# Patient Record
Sex: Female | Born: 1935 | Race: Black or African American | Hispanic: No | Smoking: Never smoker
Health system: Southern US, Community
[De-identification: ages and names within clinical notes are randomized; demographics above are authoritative.]

## PROBLEM LIST (undated history)

## (undated) DIAGNOSIS — I10 Essential (primary) hypertension: Secondary | ICD-10-CM

## (undated) DIAGNOSIS — E119 Type 2 diabetes mellitus without complications: Secondary | ICD-10-CM

## (undated) DIAGNOSIS — F0390 Unspecified dementia without behavioral disturbance: Secondary | ICD-10-CM

## (undated) HISTORY — PX: THYROIDECTOMY: SHX17

---

## 2005-06-16 ENCOUNTER — Ambulatory Visit: Payer: Self-pay | Admitting: Internal Medicine

## 2008-02-28 ENCOUNTER — Ambulatory Visit: Payer: Self-pay | Admitting: Internal Medicine

## 2008-11-13 ENCOUNTER — Ambulatory Visit: Payer: Self-pay | Admitting: Internal Medicine

## 2008-11-24 ENCOUNTER — Ambulatory Visit: Payer: Self-pay | Admitting: Unknown Physician Specialty

## 2009-03-03 ENCOUNTER — Ambulatory Visit: Payer: Self-pay | Admitting: Internal Medicine

## 2010-04-21 ENCOUNTER — Ambulatory Visit: Payer: Self-pay | Admitting: Internal Medicine

## 2011-05-26 ENCOUNTER — Ambulatory Visit: Payer: Self-pay | Admitting: Internal Medicine

## 2011-10-21 ENCOUNTER — Emergency Department: Payer: Self-pay | Admitting: Emergency Medicine

## 2012-02-27 ENCOUNTER — Emergency Department: Payer: Self-pay | Admitting: Emergency Medicine

## 2012-02-27 LAB — URINALYSIS, COMPLETE
Bilirubin,UR: NEGATIVE
Ketone: NEGATIVE
Nitrite: NEGATIVE
Protein: NEGATIVE
RBC,UR: 1 /HPF (ref 0–5)
Specific Gravity: 1.003 (ref 1.003–1.030)
Squamous Epithelial: <1
WBC UR: 5 /HPF (ref 0–5)

## 2012-06-20 ENCOUNTER — Ambulatory Visit: Payer: Self-pay

## 2014-06-30 ENCOUNTER — Emergency Department: Payer: Self-pay | Admitting: Emergency Medicine

## 2014-06-30 LAB — CBC WITH DIFFERENTIAL/PLATELET
Basophil #: 0 10*3/uL (ref 0.0–0.1)
Basophil %: 0.8 %
Eosinophil #: 0.1 10*3/uL (ref 0.0–0.7)
Eosinophil %: 2.5 %
HCT: 39 % (ref 35.0–47.0)
HGB: 12.7 g/dL (ref 12.0–16.0)
LYMPHS ABS: 2.9 10*3/uL (ref 1.0–3.6)
Lymphocyte %: 54.9 %
MCH: 28.8 pg (ref 26.0–34.0)
MCHC: 32.5 g/dL (ref 32.0–36.0)
MCV: 89 fL (ref 80–100)
MONOS PCT: 7.6 %
Monocyte #: 0.4 x10 3/mm (ref 0.2–0.9)
NEUTROS ABS: 1.8 10*3/uL (ref 1.4–6.5)
Neutrophil %: 34.2 %
Platelet: 252 10*3/uL (ref 150–440)
RBC: 4.41 10*6/uL (ref 3.80–5.20)
RDW: 13.9 % (ref 11.5–14.5)
WBC: 5.3 10*3/uL (ref 3.6–11.0)

## 2014-06-30 LAB — URINALYSIS, COMPLETE
BACTERIA: NONE SEEN
Bilirubin,UR: NEGATIVE
GLUCOSE, UR: NEGATIVE mg/dL (ref 0–75)
Ketone: NEGATIVE
Nitrite: NEGATIVE
Ph: 6 (ref 4.5–8.0)
Protein: NEGATIVE
RBC,UR: 1 /HPF (ref 0–5)
SPECIFIC GRAVITY: 1.011 (ref 1.003–1.030)

## 2014-06-30 LAB — BASIC METABOLIC PANEL
Anion Gap: 5 — ABNORMAL LOW (ref 7–16)
BUN: 13 mg/dL (ref 7–18)
CHLORIDE: 106 mmol/L (ref 98–107)
CO2: 28 mmol/L (ref 21–32)
CREATININE: 0.64 mg/dL (ref 0.60–1.30)
Calcium, Total: 9.1 mg/dL (ref 8.5–10.1)
Glucose: 153 mg/dL — ABNORMAL HIGH (ref 65–99)
Osmolality: 281 (ref 275–301)
Potassium: 4.1 mmol/L (ref 3.5–5.1)
Sodium: 139 mmol/L (ref 136–145)

## 2015-04-19 ENCOUNTER — Encounter: Payer: Self-pay | Admitting: Emergency Medicine

## 2015-04-19 ENCOUNTER — Emergency Department: Payer: Commercial Managed Care - HMO

## 2015-04-19 ENCOUNTER — Emergency Department
Admission: EM | Admit: 2015-04-19 | Discharge: 2015-04-19 | Disposition: A | Discharge status: Home / Self Care | Payer: Commercial Managed Care - HMO | Attending: Emergency Medicine | Admitting: Emergency Medicine

## 2015-04-19 DIAGNOSIS — E1165 Type 2 diabetes mellitus with hyperglycemia: Secondary | ICD-10-CM

## 2015-04-19 DIAGNOSIS — Y998 Other external cause status: Secondary | ICD-10-CM | POA: Diagnosis not present

## 2015-04-19 DIAGNOSIS — W57XXXA Bitten or stung by nonvenomous insect and other nonvenomous arthropods, initial encounter: Secondary | ICD-10-CM | POA: Diagnosis not present

## 2015-04-19 DIAGNOSIS — S70262A Insect bite (nonvenomous), left hip, initial encounter: Secondary | ICD-10-CM | POA: Insufficient documentation

## 2015-04-19 DIAGNOSIS — F0390 Unspecified dementia without behavioral disturbance: Secondary | ICD-10-CM | POA: Diagnosis not present

## 2015-04-19 DIAGNOSIS — Z7951 Long term (current) use of inhaled steroids: Secondary | ICD-10-CM | POA: Insufficient documentation

## 2015-04-19 DIAGNOSIS — Y9289 Other specified places as the place of occurrence of the external cause: Secondary | ICD-10-CM | POA: Diagnosis not present

## 2015-04-19 DIAGNOSIS — Y9389 Activity, other specified: Secondary | ICD-10-CM | POA: Insufficient documentation

## 2015-04-19 DIAGNOSIS — Z79899 Other long term (current) drug therapy: Secondary | ICD-10-CM | POA: Insufficient documentation

## 2015-04-19 DIAGNOSIS — I10 Essential (primary) hypertension: Secondary | ICD-10-CM | POA: Insufficient documentation

## 2015-04-19 DIAGNOSIS — E119 Type 2 diabetes mellitus without complications: Secondary | ICD-10-CM | POA: Insufficient documentation

## 2015-04-19 DIAGNOSIS — IMO0002 Reserved for concepts with insufficient information to code with codable children: Secondary | ICD-10-CM

## 2015-04-19 DIAGNOSIS — R410 Disorientation, unspecified: Secondary | ICD-10-CM | POA: Diagnosis not present

## 2015-04-19 DIAGNOSIS — Z791 Long term (current) use of non-steroidal anti-inflammatories (NSAID): Secondary | ICD-10-CM | POA: Insufficient documentation

## 2015-04-19 HISTORY — DX: Essential (primary) hypertension: I10

## 2015-04-19 HISTORY — DX: Type 2 diabetes mellitus without complications: E11.9

## 2015-04-19 LAB — COMPREHENSIVE METABOLIC PANEL
ALT: 19 U/L (ref 14–54)
AST: 23 U/L (ref 15–41)
Albumin: 3.9 g/dL (ref 3.5–5.0)
Alkaline Phosphatase: 54 U/L (ref 38–126)
Anion gap: 6 (ref 5–15)
BILIRUBIN TOTAL: 0.6 mg/dL (ref 0.3–1.2)
BUN: 16 mg/dL (ref 6–20)
CO2: 28 mmol/L (ref 22–32)
CREATININE: 0.54 mg/dL (ref 0.44–1.00)
Calcium: 9.3 mg/dL (ref 8.9–10.3)
Chloride: 105 mmol/L (ref 101–111)
GFR calc Af Amer: >60 mL/min (ref >60)
GFR calc non Af Amer: >60 mL/min (ref >60)
GLUCOSE: 219 mg/dL — AB (ref 65–99)
Potassium: 3.9 mmol/L (ref 3.5–5.1)
SODIUM: 139 mmol/L (ref 135–145)
Total Protein: 7.3 g/dL (ref 6.5–8.1)

## 2015-04-19 LAB — URINALYSIS COMPLETE WITH MICROSCOPIC (ARMC ONLY)
BILIRUBIN URINE: NEGATIVE
Bacteria, UA: NONE SEEN
Ketones, ur: NEGATIVE mg/dL
Nitrite: NEGATIVE
Protein, ur: NEGATIVE mg/dL
SPECIFIC GRAVITY, URINE: 1.021 (ref 1.005–1.030)
pH: 6 (ref 5.0–8.0)

## 2015-04-19 LAB — CBC WITH DIFFERENTIAL/PLATELET
BASOS PCT: 1 %
Basophils Absolute: 0.1 10*3/uL (ref 0–0.1)
Eosinophils Absolute: 0.2 10*3/uL (ref 0–0.7)
Eosinophils Relative: 3 %
HCT: 37.4 % (ref 35.0–47.0)
Hemoglobin: 12.2 g/dL (ref 12.0–16.0)
LYMPHS PCT: 42 %
Lymphs Abs: 2.4 10*3/uL (ref 1.0–3.6)
MCH: 28.5 pg (ref 26.0–34.0)
MCHC: 32.7 g/dL (ref 32.0–36.0)
MCV: 87.2 fL (ref 80.0–100.0)
Monocytes Absolute: 0.4 10*3/uL (ref 0.2–0.9)
Monocytes Relative: 7 %
Neutro Abs: 2.6 10*3/uL (ref 1.4–6.5)
Neutrophils Relative %: 47 %
Platelets: 273 10*3/uL (ref 150–440)
RBC: 4.29 MIL/uL (ref 3.80–5.20)
RDW: 14 % (ref 11.5–14.5)
WBC: 5.6 10*3/uL (ref 3.6–11.0)

## 2015-04-19 LAB — CK: Total CK: 124 U/L (ref 38–234)

## 2015-04-19 LAB — LIPASE, BLOOD: Lipase: 37 U/L (ref 22–51)

## 2015-04-19 MED ORDER — METFORMIN HCL 500 MG PO TABS: 500.0000 mg | ORAL_TABLET | Freq: Two times a day (BID) | ORAL | Status: DC

## 2015-04-19 MED ORDER — HYDROCORTISONE 1 % EX OINT: 1.0000 "application " | TOPICAL_OINTMENT | Freq: Two times a day (BID) | CUTANEOUS | Status: DC

## 2015-04-19 MED ORDER — MELOXICAM 7.5 MG PO TABS: 7.5000 mg | ORAL_TABLET | Freq: Every day | ORAL | Status: DC

## 2015-04-19 NOTE — ED Provider Notes (Signed)
[facility] Emergency Department Provider Note ____________________________________________  Time seen: Approximately 5:10 PM  I have reviewed the triage vital signs and the nursing notes.   HISTORY  Chief Complaint Insect Bite and Weakness   HPI Melanie Turner is a 81 y.o. female for evaluation of increase in forgetfulness, evaluation of blood sugar, and evaluation of a tick bite from 3-4 weeks ago. Her granddaughter is concerned that she is not taking any of her medication as prescribed and that her dementia is getting worse. Melanie Turner also complains of left hip pain that is stiff in the mornings and again at night.    Past Medical History  Diagnosis Date  . Diabetes mellitus without complication   . Hypertension     There are no active problems to display for this patient.   Past Surgical History  Procedure Laterality Date  . Thyroidectomy      Current Outpatient Rx  Name  Route  Sig  Dispense  Refill  . hydrocortisone 1 % ointment   Topical   Apply 1 application topically 2 (two) times daily.   30 g   0   . meloxicam (MOBIC) 7.5 MG tablet   Oral   Take 1 tablet (7.5 mg total) by mouth daily.   30 tablet   0   . metFORMIN (GLUCOPHAGE) 500 MG tablet   Oral   Take 1 tablet (500 mg total) by mouth 2 (two) times daily with a meal.   60 tablet   0     Allergies Review of patient's allergies indicates no known allergies.  History reviewed. No pertinent family history.  Social History History  Substance Use Topics  . Smoking status: Never Smoker   . Smokeless tobacco: Not on file  . Alcohol Use: Yes     Comment: occasional    Review of Systems Constitutional: No fever/chills Eyes: No visual changes. ENT: No sore throat. Cardiovascular: Denies chest pain. Respiratory: Denies shortness of breath. Gastrointestinal: No abdominal pain.  No nausea, no vomiting.  No diarrhea.  No constipation. Genitourinary: Negative for  dysuria. Musculoskeletal: Negative for back pain. Positive for left hip and leg pain. Skin: Negative for rash. Neurological: Negative for headaches, focal weakness or numbness.  10-point ROS otherwise negative.  ____________________________________________   PHYSICAL EXAM:  VITAL SIGNS: ED Triage Vitals  Enc Vitals Group     BP 04/19/15 1419 195/106 mmHg     Pulse Rate 04/19/15 1419 83     Resp 04/19/15 1419 20     Temp 04/19/15 1419 98.7 F (37.1 C)     Temp Source 04/19/15 1419 Oral     SpO2 04/19/15 1419 100 %     Weight --      Height 04/19/15 1419  (1.626 m)     Head Cir --      Peak Flow --      Pain Score 04/19/15 1649 0     Pain Loc --      Pain Edu? --      Excl. in GC? --     Constitutional: Alert and oriented, but unable to recall if she is currently taking any medications. She reports she is "forgetful."  Well appearing and in no acute distress. Eyes: Conjunctivae are normal. PERRL. EOMI. Head: Atraumatic. Nose: No congestion/rhinnorhea. Mouth/Throat: Mucous membranes are moist.  Oropharynx non-erythematous. Neck: No stridor.   Cardiovascular: Normal rate, regular rhythm. Grossly normal heart sounds.  Good peripheral circulation. Respiratory: Normal respiratory effort.  No retractions. Lungs CTAB. Gastrointestinal: Soft and nontender. No distention. No abdominal bruits. No CVA tenderness. Musculoskeletal: No lower extremity tenderness nor edema.  No joint effusions. Tender to palpation over left upper leg and hip without pinpoint bony tenderness. Atraumatic appearing. Neurologic:  Normal speech and language. No gross focal neurologic deficits are appreciated. Speech is normal. Ambulates with steady gait. Skin:  Skin is warm and dry. Small lesion with red center and mild induration present to left hip. No fluctuance or concern for cellulitis/infection. Psychiatric: Mood and affect are normal. Speech and behavior are normal. Flight of thoughts, but easily  redirected.  ____________________________________________   LABS (all labs ordered are listed, but only abnormal results are displayed)  Labs Reviewed  URINALYSIS COMPLETEWITH MICROSCOPIC ([facility] ONLY) - Abnormal; Notable for the following:    Color, Urine YELLOW (*)    APPearance CLEAR (*)    Glucose, UA >500 (*)    Hgb urine dipstick 1+ (*)    Leukocytes, UA TRACE (*)    Squamous Epithelial / LPF 0-5 (*)    All other components within normal limits  COMPREHENSIVE METABOLIC PANEL - Abnormal; Notable for the following:    Glucose, Bld 219 (*)    All other components within normal limits  CBC WITH DIFFERENTIAL/PLATELET  LIPASE, BLOOD  CK   ____________________________________________  EKG  ____________________________________________  RADIOLOGY  CT head negative for acute pathology. ____________________________________________   PROCEDURES  Procedure(s) performed: None  Critical Care performed: No  ____________________________________________   INITIAL IMPRESSION / ASSESSMENT AND PLAN / ED COURSE  Pertinent labs & imaging results that were available during my care of the patient were reviewed by me and considered in my medical decision making (see chart for details).  CT and labs discussed with patient and family. Patient was encouraged to take her medications as prescribed without skipping doses. Her family plan to help her re-establish a primary care provider on Monday and remind her to take her medications. They will also discuss dementia care/treatment. ____________________________________________   FINAL CLINICAL IMPRESSION(S) / ED DIAGNOSES  Final diagnoses:  Diabetes type 2, uncontrolled  Tick bite of hip, left, initial encounter  Dementia, without behavioral disturbance      Chinita Pester, FNP 04/19/15 1741  Governor Rooks, MD 04/20/15 1447

## 2015-04-19 NOTE — ED Notes (Signed)
Pt arrived via POV from home. Per grand-daughter patient removed tic 3-4 weeks ago and is unsure if all of it was out. Per family member, pt seems to "be in early dementia", and would like her diabetes to be addressed.

## 2015-05-06 DIAGNOSIS — E780 Pure hypercholesterolemia: Secondary | ICD-10-CM | POA: Diagnosis not present

## 2015-05-06 DIAGNOSIS — I10 Essential (primary) hypertension: Secondary | ICD-10-CM | POA: Diagnosis not present

## 2015-05-06 DIAGNOSIS — R413 Other amnesia: Secondary | ICD-10-CM | POA: Diagnosis not present

## 2015-05-06 DIAGNOSIS — E1122 Type 2 diabetes mellitus with diabetic chronic kidney disease: Secondary | ICD-10-CM | POA: Diagnosis not present

## 2015-05-08 DIAGNOSIS — E780 Pure hypercholesterolemia: Secondary | ICD-10-CM | POA: Diagnosis not present

## 2015-05-08 DIAGNOSIS — E1122 Type 2 diabetes mellitus with diabetic chronic kidney disease: Secondary | ICD-10-CM | POA: Diagnosis not present

## 2015-05-08 DIAGNOSIS — R413 Other amnesia: Secondary | ICD-10-CM | POA: Diagnosis not present

## 2015-05-08 DIAGNOSIS — N182 Chronic kidney disease, stage 2 (mild): Secondary | ICD-10-CM | POA: Diagnosis not present

## 2015-09-28 ENCOUNTER — Emergency Department: Payer: Commercial Managed Care - HMO

## 2015-09-28 ENCOUNTER — Encounter: Payer: Self-pay | Admitting: Medical Oncology

## 2015-09-28 ENCOUNTER — Emergency Department
Admission: EM | Admit: 2015-09-28 | Discharge: 2015-09-28 | Disposition: A | Discharge status: Home / Self Care | Payer: Commercial Managed Care - HMO | Attending: Emergency Medicine | Admitting: Emergency Medicine

## 2015-09-28 DIAGNOSIS — S30810A Abrasion of lower back and pelvis, initial encounter: Secondary | ICD-10-CM | POA: Diagnosis not present

## 2015-09-28 DIAGNOSIS — I10 Essential (primary) hypertension: Secondary | ICD-10-CM | POA: Diagnosis not present

## 2015-09-28 DIAGNOSIS — Y92828 Other wilderness area as the place of occurrence of the external cause: Secondary | ICD-10-CM | POA: Insufficient documentation

## 2015-09-28 DIAGNOSIS — S300XXA Contusion of lower back and pelvis, initial encounter: Secondary | ICD-10-CM | POA: Diagnosis not present

## 2015-09-28 DIAGNOSIS — Y998 Other external cause status: Secondary | ICD-10-CM | POA: Diagnosis not present

## 2015-09-28 DIAGNOSIS — S299XXA Unspecified injury of thorax, initial encounter: Secondary | ICD-10-CM | POA: Diagnosis not present

## 2015-09-28 DIAGNOSIS — S80812A Abrasion, left lower leg, initial encounter: Secondary | ICD-10-CM | POA: Insufficient documentation

## 2015-09-28 DIAGNOSIS — S199XXA Unspecified injury of neck, initial encounter: Secondary | ICD-10-CM | POA: Insufficient documentation

## 2015-09-28 DIAGNOSIS — T148XXA Other injury of unspecified body region, initial encounter: Secondary | ICD-10-CM

## 2015-09-28 DIAGNOSIS — S0990XA Unspecified injury of head, initial encounter: Secondary | ICD-10-CM | POA: Diagnosis not present

## 2015-09-28 DIAGNOSIS — M542 Cervicalgia: Secondary | ICD-10-CM | POA: Diagnosis not present

## 2015-09-28 DIAGNOSIS — E119 Type 2 diabetes mellitus without complications: Secondary | ICD-10-CM | POA: Insufficient documentation

## 2015-09-28 DIAGNOSIS — Y9389 Activity, other specified: Secondary | ICD-10-CM | POA: Diagnosis not present

## 2015-09-28 DIAGNOSIS — T148 Other injury of unspecified body region: Secondary | ICD-10-CM | POA: Insufficient documentation

## 2015-09-28 DIAGNOSIS — M25559 Pain in unspecified hip: Secondary | ICD-10-CM | POA: Diagnosis not present

## 2015-09-28 NOTE — Discharge Instructions (Signed)

## 2015-09-28 NOTE — ED Notes (Signed)
Patient is back from imaging. 
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
## 2016-03-23 DIAGNOSIS — R413 Other amnesia: Secondary | ICD-10-CM | POA: Diagnosis not present

## 2016-03-23 DIAGNOSIS — I10 Essential (primary) hypertension: Secondary | ICD-10-CM | POA: Diagnosis not present

## 2016-03-23 DIAGNOSIS — E7800 Pure hypercholesterolemia, unspecified: Secondary | ICD-10-CM | POA: Diagnosis not present

## 2016-03-23 DIAGNOSIS — N182 Chronic kidney disease, stage 2 (mild): Secondary | ICD-10-CM | POA: Diagnosis not present

## 2016-03-23 DIAGNOSIS — E1122 Type 2 diabetes mellitus with diabetic chronic kidney disease: Secondary | ICD-10-CM | POA: Diagnosis not present

## 2016-06-29 DIAGNOSIS — R413 Other amnesia: Secondary | ICD-10-CM | POA: Diagnosis not present

## 2016-06-29 DIAGNOSIS — E538 Deficiency of other specified B group vitamins: Secondary | ICD-10-CM | POA: Diagnosis not present

## 2016-06-29 DIAGNOSIS — I10 Essential (primary) hypertension: Secondary | ICD-10-CM | POA: Diagnosis not present

## 2016-06-29 DIAGNOSIS — Z23 Encounter for immunization: Secondary | ICD-10-CM | POA: Diagnosis not present

## 2016-06-29 DIAGNOSIS — E7800 Pure hypercholesterolemia, unspecified: Secondary | ICD-10-CM | POA: Diagnosis not present

## 2016-06-29 DIAGNOSIS — Z0000 Encounter for general adult medical examination without abnormal findings: Secondary | ICD-10-CM | POA: Diagnosis not present

## 2016-08-10 DIAGNOSIS — E538 Deficiency of other specified B group vitamins: Secondary | ICD-10-CM | POA: Diagnosis not present

## 2016-09-14 DIAGNOSIS — E538 Deficiency of other specified B group vitamins: Secondary | ICD-10-CM | POA: Diagnosis not present

## 2016-09-14 DIAGNOSIS — N182 Chronic kidney disease, stage 2 (mild): Secondary | ICD-10-CM | POA: Diagnosis not present

## 2016-09-14 DIAGNOSIS — R413 Other amnesia: Secondary | ICD-10-CM | POA: Diagnosis not present

## 2016-09-14 DIAGNOSIS — E1122 Type 2 diabetes mellitus with diabetic chronic kidney disease: Secondary | ICD-10-CM | POA: Diagnosis not present

## 2016-09-14 DIAGNOSIS — I10 Essential (primary) hypertension: Secondary | ICD-10-CM | POA: Diagnosis not present

## 2016-09-14 DIAGNOSIS — E7800 Pure hypercholesterolemia, unspecified: Secondary | ICD-10-CM | POA: Diagnosis not present

## 2016-11-24 DIAGNOSIS — M25561 Pain in right knee: Secondary | ICD-10-CM | POA: Diagnosis not present

## 2016-11-24 DIAGNOSIS — E7800 Pure hypercholesterolemia, unspecified: Secondary | ICD-10-CM | POA: Diagnosis not present

## 2016-11-24 DIAGNOSIS — N182 Chronic kidney disease, stage 2 (mild): Secondary | ICD-10-CM | POA: Diagnosis not present

## 2016-11-24 DIAGNOSIS — E1122 Type 2 diabetes mellitus with diabetic chronic kidney disease: Secondary | ICD-10-CM | POA: Diagnosis not present

## 2016-12-05 DIAGNOSIS — M25861 Other specified joint disorders, right knee: Secondary | ICD-10-CM | POA: Diagnosis not present

## 2016-12-05 DIAGNOSIS — M25561 Pain in right knee: Secondary | ICD-10-CM | POA: Diagnosis not present

## 2016-12-08 ENCOUNTER — Other Ambulatory Visit: Payer: Self-pay | Admitting: Orthopedic Surgery

## 2016-12-09 ENCOUNTER — Other Ambulatory Visit: Payer: Self-pay | Admitting: Orthopedic Surgery

## 2016-12-09 DIAGNOSIS — R2241 Localized swelling, mass and lump, right lower limb: Secondary | ICD-10-CM

## 2016-12-13 DIAGNOSIS — M25861 Other specified joint disorders, right knee: Secondary | ICD-10-CM | POA: Diagnosis not present

## 2016-12-20 ENCOUNTER — Ambulatory Visit
Admission: RE | Admit: 2016-12-20 | Discharge: 2016-12-20 | Disposition: A | Discharge status: Home / Self Care | Payer: Medicare HMO | Source: Ambulatory Visit | Attending: Orthopedic Surgery | Admitting: Orthopedic Surgery

## 2016-12-20 DIAGNOSIS — M25861 Other specified joint disorders, right knee: Secondary | ICD-10-CM | POA: Diagnosis not present

## 2016-12-20 DIAGNOSIS — R2241 Localized swelling, mass and lump, right lower limb: Secondary | ICD-10-CM

## 2016-12-20 DIAGNOSIS — M25461 Effusion, right knee: Secondary | ICD-10-CM | POA: Insufficient documentation

## 2016-12-20 MED ORDER — GADOBENATE DIMEGLUMINE 529 MG/ML IV SOLN
15.0000 mL | Freq: Once | INTRAVENOUS | Status: AC | PRN
  Administered 2016-12-20: 15 mL via INTRAVENOUS

## 2017-01-23 DIAGNOSIS — M1711 Unilateral primary osteoarthritis, right knee: Secondary | ICD-10-CM | POA: Diagnosis not present

## 2017-03-15 DIAGNOSIS — I10 Essential (primary) hypertension: Secondary | ICD-10-CM | POA: Diagnosis not present

## 2017-03-15 DIAGNOSIS — E1122 Type 2 diabetes mellitus with diabetic chronic kidney disease: Secondary | ICD-10-CM | POA: Diagnosis not present

## 2017-03-15 DIAGNOSIS — N182 Chronic kidney disease, stage 2 (mild): Secondary | ICD-10-CM | POA: Diagnosis not present

## 2017-03-15 DIAGNOSIS — E7800 Pure hypercholesterolemia, unspecified: Secondary | ICD-10-CM | POA: Diagnosis not present

## 2017-03-15 DIAGNOSIS — E538 Deficiency of other specified B group vitamins: Secondary | ICD-10-CM | POA: Diagnosis not present

## 2017-06-29 DIAGNOSIS — E538 Deficiency of other specified B group vitamins: Secondary | ICD-10-CM | POA: Diagnosis not present

## 2017-06-29 DIAGNOSIS — E1122 Type 2 diabetes mellitus with diabetic chronic kidney disease: Secondary | ICD-10-CM | POA: Diagnosis not present

## 2017-06-29 DIAGNOSIS — F0390 Unspecified dementia without behavioral disturbance: Secondary | ICD-10-CM | POA: Diagnosis not present

## 2017-06-29 DIAGNOSIS — I10 Essential (primary) hypertension: Secondary | ICD-10-CM | POA: Diagnosis not present

## 2017-06-29 DIAGNOSIS — E7800 Pure hypercholesterolemia, unspecified: Secondary | ICD-10-CM | POA: Diagnosis not present

## 2017-06-29 DIAGNOSIS — N182 Chronic kidney disease, stage 2 (mild): Secondary | ICD-10-CM | POA: Diagnosis not present

## 2017-06-29 DIAGNOSIS — Z0000 Encounter for general adult medical examination without abnormal findings: Secondary | ICD-10-CM | POA: Diagnosis not present

## 2017-10-26 DIAGNOSIS — N182 Chronic kidney disease, stage 2 (mild): Secondary | ICD-10-CM | POA: Diagnosis not present

## 2017-10-26 DIAGNOSIS — F0390 Unspecified dementia without behavioral disturbance: Secondary | ICD-10-CM | POA: Diagnosis not present

## 2017-10-26 DIAGNOSIS — E538 Deficiency of other specified B group vitamins: Secondary | ICD-10-CM | POA: Diagnosis not present

## 2017-10-26 DIAGNOSIS — E1122 Type 2 diabetes mellitus with diabetic chronic kidney disease: Secondary | ICD-10-CM | POA: Diagnosis not present

## 2017-10-26 DIAGNOSIS — E7800 Pure hypercholesterolemia, unspecified: Secondary | ICD-10-CM | POA: Diagnosis not present

## 2017-10-26 DIAGNOSIS — I10 Essential (primary) hypertension: Secondary | ICD-10-CM | POA: Diagnosis not present

## 2018-02-22 DIAGNOSIS — I10 Essential (primary) hypertension: Secondary | ICD-10-CM | POA: Diagnosis not present

## 2018-02-22 DIAGNOSIS — F0390 Unspecified dementia without behavioral disturbance: Secondary | ICD-10-CM | POA: Diagnosis not present

## 2018-02-22 DIAGNOSIS — E441 Mild protein-calorie malnutrition: Secondary | ICD-10-CM | POA: Diagnosis not present

## 2018-02-22 DIAGNOSIS — E7800 Pure hypercholesterolemia, unspecified: Secondary | ICD-10-CM | POA: Diagnosis not present

## 2018-02-22 DIAGNOSIS — N182 Chronic kidney disease, stage 2 (mild): Secondary | ICD-10-CM | POA: Diagnosis not present

## 2018-02-22 DIAGNOSIS — E1122 Type 2 diabetes mellitus with diabetic chronic kidney disease: Secondary | ICD-10-CM | POA: Diagnosis not present

## 2018-06-26 DIAGNOSIS — I10 Essential (primary) hypertension: Secondary | ICD-10-CM | POA: Diagnosis not present

## 2018-06-26 DIAGNOSIS — N182 Chronic kidney disease, stage 2 (mild): Secondary | ICD-10-CM | POA: Diagnosis not present

## 2018-06-26 DIAGNOSIS — E441 Mild protein-calorie malnutrition: Secondary | ICD-10-CM | POA: Diagnosis not present

## 2018-06-26 DIAGNOSIS — E7800 Pure hypercholesterolemia, unspecified: Secondary | ICD-10-CM | POA: Diagnosis not present

## 2018-06-26 DIAGNOSIS — F0390 Unspecified dementia without behavioral disturbance: Secondary | ICD-10-CM | POA: Diagnosis not present

## 2018-06-26 DIAGNOSIS — Z0000 Encounter for general adult medical examination without abnormal findings: Secondary | ICD-10-CM | POA: Diagnosis not present

## 2018-06-26 DIAGNOSIS — E1122 Type 2 diabetes mellitus with diabetic chronic kidney disease: Secondary | ICD-10-CM | POA: Diagnosis not present

## 2018-06-26 DIAGNOSIS — Z9181 History of falling: Secondary | ICD-10-CM | POA: Diagnosis not present

## 2018-09-05 ENCOUNTER — Emergency Department
Admission: EM | Admit: 2018-09-05 | Discharge: 2018-09-05 | Disposition: A | Discharge status: Home / Self Care | Payer: Medicare HMO | Attending: Emergency Medicine | Admitting: Emergency Medicine

## 2018-09-05 ENCOUNTER — Emergency Department: Payer: Medicare HMO

## 2018-09-05 ENCOUNTER — Other Ambulatory Visit: Payer: Self-pay

## 2018-09-05 ENCOUNTER — Encounter: Payer: Self-pay | Admitting: Emergency Medicine

## 2018-09-05 DIAGNOSIS — S01112A Laceration without foreign body of left eyelid and periocular area, initial encounter: Secondary | ICD-10-CM | POA: Diagnosis not present

## 2018-09-05 DIAGNOSIS — I10 Essential (primary) hypertension: Secondary | ICD-10-CM | POA: Insufficient documentation

## 2018-09-05 DIAGNOSIS — Y92009 Unspecified place in unspecified non-institutional (private) residence as the place of occurrence of the external cause: Secondary | ICD-10-CM | POA: Insufficient documentation

## 2018-09-05 DIAGNOSIS — S0003XA Contusion of scalp, initial encounter: Secondary | ICD-10-CM | POA: Diagnosis not present

## 2018-09-05 DIAGNOSIS — S51012A Laceration without foreign body of left elbow, initial encounter: Secondary | ICD-10-CM | POA: Diagnosis not present

## 2018-09-05 DIAGNOSIS — W1830XA Fall on same level, unspecified, initial encounter: Secondary | ICD-10-CM | POA: Insufficient documentation

## 2018-09-05 DIAGNOSIS — S299XXA Unspecified injury of thorax, initial encounter: Secondary | ICD-10-CM | POA: Diagnosis not present

## 2018-09-05 DIAGNOSIS — S0012XA Contusion of left eyelid and periocular area, initial encounter: Secondary | ICD-10-CM | POA: Diagnosis not present

## 2018-09-05 DIAGNOSIS — Y939 Activity, unspecified: Secondary | ICD-10-CM | POA: Insufficient documentation

## 2018-09-05 DIAGNOSIS — R000 Tachycardia, unspecified: Secondary | ICD-10-CM | POA: Diagnosis not present

## 2018-09-05 DIAGNOSIS — W19XXXA Unspecified fall, initial encounter: Secondary | ICD-10-CM

## 2018-09-05 DIAGNOSIS — Z7984 Long term (current) use of oral hypoglycemic drugs: Secondary | ICD-10-CM | POA: Diagnosis not present

## 2018-09-05 DIAGNOSIS — Z79899 Other long term (current) drug therapy: Secondary | ICD-10-CM | POA: Insufficient documentation

## 2018-09-05 DIAGNOSIS — R410 Disorientation, unspecified: Secondary | ICD-10-CM | POA: Diagnosis not present

## 2018-09-05 DIAGNOSIS — S0990XA Unspecified injury of head, initial encounter: Secondary | ICD-10-CM | POA: Diagnosis not present

## 2018-09-05 DIAGNOSIS — R296 Repeated falls: Secondary | ICD-10-CM | POA: Diagnosis not present

## 2018-09-05 DIAGNOSIS — E119 Type 2 diabetes mellitus without complications: Secondary | ICD-10-CM | POA: Insufficient documentation

## 2018-09-05 DIAGNOSIS — R531 Weakness: Secondary | ICD-10-CM | POA: Diagnosis not present

## 2018-09-05 DIAGNOSIS — Y999 Unspecified external cause status: Secondary | ICD-10-CM | POA: Diagnosis not present

## 2018-09-05 DIAGNOSIS — S199XXA Unspecified injury of neck, initial encounter: Secondary | ICD-10-CM | POA: Diagnosis not present

## 2018-09-05 LAB — CBC
HCT: 37.3 % (ref 36.0–46.0)
Hemoglobin: 12.2 g/dL (ref 12.0–15.0)
MCH: 29 pg (ref 26.0–34.0)
MCHC: 32.7 g/dL (ref 30.0–36.0)
MCV: 88.8 fL (ref 80.0–100.0)
PLATELETS: 304 10*3/uL (ref 150–400)
RBC: 4.2 MIL/uL (ref 3.87–5.11)
RDW: 13.3 % (ref 11.5–15.5)
WBC: 8.3 10*3/uL (ref 4.0–10.5)
nRBC: 0 % (ref 0.0–0.2)

## 2018-09-05 LAB — URINALYSIS, COMPLETE (UACMP) WITH MICROSCOPIC
Bacteria, UA: NONE SEEN
Bilirubin Urine: NEGATIVE
GLUCOSE, UA: 50 mg/dL — AB
Ketones, ur: 5 mg/dL — AB
Leukocytes, UA: NEGATIVE
NITRITE: NEGATIVE
Protein, ur: NEGATIVE mg/dL
SPECIFIC GRAVITY, URINE: 1.014 (ref 1.005–1.030)
Squamous Epithelial / LPF: NONE SEEN (ref 0–5)
pH: 5 (ref 5.0–8.0)

## 2018-09-05 LAB — TROPONIN I: Troponin I: <0.03 ng/mL (ref <0.03)

## 2018-09-05 LAB — BASIC METABOLIC PANEL
Anion gap: 12 (ref 5–15)
BUN: 22 mg/dL (ref 8–23)
CALCIUM: 10 mg/dL (ref 8.9–10.3)
CO2: 25 mmol/L (ref 22–32)
Chloride: 103 mmol/L (ref 98–111)
Creatinine, Ser: 0.65 mg/dL (ref 0.44–1.00)
GFR calc Af Amer: >60 mL/min (ref >60)
GFR calc non Af Amer: >60 mL/min (ref >60)
Glucose, Bld: 174 mg/dL — ABNORMAL HIGH (ref 70–99)
POTASSIUM: 3.5 mmol/L (ref 3.5–5.1)
Sodium: 140 mmol/L (ref 135–145)

## 2018-09-05 LAB — CK: Total CK: 204 U/L (ref 38–234)

## 2018-09-05 NOTE — ED Triage Notes (Signed)
Patient fell in yard this am.  She just says she falls sometimes.  She has a laceration on left forehead.  She was most likeley in yard all day until family came home.  She is normally able to walk without difficulty, but today cant stand up.  She also had several episodes of incontinent diarrhea last night and today.

## 2018-09-05 NOTE — ED Notes (Signed)
Reviewed discharge instructions, follow-up care with patient. Patient verbalized understanding of all information reviewed. Patient stable, with no distress noted at this time.    

## 2018-09-05 NOTE — ED Provider Notes (Signed)
[facility] Emergency Department Provider Note  Time seen: 7:29 PM  I have reviewed the triage vital signs and the nursing notes.   HISTORY  Chief Complaint Fall; Laceration; and Weakness    HPI Melanie Turner is a 84 y.o. female with a past medical history of diabetes, hypertension, presents to the emergency department after a fall, confusion.  According to family patient has mild dementia, lives with family member, states she normally is able to walk with the use of a walker, today has been very confused, more weak unable to walk unassisted.  Family found her to have fallen with a laceration to her left eyebrow, patient cannot recall if she fell or how she fell.  Family states the patient has mild dementia at baseline would not be oriented to place or time necessarily, but otherwise will converse normally able to walk, talk, with minimal assistance.  Family states today confusion is much worse, she is very weak had a fall involving her head.   Past Medical History:  Diagnosis Date  . Diabetes mellitus without complication (HCC)   . Hypertension     There are no active problems to display for this patient.   Past Surgical History:  Procedure Laterality Date  . THYROIDECTOMY      Prior to Admission medications   Medication Sig Start Date End Date Taking? Authorizing Provider  hydrocortisone 1 % ointment Apply 1 application topically 2 (two) times daily. 04/19/15   Triplett, Cari B, FNP  losartan-hydrochlorothiazide (HYZAAR) 100-12.5 MG tablet Take 1 tablet by mouth daily.    [provider]  meloxicam (MOBIC) 7.5 MG tablet Take 1 tablet (7.5 mg total) by mouth daily. 04/19/15   Triplett, Rulon Eisenmenger B, FNP  metFORMIN (GLUCOPHAGE) 500 MG tablet Take 1 tablet (500 mg total) by mouth 2 (two) times daily with a meal. Patient taking differently: Take 500 mg by mouth every evening.  04/19/15   Triplett, Rulon Eisenmenger B, FNP    No Known Allergies  No family history on  file.  Social History Social History   Tobacco Use  . Smoking status: Never Smoker  . Smokeless tobacco: Never Used  Substance Use Topics  . Alcohol use: Yes    Comment: occasional  . Drug use: No    Review of Systems Unable to obtain an adequate/accurate review of systems secondary to baseline dementia and worsening confusion. ____________________________________________   PHYSICAL EXAM:  VITAL SIGNS: ED Triage Vitals  Enc Vitals Group     BP 09/05/18 1824 (!) 196/88     Pulse Rate 09/05/18 1824 95     Resp 09/05/18 1824 16     Temp 09/05/18 1824 98.3 F (36.8 C)     Temp Source 09/05/18 1824 Oral     SpO2 09/05/18 1824 100 %     Weight 09/05/18 1826 120 lb (54.4 kg)     Height --      Head Circumference --      Peak Flow --      Pain Score 09/05/18 1824 0     Pain Loc --      Pain Edu? --      Excl. in GC? --    Constitutional: The patient is awake alert, pleasant, no distress. Eyes: Normal exam ENT   Head: Small hematoma to left eyebrow with approximately 2 semi-laceration vertically through left eyebrow.   Mouth/Throat: Mucous membranes are moist. Cardiovascular: Normal rate, regular rhythm.  Respiratory: Normal respiratory effort without tachypnea nor retractions.  Breath sounds are clear  Gastrointestinal: Soft and nontender. No distention.   Musculoskeletal: Nontender with normal range of motion in all extremities.  Neurologic:  Normal speech and language. No gross focal neurologic deficits Skin:  Skin is warm, dry.  2 cm laceration to left eyebrow/forehead as described above.  Hemostatic. Psychiatric: Mood and affect are normal.   ____________________________________________    EKG  EKG reviewed and interpreted by myself shows sinus tachycardia 104 bpm with a narrow QRS, normal axis, largely normal intervals nonspecific ST changes.  ____________________________________________    RADIOLOGY  CT head/C-spine negative. Chest x-ray  negative  ____________________________________________   INITIAL IMPRESSION / ASSESSMENT AND PLAN / ED COURSE  Pertinent labs & imaging results that were available during my care of the patient were reviewed by me and considered in my medical decision making (see chart for details).  Patient presents to the emergency department with increased confusion, weakness, fall with forehead injury and laceration.  Differential would include infectious etiologies such as urinary tract infection, metabolic or electrolyte abnormality, ICH.  We will check labs including cardiac enzymes, will obtain a urinalysis obtain CT imaging of the head and C-spine as a precaution.  Overall the patient appears extremely well, has no pain complaints whatsoever on exam great range of motion all extremities.  Patient's work-up is largely negative, labs reassuring, urinalysis is clean.  CT scan of the head and neck as well as chest x-ray are negative.  Overall patient appears well, is alert, talkative, no distress.  Family is here and will take her home.  After cleaning the laceration and all the dried blood laceration is very small proximal he 1 cm and is hemostatic.  We will repair with Dermabond it is a non-gaping wound.  Family agreeable to plan of care. ____________________________________________   FINAL CLINICAL IMPRESSION(S) / ED DIAGNOSES  Fall Head injury Laceration Confusion Weakness    Minna Antis, MD 09/05/18 2130

## 2018-09-05 NOTE — ED Notes (Signed)
Band aid applied to abrasion right wrist. Patient cleaned, and new incontinence brief placed on patient.

## 2018-09-05 NOTE — Discharge Instructions (Addendum)
Please follow-up with your primary care doctor in the next several days for recheck/reevaluation.  Your work-up today has shown largely normal results.  Please take Tylenol as needed for discomfort as written on the box.  Return to the emergency department for any worsening pain, worsening weakness, or any other symptom personally concerning to yourself.

## 2018-09-05 NOTE — ED Notes (Signed)
Mayra NT performed in and out cath with this RN as witness per MD order

## 2018-09-05 NOTE — ED Notes (Signed)
Patient's laceration cleaned with sterile saline per MD [facility] request/order

## 2018-10-30 DIAGNOSIS — E538 Deficiency of other specified B group vitamins: Secondary | ICD-10-CM | POA: Diagnosis not present

## 2018-10-30 DIAGNOSIS — F0390 Unspecified dementia without behavioral disturbance: Secondary | ICD-10-CM | POA: Diagnosis not present

## 2018-10-30 DIAGNOSIS — E7800 Pure hypercholesterolemia, unspecified: Secondary | ICD-10-CM | POA: Diagnosis not present

## 2018-10-30 DIAGNOSIS — Z9181 History of falling: Secondary | ICD-10-CM | POA: Diagnosis not present

## 2018-10-30 DIAGNOSIS — E1122 Type 2 diabetes mellitus with diabetic chronic kidney disease: Secondary | ICD-10-CM | POA: Diagnosis not present

## 2018-10-30 DIAGNOSIS — E441 Mild protein-calorie malnutrition: Secondary | ICD-10-CM | POA: Diagnosis not present

## 2018-10-30 DIAGNOSIS — N182 Chronic kidney disease, stage 2 (mild): Secondary | ICD-10-CM | POA: Diagnosis not present

## 2018-10-30 DIAGNOSIS — Z993 Dependence on wheelchair: Secondary | ICD-10-CM | POA: Diagnosis not present

## 2018-10-30 DIAGNOSIS — I10 Essential (primary) hypertension: Secondary | ICD-10-CM | POA: Diagnosis not present

## 2018-11-08 DIAGNOSIS — E1122 Type 2 diabetes mellitus with diabetic chronic kidney disease: Secondary | ICD-10-CM | POA: Diagnosis not present

## 2018-11-08 DIAGNOSIS — Z79899 Other long term (current) drug therapy: Secondary | ICD-10-CM | POA: Diagnosis not present

## 2018-11-08 DIAGNOSIS — N182 Chronic kidney disease, stage 2 (mild): Secondary | ICD-10-CM | POA: Diagnosis not present

## 2018-11-08 DIAGNOSIS — R482 Apraxia: Secondary | ICD-10-CM | POA: Diagnosis not present

## 2018-11-08 DIAGNOSIS — E43 Unspecified severe protein-calorie malnutrition: Secondary | ICD-10-CM | POA: Diagnosis not present

## 2018-11-08 DIAGNOSIS — I129 Hypertensive chronic kidney disease with stage 1 through stage 4 chronic kidney disease, or unspecified chronic kidney disease: Secondary | ICD-10-CM | POA: Diagnosis not present

## 2018-11-08 DIAGNOSIS — Z9181 History of falling: Secondary | ICD-10-CM | POA: Diagnosis not present

## 2018-11-08 DIAGNOSIS — F0391 Unspecified dementia with behavioral disturbance: Secondary | ICD-10-CM | POA: Diagnosis not present

## 2018-11-16 DIAGNOSIS — E1122 Type 2 diabetes mellitus with diabetic chronic kidney disease: Secondary | ICD-10-CM | POA: Diagnosis not present

## 2018-11-16 DIAGNOSIS — R482 Apraxia: Secondary | ICD-10-CM | POA: Diagnosis not present

## 2018-11-16 DIAGNOSIS — F0391 Unspecified dementia with behavioral disturbance: Secondary | ICD-10-CM | POA: Diagnosis not present

## 2018-11-16 DIAGNOSIS — I129 Hypertensive chronic kidney disease with stage 1 through stage 4 chronic kidney disease, or unspecified chronic kidney disease: Secondary | ICD-10-CM | POA: Diagnosis not present

## 2018-11-16 DIAGNOSIS — E43 Unspecified severe protein-calorie malnutrition: Secondary | ICD-10-CM | POA: Diagnosis not present

## 2018-11-16 DIAGNOSIS — N182 Chronic kidney disease, stage 2 (mild): Secondary | ICD-10-CM | POA: Diagnosis not present

## 2018-11-16 DIAGNOSIS — Z9181 History of falling: Secondary | ICD-10-CM | POA: Diagnosis not present

## 2018-11-16 DIAGNOSIS — Z79899 Other long term (current) drug therapy: Secondary | ICD-10-CM | POA: Diagnosis not present

## 2018-11-21 DIAGNOSIS — R482 Apraxia: Secondary | ICD-10-CM | POA: Diagnosis not present

## 2018-11-21 DIAGNOSIS — E43 Unspecified severe protein-calorie malnutrition: Secondary | ICD-10-CM | POA: Diagnosis not present

## 2018-11-21 DIAGNOSIS — Z79899 Other long term (current) drug therapy: Secondary | ICD-10-CM | POA: Diagnosis not present

## 2018-11-21 DIAGNOSIS — I129 Hypertensive chronic kidney disease with stage 1 through stage 4 chronic kidney disease, or unspecified chronic kidney disease: Secondary | ICD-10-CM | POA: Diagnosis not present

## 2018-11-21 DIAGNOSIS — Z9181 History of falling: Secondary | ICD-10-CM | POA: Diagnosis not present

## 2018-11-21 DIAGNOSIS — F0391 Unspecified dementia with behavioral disturbance: Secondary | ICD-10-CM | POA: Diagnosis not present

## 2018-11-21 DIAGNOSIS — N182 Chronic kidney disease, stage 2 (mild): Secondary | ICD-10-CM | POA: Diagnosis not present

## 2018-11-21 DIAGNOSIS — E1122 Type 2 diabetes mellitus with diabetic chronic kidney disease: Secondary | ICD-10-CM | POA: Diagnosis not present

## 2018-11-22 DIAGNOSIS — N182 Chronic kidney disease, stage 2 (mild): Secondary | ICD-10-CM | POA: Diagnosis not present

## 2018-11-22 DIAGNOSIS — R482 Apraxia: Secondary | ICD-10-CM | POA: Diagnosis not present

## 2018-11-22 DIAGNOSIS — E43 Unspecified severe protein-calorie malnutrition: Secondary | ICD-10-CM | POA: Diagnosis not present

## 2018-11-22 DIAGNOSIS — I129 Hypertensive chronic kidney disease with stage 1 through stage 4 chronic kidney disease, or unspecified chronic kidney disease: Secondary | ICD-10-CM | POA: Diagnosis not present

## 2018-11-22 DIAGNOSIS — E1122 Type 2 diabetes mellitus with diabetic chronic kidney disease: Secondary | ICD-10-CM | POA: Diagnosis not present

## 2018-11-22 DIAGNOSIS — F0391 Unspecified dementia with behavioral disturbance: Secondary | ICD-10-CM | POA: Diagnosis not present

## 2018-11-22 DIAGNOSIS — Z9181 History of falling: Secondary | ICD-10-CM | POA: Diagnosis not present

## 2018-11-22 DIAGNOSIS — Z79899 Other long term (current) drug therapy: Secondary | ICD-10-CM | POA: Diagnosis not present

## 2018-11-28 DIAGNOSIS — E43 Unspecified severe protein-calorie malnutrition: Secondary | ICD-10-CM | POA: Diagnosis not present

## 2018-11-28 DIAGNOSIS — Z79899 Other long term (current) drug therapy: Secondary | ICD-10-CM | POA: Diagnosis not present

## 2018-11-28 DIAGNOSIS — E1122 Type 2 diabetes mellitus with diabetic chronic kidney disease: Secondary | ICD-10-CM | POA: Diagnosis not present

## 2018-11-28 DIAGNOSIS — N182 Chronic kidney disease, stage 2 (mild): Secondary | ICD-10-CM | POA: Diagnosis not present

## 2018-11-28 DIAGNOSIS — R482 Apraxia: Secondary | ICD-10-CM | POA: Diagnosis not present

## 2018-11-28 DIAGNOSIS — Z9181 History of falling: Secondary | ICD-10-CM | POA: Diagnosis not present

## 2018-11-28 DIAGNOSIS — F0391 Unspecified dementia with behavioral disturbance: Secondary | ICD-10-CM | POA: Diagnosis not present

## 2018-11-28 DIAGNOSIS — I129 Hypertensive chronic kidney disease with stage 1 through stage 4 chronic kidney disease, or unspecified chronic kidney disease: Secondary | ICD-10-CM | POA: Diagnosis not present

## 2018-11-29 DIAGNOSIS — Z79899 Other long term (current) drug therapy: Secondary | ICD-10-CM | POA: Diagnosis not present

## 2018-11-29 DIAGNOSIS — E1122 Type 2 diabetes mellitus with diabetic chronic kidney disease: Secondary | ICD-10-CM | POA: Diagnosis not present

## 2018-11-29 DIAGNOSIS — N182 Chronic kidney disease, stage 2 (mild): Secondary | ICD-10-CM | POA: Diagnosis not present

## 2018-11-29 DIAGNOSIS — F0391 Unspecified dementia with behavioral disturbance: Secondary | ICD-10-CM | POA: Diagnosis not present

## 2018-11-29 DIAGNOSIS — R482 Apraxia: Secondary | ICD-10-CM | POA: Diagnosis not present

## 2018-11-29 DIAGNOSIS — E43 Unspecified severe protein-calorie malnutrition: Secondary | ICD-10-CM | POA: Diagnosis not present

## 2018-11-29 DIAGNOSIS — I129 Hypertensive chronic kidney disease with stage 1 through stage 4 chronic kidney disease, or unspecified chronic kidney disease: Secondary | ICD-10-CM | POA: Diagnosis not present

## 2018-11-29 DIAGNOSIS — Z9181 History of falling: Secondary | ICD-10-CM | POA: Diagnosis not present

## 2018-12-05 DIAGNOSIS — N182 Chronic kidney disease, stage 2 (mild): Secondary | ICD-10-CM | POA: Diagnosis not present

## 2018-12-05 DIAGNOSIS — Z79899 Other long term (current) drug therapy: Secondary | ICD-10-CM | POA: Diagnosis not present

## 2018-12-05 DIAGNOSIS — Z9181 History of falling: Secondary | ICD-10-CM | POA: Diagnosis not present

## 2018-12-05 DIAGNOSIS — F0391 Unspecified dementia with behavioral disturbance: Secondary | ICD-10-CM | POA: Diagnosis not present

## 2018-12-05 DIAGNOSIS — E1122 Type 2 diabetes mellitus with diabetic chronic kidney disease: Secondary | ICD-10-CM | POA: Diagnosis not present

## 2018-12-05 DIAGNOSIS — I129 Hypertensive chronic kidney disease with stage 1 through stage 4 chronic kidney disease, or unspecified chronic kidney disease: Secondary | ICD-10-CM | POA: Diagnosis not present

## 2018-12-05 DIAGNOSIS — E43 Unspecified severe protein-calorie malnutrition: Secondary | ICD-10-CM | POA: Diagnosis not present

## 2018-12-05 DIAGNOSIS — R482 Apraxia: Secondary | ICD-10-CM | POA: Diagnosis not present

## 2018-12-06 DIAGNOSIS — F0390 Unspecified dementia without behavioral disturbance: Secondary | ICD-10-CM | POA: Diagnosis not present

## 2018-12-06 DIAGNOSIS — Z79899 Other long term (current) drug therapy: Secondary | ICD-10-CM | POA: Diagnosis not present

## 2018-12-06 DIAGNOSIS — R482 Apraxia: Secondary | ICD-10-CM | POA: Diagnosis not present

## 2018-12-06 DIAGNOSIS — I10 Essential (primary) hypertension: Secondary | ICD-10-CM | POA: Diagnosis not present

## 2018-12-06 DIAGNOSIS — E43 Unspecified severe protein-calorie malnutrition: Secondary | ICD-10-CM | POA: Diagnosis not present

## 2018-12-06 DIAGNOSIS — Z9181 History of falling: Secondary | ICD-10-CM | POA: Diagnosis not present

## 2018-12-06 DIAGNOSIS — I129 Hypertensive chronic kidney disease with stage 1 through stage 4 chronic kidney disease, or unspecified chronic kidney disease: Secondary | ICD-10-CM | POA: Diagnosis not present

## 2018-12-06 DIAGNOSIS — N182 Chronic kidney disease, stage 2 (mild): Secondary | ICD-10-CM | POA: Diagnosis not present

## 2018-12-06 DIAGNOSIS — F0391 Unspecified dementia with behavioral disturbance: Secondary | ICD-10-CM | POA: Diagnosis not present

## 2018-12-06 DIAGNOSIS — E1122 Type 2 diabetes mellitus with diabetic chronic kidney disease: Secondary | ICD-10-CM | POA: Diagnosis not present

## 2019-03-08 ENCOUNTER — Inpatient Hospital Stay (HOSPITAL_COMMUNITY)
Admission: EM | Admit: 2019-03-08 | Discharge: 2019-03-14 | DRG: 100 | Disposition: A | Discharge status: Home Health | Payer: Medicare HMO | Attending: Internal Medicine | Admitting: Internal Medicine

## 2019-03-08 ENCOUNTER — Emergency Department (HOSPITAL_COMMUNITY): Payer: Medicare HMO

## 2019-03-08 ENCOUNTER — Other Ambulatory Visit: Payer: Self-pay

## 2019-03-08 ENCOUNTER — Encounter (HOSPITAL_COMMUNITY): Payer: Self-pay

## 2019-03-08 DIAGNOSIS — Z66 Do not resuscitate: Secondary | ICD-10-CM | POA: Diagnosis present

## 2019-03-08 DIAGNOSIS — Z7984 Long term (current) use of oral hypoglycemic drugs: Secondary | ICD-10-CM

## 2019-03-08 DIAGNOSIS — R569 Unspecified convulsions: Secondary | ICD-10-CM | POA: Diagnosis not present

## 2019-03-08 DIAGNOSIS — E876 Hypokalemia: Secondary | ICD-10-CM | POA: Diagnosis not present

## 2019-03-08 DIAGNOSIS — I161 Hypertensive emergency: Secondary | ICD-10-CM | POA: Diagnosis present

## 2019-03-08 DIAGNOSIS — Z03818 Encounter for observation for suspected exposure to other biological agents ruled out: Secondary | ICD-10-CM | POA: Diagnosis not present

## 2019-03-08 DIAGNOSIS — G92 Toxic encephalopathy: Secondary | ICD-10-CM

## 2019-03-08 DIAGNOSIS — F0390 Unspecified dementia without behavioral disturbance: Secondary | ICD-10-CM | POA: Diagnosis not present

## 2019-03-08 DIAGNOSIS — R4182 Altered mental status, unspecified: Secondary | ICD-10-CM | POA: Diagnosis not present

## 2019-03-08 DIAGNOSIS — R069 Unspecified abnormalities of breathing: Secondary | ICD-10-CM

## 2019-03-08 DIAGNOSIS — Z1159 Encounter for screening for other viral diseases: Secondary | ICD-10-CM

## 2019-03-08 DIAGNOSIS — E1165 Type 2 diabetes mellitus with hyperglycemia: Secondary | ICD-10-CM | POA: Diagnosis not present

## 2019-03-08 DIAGNOSIS — R29723 NIHSS score 23: Secondary | ICD-10-CM | POA: Diagnosis present

## 2019-03-08 DIAGNOSIS — R4020 Unspecified coma: Secondary | ICD-10-CM | POA: Diagnosis not present

## 2019-03-08 DIAGNOSIS — G9341 Metabolic encephalopathy: Secondary | ICD-10-CM | POA: Diagnosis present

## 2019-03-08 DIAGNOSIS — G40901 Epilepsy, unspecified, not intractable, with status epilepticus: Secondary | ICD-10-CM | POA: Diagnosis not present

## 2019-03-08 DIAGNOSIS — E1129 Type 2 diabetes mellitus with other diabetic kidney complication: Secondary | ICD-10-CM

## 2019-03-08 DIAGNOSIS — R404 Transient alteration of awareness: Secondary | ICD-10-CM | POA: Diagnosis not present

## 2019-03-08 DIAGNOSIS — Z781 Physical restraint status: Secondary | ICD-10-CM | POA: Diagnosis not present

## 2019-03-08 DIAGNOSIS — G40501 Epileptic seizures related to external causes, not intractable, with status epilepticus: Secondary | ICD-10-CM | POA: Diagnosis not present

## 2019-03-08 DIAGNOSIS — E119 Type 2 diabetes mellitus without complications: Secondary | ICD-10-CM

## 2019-03-08 DIAGNOSIS — R0689 Other abnormalities of breathing: Secondary | ICD-10-CM | POA: Diagnosis not present

## 2019-03-08 DIAGNOSIS — I6522 Occlusion and stenosis of left carotid artery: Secondary | ICD-10-CM | POA: Diagnosis not present

## 2019-03-08 DIAGNOSIS — I10 Essential (primary) hypertension: Secondary | ICD-10-CM | POA: Diagnosis present

## 2019-03-08 DIAGNOSIS — K5900 Constipation, unspecified: Secondary | ICD-10-CM | POA: Diagnosis not present

## 2019-03-08 DIAGNOSIS — I672 Cerebral atherosclerosis: Secondary | ICD-10-CM | POA: Diagnosis not present

## 2019-03-08 DIAGNOSIS — E872 Acidosis: Secondary | ICD-10-CM | POA: Diagnosis not present

## 2019-03-08 DIAGNOSIS — Z79899 Other long term (current) drug therapy: Secondary | ICD-10-CM

## 2019-03-08 DIAGNOSIS — G9340 Encephalopathy, unspecified: Secondary | ICD-10-CM | POA: Diagnosis not present

## 2019-03-08 DIAGNOSIS — G40401 Other generalized epilepsy and epileptic syndromes, not intractable, with status epilepticus: Principal | ICD-10-CM | POA: Diagnosis present

## 2019-03-08 LAB — CBG MONITORING, ED
Glucose-Capillary: 299 mg/dL — ABNORMAL HIGH (ref 70–99)
Glucose-Capillary: 373 mg/dL — ABNORMAL HIGH (ref 70–99)
Glucose-Capillary: 453 mg/dL — ABNORMAL HIGH (ref 70–99)
Glucose-Capillary: 475 mg/dL — ABNORMAL HIGH (ref 70–99)

## 2019-03-08 LAB — DIFFERENTIAL
Abs Immature Granulocytes: 0.01 10*3/uL (ref 0.00–0.07)
Basophils Absolute: 0.1 10*3/uL (ref 0.0–0.1)
Basophils Relative: 1 %
Eosinophils Absolute: 0 10*3/uL (ref 0.0–0.5)
Eosinophils Relative: 1 %
Immature Granulocytes: 0 %
Lymphocytes Relative: 37 %
Lymphs Abs: 3 10*3/uL (ref 0.7–4.0)
Monocytes Absolute: 0.5 10*3/uL (ref 0.1–1.0)
Monocytes Relative: 6 %
Neutro Abs: 4.6 10*3/uL (ref 1.7–7.7)
Neutrophils Relative %: 55 %

## 2019-03-08 LAB — CBC
HCT: 41.2 % (ref 36.0–46.0)
Hemoglobin: 13.2 g/dL (ref 12.0–15.0)
MCH: 28.4 pg (ref 26.0–34.0)
MCHC: 32 g/dL (ref 30.0–36.0)
MCV: 88.6 fL (ref 80.0–100.0)
Platelets: 267 10*3/uL (ref 150–400)
RBC: 4.65 MIL/uL (ref 3.87–5.11)
RDW: 12.8 % (ref 11.5–15.5)
WBC: 8.2 10*3/uL (ref 4.0–10.5)
nRBC: 0 % (ref 0.0–0.2)

## 2019-03-08 LAB — URINALYSIS, ROUTINE W REFLEX MICROSCOPIC
Bacteria, UA: NONE SEEN
Bilirubin Urine: NEGATIVE
Glucose, UA: >=500 mg/dL — AB
Hgb urine dipstick: NEGATIVE
Ketones, ur: NEGATIVE mg/dL
Leukocytes,Ua: NEGATIVE
Nitrite: NEGATIVE
Protein, ur: NEGATIVE mg/dL
Specific Gravity, Urine: 1.021 (ref 1.005–1.030)
pH: 7 (ref 5.0–8.0)

## 2019-03-08 LAB — I-STAT CREATININE, ED: Creatinine, Ser: 0.7 mg/dL (ref 0.44–1.00)

## 2019-03-08 LAB — COMPREHENSIVE METABOLIC PANEL
ALT: 22 U/L (ref 0–44)
AST: 31 U/L (ref 15–41)
Albumin: 4 g/dL (ref 3.5–5.0)
Alkaline Phosphatase: 60 U/L (ref 38–126)
Anion gap: 20 — ABNORMAL HIGH (ref 5–15)
BUN: 15 mg/dL (ref 8–23)
CO2: 20 mmol/L — ABNORMAL LOW (ref 22–32)
Calcium: 9.8 mg/dL (ref 8.9–10.3)
Chloride: 96 mmol/L — ABNORMAL LOW (ref 98–111)
Creatinine, Ser: 1.04 mg/dL — ABNORMAL HIGH (ref 0.44–1.00)
GFR calc Af Amer: 57 mL/min — ABNORMAL LOW (ref >60)
GFR calc non Af Amer: 49 mL/min — ABNORMAL LOW (ref >60)
Glucose, Bld: 521 mg/dL (ref 70–99)
Potassium: 3 mmol/L — ABNORMAL LOW (ref 3.5–5.1)
Sodium: 136 mmol/L (ref 135–145)
Total Bilirubin: 0.7 mg/dL (ref 0.3–1.2)
Total Protein: 7.7 g/dL (ref 6.5–8.1)

## 2019-03-08 LAB — POCT I-STAT 7, (LYTES, BLD GAS, ICA,H+H)
Acid-Base Excess: 5 mmol/L — ABNORMAL HIGH (ref 0.0–2.0)
Bicarbonate: 31.1 mmol/L — ABNORMAL HIGH (ref 20.0–28.0)
Calcium, Ion: 1.18 mmol/L (ref 1.15–1.40)
HCT: 38 % (ref 36.0–46.0)
Hemoglobin: 12.9 g/dL (ref 12.0–15.0)
O2 Saturation: 99 %
Patient temperature: 97.6
Potassium: 3.2 mmol/L — ABNORMAL LOW (ref 3.5–5.1)
Sodium: 137 mmol/L (ref 135–145)
TCO2: 33 mmol/L — ABNORMAL HIGH (ref 22–32)
pCO2 arterial: 48.6 mmHg — ABNORMAL HIGH (ref 32.0–48.0)
pH, Arterial: 7.411 (ref 7.350–7.450)
pO2, Arterial: 118 mmHg — ABNORMAL HIGH (ref 83.0–108.0)

## 2019-03-08 LAB — SARS CORONAVIRUS 2 BY RT PCR (HOSPITAL ORDER, PERFORMED IN ~~LOC~~ HOSPITAL LAB): SARS Coronavirus 2: NEGATIVE

## 2019-03-08 LAB — APTT: aPTT: 31 seconds (ref 24–36)

## 2019-03-08 LAB — LACTIC ACID, PLASMA: Lactic Acid, Venous: 7.3 mmol/L (ref 0.5–1.9)

## 2019-03-08 LAB — PROTIME-INR
INR: 1.1 (ref 0.8–1.2)
Prothrombin Time: 14.5 seconds (ref 11.4–15.2)

## 2019-03-08 MED ORDER — ONDANSETRON HCL 4 MG/2ML IJ SOLN
4.0000 mg | Freq: Once | INTRAMUSCULAR | Status: AC
  Administered 2019-03-08: 22:00:00 4 mg via INTRAVENOUS
  Filled 2019-03-08: qty 2

## 2019-03-08 MED ORDER — ENOXAPARIN SODIUM 40 MG/0.4ML ~~LOC~~ SOLN
40.0000 mg | Freq: Every day | SUBCUTANEOUS | Status: DC
  Administered 2019-03-09 – 2019-03-14 (×6): 40 mg via SUBCUTANEOUS
  Filled 2019-03-08 (×6): qty 0.4

## 2019-03-08 MED ORDER — LEVETIRACETAM IN NACL 500 MG/100ML IV SOLN
500.0000 mg | Freq: Two times a day (BID) | INTRAVENOUS | Status: DC
  Administered 2019-03-09 – 2019-03-11 (×5): 500 mg via INTRAVENOUS
  Filled 2019-03-08 (×6): qty 100

## 2019-03-08 MED ORDER — LORAZEPAM 2 MG/ML IJ SOLN
1.0000 mg | Freq: Once | INTRAMUSCULAR | Status: AC
  Administered 2019-03-08: 1 mg via INTRAVENOUS

## 2019-03-08 MED ORDER — SODIUM CHLORIDE 0.45 % IV SOLN
INTRAVENOUS | Status: DC
  Administered 2019-03-08: 22:00:00 via INTRAVENOUS

## 2019-03-08 MED ORDER — SODIUM CHLORIDE 0.9% FLUSH
3.0000 mL | Freq: Once | INTRAVENOUS | Status: AC
Start: 2019-03-08 — End: 2019-03-08
  Administered 2019-03-08: 3 mL via INTRAVENOUS

## 2019-03-08 MED ORDER — SODIUM CHLORIDE 0.9 % IV BOLUS
500.0000 mL | Freq: Once | INTRAVENOUS | Status: AC
  Administered 2019-03-08: 500 mL via INTRAVENOUS

## 2019-03-08 MED ORDER — LABETALOL HCL 5 MG/ML IV SOLN
5.0000 mg | INTRAVENOUS | Status: DC | PRN
  Administered 2019-03-09: 5 mg via INTRAVENOUS

## 2019-03-08 MED ORDER — LEVETIRACETAM IN NACL 1000 MG/100ML IV SOLN
1000.0000 mg | INTRAVENOUS | Status: AC
  Administered 2019-03-08: 1000 mg via INTRAVENOUS
  Filled 2019-03-08: qty 100

## 2019-03-08 MED ORDER — INSULIN REGULAR(HUMAN) IN NACL 100-0.9 UT/100ML-% IV SOLN
INTRAVENOUS | Status: DC
  Administered 2019-03-08: 22:00:00 3.9 [IU]/h via INTRAVENOUS
  Filled 2019-03-08: qty 100

## 2019-03-08 MED ORDER — POTASSIUM CHLORIDE 10 MEQ/100ML IV SOLN
10.0000 meq | INTRAVENOUS | Status: AC
  Administered 2019-03-08 – 2019-03-09 (×4): 10 meq via INTRAVENOUS
  Filled 2019-03-08 (×4): qty 100

## 2019-03-08 MED ORDER — DEXTROSE-NACL 5-0.45 % IV SOLN: INTRAVENOUS | Status: DC

## 2019-03-08 MED ORDER — ACETAMINOPHEN 325 MG PO TABS
650.0000 mg | ORAL_TABLET | ORAL | Status: DC | PRN
  Administered 2019-03-11 – 2019-03-12 (×3): 650 mg via ORAL
  Filled 2019-03-08 (×3): qty 2

## 2019-03-08 MED ORDER — ONDANSETRON HCL 4 MG/2ML IJ SOLN
4.0000 mg | Freq: Four times a day (QID) | INTRAMUSCULAR | Status: DC | PRN
  Administered 2019-03-11: 4 mg via INTRAVENOUS
  Filled 2019-03-08: qty 2

## 2019-03-08 MED ORDER — LABETALOL HCL 5 MG/ML IV SOLN
20.0000 mg | Freq: Once | INTRAVENOUS | Status: AC
  Administered 2019-03-08: 22:00:00 20 mg via INTRAVENOUS
  Filled 2019-03-08: qty 4

## 2019-03-08 MED ORDER — LORAZEPAM 2 MG/ML IJ SOLN
INTRAMUSCULAR | Status: AC
  Filled 2019-03-08: qty 1

## 2019-03-08 MED ORDER — LACTATED RINGERS IV BOLUS
1000.0000 mL | Freq: Once | INTRAVENOUS | Status: AC
  Administered 2019-03-08: 1000 mL via INTRAVENOUS

## 2019-03-08 MED ORDER — ALBUTEROL SULFATE (2.5 MG/3ML) 0.083% IN NEBU: 2.5000 mg | INHALATION_SOLUTION | RESPIRATORY_TRACT | Status: DC | PRN

## 2019-03-08 MED ORDER — IOHEXOL 350 MG/ML SOLN
100.0000 mL | Freq: Once | INTRAVENOUS | Status: AC | PRN
  Administered 2019-03-08: 100 mL via INTRAVENOUS

## 2019-03-08 MED ORDER — LORAZEPAM 2 MG/ML IJ SOLN
1.0000 mg | INTRAMUSCULAR | Status: DC | PRN
  Filled 2019-03-08: qty 1

## 2019-03-08 NOTE — ED Notes (Signed)
Pt placed back on 2L, vomited x 1, pt suctioned, not maintaining airway while vomiting

## 2019-03-08 NOTE — ED Notes (Signed)
Lactic Acid 7.3- Dr. Adela Lank notified.  Daughter at bedside.  Dr. Wilford Corner notified and coming to update her.

## 2019-03-08 NOTE — ED Notes (Signed)
Not a stroke per Neuro

## 2019-03-08 NOTE — ED Provider Notes (Signed)
[facility] EMERGENCY DEPARTMENT Provider Note   CSN: [id number] Arrival date & time: 03/08/19  1941    History   Chief Complaint Chief Complaint  Patient presents with   Code Stroke   Seizures    HPI Melanie Turner is a 85 y.o. female.     85 yo F with a chief complaint of unresponsiveness.  Patient was found down by the family.  States that she had 2 reported seizures in route.  Significantly hypertensive initially.  Was given 1.5 mg of Versed in route with cessation of seizures.  Arrived as a code stroke.  The history is provided by the EMS personnel.  Seizures  Seizure activity on arrival: no   Seizure type:  Grand mal Initial focality:  None Episode characteristics: abnormal movements and incontinence   Return to baseline: no   Severity:  Moderate Duration:  20 minutes Timing:  Clustered Number of seizures this episode:  2 History of seizures: no     Past Medical History:  Diagnosis Date   Diabetes mellitus without complication (HCC)    Hypertension     There are no active problems to display for this patient.   Past Surgical History:  Procedure Laterality Date   THYROIDECTOMY       OB History   No obstetric history on file.      Home Medications    Prior to Admission medications   Medication Sig Start Date End Date Taking? Authorizing Provider  donepezil (ARICEPT) 5 MG tablet Take 5 mg by mouth every evening. 06/01/18   [provider]  meloxicam (MOBIC) 7.5 MG tablet Take 1 tablet (7.5 mg total) by mouth daily. 04/19/15   Triplett, Rulon Eisenmenger B, FNP  metFORMIN (GLUCOPHAGE) 500 MG tablet Take 1 tablet (500 mg total) by mouth 2 (two) times daily with a meal. Patient not taking: Reported on 03/08/2019 04/19/15   Kem Boroughs B, FNP  metFORMIN (GLUCOPHAGE-XR) 500 MG 24 hr tablet Take 1,500 mg by mouth daily with supper.    [provider]  mirtazapine (REMERON) 7.5 MG tablet Take 7.5 mg by mouth every evening.  08/31/18    [provider]  triamcinolone ointment (KENALOG) 0.1 % Apply 1 application topically 2 (two) times daily as needed (skin irritation).    [provider]    Family History No family history on file.  Social History Social History   Tobacco Use   Smoking status: Never Smoker   Smokeless tobacco: Never Used  Substance Use Topics   Alcohol use: Yes    Comment: occasional   Drug use: No     Allergies   Patient has no known allergies.   Review of Systems Review of Systems  Unable to perform ROS: Patient nonverbal  Constitutional: Negative for chills and fever.  HENT: Negative for congestion and rhinorrhea.   Eyes: Negative for redness and visual disturbance.  Respiratory: Negative for shortness of breath and wheezing.   Cardiovascular: Negative for chest pain and palpitations.  Gastrointestinal: Negative for nausea and vomiting.  Genitourinary: Negative for dysuria and urgency.  Musculoskeletal: Negative for arthralgias and myalgias.  Skin: Negative for pallor and wound.  Neurological: Positive for seizures. Negative for dizziness and headaches.     Physical Exam Updated Vital Signs BP (!) 160/93    Pulse 78    Temp 97.6 F (36.4 C) (Temporal)    Resp 17    SpO2 100%   Physical Exam Vitals signs and nursing note reviewed.  Constitutional:      General: She is not in acute distress.    Appearance: She is well-developed. She is not diaphoretic.  HENT:     Head: Normocephalic and atraumatic.  Eyes:     Pupils: Pupils are equal, round, and reactive to light.  Neck:     Musculoskeletal: Normal range of motion and neck supple.  Cardiovascular:     Rate and Rhythm: Normal rate and regular rhythm.     Heart sounds: No murmur. No friction rub. No gallop.   Pulmonary:     Effort: Pulmonary effort is normal.     Breath sounds: No wheezing or rales.  Abdominal:     General: There is no distension.     Palpations: Abdomen is soft.     Tenderness:  There is no abdominal tenderness.  Musculoskeletal:        General: No tenderness.  Skin:    General: Skin is warm and dry.  Neurological:     Comments: Withdraws from pain in all 4 extremities      ED Treatments / Results  Labs (all labs ordered are listed, but only abnormal results are displayed) Labs Reviewed  COMPREHENSIVE METABOLIC PANEL - Abnormal; Notable for the following components:      Result Value   Potassium 3.0 (*)    Chloride 96 (*)    CO2 20 (*)    Glucose, Bld 521 (*)    Creatinine, Ser 1.04 (*)    GFR calc non Af Amer 49 (*)    GFR calc Af Amer 57 (*)    Anion gap 20 (*)    All other components within normal limits  LACTIC ACID, PLASMA - Abnormal; Notable for the following components:   Lactic Acid, Venous 7.3 (*)    All other components within normal limits  URINALYSIS, ROUTINE W REFLEX MICROSCOPIC - Abnormal; Notable for the following components:   Color, Urine COLORLESS (*)    Glucose, UA >=500 (*)    All other components within normal limits  CBG MONITORING, ED - Abnormal; Notable for the following components:   Glucose-Capillary 475 (*)    All other components within normal limits  CBG MONITORING, ED - Abnormal; Notable for the following components:   Glucose-Capillary 453 (*)    All other components within normal limits  POCT I-STAT 7, (LYTES, BLD GAS, ICA,H+H) - Abnormal; Notable for the following components:   pCO2 arterial 48.6 (*)    pO2, Arterial 118.0 (*)    Bicarbonate 31.1 (*)    TCO2 33 (*)    Acid-Base Excess 5.0 (*)    Potassium 3.2 (*)    All other components within normal limits  CBG MONITORING, ED - Abnormal; Notable for the following components:   Glucose-Capillary 373 (*)    All other components within normal limits  SARS CORONAVIRUS 2 (HOSPITAL ORDER, PERFORMED IN [facility])  PROTIME-INR  APTT  CBC  DIFFERENTIAL  LACTIC ACID, PLASMA  BLOOD GAS, ARTERIAL  I-STAT CREATININE, ED    EKG EKG  Interpretation  Date/Time:  Friday Mar 08 2019 20:07:56 EDT Ventricular Rate:  110 PR Interval:    QRS Duration: 89 QT Interval:  379 QTC Calculation: 513 R Axis:   -15 Text Interpretation:  Sinus tachycardia Consider right atrial enlargement Borderline left axis deviation Prolonged QT interval No significant change since last tracing Confirmed by Melene PlanFloyd, Alyana Kreiter [phone]) on 03/08/2019 8:23:05 PM   Radiology Ct Angio Head W Or Wo Contrast  Result Date: 03/08/2019 CLINICAL DATA:  Unresponsive.  Seizure. EXAM: CT ANGIOGRAPHY HEAD AND NECK CT PERFUSION BRAIN TECHNIQUE: Multidetector CT imaging of the head and neck was performed using the standard protocol during bolus administration of intravenous contrast. Multiplanar CT image reconstructions and MIPs were obtained to evaluate the vascular anatomy. Carotid stenosis measurements (when applicable) are obtained utilizing NASCET criteria, using the distal internal carotid diameter as the denominator. Multiphase CT imaging of the brain was performed following IV bolus contrast injection. Subsequent parametric perfusion maps were calculated using RAPID software. CONTRAST:  OMNIPAQUE IOHEXOL 350 MG/ML SOLN COMPARISON:  None. FINDINGS: CTA NECK FINDINGS Aortic arch: Incomplete imaging of the aortic arch. Mild arch atherosclerosis. Widely patent arch vessel origins with three-vessel arch configuration. Right carotid system: Patent with mild atherosclerotic plaque at the carotid bifurcation. No evidence of significant stenosis or dissection. Mild beading of the mid cervical ICA. Left carotid system: Patent with minimal plaque at the carotid bifurcation. No evidence significant stenosis or dissection. Minimal irregularity versus mild motion artifact in the mid cervical ICA. Vertebral arteries: Patent without evidence of stenosis or dissection. Mildly dominant right vertebral artery. Skeleton: Minimal cervical spondylosis. Other neck: No mass or enlarged lymph nodes  identified. Upper chest: Mild biapical pleuroparenchymal scarring. Review of the MIP images confirms the above findings CTA HEAD FINDINGS Anterior circulation: The internal carotid arteries are widely patent from skull base to carotid termini. ACAs and MCAs are patent without evidence of proximal branch occlusion or significant proximal stenosis. No aneurysm is identified. Posterior circulation: The intracranial vertebral arteries are widely patent to the basilar. Patent bilateral PICA, left AICA, and bilateral SCA origins are identified. Right AICA is diminutive and not well seen. The basilar artery is widely patent. There is a diminutive right posterior communicating artery. PCAs are patent with branch vessel irregularity but no significant proximal stenosis. No aneurysm is identified. Venous sinuses: Not well evaluated due to arterial phase contrast timing. Anatomic variants: None. Review of the MIP images confirms the above findings CT Brain Perfusion Findings: ASPECTS: 10 CBF (<30%) Volume: 0mL Perfusion (Tmax>6.0s) volume: 0mL Mismatch Volume: 0mL Infarction Location:None IMPRESSION: 1. Minimal atherosclerosis in the head and neck without large vessel occlusion or significant stenosis. 2. Negative CT perfusion imaging. 3.  Aortic Atherosclerosis (ICD10-I70.0). Preliminary finding of no large vessel occlusion was communicated to Dr. Wilford Corner at 8:04 pmon 5/8/2020by text page via the [facility] messaging system. Electronically Signed   By: Sebastian Ache M.D.   On: 03/08/2019 20:23   Ct Angio Neck W Or Wo Contrast  Result Date: 03/08/2019 CLINICAL DATA:  Unresponsive.  Seizure. EXAM: CT ANGIOGRAPHY HEAD AND NECK CT PERFUSION BRAIN TECHNIQUE: Multidetector CT imaging of the head and neck was performed using the standard protocol during bolus administration of intravenous contrast. Multiplanar CT image reconstructions and MIPs were obtained to evaluate the vascular anatomy. Carotid stenosis measurements (when applicable)  are obtained utilizing NASCET criteria, using the distal internal carotid diameter as the denominator. Multiphase CT imaging of the brain was performed following IV bolus contrast injection. Subsequent parametric perfusion maps were calculated using RAPID software. CONTRAST:  OMNIPAQUE IOHEXOL 350 MG/ML SOLN COMPARISON:  None. FINDINGS: CTA NECK FINDINGS Aortic arch: Incomplete imaging of the aortic arch. Mild arch atherosclerosis. Widely patent arch vessel origins with three-vessel arch configuration. Right carotid system: Patent with mild atherosclerotic plaque at the carotid bifurcation. No evidence of significant stenosis or dissection. Mild beading of the mid cervical ICA. Left carotid system: Patent with minimal plaque  at the carotid bifurcation. No evidence significant stenosis or dissection. Minimal irregularity versus mild motion artifact in the mid cervical ICA. Vertebral arteries: Patent without evidence of stenosis or dissection. Mildly dominant right vertebral artery. Skeleton: Minimal cervical spondylosis. Other neck: No mass or enlarged lymph nodes identified. Upper chest: Mild biapical pleuroparenchymal scarring. Review of the MIP images confirms the above findings CTA HEAD FINDINGS Anterior circulation: The internal carotid arteries are widely patent from skull base to carotid termini. ACAs and MCAs are patent without evidence of proximal branch occlusion or significant proximal stenosis. No aneurysm is identified. Posterior circulation: The intracranial vertebral arteries are widely patent to the basilar. Patent bilateral PICA, left AICA, and bilateral SCA origins are identified. Right AICA is diminutive and not well seen. The basilar artery is widely patent. There is a diminutive right posterior communicating artery. PCAs are patent with branch vessel irregularity but no significant proximal stenosis. No aneurysm is identified. Venous sinuses: Not well evaluated due to arterial phase  contrast timing. Anatomic variants: None. Review of the MIP images confirms the above findings CT Brain Perfusion Findings: ASPECTS: 10 CBF (<30%) Volume: 0mL Perfusion (Tmax>6.0s) volume: 0mL Mismatch Volume: 0mL Infarction Location:None IMPRESSION: 1. Minimal atherosclerosis in the head and neck without large vessel occlusion or significant stenosis. 2. Negative CT perfusion imaging. 3.  Aortic Atherosclerosis (ICD10-I70.0). Preliminary finding of no large vessel occlusion was communicated to Dr. Wilford Corner at 8:04 pmon 5/8/2020by text page via the [facility] messaging system. Electronically Signed   By: Sebastian Ache M.D.   On: 03/08/2019 20:23   Ct Cerebral Perfusion W Contrast  Result Date: 03/08/2019 CLINICAL DATA:  Unresponsive.  Seizure. EXAM: CT ANGIOGRAPHY HEAD AND NECK CT PERFUSION BRAIN TECHNIQUE: Multidetector CT imaging of the head and neck was performed using the standard protocol during bolus administration of intravenous contrast. Multiplanar CT image reconstructions and MIPs were obtained to evaluate the vascular anatomy. Carotid stenosis measurements (when applicable) are obtained utilizing NASCET criteria, using the distal internal carotid diameter as the denominator. Multiphase CT imaging of the brain was performed following IV bolus contrast injection. Subsequent parametric perfusion maps were calculated using RAPID software. CONTRAST:  OMNIPAQUE IOHEXOL 350 MG/ML SOLN COMPARISON:  None. FINDINGS: CTA NECK FINDINGS Aortic arch: Incomplete imaging of the aortic arch. Mild arch atherosclerosis. Widely patent arch vessel origins with three-vessel arch configuration. Right carotid system: Patent with mild atherosclerotic plaque at the carotid bifurcation. No evidence of significant stenosis or dissection. Mild beading of the mid cervical ICA. Left carotid system: Patent with minimal plaque at the carotid bifurcation. No evidence significant stenosis or dissection. Minimal irregularity versus mild  motion artifact in the mid cervical ICA. Vertebral arteries: Patent without evidence of stenosis or dissection. Mildly dominant right vertebral artery. Skeleton: Minimal cervical spondylosis. Other neck: No mass or enlarged lymph nodes identified. Upper chest: Mild biapical pleuroparenchymal scarring. Review of the MIP images confirms the above findings CTA HEAD FINDINGS Anterior circulation: The internal carotid arteries are widely patent from skull base to carotid termini. ACAs and MCAs are patent without evidence of proximal branch occlusion or significant proximal stenosis. No aneurysm is identified. Posterior circulation: The intracranial vertebral arteries are widely patent to the basilar. Patent bilateral PICA, left AICA, and bilateral SCA origins are identified. Right AICA is diminutive and not well seen. The basilar artery is widely patent. There is a diminutive right posterior communicating artery. PCAs are patent with branch vessel irregularity but no significant proximal stenosis. No aneurysm is identified. Venous sinuses:  Not well evaluated due to arterial phase contrast timing. Anatomic variants: None. Review of the MIP images confirms the above findings CT Brain Perfusion Findings: ASPECTS: 10 CBF (<30%) Volume: 0mL Perfusion (Tmax>6.0s) volume: 0mL Mismatch Volume: 0mL Infarction Location:None IMPRESSION: 1. Minimal atherosclerosis in the head and neck without large vessel occlusion or significant stenosis. 2. Negative CT perfusion imaging. 3.  Aortic Atherosclerosis (ICD10-I70.0). Preliminary finding of no large vessel occlusion was communicated to Dr. Wilford Corner at 8:04 pmon 5/8/2020by text page via the [facility] messaging system. Electronically Signed   By: Sebastian Ache M.D.   On: 03/08/2019 20:23   Dg Chest Port 1 View  Result Date: 03/08/2019 CLINICAL DATA:  Recent seizure activity and altered mental status EXAM: PORTABLE CHEST 1 VIEW COMPARISON:  09/05/2018 FINDINGS: Cardiac shadows within normal  limits. Aortic calcifications are again noted. The lungs are well aerated bilaterally without focal infiltrate. No acute bony abnormality is seen. IMPRESSION: No active disease. Electronically Signed   By: Alcide Clever M.D.   On: 03/08/2019 20:59   Ct Head Code Stroke Wo Contrast  Result Date: 03/08/2019 CLINICAL DATA:  Code stroke. Unresponsive. Seizures. Right-sided gaze. EXAM: CT HEAD WITHOUT CONTRAST TECHNIQUE: Contiguous axial images were obtained from the base of the skull through the vertex without intravenous contrast. COMPARISON:  09/05/2018 FINDINGS: Brain: There is no evidence of acute infarct, intracranial hemorrhage, mass, midline shift, or extra-axial fluid collection. Basal ganglia calcifications are unchanged. Cerebral atrophy is unchanged with severe mesial temporal lobe atrophy again noted bilaterally. Patchy cerebral white matter hypodensities are similar to the prior study and nonspecific but compatible with moderate chronic small vessel ischemic disease. Vascular: Calcified atherosclerosis at the skull base. No hyperdense vessel. Skull: No fracture or focal osseous lesion. Sinuses/Orbits: Paranasal sinuses and mastoid air cells are clear. Bilateral cataract extraction. Other: None. ASPECTS [facility] Stroke Program Early CT Score) - Ganglionic level infarction (caudate, lentiform nuclei, internal capsule, insula, M1-M3 cortex): 7 - Supraganglionic infarction (M4-M6 cortex): 3 Total score (0-10 with 10 being normal): 10 IMPRESSION: 1. No evidence of acute intracranial abnormality. 2. ASPECTS is 10. 3. Moderate chronic small vessel ischemic disease. Severe mesial temporal lobe atrophy. These results were communicated to Dr. Wilford Corner at 8:04 pm on 03/08/2019 by text page via the [facility] messaging system. Electronically Signed   By: Sebastian Ache M.D.   On: 03/08/2019 20:06    Procedures Procedures (including critical care time)  Medications Ordered in ED Medications  0.45 % sodium chloride  infusion ( Intravenous New Bag/Given 03/08/19 2141)  dextrose 5 %-0.45 % sodium chloride infusion ( Intravenous Hold 03/08/19 2128)  insulin regular, human (MYXREDLIN) 100 units/ 100 mL infusion (3.1 Units/hr Intravenous Rate/Dose Change 03/08/19 2245)  potassium chloride 10 mEq in 100 mL IVPB (10 mEq Intravenous New Bag/Given 03/08/19 2235)  sodium chloride flush (NS) 0.9 % injection 3 mL (3 mLs Intravenous Given 03/08/19 1959)  iohexol (OMNIPAQUE) 350 MG/ML injection 100 mL (100 mLs Intravenous Contrast Given 03/08/19 2002)  sodium chloride 0.9 % bolus 500 mL (0 mLs Intravenous Stopped 03/08/19 2125)  levETIRAcetam (KEPPRA) IVPB 1000 mg/100 mL premix (0 mg Intravenous Stopped 03/08/19 2110)  LORazepam (ATIVAN) injection 1 mg (1 mg Intravenous Given 03/08/19 2032)  ondansetron (ZOFRAN) injection 4 mg (4 mg Intravenous Given 03/08/19 2153)  labetalol (NORMODYNE) injection 20 mg (20 mg Intravenous Given 03/08/19 2153)     Initial Impression / Assessment and Plan / ED Course  I have reviewed the triage vital signs and the nursing notes.  Pertinent labs & imaging results that were available during my care of the patient were reviewed by me and considered in my medical decision making (see chart for details).        85 yo F with a chief complaint of unresponsiveness.  Arrived as a code stroke had a couple seizures in room.  Evaluated the bridge and then taken back to the scanner.  CT scan without obvious pathology.  Actually had some improvement of her mental status by time.  Come back to the room.  Thought to be seizure.  Seen by Dr. Jerrell Belfast recommended loading with a gram of Keppra now.  Felt that she likely needs admission for EEG and MRI.  Patient had another seizure while in the emergency department.  I witnessed part of it was tonic-clonic.  Nursing said she had some mild initial right-sided symptoms.  Resolved in less than 5 minutes.  Was given a 1 mg dose of Ativan.  Was loaded with Keppra without any  continued seizure-like activity.  Patient continues to have very minimal mental status.  Patient was reassessed by Dr. Jerrell Belfast with her low mental status he recommended I discussed the case with the ICU physicians.  Patient found to have hyperglycemia, metabolic acidosis with anion gap.   CRITICAL CARE Performed by: Rae Roam   Total critical care time: 35 minutes  Critical care time was exclusive of separately billable procedures and treating other patients.  Critical care was necessary to treat or prevent imminent or life-threatening deterioration.  Critical care was time spent personally by me on the following activities: development of treatment plan with patient and/or surrogate as well as nursing, discussions with consultants, evaluation of patient's response to treatment, examination of patient, obtaining history from patient or surrogate, ordering and performing treatments and interventions, ordering and review of laboratory studies, ordering and review of radiographic studies, pulse oximetry and re-evaluation of patient's condition.   The patients results and plan were reviewed and discussed.   Any x-rays performed were independently reviewed by myself.   Differential diagnosis were considered with the presenting HPI.  Medications  0.45 % sodium chloride infusion ( Intravenous New Bag/Given 03/08/19 2141)  dextrose 5 %-0.45 % sodium chloride infusion ( Intravenous Hold 03/08/19 2128)  insulin regular, human (MYXREDLIN) 100 units/ 100 mL infusion (3.1 Units/hr Intravenous Rate/Dose Change 03/08/19 2245)  potassium chloride 10 mEq in 100 mL IVPB (10 mEq Intravenous New Bag/Given 03/08/19 2235)  sodium chloride flush (NS) 0.9 % injection 3 mL (3 mLs Intravenous Given 03/08/19 1959)  iohexol (OMNIPAQUE) 350 MG/ML injection 100 mL (100 mLs Intravenous Contrast Given 03/08/19 2002)  sodium chloride 0.9 % bolus 500 mL (0 mLs Intravenous Stopped 03/08/19 2125)  levETIRAcetam (KEPPRA) IVPB  1000 mg/100 mL premix (0 mg Intravenous Stopped 03/08/19 2110)  LORazepam (ATIVAN) injection 1 mg (1 mg Intravenous Given 03/08/19 2032)  ondansetron (ZOFRAN) injection 4 mg (4 mg Intravenous Given 03/08/19 2153)  labetalol (NORMODYNE) injection 20 mg (20 mg Intravenous Given 03/08/19 2153)    Vitals:   03/08/19 2145 03/08/19 2200 03/08/19 2215 03/08/19 2230  BP: (!) 187/124 (!) 145/87 (!) 172/97 (!) 160/93  Pulse: (!) 104 78 78 78  Resp: (!) Temp:      TempSrc:      SpO2: 100% 100% 100% 100%    Final diagnoses:  Status epilepticus (HCC)    Admission/ observation were discussed with the admitting physician, patient and/or family and they are comfortable with  the plan.    Final Clinical Impressions(s) / ED Diagnoses   Final diagnoses:  Status epilepticus [facility])    ED Discharge Orders    None       Melene Plan, DO 03/08/19 2247

## 2019-03-08 NOTE — H&P (Addendum)
Melanie Turner:  Melanie Turner, MRN:  [id number], DOB:  [birth year], LOS: 0 ADMISSION DATE:  03/08/2019, CONSULTATION DATE:  03/08/2019 REFERRING MD:  Adela LankFloyd- EM, CHIEF COMPLAINT:  AMS, seizure  Brief History   [age] F yo found unresponsive this evening by family member, 2 witnessed seizures by EMS, 1 witnessed clonic tonic seizure in ED. Patient hyperglycemic and hypertensive on presentation.   History of present illness   85 yo F PMH Dementia, DM2, HTN who presents to ED 03/08/19 via EMS. Patient was found unresponsive by family member this evening, last seen normal approximately noon, and was transported to ED via EMS. En route, patient sustained 2x tonic clonic seizures receiving 1.5mg  Ativan by EMS with cessation of seizure. In ED patient sustained additional tonic clonic seizure, receiving 1mg  Ativan. Patient daughter at bedside in ED, history obtained from her. Denies recent illness, weakness, n/v/d, change or loss of appetite. No recent sick contacts. No HA, dizziness, confusion differing from baseline. No SOB, wheezing, URI sx. No chest pain, palpitations, swelling. Daughter endorses mother's medication compliance via assistance from daughter and son daily.   Patient Hypertensive on presentation SBP 190s, and hyperglycemic BG 521, Lactate 7.3. CT Head negative for acute abnormality. Patient started on insulin gtt in ED. Of note, no ketones in urine, ABG 7.41/48.6/118/33/5/31  Past Medical History  Dementia HTN DM2  Significant Hospital Events   03/08/19> 3x tonic clonic seizure. Started on insulin gtt. Admitting to ICU   Consults:  Neurology PCCM  Procedures:    Significant Diagnostic Tests:  CT Head 5/8> temporal lobe atrophy, no acute abnormality   Micro Data:  5/8> SARS CoV2- negative   Antimicrobials:    Interim history/subjective:  No further seizure activity Patient on insulin gtt  Objective   Blood pressure (!) 159/89, pulse 75, temperature 97.6 F (36.4 C), temperature source  Temporal, resp. rate 19, SpO2 100 %.        Intake/Output Summary (Last 24 hours) at 03/08/2019 2258 Last data filed at 03/08/2019 2159 Gross per 24 hour  Intake 1200 ml  Output 1100 ml  Net 100 ml   There were no vitals filed for this visit.  Examination: General: Frail chronically ill appearing elderly female, supine in bed  HENT: NCAT, pink mmm, anicteric sclera, trachea midline  Lungs: Bilateral rhonchi LUL RUL. Symmetrical chest expansion. No accessory muscle recruitment  Cardiovascular: RRR s1s2 no rgm, no JVD, no lower extremity edema  Abdomen: soft, round, ndnt, normoactive x4  Extremities: Symmetrical bulk and tone, no obvious joint or MSK deformity. No clubbing, no cyanosis  Neuro: Does not follow commands. Moves BUE BLE spontaneously. PERRL  Skin: Clean, dry, warm,  without rash   Resolved Hospital Problem list     Assessment & Plan:   Seizure -etiology unknown. Possibly hyperglycemia related however do not favor DKA given absence of common lab findings associated with DKA. Possibly related to hypertension given SBP >190 at time of presentation -CT Head negative for acute abnormality  -associated elevated lactic acid 7.3 P -Neurology following appreciate recs -Keppra 500 BID -PRN Ativan -NPO -Seizure precautions  -MRI, per neurology -EEG in morning, or sooner if suspicion for SE, per neurology -Frequent neuro check  -Patient is DNR/DNI   Hyperglycemia, DM2 -presenting glucose 521 -lab findings thusfar do not align with DKA.  -Consider HHS given absent urine ketones, pH >7.3, bicarb >15, however glucose <600  -Patient on home metformin, believed to be compliant with Rx with assistance of daughter, son. Does not regularly  check CBG at home  -Lactate 7.3 P - awaiting beta hydroxybutyric acid  - 1L IVF bolus now then mIVF - Transition insulin gtt to subcutaneous  - awaiting A1c - BMP now, BMP in AM   HTN, hypertensive urgency  Prolonged qtc on ECG -on home  lisinopril -received IV labetalol in ED  P -Continue tele monitoring  -SBP goal 160-170  -PRN labetalol  -qtc qShift  Electrolyte Abnormalities Hypokalemia P Replacing K  Monitor BMP, continue to replace K PRN Check mag, phos now and in AM. Replace PRN  Dementia -Delirium precautions   Elevated Lactic Acid -in setting of seizure, hyperglycemia, hypertensive urgency -WBC 8.2, low suspicion for infection  P -Trend lactic acid -Trend temperature, WBC. If new leukocytosis or fever, culture   Goals of Care: -Patient is DNR/DNI   Best practice:  Diet: NPO Pain/Anxiety/Delirium protocol (if indicated): n/a VAP protocol (if indicated): n/a DVT prophylaxis: lovenox GI prophylaxis: n/a Glucose control: insulin gtt to subcutaneous Mobility: bedrest Code Status: DNR/DNI Family Communication: Daughter at bedside in ED Disposition: Admit to ICU   Labs   CBC: Recent Labs  Lab 03/08/19 1945 03/08/19 2159  WBC 8.2  --   NEUTROABS 4.6  --   HGB 13.2 12.9  HCT 41.2 38.0  MCV 88.6  --   PLT 267  --     Basic Metabolic Panel: Recent Labs  Lab 03/08/19 1945 03/08/19 1950 03/08/19 2159  NA 136  --  137  K 3.0*  --  3.2*  CL 96*  --   --   CO2 20*  --   --   GLUCOSE 521*  --   --   BUN 15  --   --   CREATININE 1.04* 0.70  --   CALCIUM 9.8  --   --    GFR: CrCl cannot be calculated (Unknown ideal weight.). Recent Labs  Lab 03/08/19 1945 03/08/19 2019  WBC 8.2  --   LATICACIDVEN  --  7.3*    Liver Function Tests: Recent Labs  Lab 03/08/19 1945  AST 31  ALT 22  ALKPHOS 60  BILITOT 0.7  PROT 7.7  ALBUMIN 4.0   No results for input(s): LIPASE, AMYLASE in the last 168 hours. No results for input(s): AMMONIA in the last 168 hours.  ABG    Component Value Date/Time   PHART 7.411 03/08/2019 2159   PCO2ART 48.6 (H) 03/08/2019 2159   PO2ART 118.0 (H) 03/08/2019 2159   HCO3 31.1 (H) 03/08/2019 2159   TCO2 33 (H) 03/08/2019 2159   O2SAT 99.0 03/08/2019  2159     Coagulation Profile: Recent Labs  Lab 03/08/19 1945  INR 1.1    Cardiac Enzymes: No results for input(s): CKTOTAL, CKMB, CKMBINDEX, TROPONINI in the last 168 hours.  HbA1C: No results found for: HGBA1C  CBG: Recent Labs  Lab 03/08/19 2043 03/08/19 2133 03/08/19 2243  GLUCAP 475* 453* 373*    Review of Systems:   As per HPI  Past Medical History  She,  has a past medical history of Diabetes mellitus without complication (HCC) and Hypertension.   Surgical History    Past Surgical History:  Procedure Laterality Date   THYROIDECTOMY       Social History   reports that she has never smoked. She has never used smokeless tobacco. She reports current alcohol use. She reports that she does not use drugs.   Family History   Her family history is not on file.   Allergies  No Known Allergies   Home Medications  Prior to Admission medications   Medication Sig Start Date End Date Taking? Authorizing Provider  donepezil (ARICEPT) 5 MG tablet Take 5 mg by mouth every evening. 06/01/18   [provider]  meloxicam (MOBIC) 7.5 MG tablet Take 1 tablet (7.5 mg total) by mouth daily. 04/19/15   Triplett, Rulon Eisenmenger B, FNP  metFORMIN (GLUCOPHAGE) 500 MG tablet Take 1 tablet (500 mg total) by mouth 2 (two) times daily with a meal. Patient not taking: Reported on 03/08/2019 04/19/15   Kem Boroughs B, FNP  metFORMIN (GLUCOPHAGE-XR) 500 MG 24 hr tablet Take 1,500 mg by mouth daily with supper.    [provider]  mirtazapine (REMERON) 7.5 MG tablet Take 7.5 mg by mouth every evening.  08/31/18   [provider]  triamcinolone ointment (KENALOG) 0.1 % Apply 1 application topically 2 (two) times daily as needed (skin irritation).    [provider]     Critical care time: 38 minutes     Tessie Fass MSN, AGACNP-BC [facility] [phone] If no answer, [phone] 03/09/2019, 12:05 AM

## 2019-03-08 NOTE — ED Triage Notes (Signed)
Pt comes via [facility], LSN [address] found unresponsive by family at 75, witnessed 2 seizures by EMS, given 1.5 mg versed en route, on non-rebreather, no hx of seizure, R sided gaze.

## 2019-03-08 NOTE — ED Notes (Addendum)
Pt began to have grand mal seizure with right sided gaze, witnessed by this RN, MD notified, placed back on non-rebreather

## 2019-03-08 NOTE — ED Notes (Signed)
Placed on 2L ?

## 2019-03-08 NOTE — Consult Note (Addendum)
Neurology Consultation  Reason for Consult: Acute code stroke-brought in by [facility] for unresponsiveness Referring Physician: Dr. Melene Plan  CC: Acute code stroke-unresponsiveness  History is obtained from: EMS, later family  HPI: Melanie Turner is a 85 y.o. female past medical history of dementia, diabetes, hypertension, in her usual state of health probably around noon today, found on the ground by family member this evening when EMS was called. Initial communication by the paging system said the patient is being brought in as an acute code stroke for unresponsiveness but further details obtained by EMS and family informed us that the patient was found down on the ground and upon evaluation by EMS had a witnessed generalized tonic-clonic seizure.  She received 1 mg of Versed followed by 0.5 mg of Versed on scene and was transported to [facility] due to unresponsiveness as an acute code stroke. She has not had seizures in the past but has a family history of a brother who has had seizures. The family denies any preceding sicknesses or illnesses prior to this happened. The patient at baseline is living with family, able to walk some, needs help with ADLs. The daughter later arrived at bedside and confirmed the history above. The patient had one episode of generalized tonic-clonic seizure activity in the ER for which she received Ativan. Noncontrast head CT, CTA head and neck and CT perfusion studies were all unremarkable. Her labs revealed severe hyperglycemia and possible DKA as well as vitals showed systolic blood pressure in the 200s.  LKW: 12 PM today tpa given?: no, outside the window and not stroke, likely seizure from metabolic derangements Premorbid modified Rankin scale (mRS): 4  ROS: Unable to ascertain due to patient's altered mental status but obtained some from the family and documented above.  Past Medical History:  Diagnosis Date  . Diabetes mellitus without  complication (HCC)   . Hypertension     No family history on file. No family history of strokes  Social History:   reports that she has never smoked. She has never used smokeless tobacco. She reports current alcohol use. She reports that she does not use drugs. Denies tobacco, alcohol or illicit drug use  Medications No current facility-administered medications for this encounter.   Current Outpatient Medications:  .  donepezil (ARICEPT) 5 MG tablet, Take 5 mg by mouth every evening., Disp: , Rfl:  .  meloxicam (MOBIC) 7.5 MG tablet, Take 1 tablet (7.5 mg total) by mouth daily., Disp: 30 tablet, Rfl: 0 .  metFORMIN (GLUCOPHAGE) 500 MG tablet, Take 1 tablet (500 mg total) by mouth 2 (two) times daily with a meal., Disp: 60 tablet, Rfl: 0 .  metFORMIN (GLUCOPHAGE-XR) 500 MG 24 hr tablet, Take 1,500 mg by mouth daily with supper., Disp: , Rfl:  .  mirtazapine (REMERON) 7.5 MG tablet, Take 7.5 mg by mouth at bedtime., Disp: , Rfl:  .  triamcinolone ointment (KENALOG) 0.1 %, Apply 1 application topically 2 (two) times daily as needed (skin irritation)., Disp: , Rfl:   Exam: Current vital signs: BP (!) 132/100   Pulse 100   Temp 97.6 F (36.4 C) (Temporal)   Resp 20   SpO2 100%  Vital signs in last 24 hours: Temp:  [97.6 F (36.4 C)] 97.6 F (36.4 C) (05/08 2009) Pulse Rate:  [100] 100 (05/08 2009) Resp:  [20] 20 (05/08 2009) BP: (132)/(100) 132/100 (05/08 2009) SpO2:  [100 %] 100 % (05/08 2009) General: Patient is obtunded, does not appear  to be in any distress. HEENT: Normocephalic atraumatic CVS: S1-2 heard regular rate rhythm Lungs: Rales scattered all over Abdomen: Nondistended nontender Extremities: Warm well perfused Neurological exam Patient is obtunded, does not open eyes to voice. To noxious stimulation, she localizes briskly with the right extremity and slowly with the left upper extremity. She withdraws both lower extremities to noxious immolation Cranial nerves:  Pupils are equal round reactive light, no gaze preference or deviation, does not blink to threat but attempts to keep her eyes closed when the examiner attempts to examine her. Face appears symmetric with no obvious asymmetry. Withdrawal to noxious stimulation in all 4. Localization to noxious stimulation in the midline- much stronger on the right upper extremity versus left upper extremity. NIHSS 23  Labs I have reviewed labs in epic and the results pertinent to this consultation are:  CBC    Component Value Date/Time   WBC 8.2 03/08/2019 1945   RBC 4.65 03/08/2019 1945   HGB 12.9 03/08/2019 2159   HGB 12.7 06/30/2014 1501   HCT 38.0 03/08/2019 2159   HCT 39.0 06/30/2014 1501   PLT 267 03/08/2019 1945   PLT 252 06/30/2014 1501   MCV 88.6 03/08/2019 1945   MCV 89 06/30/2014 1501   MCH 28.4 03/08/2019 1945   MCHC 32.0 03/08/2019 1945   RDW 12.8 03/08/2019 1945   RDW 13.9 06/30/2014 1501   LYMPHSABS 3.0 03/08/2019 1945   LYMPHSABS 2.9 06/30/2014 1501   MONOABS 0.5 03/08/2019 1945   MONOABS 0.4 06/30/2014 1501   EOSABS 0.0 03/08/2019 1945   EOSABS 0.1 06/30/2014 1501   BASOSABS 0.1 03/08/2019 1945   BASOSABS 0.0 06/30/2014 1501   Blood glucose 521 CMP     Component Value Date/Time   NA 137 03/08/2019 2159   NA 139 06/30/2014 1501   K 3.2 (L) 03/08/2019 2159   K 4.1 06/30/2014 1501   CL 96 (L) 03/08/2019 1945   CL 106 06/30/2014 1501   CO2 20 (L) 03/08/2019 1945   CO2 28 06/30/2014 1501   GLUCOSE 521 (HH) 03/08/2019 1945   GLUCOSE 153 (H) 06/30/2014 1501   BUN 15 03/08/2019 1945   BUN 13 06/30/2014 1501   CREATININE 0.70 03/08/2019 1950   CREATININE 0.64 06/30/2014 1501   CALCIUM 9.8 03/08/2019 1945   CALCIUM 9.1 06/30/2014 1501   PROT 7.7 03/08/2019 1945   ALBUMIN 4.0 03/08/2019 1945   AST 31 03/08/2019 1945   ALT 22 03/08/2019 1945   ALKPHOS 60 03/08/2019 1945   BILITOT 0.7 03/08/2019 1945   GFRNONAA 49 (L) 03/08/2019 1945   GFRNONAA >60 06/30/2014 1501    GFRAA 57 (L) 03/08/2019 1945   GFRAA >60 06/30/2014 1501   Imaging I have reviewed the images obtained:  CT-scan of the brain-no acute changes.  Chronic atrophy and chronic white matter changes.  CTA head and neck with no LVO.  CT perfusion with no perfusion deficits.  Assessment: 85 year old with past medical history of diabetes, hypertension, dementia modified Rankin score of 4, presented for evaluation of unresponsiveness which was preceded by seizure activity noted by family. EMS also witnessed generalized tonic-clonic seizure activity following no return to baseline. The patient continued to be obtunded on examination in the emergency room.  Imaging was unrevealing for stroke. Labs were revealing for a blood sugar level of greater than 500 and systolic blood pressures were greater than 200s. At this time, I suspect that she has had provoked seizures in the setting of hyperglycemia/DKA as well as  hypertensive emergency.\ Her exam does not suggest ongoing seizures.  But if anything were to change on her exam, would consider doing an emergent EEG   Impression: Provoke seizures in the setting of hypertensive emergency and DKA  Recommendations: Loaded with Keppra 1 g IV Continue Keppra 500 twice daily Maintain seizure precautions Management of DKA per primary team As far as blood pressure parameters, for the hypertensive emergency I would recommend bringing the blood pressures down below 180 and gradually reducing them to normal over the next couple of days. Check for any other toxic metabolic derangements including any signs of infection status check chest x-ray and UA. EEG in the morning-or earlier if concern for status. MRI brain without contrast unable to Neurology will follow with you.  -- Milon DikesAshish Radonna Bracher, MD [facility] Pager: [phone] If 7pm to 7am, please call on call as listed on AMION.   CRITICAL CARE ATTESTATION Performed by: Milon DikesAshish Macaela Presas, MD Total  critical care time: 45 minutes Critical care time was exclusive of separately billable procedures and treating other patients and/or supervising APPs/Residents/Students Critical care was necessary to treat or prevent imminent or life-threatening deterioration due to seizures, hypertensive emergency, hyperglycemia This patient is critically ill and at significant risk for neurological worsening and/or death and care requires constant monitoring. Critical care was time spent personally by me on the following activities: development of treatment plan with patient and/or surrogate as well as nursing, discussions with consultants, evaluation of patient's response to treatment, examination of patient, obtaining history from patient or surrogate, ordering and performing treatments and interventions, ordering and review of laboratory studies, ordering and review of radiographic studies, pulse oximetry, re-evaluation of patient's condition, participation in multidisciplinary rounds and medical decision making of high complexity in the care of this patient.    Addendum Spoke with the ICU team.  Patient does not have DKA but only severe hyperglycemia. Recommendations remain as above    -- Milon DikesAshish Shevonne Wolf, MD [facility] Pager: [phone] If 7pm to 7am, please call on call as listed on AMION.

## 2019-03-09 ENCOUNTER — Inpatient Hospital Stay (HOSPITAL_COMMUNITY): Payer: Medicare HMO

## 2019-03-09 DIAGNOSIS — E876 Hypokalemia: Secondary | ICD-10-CM

## 2019-03-09 DIAGNOSIS — G40901 Epilepsy, unspecified, not intractable, with status epilepticus: Secondary | ICD-10-CM

## 2019-03-09 DIAGNOSIS — R569 Unspecified convulsions: Secondary | ICD-10-CM

## 2019-03-09 DIAGNOSIS — I161 Hypertensive emergency: Secondary | ICD-10-CM

## 2019-03-09 DIAGNOSIS — G9340 Encephalopathy, unspecified: Secondary | ICD-10-CM

## 2019-03-09 DIAGNOSIS — E872 Acidosis: Secondary | ICD-10-CM

## 2019-03-09 LAB — BASIC METABOLIC PANEL
Anion gap: 15 (ref 5–15)
BUN: 9 mg/dL (ref 8–23)
CO2: 27 mmol/L (ref 22–32)
Calcium: 9.4 mg/dL (ref 8.9–10.3)
Chloride: 95 mmol/L — ABNORMAL LOW (ref 98–111)
Creatinine, Ser: 0.7 mg/dL (ref 0.44–1.00)
GFR calc Af Amer: >60 mL/min (ref >60)
GFR calc non Af Amer: >60 mL/min (ref >60)
Glucose, Bld: 207 mg/dL — ABNORMAL HIGH (ref 70–99)
Potassium: 4 mmol/L (ref 3.5–5.1)
Sodium: 137 mmol/L (ref 135–145)

## 2019-03-09 LAB — CBC
HCT: 36.3 % (ref 36.0–46.0)
Hemoglobin: 12.3 g/dL (ref 12.0–15.0)
MCH: 28.8 pg (ref 26.0–34.0)
MCHC: 33.9 g/dL (ref 30.0–36.0)
MCV: 85 fL (ref 80.0–100.0)
Platelets: 245 10*3/uL (ref 150–400)
RBC: 4.27 MIL/uL (ref 3.87–5.11)
RDW: 13 % (ref 11.5–15.5)
WBC: 11.6 10*3/uL — ABNORMAL HIGH (ref 4.0–10.5)
nRBC: 0 % (ref 0.0–0.2)

## 2019-03-09 LAB — PHOSPHORUS
Phosphorus: 2.7 mg/dL (ref 2.5–4.6)
Phosphorus: 4 mg/dL (ref 2.5–4.6)

## 2019-03-09 LAB — LACTIC ACID, PLASMA
Lactic Acid, Venous: 5.4 mmol/L (ref 0.5–1.9)
Lactic Acid, Venous: 4.6 mmol/L (ref 0.5–1.9)

## 2019-03-09 LAB — MRSA PCR SCREENING: MRSA by PCR: NEGATIVE

## 2019-03-09 LAB — GLUCOSE, CAPILLARY
Glucose-Capillary: 137 mg/dL — ABNORMAL HIGH (ref 70–99)
Glucose-Capillary: 209 mg/dL — ABNORMAL HIGH (ref 70–99)
Glucose-Capillary: 211 mg/dL — ABNORMAL HIGH (ref 70–99)
Glucose-Capillary: 219 mg/dL — ABNORMAL HIGH (ref 70–99)
Glucose-Capillary: 234 mg/dL — ABNORMAL HIGH (ref 70–99)
Glucose-Capillary: 245 mg/dL — ABNORMAL HIGH (ref 70–99)
Glucose-Capillary: 398 mg/dL — ABNORMAL HIGH (ref 70–99)
Glucose-Capillary: 56 mg/dL — ABNORMAL LOW (ref 70–99)

## 2019-03-09 LAB — MAGNESIUM
Magnesium: 1.6 mg/dL — ABNORMAL LOW (ref 1.7–2.4)
Magnesium: 2.1 mg/dL (ref 1.7–2.4)

## 2019-03-09 LAB — BRAIN NATRIURETIC PEPTIDE: B Natriuretic Peptide: 97.9 pg/mL (ref 0.0–100.0)

## 2019-03-09 LAB — BETA-HYDROXYBUTYRIC ACID: Beta-Hydroxybutyric Acid: 0.16 mmol/L (ref 0.05–0.27)

## 2019-03-09 LAB — PROCALCITONIN: Procalcitonin: <0.1 ng/mL

## 2019-03-09 LAB — HEMOGLOBIN A1C
Hgb A1c MFr Bld: 11.4 % — ABNORMAL HIGH (ref 4.8–5.6)
Mean Plasma Glucose: 280.48 mg/dL

## 2019-03-09 MED ORDER — POTASSIUM CHLORIDE 10 MEQ/100ML IV SOLN
10.0000 meq | INTRAVENOUS | Status: AC
  Administered 2019-03-09 (×3): 10 meq via INTRAVENOUS
  Filled 2019-03-09: qty 100

## 2019-03-09 MED ORDER — LABETALOL HCL 5 MG/ML IV SOLN
10.0000 mg | INTRAVENOUS | Status: DC | PRN
  Administered 2019-03-09 (×4): 10 mg via INTRAVENOUS
  Filled 2019-03-09 (×4): qty 4

## 2019-03-09 MED ORDER — DEXTROSE 50 % IV SOLN
25.0000 mL | Freq: Once | INTRAVENOUS | Status: AC
  Administered 2019-03-09: 25 mL via INTRAVENOUS

## 2019-03-09 MED ORDER — MAGNESIUM SULFATE 4 GM/100ML IV SOLN
4.0000 g | Freq: Once | INTRAVENOUS | Status: AC
  Administered 2019-03-09: 4 g via INTRAVENOUS
  Filled 2019-03-09: qty 100

## 2019-03-09 MED ORDER — GADOBUTROL 1 MMOL/ML IV SOLN
5.0000 mL | Freq: Once | INTRAVENOUS | Status: AC | PRN
  Administered 2019-03-09: 5 mL via INTRAVENOUS

## 2019-03-09 MED ORDER — INSULIN ASPART 100 UNIT/ML ~~LOC~~ SOLN
0.0000 [IU] | SUBCUTANEOUS | Status: DC
  Administered 2019-03-09: 5 [IU] via SUBCUTANEOUS
  Administered 2019-03-09: 08:00:00 15 [IU] via SUBCUTANEOUS
  Administered 2019-03-09: 5 [IU] via SUBCUTANEOUS
  Administered 2019-03-09: 3 [IU] via SUBCUTANEOUS
  Administered 2019-03-09 (×2): 5 [IU] via SUBCUTANEOUS
  Administered 2019-03-10 (×2): 2 [IU] via SUBCUTANEOUS
  Administered 2019-03-10: 5 [IU] via SUBCUTANEOUS
  Administered 2019-03-10: 2 [IU] via SUBCUTANEOUS
  Administered 2019-03-10: 5 [IU] via SUBCUTANEOUS
  Administered 2019-03-10 – 2019-03-11 (×3): 3 [IU] via SUBCUTANEOUS
  Administered 2019-03-11: 5 [IU] via SUBCUTANEOUS
  Administered 2019-03-11: 3 [IU] via SUBCUTANEOUS
  Administered 2019-03-12: 8 [IU] via SUBCUTANEOUS
  Administered 2019-03-12: 3 [IU] via SUBCUTANEOUS
  Administered 2019-03-12: 12:00:00 5 [IU] via SUBCUTANEOUS
  Administered 2019-03-12 (×2): 2 [IU] via SUBCUTANEOUS
  Administered 2019-03-13: 8 [IU] via SUBCUTANEOUS
  Administered 2019-03-13: 11 [IU] via SUBCUTANEOUS
  Administered 2019-03-13: 8 [IU] via SUBCUTANEOUS
  Administered 2019-03-13: 09:00:00 2 [IU] via SUBCUTANEOUS
  Administered 2019-03-13 – 2019-03-14 (×2): 8 [IU] via SUBCUTANEOUS
  Administered 2019-03-14: 3 [IU] via SUBCUTANEOUS
  Administered 2019-03-14: 5 [IU] via SUBCUTANEOUS
  Administered 2019-03-14: 2 [IU] via SUBCUTANEOUS

## 2019-03-09 MED ORDER — DEXTROSE 50 % IV SOLN
INTRAVENOUS | Status: AC
  Filled 2019-03-09: qty 50

## 2019-03-09 MED ORDER — POTASSIUM CHLORIDE 10 MEQ/100ML IV SOLN
10.0000 meq | INTRAVENOUS | Status: DC
  Administered 2019-03-09: 10 meq via INTRAVENOUS
  Filled 2019-03-09 (×2): qty 100

## 2019-03-09 MED ORDER — CHLORHEXIDINE GLUCONATE CLOTH 2 % EX PADS
6.0000 | MEDICATED_PAD | Freq: Every day | CUTANEOUS | Status: DC
  Administered 2019-03-09 – 2019-03-10 (×2): 6 via TOPICAL

## 2019-03-09 MED ORDER — LABETALOL HCL 5 MG/ML IV SOLN
INTRAVENOUS | Status: AC
  Filled 2019-03-09: qty 4

## 2019-03-09 MED ORDER — ORAL CARE MOUTH RINSE
15.0000 mL | Freq: Two times a day (BID) | OROMUCOSAL | Status: DC
  Administered 2019-03-09 – 2019-03-14 (×12): 15 mL via OROMUCOSAL

## 2019-03-09 MED ORDER — SODIUM CHLORIDE 0.9 % IV SOLN
INTRAVENOUS | Status: DC | PRN
  Administered 2019-03-09 – 2019-03-10 (×2): 250 mL via INTRAVENOUS
  Administered 2019-03-11: 500 mL via INTRAVENOUS

## 2019-03-09 NOTE — Progress Notes (Signed)
Patient family asking to be updated by MD on diagnostic tests completed today. Neurology paged. Spoke with Dr. Otelia Limes about calling and updating family and he said he would do so.

## 2019-03-09 NOTE — Progress Notes (Addendum)
NEURO HOSPITALIST PROGRESS NOTE   Subjective: Patient in bed asleep, BP 105/59 no labetalol given this shift per RN. Patient not following commands, but able to move all 4 extremities with good strength. Bilateral mittens restraints in place. No seizure like activity noted and none reported by RN.  Exam: Vitals:   03/09/19 0600 03/09/19 0801  BP: 137/73   Pulse: 74   Resp: 18   Temp:  98.4 F (36.9 C)  SpO2: 100%     General: Patient is obtunded,NAD HEENT: Normocephalic atraumatic CVS: S1-2 heard regular rate rhythm Lungs: NAD, Sats 100% on RA Extremities: Warm well perfused  Neurological exam Patient is obtunded, does not open eyes to voice or painful stimuli. Grimaces and moves arms to deep sternal rub. To noxious stimulation, she localizes briskly with the right extremity and slowly with the left upper extremity. She withdraws both lower extremities to noxious stimulation Cranial nerves: PERRL, does not blink to threat but attempts to keep her eyes closed when the examiner attempts to examine her. Face appears asymmetric with a right facial droop.  Withdrawal to noxious stimulation in all 4. Moves all extremities spontaneously but not to command.   Medications:  Scheduled: . Chlorhexidine Gluconate Cloth  6 each Topical Daily  . enoxaparin (LOVENOX) injection  40 mg Subcutaneous Daily  . insulin aspart  0-15 Units Subcutaneous Q4H  . mouth rinse  15 mL Mouth Rinse BID   Continuous: . sodium chloride    . 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 Electronically Signed   By: Sebastian Ache M.D.   On: 03/09/2019 09:12   Result Date:  03/09/2019 CLINICAL DATA:  Unresponsive.  Seizure. EXAM: CT ANGIOGRAPHY HEAD AND NECK CT PERFUSION BRAIN TECHNIQUE: Multidetector CT imaging of the head and neck was performed using the standard protocol during bolus administration of intravenous contrast. Multiplanar CT image reconstructions and MIPs were obtained to evaluate the vascular anatomy. Carotid stenosis measurements (when applicable) are obtained utilizing NASCET criteria, using the distal internal carotid diameter as the denominator. Multiphase CT imaging of the brain was performed following IV bolus contrast injection. Subsequent parametric perfusion maps were calculated using RAPID software. CONTRAST:  OMNIPAQUE IOHEXOL 350 MG/ML SOLN COMPARISON:  None. FINDINGS: CTA NECK FINDINGS Aortic arch: Incomplete imaging of the aortic arch. Mild arch atherosclerosis. Widely patent arch vessel origins with three-vessel arch configuration. Right carotid system: Patent with mild atherosclerotic plaque at the carotid bifurcation. No evidence of significant stenosis or dissection. Mild beading of the mid cervical ICA. Left carotid system: Patent with minimal plaque at the carotid bifurcation. No evidence significant stenosis or dissection. Minimal irregularity versus mild motion artifact in the mid cervical ICA. Vertebral arteries: Patent without evidence of stenosis or dissection. Mildly dominant right vertebral artery. Skeleton: Minimal cervical spondylosis. Other neck: No mass or enlarged lymph nodes identified. Upper chest: Mild biapical pleuroparenchymal scarring. Review of the MIP images confirms the above findings CTA HEAD FINDINGS Anterior circulation: The internal carotid arteries are widely patent from skull base to carotid termini. ACAs and MCAs are patent without evidence of proximal branch occlusion or significant proximal stenosis. No  aneurysm is identified. Posterior circulation: The intracranial vertebral arteries are widely patent to the  basilar. Patent bilateral PICA, left AICA, and bilateral SCA origins are identified. Right AICA is diminutive and not well seen. The basilar artery is widely patent. There is a diminutive right posterior communicating artery. PCAs are patent with branch vessel irregularity but no significant proximal stenosis. No aneurysm is identified. Venous sinuses: Not well evaluated due to arterial phase contrast timing. Anatomic variants: None. Review of the MIP images confirms the above findings CT Brain Perfusion Findings: ASPECTS: 10 CBF (<30%) Volume: 0mL Perfusion (Tmax>6.0s) volume: 0mL Mismatch Volume: 0mL Infarction Location:None IMPRESSION: 1. Minimal atherosclerosis in the head and neck without large vessel occlusion or significant stenosis. 2. Negative CT perfusion imaging. 3.  Aortic Atherosclerosis (ICD10-I70.0). Preliminary finding of no large vessel occlusion was communicated to Dr. Wilford Corner at 8:04 pmon 5/8/2020by text page via the [facility] messaging system. Electronically Signed: By: Sebastian Ache M.D. 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 Electronically Signed   By: Sebastian Ache M.D.   On: 03/09/2019 09:12   Result Date: 03/09/2019 CLINICAL DATA:  Unresponsive.  Seizure. EXAM: CT ANGIOGRAPHY HEAD AND NECK CT PERFUSION BRAIN TECHNIQUE: Multidetector CT imaging of the head and neck was performed using the standard protocol during bolus administration of intravenous contrast. Multiplanar CT image reconstructions and MIPs were obtained to evaluate the vascular anatomy. Carotid stenosis measurements (when applicable) are obtained utilizing NASCET criteria, using the distal internal carotid diameter as the denominator. Multiphase CT imaging of the brain was performed following IV bolus contrast injection. Subsequent parametric perfusion  maps were calculated using RAPID software. CONTRAST:  OMNIPAQUE IOHEXOL 350 MG/ML SOLN COMPARISON:  None. FINDINGS: CTA NECK FINDINGS Aortic arch: Incomplete imaging of the aortic arch. Mild arch atherosclerosis. Widely patent arch vessel origins with three-vessel arch configuration. Right carotid system: Patent with mild atherosclerotic plaque at the carotid bifurcation. No evidence of significant stenosis or dissection. Mild beading of the mid cervical ICA. Left carotid system: Patent with minimal plaque at the carotid bifurcation. No evidence significant stenosis or dissection. Minimal irregularity versus mild motion artifact in the mid cervical ICA. Vertebral arteries: Patent without evidence of stenosis or dissection. Mildly dominant right vertebral artery. Skeleton: Minimal cervical spondylosis. Other neck: No mass or enlarged lymph nodes identified. Upper chest: Mild biapical pleuroparenchymal scarring. Review of the MIP images confirms the above findings CTA HEAD FINDINGS Anterior circulation: The internal carotid arteries are widely patent from skull base to carotid termini. ACAs and MCAs are patent without evidence of proximal branch occlusion or significant proximal stenosis. No aneurysm is identified. Posterior circulation: The intracranial vertebral arteries are widely patent to the basilar. Patent bilateral PICA, left AICA, and bilateral SCA origins are identified. Right AICA is diminutive and not well seen. The basilar artery is widely patent. There is a diminutive right posterior communicating artery. PCAs are patent with branch vessel irregularity but no significant proximal stenosis. No aneurysm is identified. Venous sinuses: Not well evaluated due to arterial phase contrast timing. Anatomic variants: None. Review of the MIP images confirms the above findings CT Brain Perfusion Findings: ASPECTS: 10 CBF (<30%) Volume: 0mL Perfusion (Tmax>6.0s) volume: 0mL Mismatch Volume: 0mL Infarction  Location:None IMPRESSION: 1. Minimal atherosclerosis in the head and neck without large vessel occlusion or significant stenosis. 2. Negative CT perfusion  imaging. 3.  Aortic Atherosclerosis (ICD10-I70.0). Preliminary finding of no large vessel occlusion was communicated to Dr. Wilford Corner at 8:04 pmon 5/8/2020by text page via the [facility] messaging system. Electronically Signed: By: Sebastian Ache M.D. On: 03/08/2019 20:23   Mr Laqueta Jean NS Contrast  Result Date: 03/09/2019 CLINICAL DATA:  New onset seizures, altered mental status, and hypertensive emergency. Concern for PRES. EXAM: MRI HEAD WITHOUT AND WITH CONTRAST TECHNIQUE: Multiplanar, multiecho pulse sequences of the brain and surrounding structures were obtained without and with intravenous contrast. CONTRAST:  5 mL Gadavist COMPARISON:  Head CT, CTA, and CTP 03/08/2019 FINDINGS: Brain: No acute infarct, mass, midline shift, or extra-axial fluid collection is identified. There are innumerable chronic microhemorrhages throughout the cerebral hemispheres with sparing of the deep gray nuclei. Numerous chronic microhemorrhages are also present in the cerebellum. There is moderate diffuse cerebral volume loss with severe bilateral mesial temporal lobe atrophy. Periventricular white matter T2 hyperintensities are nonspecific but compatible with mild-to-moderate chronic small vessel ischemic disease. No cortical/subcortical T2 signal changes suggestive of posterior reversible encephalopathy syndrome are evident. No abnormal enhancement is identified. Vascular: Major intracranial vascular flow voids are preserved. Skull and upper cervical spine: No suspicious marrow lesion. Sinuses/Orbits: Bilateral cataract extraction. Clear paranasal sinuses. Trace bilateral mastoid effusions. Other: None. IMPRESSION: 1. No acute intracranial abnormality. 2. Chronic small vessel ischemic disease and cerebral atrophy including severe mesial temporal lobe atrophy. 3. Innumerable chronic  cerebral and cerebellar microhemorrhages which may be secondary to chronic hypertension, vasculitis, or cerebral amyloid angiopathy. Electronically Signed   By: Sebastian Ache M.D.   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 Electronically Signed   By: Sebastian Ache M.D.   On: 03/09/2019 09:12   Result Date: 03/09/2019 CLINICAL DATA:  Unresponsive.  Seizure. EXAM: CT ANGIOGRAPHY HEAD AND NECK CT PERFUSION BRAIN TECHNIQUE: Multidetector CT imaging of the head and neck was performed using the standard protocol during bolus administration of intravenous contrast. Multiplanar CT image reconstructions and MIPs were obtained to evaluate the vascular anatomy. Carotid stenosis measurements (when applicable) are obtained utilizing NASCET criteria, using the distal internal carotid diameter as the denominator. Multiphase CT imaging of the brain was performed following IV bolus contrast injection. Subsequent parametric perfusion maps were calculated using RAPID software. CONTRAST:  OMNIPAQUE IOHEXOL 350 MG/ML SOLN COMPARISON:  None. FINDINGS: CTA NECK FINDINGS Aortic arch: Incomplete imaging of the aortic arch. Mild arch atherosclerosis. Widely patent arch vessel origins with three-vessel arch configuration. Right carotid system: Patent with mild atherosclerotic plaque at the carotid bifurcation. No evidence of significant stenosis or dissection. Mild beading of the mid cervical ICA. Left carotid system: Patent with minimal plaque at the carotid bifurcation. No evidence significant stenosis or dissection. Minimal irregularity versus mild motion artifact in the mid cervical ICA. Vertebral arteries: Patent without evidence of stenosis or dissection. Mildly dominant right vertebral artery. Skeleton: Minimal cervical spondylosis. Other neck: No  mass or enlarged lymph nodes identified. Upper chest: Mild biapical pleuroparenchymal scarring. Review of the MIP images confirms the above findings CTA HEAD FINDINGS Anterior circulation: The internal carotid arteries are widely patent from skull base to carotid termini. ACAs and MCAs are patent without evidence of proximal branch occlusion or significant proximal stenosis. No aneurysm is identified. Posterior circulation: The intracranial vertebral arteries are widely patent to the basilar. Patent bilateral PICA, left AICA,  and bilateral SCA origins are identified. Right AICA is diminutive and not well seen. The basilar artery is widely patent. There is a diminutive right posterior communicating artery. PCAs are patent with branch vessel irregularity but no significant proximal stenosis. No aneurysm is identified. Venous sinuses: Not well evaluated due to arterial phase contrast timing. Anatomic variants: None. Review of the MIP images confirms the above findings CT Brain Perfusion Findings: ASPECTS: 10 CBF (<30%) Volume: 0mL Perfusion (Tmax>6.0s) volume: 0mL Mismatch Volume: 0mL Infarction Location:None IMPRESSION: 1. Minimal atherosclerosis in the head and neck without large vessel occlusion or significant stenosis. 2. Negative CT perfusion imaging. 3.  Aortic Atherosclerosis (ICD10-I70.0). Preliminary finding of no large vessel occlusion was communicated to Dr. Wilford CornerArora at 8:04 pmon 5/8/2020by text page via the [facility] messaging system. Electronically Signed: By: Sebastian AcheAllen  Grady M.D. On: 03/08/2019 20:23   Dg Chest Port 1 View  Result Date: 03/08/2019 CLINICAL DATA:  Recent seizure activity and altered mental status EXAM: PORTABLE CHEST 1 VIEW COMPARISON:  09/05/2018 FINDINGS: Cardiac shadows within normal limits. Aortic calcifications are again noted. The lungs are well aerated bilaterally without focal infiltrate. No acute bony abnormality is seen. IMPRESSION: No active disease. Electronically Signed   By: Alcide CleverMark   Lukens M.D.   On: 03/08/2019 20:59   Ct Head Code Stroke Wo Contrast  Result Date: 03/08/2019 CLINICAL DATA:  Code stroke. Unresponsive. Seizures. Right-sided gaze. EXAM: CT HEAD WITHOUT CONTRAST TECHNIQUE: Contiguous axial images were obtained from the base of the skull through the vertex without intravenous contrast. COMPARISON:  09/05/2018 FINDINGS: Brain: There is no evidence of acute infarct, intracranial hemorrhage, mass, midline shift, or extra-axial fluid collection. Basal ganglia calcifications are unchanged. Cerebral atrophy is unchanged with severe mesial temporal lobe atrophy again noted bilaterally. Patchy cerebral white matter hypodensities are similar to the prior study and nonspecific but compatible with moderate chronic small vessel ischemic disease. Vascular: Calcified atherosclerosis at the skull base. No hyperdense vessel. Skull: No fracture or focal osseous lesion. Sinuses/Orbits: Paranasal sinuses and mastoid air cells are clear. Bilateral cataract extraction. Other: None. ASPECTS [facility]([place] Stroke Program Early CT Score) - Ganglionic level infarction (caudate, lentiform nuclei, internal capsule, insula, M1-M3 cortex): 7 - Supraganglionic infarction (M4-M6 cortex): 3 Total score (0-10 with 10 being normal): 10 IMPRESSION: 1. No evidence of acute intracranial abnormality. 2. ASPECTS is 10. 3. Moderate chronic small vessel ischemic disease. Severe mesial temporal lobe atrophy. These results were communicated to Dr. Wilford CornerArora at 8:04 pm on 03/08/2019 by text page via the [facility] messaging system. Electronically Signed   By: Sebastian AcheAllen  Grady M.D.   On: 03/08/2019 20:06    Assessment: 85 year old with past medical history of diabetes, hypertension, dementia modified Rankin score of 4, presented for evaluation of unresponsiveness which was preceded by seizure activity noted by family. EMS also witnessed generalized tonic-clonic seizure activity following no return to baseline. The patient continued to be  obtunded on examination in the emergency room.  Imaging was unrevealing for stroke. Labs were revealing for a blood sugar level of greater than 500 and systolic blood pressures were greater than 200s. Most likely etiology for her presentation is provoked seizures in the setting of hypertensive emergency and hyperglycemia. 1. At this time, we suspect that she has had provoked seizures in the setting of hyperglycemia as well as hypertensive emergency. 2. Her exam does not suggest ongoing seizures. She is, however, postictal.   3. Patient was severely hyperglycemic, but not in DKA. 4. EEG is normal. No electrographic seizures seen.  5. MRI:  No acute abnormality,  Chronic small vessel disease and cerebral atrophy. Chronic cerebral and cerebellar microhemorrhages.  6. Chest x-ray: cardiac shadows WNL, no infiltrates.  7. UA: negative for UTI.  8. Potassium is much improved from yesterday. BG is elevated, but lower at 207 today.   Recommendations: -- Continue Keppra 500 twice daily -- Maintain seizure precautions -- Management of metabolic derangements per primary team  -- it is  recommended to bring the blood pressures down below 180 and gradually reduce  to normal over the next couple of days. -- Neurology will sign off. Please call if there are additional questions.    Valentina Lucks, MSN, NP-C [facility] [phone]   Electronically signed: Dr. Caryl Pina 03/09/2019, 9:49 AM

## 2019-03-09 NOTE — Progress Notes (Signed)
EEG Complete  Results Pending 

## 2019-03-09 NOTE — Procedures (Signed)
ELECTROENCEPHALOGRAM REPORT   Patient: Melanie Turner       Room #: 0Y11Z EEG No. ID: [id number] Age: 85 y.o.        Sex: female Referring Physician: Sabino Niemann Report Date:  03/09/2019        Interpreting Physician: Thana Farr  History: Heavenli Kear is an 85 y.o. female with new onset seizures  Medications:  Insulin, Keppra, Magnesium  Conditions of Recording:  This is a 21 channel routine scalp EEG performed with bipolar and monopolar montages arranged in accordance to the international 10/20 system of electrode placement. One channel was dedicated to EKG recording.  The patient is in the drowsy and asleep states.  Description:  The patient does not achieve wakefulness during the recording for a waking background rhythm to be evaluated.  The patient appears drowsy and asleep for the recording.  During drowse the background is low voltage, slow and poorly organized consisting mostly of iregular theta and beta activity.  The patient goes in to a light sleep with symmetrical sleep spindles, vertex central sharp transients and irregular slow activity.  No epileptiform activity is noted.   Hyperventilation and intermittent photic stimulation were not performed  IMPRESSION: Normal electroencephalogram, drowsy and asleep. There are no focal lateralizing or epileptiform features.   Thana Farr, MD Neurology [phone] 03/09/2019, 1:39 PM

## 2019-03-09 NOTE — Progress Notes (Signed)
NAMSharlette Dense:  Genesee Harpham, MRN:  [id number], DOB:  1932/02/24, LOS: 1 ADMISSION DATE:  03/08/2019, CONSULTATION DATE:  03/08/2019 REFERRING MD:  Adela LankFloyd- EM, CHIEF COMPLAINT:  AMS, seizure  Brief History   [age] F yo found unresponsive this evening by family member, 2 witnessed seizures by EMS, 1 witnessed clonic tonic seizure in ED. Patient hyperglycemic and hypertensive on presentation.   History of present illness   85 yo F PMH Dementia, DM2, HTN who presents to ED 03/08/19 via EMS. Patient was found unresponsive by family member this evening, last seen normal approximately noon, and was transported to ED via EMS. En route, patient sustained 2x tonic clonic seizures receiving 1.5mg  Ativan by EMS with cessation of seizure. In ED patient sustained additional tonic clonic seizure, receiving 1mg  Ativan. Patient daughter at bedside in ED, history obtained from her. Denies recent illness, weakness, n/v/d, change or loss of appetite. No recent sick contacts. No HA, dizziness, confusion differing from baseline. No SOB, wheezing, URI sx. No chest pain, palpitations, swelling. Daughter endorses mother's medication compliance via assistance from daughter and son daily.   Patient Hypertensive on presentation SBP 190s, and hyperglycemic BG 521, Lactate 7.3. CT Head negative for acute abnormality. Patient started on insulin gtt in ED. Of note, no ketones in urine, ABG 7.41/48.6/118/33/5/31  Past Medical History  Dementia HTN DM2  Significant Hospital Events   03/08/19> 3x tonic clonic seizure. Started on insulin gtt. Admitting to ICU   Consults:  Neurology PCCM  Procedures:    Significant Diagnostic Tests:  CT Head 5/8> temporal lobe atrophy, no acute abnormality   Micro Data:  5/8> SARS CoV2- negative   Antimicrobials:    Interim history/subjective:  Off insulin drip.  Currently on sliding scale insulin. Objective   Blood pressure 137/73, pulse 74, temperature 98.4 F (36.9 C), temperature source  Axillary, resp. rate 18, height 5\' 5"  (1.651 m), weight 56.6 kg, SpO2 100 %.        Intake/Output Summary (Last 24 hours) at 03/09/2019 1129 Last data filed at 03/09/2019 0500 Gross per 24 hour  Intake 2874.13 ml  Output 1500 ml  Net 1374.13 ml   Filed Weights   03/09/19 0100  Weight: 56.6 kg    Examination: General: Elderly frail female in no acute distress HEENT: No JVD or lymphadenopathy is appreciated Neuro: Grimaces to noxious stimuli and withdraw CV: s1s2 rrr, no m/r/g PULM: even/non-labored, lungs bilaterally decreased breath sounds in the bases WG:NFAOGI:soft, non-tender, bsx4 active  Extremities: warm/dry, negative edema  Skin: no rashes or lesions   Resolved Hospital Problem list     Assessment & Plan:   Seizure -etiology unknown. Possibly hyperglycemia related however do not favor DKA given absence of common lab findings associated with DKA. Possibly related to hypertension given SBP >190 at time of presentation -CT Head negative for acute abnormality  -associated elevated lactic acid 7.3 P Neurology is following Keppra 500 twice daily PRN Ativan Remain n.p.o. MRI has been ordered EEG per neurology Patient is a DNR Consider transfer to the progressive unit as she is a DNR.  Hyperglycemia, DM2 -presenting glucose 521 -lab findings thusfar do not align with DKA.  -Consider HHS given absent urine ketones, pH >7.3, bicarb >15, however glucose <600  -Patient on home metformin, believed to be compliant with Rx with assistance of daughter, son. Does not regularly check CBG at home  -Lactate 7.3 P Insulin drip is been turned off Scale insulin Lactic acid is improving  HTN, hypertensive urgency  Prolonged qtc on ECG -on home lisinopril -received IV labetalol in ED  P ICU monitoring Systolic blood pressure goal is 1 60-1 70 PRN labetalol Monitor QTC  Electrolyte Abnormalities Hypokalemia P Is been repleted Monitor electrolytes replete magnesium 03/09/2019    Dementia Delirium precautions Neurology is following  Elevated Lactic Acid -in setting of seizure, hyperglycemia, hypertensive urgency -WBC 8.2, low suspicion for infection  P Lactic acid is trending downward 7.4 down to 4.6 (2000 Monitor WBC count if normal consider starting antimicrobial and culture  Goals of Care: She is a DNR  Best practice:  Diet: NPO Pain/Anxiety/Delirium protocol (if indicated): n/a VAP protocol (if indicated): n/a DVT prophylaxis: lovenox GI prophylaxis: n/a Glucose control: insulin gtt to subcutaneous Mobility: bedrest Code Status: DNR/DNI Family Communication: Daughter at bedside in ED Disposition: Currently intensive care unit.  Hemodynamically stable but poorly responsive  Labs   CBC: Recent Labs  Lab 03/08/19 1945 03/08/19 2159 03/09/19 0435  WBC 8.2  --  11.6*  NEUTROABS 4.6  --   --   HGB 13.2 12.9 12.3  HCT 41.2 38.0 36.3  MCV 88.6  --  85.0  PLT 267  --  245    Basic Metabolic Panel: Recent Labs  Lab 03/08/19 1945 03/08/19 1950 03/08/19 2159 03/08/19 2340 03/09/19 0435  NA 136  --  137 137 137  K 3.0*  --  3.2* 2.9* 4.0  CL 96*  --   --  93* 95*  CO2 20*  --   --  18* 27  GLUCOSE 521*  --   --  501* 207*  BUN 15  --   --  14 9  CREATININE 1.04* 0.70  --  0.96 0.70  CALCIUM 9.8  --   --  9.8 9.4  MG  --   --   --  2.1 1.6*  PHOS  --   --   --  4.0 2.7   GFR: Estimated Creatinine Clearance: 46.8 mL/min (by C-G formula based on SCr of 0.7 mg/dL). Recent Labs  Lab 03/08/19 1945 03/08/19 2019 03/08/19 2336 03/09/19 0123 03/09/19 0435  PROCALCITON  --   --  <0.10  --   --   WBC 8.2  --   --   --  11.6*  LATICACIDVEN  --  7.3*  --  5.4* 4.6*    Liver Function Tests: Recent Labs  Lab 03/08/19 1945  AST 31  ALT 22  ALKPHOS 60  BILITOT 0.7  PROT 7.7  ALBUMIN 4.0   No results for input(s): LIPASE, AMYLASE in the last 168 hours. No results for input(s): AMMONIA in the last 168 hours.  ABG    Component  Value Date/Time   PHART 7.411 03/08/2019 2159   PCO2ART 48.6 (H) 03/08/2019 2159   PO2ART 118.0 (H) 03/08/2019 2159   HCO3 31.1 (H) 03/08/2019 2159   TCO2 33 (H) 03/08/2019 2159   O2SAT 99.0 03/08/2019 2159     Coagulation Profile: Recent Labs  Lab 03/08/19 1945  INR 1.1    Cardiac Enzymes: No results for input(s): CKTOTAL, CKMB, CKMBINDEX, TROPONINI in the last 168 hours.  HbA1C: Hgb A1c MFr Bld  Date/Time Value Ref Range Status  03/08/2019 11:28 PM 11.4 (H) 4.8 - 5.6 % Final    Comment:    (NOTE) Pre diabetes:          5.7%-6.4% Diabetes:              >6.4% Glycemic control for   <7.0%  adults with diabetes     CBG: Recent Labs  Lab 03/09/19 0047 03/09/19 0200 03/09/19 0344 03/09/19 0749 03/09/19 1125  GLUCAP 211* 209* 219* 398* 245*     Critical care time: 30 minutes     Brett Canales Amias Hutchinson ACNP [facility] P[facility] Pager [phone] till 1 pm If no answer page [phone] 03/09/2019, 11:29 AM

## 2019-03-09 NOTE — Progress Notes (Signed)
Day shift RN tried to call report X1. Will try again after shift change.

## 2019-03-09 NOTE — Progress Notes (Signed)
Nutrition Brief Note  Patient identified on the Malnutrition Screening Tool (MST) Report. Received score of 2.0. Pt was not able to be presented with screening questions d/t her AMS.   Per chart, pt is current 124.8 lbs. The most recent wt documented in chart prior to this admission was from OP appt in December, at which time she was 117 lbs.   Noted that, on admission, family had denied any n/v or change or loss in appetite.   Pt has only been without nutrition for 24-36 hrs. No nutrition interventions warranted at this time.  If nutrition issues arise, please consult RD.   Christophe Louis RD, LDN, CNSC Clinical Nutrition Available Tues-Sat via Pager: [phone] 03/09/2019 11:23 AM

## 2019-03-09 NOTE — Progress Notes (Signed)
Video call completed with Granddaughter, Enrique Sack. Patient not responding.

## 2019-03-09 NOTE — Progress Notes (Signed)
Assisted tele visit to patient with daughter and granddaughter.  Semone Orlov, Loni Beckwith, RN

## 2019-03-09 NOTE — Progress Notes (Signed)
Report called to 3W and given to [place], Charity fundraiser. Patient transported to 3W21. Alert to pain. Two PIV's C/D/I. Flushes and saline locked. Purewick intact. Skin intact. No belongings. SCD's on BLE. Room air. Family aware of transfer. Patient in bed that is locked and low. Call bell within reach.

## 2019-03-09 NOTE — Progress Notes (Signed)
Called MRI after patient arrived to the floor and again at 4:15. They will let nurse know when they have a machine available.

## 2019-03-09 NOTE — Progress Notes (Signed)
[facility] called. Pt BP >160 even after IV labetalol given.

## 2019-03-09 NOTE — ED Notes (Signed)
ED TO INPATIENT HANDOFF REPORT  ED Nurse Name and Phone #:  Molly MaduroRobert RN [phone]  S Name/Age/Gender Melanie Turner 85 y.o. female Room/Bed: TRACC/TRACC  Code Status   Code Status: DNR  Home/SNF/Other Home {Patient oriented to: LETHARGIC/SOMNOLENT Is this baseline? NO  Triage Complete: Triage complete  Chief Complaint CODE STROKE    Triage Note [facility] [place] [id number] found unresponsive by family at 630, witnessed 2 seizures by EMS, given 1.5 mg versed en route, on non-rebreather, no hx of seizure, R sided gaze.    Allergies No Known Allergies  Level of Care/Admitting Diagnosis ED Disposition    ED Disposition Condition Comment   Admit  [facility] [[id number]]  Level of Care: ICU [6]  Covid Evaluation: N/A  Diagnosis: Seizure (HCC) [205090]  Admitting Physician: Campbell RichesGAKE, STELLA BIYAKI [phone][[id number]]  Attending Physician: Campbell RichesGAKE, STELLA BIYAKI [phone][[id number]]  Estimated length of stay: past midnight tomorrow  Certification:: I certify this patient will need inpatient services for at least 2 midnights  PT Class (Do Not Modify): Inpatient [101]  PT Acc Code (Do Not Modify): Private [1]       B Medical/Surgery History Past Medical History:  Diagnosis Date  . Diabetes mellitus without complication (HCC)   . Hypertension    Past Surgical History:  Procedure Laterality Date  . THYROIDECTOMY       A IV Location/Drains/Wounds Patient Lines/Drains/Airways Status   Active Line/Drains/Airways    Name:   Placement date:   Placement time:   Site:   Days:   Peripheral IV 03/08/19 Left Antecubital   03/08/19    1958    Antecubital   less than 1   Peripheral IV 03/08/19 Right Antecubital   03/08/19    1959    Antecubital   less than 1          Intake/Output Last 24 hours  Intake/Output Summary (Last 24 hours) at 03/08/2019 2358 Last data filed at 03/08/2019 2159 Gross per 24 hour  Intake 1200 ml  Output 1100 ml  Net 100 ml     Labs/Imaging Results for orders placed or performed during the hospital encounter of 03/08/19 (from the past 48 hour(s))  Protime-INR     Status: None   Collection Time: 03/08/19  7:45 PM  Result Value Ref Range   Prothrombin Time 14.5 11.4 - 15.2 seconds   INR 1.1 0.8 - 1.2    Comment: (NOTE) INR goal varies based on device and disease states. Performed at [facility], [address] [place], [place] [address]   APTT     Status: None   Collection Time: 03/08/19  7:45 PM  Result Value Ref Range   aPTT 31 24 - 36 seconds    Comment: Performed at [facility], [address] [place], [place] [address]  CBC     Status: None   Collection Time: 03/08/19  7:45 PM  Result Value Ref Range   WBC 8.2 4.0 - 10.5 K/uL   RBC 4.65 3.87 - 5.11 MIL/uL   Hemoglobin 13.2 12.0 - 15.0 g/dL   HCT 65.741.2 84.636.0 - 96.246.0 %   MCV 88.6 80.0 - 100.0 fL   MCH 28.4 26.0 - 34.0 pg   MCHC 32.0 30.0 - 36.0 g/dL   RDW 95.212.8 84.111.5 - 32.415.5 %   Platelets 267 150 - 400 K/uL   nRBC 0.0 0.0 - 0.2 %    Comment: Performed at [facility], [address] [place], [place] [address]  Differential     Status: None   Collection Time: 03/08/19  7:45 PM  Result Value Ref Range   Neutrophils Relative % 55 %   Neutro Abs 4.6 1.7 - 7.7 K/uL   Lymphocytes Relative 37 %   Lymphs Abs 3.0 0.7 - 4.0 K/uL   Monocytes Relative 6 %   Monocytes Absolute 0.5 0.1 - 1.0 K/uL   Eosinophils Relative 1 %   Eosinophils Absolute 0.0 0.0 - 0.5 K/uL   Basophils Relative 1 %   Basophils Absolute 0.1 0.0 - 0.1 K/uL   Immature Granulocytes 0 %   Abs Immature Granulocytes 0.01 0.00 - 0.07 K/uL    Comment: Performed at [facility], [address], [place], [place] [address]  Comprehensive metabolic panel     Status: Abnormal   Collection Time: 03/08/19  7:45 PM  Result Value Ref Range   Sodium 136 135 - 145 mmol/L   Potassium 3.0 (L) 3.5 - 5.1 mmol/L   Chloride 96 (L) 98 - 111 mmol/L   CO2 20 (L) 22 - 32 mmol/L    Glucose, Bld 521 (HH) 70 - 99 mg/dL    Comment: CRITICAL RESULT CALLED TO, READ BACK BY AND VERIFIED WITH: J.GLOSTER RN 2018 03/08/2019 MCCORMICK K    BUN 15 8 - 23 mg/dL   Creatinine, Ser 0.98 (H) 0.44 - 1.00 mg/dL   Calcium 9.8 8.9 - 11.9 mg/dL   Total Protein 7.7 6.5 - 8.1 g/dL   Albumin 4.0 3.5 - 5.0 g/dL   AST 31 15 - 41 U/L   ALT 22 0 - 44 U/L   Alkaline Phosphatase 60 38 - 126 U/L   Total Bilirubin 0.7 0.3 - 1.2 mg/dL   GFR calc non Af Amer 49 (L) >60 mL/min   GFR calc Af Amer 57 (L) >60 mL/min   Anion gap 20 (H) 5 - 15    Comment: Performed at [facility], [address] [place], [place] [address]  I-stat Creatinine, ED     Status: None   Collection Time: 03/08/19  7:50 PM  Result Value Ref Range   Creatinine, Ser 0.70 0.44 - 1.00 mg/dL  Lactic acid, plasma     Status: Abnormal   Collection Time: 03/08/19  8:19 PM  Result Value Ref Range   Lactic Acid, Venous 7.3 (HH) 0.5 - 1.9 mmol/L    Comment: CRITICAL RESULT CALLED TO, READ BACK BY AND VERIFIED WITH: K.NEWNAM RN 2053 03/08/2019 MCCORMICK K Performed at [facility], [address] [place], [place] [address]   SARS Coronavirus 2 (CEPHEID - Performed in [facility] hospital lab), Hosp Order     Status: None   Collection Time: 03/08/19  8:37 PM  Result Value Ref Range   SARS Coronavirus 2 NEGATIVE NEGATIVE    Comment: (NOTE) If result is NEGATIVE SARS-CoV-2 target nucleic acids are NOT DETECTED. The SARS-CoV-2 RNA is generally detectable in upper and lower  respiratory specimens during the acute phase of infection. The lowest  concentration of SARS-CoV-2 viral copies this assay can detect is 250  copies / mL. A negative result does not preclude SARS-CoV-2 infection  and should not be used as the sole basis for treatment or other  patient management decisions.  A negative result may occur with  improper specimen collection / handling, submission of specimen other  than nasopharyngeal swab, presence  of viral mutation(s) within the  areas targeted by this assay, and inadequate number of viral copies  (<250 copies / mL). A negative result must be combined with clinical  observations, patient history, and epidemiological information. If result is POSITIVE SARS-CoV-2 target nucleic acids are DETECTED. The SARS-CoV-2 RNA is generally detectable in upper and lower  respiratory specimens dur ing the acute phase of infection.  Positive  results are indicative of active infection with SARS-CoV-2.  Clinical  correlation with patient history and other diagnostic information is  necessary to determine patient infection status.  Positive results do  not rule out bacterial infection or co-infection with other viruses. If result is PRESUMPTIVE POSTIVE SARS-CoV-2 nucleic acids MAY BE PRESENT.   A presumptive positive result was obtained on the submitted specimen  and confirmed on repeat testing.  While 2019 novel coronavirus  (SARS-CoV-2) nucleic acids may be present in the submitted sample  additional confirmatory testing may be necessary for epidemiological  and / or clinical management purposes  to differentiate between  SARS-CoV-2 and other Sarbecovirus currently known to infect humans.  If clinically indicated additional testing with an alternate test  methodology [phone]) is advised. The SARS-CoV-2 RNA is generally  detectable in upper and lower respiratory sp ecimens during the acute  phase of infection. The expected result is Negative. Fact Sheet for Patients:  BoilerBrush.com.cy Fact Sheet for Healthcare Providers: https://pope.com/ This test is not yet approved or cleared by the [place] FDA and has been authorized for detection and/or diagnosis of SARS-CoV-2 by FDA under an Emergency Use Authorization (EUA).  This EUA will remain in effect (meaning this test can be used) for the duration of the COVID-19 declaration under Section  564(b)(1) of the Act, 21 U.S.C. section 360bbb-3(b)(1), unless the authorization is terminated or revoked sooner. Performed at [facility], [address] [place], [place] [address]   Urinalysis, Routine w reflex microscopic     Status: Abnormal   Collection Time: 03/08/19  8:39 PM  Result Value Ref Range   Color, Urine COLORLESS (A) YELLOW   APPearance CLEAR CLEAR   Specific Gravity, Urine 1.021 1.005 - 1.030   pH 7.0 5.0 - 8.0   Glucose, UA >=500 (A) NEGATIVE mg/dL   Hgb urine dipstick NEGATIVE NEGATIVE   Bilirubin Urine NEGATIVE NEGATIVE   Ketones, ur NEGATIVE NEGATIVE mg/dL   Protein, ur NEGATIVE NEGATIVE mg/dL   Nitrite NEGATIVE NEGATIVE   Leukocytes,Ua NEGATIVE NEGATIVE   WBC, UA 0-5 0 - 5 WBC/hpf   Bacteria, UA NONE SEEN NONE SEEN    Comment: Performed at [facility], [address], [place], [place] [address]  CBG monitoring, ED     Status: Abnormal   Collection Time: 03/08/19  8:43 PM  Result Value Ref Range   Glucose-Capillary 475 (H) 70 - 99 mg/dL  CBG monitoring, ED     Status: Abnormal   Collection Time: 03/08/19  9:33 PM  Result Value Ref Range   Glucose-Capillary 453 (H) 70 - 99 mg/dL  I-STAT 7, (LYTES, BLD GAS, ICA, H+H)     Status: Abnormal   Collection Time: 03/08/19  9:59 PM  Result Value Ref Range   pH, Arterial 7.411 7.350 - 7.450   pCO2 arterial 48.6 (H) 32.0 - 48.0 mmHg   pO2, Arterial 118.0 (H) 83.0 - 108.0 mmHg   Bicarbonate 31.1 (H) 20.0 - 28.0 mmol/L   TCO2 33 (H) 22 - 32 mmol/L   O2 Saturation 99.0 %   Acid-Base Excess 5.0 (H) 0.0 -  2.0 mmol/L   Sodium 137 135 - 145 mmol/L   Potassium 3.2 (L) 3.5 - 5.1 mmol/L   Calcium, Ion 1.18 1.15 - 1.40 mmol/L   HCT 38.0 36.0 - 46.0 %   Hemoglobin 12.9 12.0 - 15.0 g/dL   Patient temperature 14.7 F    Sample type ARTERIAL   CBG monitoring, ED     Status: Abnormal   Collection Time: 03/08/19 10:43 PM  Result Value Ref Range   Glucose-Capillary 373 (H) 70 - 99 mg/dL  CBG monitoring, ED      Status: Abnormal   Collection Time: 03/08/19 11:40 PM  Result Value Ref Range   Glucose-Capillary 299 (H) 70 - 99 mg/dL   Ct Angio Head W Or Wo Contrast  Result Date: 03/08/2019 CLINICAL DATA:  Unresponsive.  Seizure. EXAM: CT ANGIOGRAPHY HEAD AND NECK CT PERFUSION BRAIN TECHNIQUE: Multidetector CT imaging of the head and neck was performed using the standard protocol during bolus administration of intravenous contrast. Multiplanar CT image reconstructions and MIPs were obtained to evaluate the vascular anatomy. Carotid stenosis measurements (when applicable) are obtained utilizing NASCET criteria, using the distal internal carotid diameter as the denominator. Multiphase CT imaging of the brain was performed following IV bolus contrast injection. Subsequent parametric perfusion maps were calculated using RAPID software. CONTRAST:  OMNIPAQUE IOHEXOL 350 MG/ML SOLN COMPARISON:  None. FINDINGS: CTA NECK FINDINGS Aortic arch: Incomplete imaging of the aortic arch. Mild arch atherosclerosis. Widely patent arch vessel origins with three-vessel arch configuration. Right carotid system: Patent with mild atherosclerotic plaque at the carotid bifurcation. No evidence of significant stenosis or dissection. Mild beading of the mid cervical ICA. Left carotid system: Patent with minimal plaque at the carotid bifurcation. No evidence significant stenosis or dissection. Minimal irregularity versus mild motion artifact in the mid cervical ICA. Vertebral arteries: Patent without evidence of stenosis or dissection. Mildly dominant right vertebral artery. Skeleton: Minimal cervical spondylosis. Other neck: No mass or enlarged lymph nodes identified. Upper chest: Mild biapical pleuroparenchymal scarring. Review of the MIP images confirms the above findings CTA HEAD FINDINGS Anterior circulation: The internal carotid arteries are widely patent from skull base to carotid termini. ACAs and MCAs are patent without evidence of  proximal branch occlusion or significant proximal stenosis. No aneurysm is identified. Posterior circulation: The intracranial vertebral arteries are widely patent to the basilar. Patent bilateral PICA, left AICA, and bilateral SCA origins are identified. Right AICA is diminutive and not well seen. The basilar artery is widely patent. There is a diminutive right posterior communicating artery. PCAs are patent with branch vessel irregularity but no significant proximal stenosis. No aneurysm is identified. Venous sinuses: Not well evaluated due to arterial phase contrast timing. Anatomic variants: None. Review of the MIP images confirms the above findings CT Brain Perfusion Findings: ASPECTS: 10 CBF (<30%) Volume: 0mL Perfusion (Tmax>6.0s) volume: 0mL Mismatch Volume: 0mL Infarction Location:None IMPRESSION: 1. Minimal atherosclerosis in the head and neck without large vessel occlusion or significant stenosis. 2. Negative CT perfusion imaging. 3.  Aortic Atherosclerosis (ICD10-I70.0). Preliminary finding of no large vessel occlusion was communicated to Dr. Wilford Corner at 8:04 pmon 5/8/2020by text page via the [facility] messaging system. Electronically Signed   By: Sebastian Ache M.D.   On: 03/08/2019 20:23   Ct Angio Neck W Or Wo Contrast  Result Date: 03/08/2019 CLINICAL DATA:  Unresponsive.  Seizure. EXAM: CT ANGIOGRAPHY HEAD AND NECK CT PERFUSION BRAIN TECHNIQUE: Multidetector CT imaging of the head and neck was performed using the standard  protocol during bolus administration of intravenous contrast. Multiplanar CT image reconstructions and MIPs were obtained to evaluate the vascular anatomy. Carotid stenosis measurements (when applicable) are obtained utilizing NASCET criteria, using the distal internal carotid diameter as the denominator. Multiphase CT imaging of the brain was performed following IV bolus contrast injection. Subsequent parametric perfusion maps were calculated using RAPID software. CONTRAST:   OMNIPAQUE IOHEXOL 350 MG/ML SOLN COMPARISON:  None. FINDINGS: CTA NECK FINDINGS Aortic arch: Incomplete imaging of the aortic arch. Mild arch atherosclerosis. Widely patent arch vessel origins with three-vessel arch configuration. Right carotid system: Patent with mild atherosclerotic plaque at the carotid bifurcation. No evidence of significant stenosis or dissection. Mild beading of the mid cervical ICA. Left carotid system: Patent with minimal plaque at the carotid bifurcation. No evidence significant stenosis or dissection. Minimal irregularity versus mild motion artifact in the mid cervical ICA. Vertebral arteries: Patent without evidence of stenosis or dissection. Mildly dominant right vertebral artery. Skeleton: Minimal cervical spondylosis. Other neck: No mass or enlarged lymph nodes identified. Upper chest: Mild biapical pleuroparenchymal scarring. Review of the MIP images confirms the above findings CTA HEAD FINDINGS Anterior circulation: The internal carotid arteries are widely patent from skull base to carotid termini. ACAs and MCAs are patent without evidence of proximal branch occlusion or significant proximal stenosis. No aneurysm is identified. Posterior circulation: The intracranial vertebral arteries are widely patent to the basilar. Patent bilateral PICA, left AICA, and bilateral SCA origins are identified. Right AICA is diminutive and not well seen. The basilar artery is widely patent. There is a diminutive right posterior communicating artery. PCAs are patent with branch vessel irregularity but no significant proximal stenosis. No aneurysm is identified. Venous sinuses: Not well evaluated due to arterial phase contrast timing. Anatomic variants: None. Review of the MIP images confirms the above findings CT Brain Perfusion Findings: ASPECTS: 10 CBF (<30%) Volume: 0mL Perfusion (Tmax>6.0s) volume: 0mL Mismatch Volume: 0mL Infarction Location:None IMPRESSION: 1. Minimal atherosclerosis in the head  and neck without large vessel occlusion or significant stenosis. 2. Negative CT perfusion imaging. 3.  Aortic Atherosclerosis (ICD10-I70.0). Preliminary finding of no large vessel occlusion was communicated to Dr. Wilford Corner at 8:04 pmon 5/8/2020by text page via the [facility] messaging system. Electronically Signed   By: Sebastian Ache M.D.   On: 03/08/2019 20:23   Ct Cerebral Perfusion W Contrast  Result Date: 03/08/2019 CLINICAL DATA:  Unresponsive.  Seizure. EXAM: CT ANGIOGRAPHY HEAD AND NECK CT PERFUSION BRAIN TECHNIQUE: Multidetector CT imaging of the head and neck was performed using the standard protocol during bolus administration of intravenous contrast. Multiplanar CT image reconstructions and MIPs were obtained to evaluate the vascular anatomy. Carotid stenosis measurements (when applicable) are obtained utilizing NASCET criteria, using the distal internal carotid diameter as the denominator. Multiphase CT imaging of the brain was performed following IV bolus contrast injection. Subsequent parametric perfusion maps were calculated using RAPID software. CONTRAST:  OMNIPAQUE IOHEXOL 350 MG/ML SOLN COMPARISON:  None. FINDINGS: CTA NECK FINDINGS Aortic arch: Incomplete imaging of the aortic arch. Mild arch atherosclerosis. Widely patent arch vessel origins with three-vessel arch configuration. Right carotid system: Patent with mild atherosclerotic plaque at the carotid bifurcation. No evidence of significant stenosis or dissection. Mild beading of the mid cervical ICA. Left carotid system: Patent with minimal plaque at the carotid bifurcation. No evidence significant stenosis or dissection. Minimal irregularity versus mild motion artifact in the mid cervical ICA. Vertebral arteries: Patent without evidence of stenosis or dissection. Mildly dominant right vertebral  artery. Skeleton: Minimal cervical spondylosis. Other neck: No mass or enlarged lymph nodes identified. Upper chest: Mild biapical pleuroparenchymal  scarring. Review of the MIP images confirms the above findings CTA HEAD FINDINGS Anterior circulation: The internal carotid arteries are widely patent from skull base to carotid termini. ACAs and MCAs are patent without evidence of proximal branch occlusion or significant proximal stenosis. No aneurysm is identified. Posterior circulation: The intracranial vertebral arteries are widely patent to the basilar. Patent bilateral PICA, left AICA, and bilateral SCA origins are identified. Right AICA is diminutive and not well seen. The basilar artery is widely patent. There is a diminutive right posterior communicating artery. PCAs are patent with branch vessel irregularity but no significant proximal stenosis. No aneurysm is identified. Venous sinuses: Not well evaluated due to arterial phase contrast timing. Anatomic variants: None. Review of the MIP images confirms the above findings CT Brain Perfusion Findings: ASPECTS: 10 CBF (<30%) Volume: 0mL Perfusion (Tmax>6.0s) volume: 0mL Mismatch Volume: 0mL Infarction Location:None IMPRESSION: 1. Minimal atherosclerosis in the head and neck without large vessel occlusion or significant stenosis. 2. Negative CT perfusion imaging. 3.  Aortic Atherosclerosis (ICD10-I70.0). Preliminary finding of no large vessel occlusion was communicated to Dr. Wilford Corner at 8:04 pmon 5/8/2020by text page via the [facility] messaging system. Electronically Signed   By: Sebastian Ache M.D.   On: 03/08/2019 20:23   Dg Chest Port 1 View  Result Date: 03/08/2019 CLINICAL DATA:  Recent seizure activity and altered mental status EXAM: PORTABLE CHEST 1 VIEW COMPARISON:  09/05/2018 FINDINGS: Cardiac shadows within normal limits. Aortic calcifications are again noted. The lungs are well aerated bilaterally without focal infiltrate. No acute bony abnormality is seen. IMPRESSION: No active disease. Electronically Signed   By: Alcide Clever M.D.   On: 03/08/2019 20:59   Ct Head Code Stroke Wo Contrast  Result  Date: 03/08/2019 CLINICAL DATA:  Code stroke. Unresponsive. Seizures. Right-sided gaze. EXAM: CT HEAD WITHOUT CONTRAST TECHNIQUE: Contiguous axial images were obtained from the base of the skull through the vertex without intravenous contrast. COMPARISON:  09/05/2018 FINDINGS: Brain: There is no evidence of acute infarct, intracranial hemorrhage, mass, midline shift, or extra-axial fluid collection. Basal ganglia calcifications are unchanged. Cerebral atrophy is unchanged with severe mesial temporal lobe atrophy again noted bilaterally. Patchy cerebral white matter hypodensities are similar to the prior study and nonspecific but compatible with moderate chronic small vessel ischemic disease. Vascular: Calcified atherosclerosis at the skull base. No hyperdense vessel. Skull: No fracture or focal osseous lesion. Sinuses/Orbits: Paranasal sinuses and mastoid air cells are clear. Bilateral cataract extraction. Other: None. ASPECTS [facility] Stroke Program Early CT Score) - Ganglionic level infarction (caudate, lentiform nuclei, internal capsule, insula, M1-M3 cortex): 7 - Supraganglionic infarction (M4-M6 cortex): 3 Total score (0-10 with 10 being normal): 10 IMPRESSION: 1. No evidence of acute intracranial abnormality. 2. ASPECTS is 10. 3. Moderate chronic small vessel ischemic disease. Severe mesial temporal lobe atrophy. These results were communicated to Dr. Wilford Corner at 8:04 pm on 03/08/2019 by text page via the [facility] messaging system. Electronically Signed   By: Sebastian Ache M.D.   On: 03/08/2019 20:06    Pending Labs Unresulted Labs (From admission, onward)    Start     Ordered   03/15/19 0500  Creatinine, serum  (enoxaparin (LOVENOX)    CrCl >/= 30 ml/min)  Weekly,   R    Comments:  while on enoxaparin therapy    03/08/19 2329   03/09/19 0500  CBC  Tomorrow morning,  R     03/08/19 2329   03/09/19 0500  Basic metabolic panel  Tomorrow morning,   R     03/08/19 2329   03/09/19 0500  Magnesium  Tomorrow  morning,   R     03/08/19 2329   03/09/19 0500  Phosphorus  Tomorrow morning,   R     03/08/19 2329   03/09/19 0119  Lactic acid, plasma  STAT Now then every 3 hours,   STAT     03/08/19 2340   03/08/19 2341  Magnesium  ONCE - STAT,   R     03/08/19 2340   03/08/19 2341  Phosphorus  ONCE - STAT,   R     03/08/19 2340   03/08/19 2340  Basic metabolic panel  ONCE - STAT,   R     03/08/19 2340   03/08/19 2339  Beta-hydroxybutyric acid  Add-on,   R     03/08/19 2339   03/08/19 2327  Procalcitonin  Once,   R     03/08/19 2329   03/08/19 2327  Brain natriuretic peptide  Once,   R     03/08/19 2329   03/08/19 2257  Hemoglobin A1c  Add-on,   R     03/08/19 2256   03/08/19 2146  Blood gas, arterial  Once,   STAT     03/08/19 2145          Vitals/Pain Today's Vitals   03/08/19 2251 03/08/19 2300 03/08/19 2315 03/08/19 2330  BP:  (!) 170/91 (!) 162/94 (!) 164/98  Pulse:  77 76 82  Resp:  (!) 32 20 (!) 22  Temp:      TempSrc:      SpO2:  100% 100% 100%  PainSc: Asleep       Isolation Precautions No active isolations  Medications Medications  insulin regular, human (MYXREDLIN) 100 units/ 100 mL infusion (2.4 Units/hr Intravenous Rate/Dose Change 03/08/19 2343)  potassium chloride 10 mEq in 100 mL IVPB (10 mEq Intravenous New Bag/Given 03/08/19 2334)  enoxaparin (LOVENOX) injection 40 mg (has no administration in time range)  albuterol (PROVENTIL) (2.5 MG/3ML) 0.083% nebulizer solution 2.5 mg (has no administration in time range)  ondansetron (ZOFRAN) injection 4 mg (has no administration in time range)  acetaminophen (TYLENOL) tablet 650 mg (has no administration in time range)  levETIRAcetam (KEPPRA) IVPB 500 mg/100 mL premix (has no administration in time range)  LORazepam (ATIVAN) injection 1 mg (has no administration in time range)  sodium chloride flush (NS) 0.9 % injection 3 mL (3 mLs Intravenous Given 03/08/19 1959)  iohexol (OMNIPAQUE) 350 MG/ML injection 100 mL (100 mLs  Intravenous Contrast Given 03/08/19 2002)  sodium chloride 0.9 % bolus 500 mL (0 mLs Intravenous Stopped 03/08/19 2125)  levETIRAcetam (KEPPRA) IVPB 1000 mg/100 mL premix (0 mg Intravenous Stopped 03/08/19 2110)  LORazepam (ATIVAN) injection 1 mg (1 mg Intravenous Given 03/08/19 2032)  ondansetron (ZOFRAN) injection 4 mg (4 mg Intravenous Given 03/08/19 2153)  labetalol (NORMODYNE) injection 20 mg (20 mg Intravenous Given 03/08/19 2153)  lactated ringers bolus 1,000 mL (1,000 mLs Intravenous New Bag/Given 03/08/19 2348)    Mobility walks     Focused Assessments DAUGHTER LEFT .   R Recommendations: See Admitting Provider Note  Report given to:   Additional Notes:

## 2019-03-09 NOTE — Progress Notes (Signed)
Inpatient Diabetes Program Recommendations  AACE/ADA: New Consensus Statement on Inpatient Glycemic Control (2015)  Target Ranges:  Prepandial:   less than 140 mg/dL      Peak postprandial:   less than 180 mg/dL (1-2 hours)      Critically ill patients:  140 - 180 mg/dL   Lab Results  Component Value Date   GLUCAP 398 (H) 03/09/2019   HGBA1C 11.4 (H) 03/08/2019    Review of Glycemic Control  Results for OLAJUMOKE, LOBERG (MRN [id number]) as of 03/09/2019 09:46  Ref. Range 03/09/2019 02:00 03/09/2019 03:44 03/09/2019 07:49  Glucose-Capillary Latest Ref Range: 70 - 99 mg/dL 007 (H) 121 (H) 975 (H)   Diabetes history: DM 2 Outpatient Diabetes medications: Metformin XR 1500 mg daily with supper Current orders for Inpatient glycemic control:  Novolog moderate correction q 4 hours Inpatient Diabetes Program Recommendations:     A1C indicates average blood sugars of 280 mg/dL in the past 2-3 months.  Beta-hydroxybutyric was WNL however blood sugars were >500 mg/dL.  Insulin drip stopped overnight and transitioned to Novolog moderate q 4 hours.  Likely needs the addition of basal insulin.   -Consider adding Levemir 6 units bid. May need insulin at d/c as well to control blood sugars, however note that "tight" glycemic control is not indicated in this patient due to age/dementia.   Thanks,  Beryl Meager, RN, BC-ADM Inpatient Diabetes Coordinator Pager [phone] (8a-5p)

## 2019-03-10 ENCOUNTER — Inpatient Hospital Stay (HOSPITAL_COMMUNITY): Payer: Medicare HMO

## 2019-03-10 LAB — BASIC METABOLIC PANEL
Anion gap: 12 (ref 5–15)
Anion gap: 26 — ABNORMAL HIGH (ref 5–15)
BUN: 11 mg/dL (ref 8–23)
BUN: 14 mg/dL (ref 8–23)
CO2: 25 mmol/L (ref 22–32)
CO2: 18 mmol/L — ABNORMAL LOW (ref 22–32)
Calcium: 9.1 mg/dL (ref 8.9–10.3)
Calcium: 9.8 mg/dL (ref 8.9–10.3)
Chloride: 98 mmol/L (ref 98–111)
Chloride: 93 mmol/L — ABNORMAL LOW (ref 98–111)
Creatinine, Ser: 0.74 mg/dL (ref 0.44–1.00)
Creatinine, Ser: 0.96 mg/dL (ref 0.44–1.00)
GFR calc Af Amer: >60 mL/min (ref >60)
GFR calc Af Amer: >60 mL/min (ref >60)
GFR calc non Af Amer: >60 mL/min (ref >60)
GFR calc non Af Amer: 54 mL/min — ABNORMAL LOW (ref >60)
Glucose, Bld: 152 mg/dL — ABNORMAL HIGH (ref 70–99)
Glucose, Bld: 501 mg/dL (ref 70–99)
Potassium: 3.1 mmol/L — ABNORMAL LOW (ref 3.5–5.1)
Potassium: 2.9 mmol/L — ABNORMAL LOW (ref 3.5–5.1)
Sodium: 135 mmol/L (ref 135–145)
Sodium: 137 mmol/L (ref 135–145)

## 2019-03-10 LAB — CBC WITH DIFFERENTIAL/PLATELET
Abs Immature Granulocytes: 0.02 10*3/uL (ref 0.00–0.07)
Basophils Absolute: 0 10*3/uL (ref 0.0–0.1)
Basophils Relative: 0 %
Eosinophils Absolute: 0 10*3/uL (ref 0.0–0.5)
Eosinophils Relative: 0 %
HCT: 34.3 % — ABNORMAL LOW (ref 36.0–46.0)
Hemoglobin: 11.5 g/dL — ABNORMAL LOW (ref 12.0–15.0)
Immature Granulocytes: 0 %
Lymphocytes Relative: 24 %
Lymphs Abs: 2.4 10*3/uL (ref 0.7–4.0)
MCH: 28.3 pg (ref 26.0–34.0)
MCHC: 33.5 g/dL (ref 30.0–36.0)
MCV: 84.5 fL (ref 80.0–100.0)
Monocytes Absolute: 0.7 10*3/uL (ref 0.1–1.0)
Monocytes Relative: 7 %
Neutro Abs: 6.8 10*3/uL (ref 1.7–7.7)
Neutrophils Relative %: 69 %
Platelets: 242 10*3/uL (ref 150–400)
RBC: 4.06 MIL/uL (ref 3.87–5.11)
RDW: 13.2 % (ref 11.5–15.5)
WBC: 10 10*3/uL (ref 4.0–10.5)
nRBC: 0 % (ref 0.0–0.2)

## 2019-03-10 LAB — GLUCOSE, CAPILLARY
Glucose-Capillary: 133 mg/dL — ABNORMAL HIGH (ref 70–99)
Glucose-Capillary: 146 mg/dL — ABNORMAL HIGH (ref 70–99)
Glucose-Capillary: 149 mg/dL — ABNORMAL HIGH (ref 70–99)
Glucose-Capillary: 149 mg/dL — ABNORMAL HIGH (ref 70–99)
Glucose-Capillary: 170 mg/dL — ABNORMAL HIGH (ref 70–99)
Glucose-Capillary: 205 mg/dL — ABNORMAL HIGH (ref 70–99)
Glucose-Capillary: 206 mg/dL — ABNORMAL HIGH (ref 70–99)

## 2019-03-10 LAB — HEPATIC FUNCTION PANEL
ALT: 19 U/L (ref 0–44)
AST: 27 U/L (ref 15–41)
Albumin: 3.1 g/dL — ABNORMAL LOW (ref 3.5–5.0)
Alkaline Phosphatase: 50 U/L (ref 38–126)
Bilirubin, Direct: 0.2 mg/dL (ref 0.0–0.2)
Indirect Bilirubin: 0.5 mg/dL (ref 0.3–0.9)
Total Bilirubin: 0.7 mg/dL (ref 0.3–1.2)
Total Protein: 6.2 g/dL — ABNORMAL LOW (ref 6.5–8.1)

## 2019-03-10 LAB — LACTIC ACID, PLASMA: Lactic Acid, Venous: 1.3 mmol/L (ref 0.5–1.9)

## 2019-03-10 LAB — MAGNESIUM: Magnesium: 2.1 mg/dL (ref 1.7–2.4)

## 2019-03-10 LAB — TROPONIN I: Troponin I: <0.03 ng/mL (ref <0.03)

## 2019-03-10 LAB — PHOSPHORUS: Phosphorus: 3.1 mg/dL (ref 2.5–4.6)

## 2019-03-10 NOTE — Evaluation (Signed)
Clinical/Bedside Swallow Evaluation Patient Details  Name: Melanie Turner MRN: [id number] Date of Birth: October 06, 1932  Today's Date: 03/10/2019 Time: SLP Start Time (ACUTE ONLY): 1100 SLP Stop Time (ACUTE ONLY): 1122 SLP Time Calculation (min) (ACUTE ONLY): 22 min  Past Medical History:  Past Medical History:  Diagnosis Date  . Diabetes mellitus without complication (HCC)   . Hypertension    Past Surgical History:  Past Surgical History:  Procedure Laterality Date  . THYROIDECTOMY     HPI:  85 year old with past medical history of diabetes, hypertension, dementia modified Rankin score of 4, presented for evaluation of unresponsiveness which was preceded by seizure activity noted by family. EMS also witnessed generalized tonic-clonic seizure activity following no return to baseline. The patient continued to be obtunded on examination in the emergency room.  Imaging was unrevealing for stroke. Labs were revealing for a blood sugar level of greater than 500 and systolic blood pressures were greater than 200s. Most likely etiology for her presentation is provoked seizures in the setting of hypertensive emergency and hyperglycemia   Assessment / Plan / Recommendation Clinical Impression  Pt presents with a suspected oropharyngeal dysphagia that is felt to be exacerbated by decreased mentation and fluctuating mental status. Pt with baseline dementia. Pt required continuous cueing for maintaining alertness. Pt with reduced labial seal with anterior loss with thin liquids, total assistance for feeding, and suspected delay in swallow initiation. No overt s/sx of aspiration were exhibited with PO trials of ice chips, thin liquids, nectar thick, and puree POs. Vocal quality remained clear. Pt unable to cognitively sequence for use of straw this date despite cues. Recommend dysphagia 1 (puree) and thin liquids with medicines crushed in puree. Full supervision and feeding assistance. Withhold PO if pt is  not alert. SLP to follow up for diet tolerance.   SLP Visit Diagnosis: Dysphagia, unspecified (R13.10)    Aspiration Risk  Mild aspiration risk;Moderate aspiration risk    Diet Recommendation   Dysphagia 1 (puree), thin liquids  Medication Administration: Crushed with puree    Other  Recommendations Oral Care Recommendations: Oral care BID   Follow up Recommendations 24 hour supervision/assistance      Frequency and Duration min 2x/week  1 week       Prognosis Prognosis for Safe Diet Advancement: Fair Barriers to Reach Goals: Severity of deficits      Swallow Study   General Date of Onset: 03/09/19 HPI: 85 year old with past medical history of diabetes, hypertension, dementia modified Rankin score of 4, presented for evaluation of unresponsiveness which was preceded by seizure activity noted by family. EMS also witnessed generalized tonic-clonic seizure activity following no return to baseline. The patient continued to be obtunded on examination in the emergency room.  Imaging was unrevealing for stroke. Labs were revealing for a blood sugar level of greater than 500 and systolic blood pressures were greater than 200s. Most likely etiology for her presentation is provoked seizures in the setting of hypertensive emergency and hyperglycemia Type of Study: Bedside Swallow Evaluation Previous Swallow Assessment: none on file Diet Prior to this Study: NPO Temperature Spikes Noted: Yes Respiratory Status: Room air History of Recent Intubation: No Behavior/Cognition: Lethargic/Drowsy;Requires cueing;Doesn't follow directions Oral Cavity Assessment: Dry Oral Care Completed by SLP: Yes Oral Cavity - Dentition: Missing dentition Vision: Impaired for self-feeding Self-Feeding Abilities: Total assist Patient Positioning: Upright in bed Baseline Vocal Quality: Low vocal intensity Volitional Cough: Cognitively unable to elicit Volitional Swallow: Unable to elicit    Oral/Motor/Sensory  Function Overall  Oral Motor/Sensory Function: Generalized oral weakness   Ice Chips Ice chips: Impaired Presentation: Spoon Oral Phase Impairments: Reduced labial seal;Reduced lingual movement/coordination Oral Phase Functional Implications: Prolonged oral transit Pharyngeal Phase Impairments: Suspected delayed Swallow;Multiple swallows   Thin Liquid Thin Liquid: Impaired Presentation: Cup Oral Phase Impairments: Reduced lingual movement/coordination Oral Phase Functional Implications: Prolonged oral transit;Right anterior spillage;Left anterior spillage Pharyngeal  Phase Impairments: Suspected delayed Swallow;Multiple swallows    Nectar Thick Nectar Thick Liquid: Impaired Presentation: Cup Oral Phase Impairments: Reduced labial seal;Reduced lingual movement/coordination Oral phase functional implications: Prolonged oral transit Pharyngeal Phase Impairments: Suspected delayed Swallow;Multiple swallows   Honey Thick Honey Thick Liquid: Not tested   Puree Puree: Impaired Presentation: Spoon Oral Phase Impairments: Reduced labial seal;Impaired mastication;Reduced lingual movement/coordination Oral Phase Functional Implications: Prolonged oral transit;Oral residue Pharyngeal Phase Impairments: Suspected delayed Swallow;Multiple swallows   Solid     Solid: Not tested      Annaleigh Steinmeyer E Omunique Pederson MA, CCC-SLP Acute Rehabilitation Services 03/10/2019,11:33 AM

## 2019-03-10 NOTE — Progress Notes (Signed)
Patient appears more alert and awake at this time. Family called and updated. Face time conference set up with [employer]. SLP at bedside.    Sim Boast, RN

## 2019-03-10 NOTE — Progress Notes (Signed)
Patient able to open eyes in response to voice and pain at time. She is responding to questions. Facial and oral care provided. Will continue to monitor.   Sim Boast, RN

## 2019-03-10 NOTE — Evaluation (Addendum)
Physical Therapy Evaluation Patient Details Name: Melanie Turner MRN: [id number] DOB: [birth year] Today's Date: 03/10/2019   History of Present Illness  33 F yo found unresponsive this evening by family member, 2 witnessed seizures by EMS, 1 witnessed clonic tonic seizure in ED. Patient hyperglycemic and hypertensive on presentation.   Clinical Impression  Spoke with daughter, Mrs. Melanie Turner re: patients PLOF. Daughter stated pt was amb with cane and functioning with supervision however she was becoming resistant to doing the necessary ADLs like bathing and ADLs in which they would then have to help her to make sure it got done. Updated dtr on patients current level of function requiring maxA to EOB due to no command follow and she stated she would be fine to take her home with her again. PT to cont to follow and progress mobility as able and con't to evaluate d/c recommendations.    Follow Up Recommendations Home health PT, 24/7 assist    Equipment Recommendations  Rolling walker, tub bench   Recommendations for Other Services       Precautions / Restrictions Precautions Precautions: Fall Restrictions Weight Bearing Restrictions: No      Mobility  Bed Mobility Overal bed mobility: Needs Assistance Bed Mobility: Supine to Sit;Sit to Supine     Supine to sit: Total assist Sit to supine: Total assist   General bed mobility comments: pt with no command follow or initiation however pt didn't resist when PT assist her with the transfer,  Transfers                 General transfer comment: defferred today  Ambulation/Gait             General Gait Details: defferred today  Stairs            Wheelchair Mobility    Modified Rankin (Stroke Patients Only)       Balance Overall balance assessment: Needs assistance Sitting-balance support: Feet supported;No upper extremity supported Sitting balance-[name] Scale: Poor Sitting balance - Comments: posterior  lean                                     Pertinent Vitals/Pain Pain Assessment: No/denies pain    Home Living Family/patient expects to be discharged to:: Private residence Living Arrangements: Children(son, dtr, neice) Available Help at Discharge: Available PRN/intermittently;Family Type of Home: House Home Access: Stairs to enter Entrance Stairs-Rails: None Entrance Stairs-Number of Steps: 1 Home Layout: One level Home Equipment: Cane - single point Additional Comments: PLOF and home set up provided by daughter via the phone    Prior Function Level of Independence: Needs assistance   Gait / Transfers Assistance Needed: uses cane  ADL's / Homemaking Assistance Needed: pt was able to bathe and feed self however due to dementia she would sometimes refuse and required assist to complete task  Comments: pt becoming resistant to do things and focusing on her 'job" she has forgotten she retired     Higher education careers adviser Dominance   Dominant Hand: Right    Extremity/Trunk Assessment   Upper Extremity Assessment Upper Extremity Assessment: Generalized weakness;Difficult to assess due to impaired cognition    Lower Extremity Assessment Lower Extremity Assessment: Generalized weakness;Difficult to assess due to impaired cognition    Cervical / Trunk Assessment Cervical / Trunk Assessment: Kyphotic  Communication   Communication: No difficulties  Cognition Arousal/Alertness: Awake/alert Behavior During Therapy: Flat affect Overall Cognitive Status: History  of cognitive impairments - at baseline                                 General Comments: has dementia, can become agitated      General Comments General comments (skin integrity, edema, etc.): VSS    Exercises     Assessment/Plan    PT Assessment Patient needs continued PT services  PT Problem List Decreased strength;Decreased range of motion;Decreased activity tolerance;Decreased  coordination;Decreased balance;Decreased mobility;Decreased cognition;Decreased knowledge of use of DME;Decreased safety awareness       PT Treatment Interventions DME instruction;Gait training;Functional mobility training;Therapeutic activities;Balance training;Therapeutic exercise;Cognitive remediation    PT Goals (Current goals can be found in the Care Plan section)  Acute Rehab PT Goals Patient Stated Goal: didn't state PT Goal Formulation: With family Time For Goal Achievement: 03/24/19 Potential to Achieve Goals: Fair    Frequency Min 3X/week   Barriers to discharge        Co-evaluation               AM-PAC PT "6 Clicks" Mobility  Outcome Measure Help needed turning from your back to your side while in a flat bed without using bedrails?: Total Help needed moving from lying on your back to sitting on the side of a flat bed without using bedrails?: Total Help needed moving to and from a bed to a chair (including a wheelchair)?: Total Help needed standing up from a chair using your arms (e.g., wheelchair or bedside chair)?: Total Help needed to walk in hospital room?: Total Help needed climbing 3-5 steps with a railing? : Total 6 Click Score: 6    End of Session   Activity Tolerance: Patient tolerated treatment well Patient left: in bed;with call bell/phone within reach;with bed alarm set Nurse Communication: Mobility status PT Visit Diagnosis: Unsteadiness on feet (R26.81);Difficulty in walking, not elsewhere classified (R26.2);Muscle weakness (generalized) (M62.81)    Time: 1000-1018 PT Time Calculation (min) (ACUTE ONLY): 18 min   Charges:   PT Evaluation $PT Eval Moderate Complexity: 1 Mod          Lewis ShockAshly Bentlee Drier, PT, DPT Acute Rehabilitation Services Pager #: [phone] Office #: [phone]   Iona Hansenshly M Tamiya Colello 03/10/2019, 12:56 PM

## 2019-03-10 NOTE — Progress Notes (Signed)
[facility] Progress Note  Patient: Melanie Turner YNW:[id number]   PCP: Lauro RegulusAnderson, Marshall W, MD DOB: 1932/05/25   DOA: 03/08/2019   DOS: 03/10/2019   Date of Service: the patient was seen and examined on 03/10/2019  Brief hospital course: Pt. with PMH of Dementia, HTN, type II DM; admitted on 03/08/2019, presented with complaint of seizures and unresponsiveness, was found to have status epilepticus. Currently further plan is continue current management.  Subjective: Unable to wake up, unable to follow any commands.  Assessment and Plan: Seizure -etiology unknown. Possibly hyperglycemia related however do not favor DKA given absence of common lab findings associated with DKA. Possibly related to hypertension given SBP >190 at time of presentation -CT Head negative for acute abnormality  -associated elevated lactic acid 7.3  Neurology is following Keppra 500 twice daily PRN Ativan Remain n.p.o. MRI has been ordered EEG per neurology Patient is a DNR Consider transfer to the progressive unit as she is a DNR.  Hyperglycemia, DM2 -presenting glucose 521 -lab findings thusfar do not align with DKA.  -Consider HHS given absent urine ketones, pH >7.3, bicarb >15, however glucose <600  -Patient on home metformin, believed to be compliant with Rx with assistance of daughter, son. Does not regularly check CBG at home  -Lactate 7.3 P Insulin drip is been turned off Scale insulin Lactic acid is improving  HTN, hypertensive urgency  Prolonged qtc on ECG -on home lisinopril -received IV labetalol in ED  P Systolic blood pressure goal is 1 60-1 70 PRN labetalol Monitor QTC  Electrolyte Abnormalities Hypokalemia P Is been repleted Monitor electrolytes  Dementia Delirium precautions Neurology is following  Elevated Lactic Acid -in setting of seizure, hyperglycemia, hypertensive urgency -WBC 8.2, low suspicion for infection  P Lactic acid is trending downward  Monitor WBC count if normal consider starting antimicrobial and culture  Goals of Care: She is a DNR  Diet: Dysphagia 1 diet per speech therapy DVT Prophylaxis: subcutaneous Heparin  Advance goals of care discussion: DNR DNI  Family Communication: no family was present at bedside, at the time of interview.   Disposition:  Discharge to be determined.  Consultants: Neurology, signed off, CCM primary admission Procedures: EEG  Scheduled Meds: . enoxaparin (LOVENOX) injection  40 mg Subcutaneous Daily  . insulin aspart  0-15 Units Subcutaneous Q4H  . mouth rinse  15 mL Mouth Rinse BID   Continuous Infusions: . sodium chloride 250 mL (03/10/19 1659)  . levETIRAcetam Stopped (03/10/19 0839)   PRN Meds: sodium chloride, acetaminophen, albuterol, LORazepam, ondansetron (ZOFRAN) IV Antibiotics: Anti-infectives (From admission, onward)   None       Objective: Physical Exam: Vitals:   03/10/19 0358 03/10/19 0743 03/10/19 1152 03/10/19 1535  BP: (!) 150/77 128/65 104/60 133/63  Pulse: 71 61 (!) 58 70  Resp: 18  16 16   Temp: 98.7 F (37.1 C) 98.1 F (36.7 C) 98.3 F (36.8 C) 98.4 F (36.9 C)  TempSrc: Oral Oral Oral Oral  SpO2: 100% 100% 99% 100%  Weight:      Height:        Intake/Output Summary (Last 24 hours) at 03/10/2019 1917 Last data filed at 03/10/2019 1700 Gross per 24 hour  Intake 504.59 ml  Output 300 ml  Net 204.59 ml   Filed Weights   03/09/19 0100  Weight: 56.6 kg   General: Unable to follow any commands. Appear in mild distress, affect appropriate Eyes: PERRL, Conjunctiva normal ENT: Oral Mucosa clear moist. Neck: difficult to assess  JVD, no Abnormal Mass Or lumps Cardiovascular: S1 and S2 Present, no Murmur, Peripheral Pulses Present Respiratory:normal respiratory effort, Bilateral Air entry equal and Decreased, no use of accessory muscle, Clear to Auscultation, no Crackles, no wheezes Abdomen: Bowel Sound present, Soft and no tenderness, no  hernia Skin: no redness, no Rash, n induration Extremities: no Pedal edema, no calf tenderness Neurologic: Grossly no focal neuro deficit. Bilaterally Equal motor strength  Data Reviewed: CBC: Recent Labs  Lab 03/08/19 1945 03/08/19 2159 03/09/19 0435 03/10/19 0514  WBC 8.2  --  11.6* 10.0  NEUTROABS 4.6  --   --  6.8  HGB 13.2 12.9 12.3 11.5*  HCT 41.2 38.0 36.3 34.3*  MCV 88.6  --  85.0 84.5  PLT 267  --  245 242   Basic Metabolic Panel: Recent Labs  Lab 03/08/19 1945 03/08/19 1950 03/08/19 2159 03/08/19 2340 03/09/19 0435 03/10/19 0514  NA 136  --  137 137 137 135  K 3.0*  --  3.2* 2.9* 4.0 3.1*  CL 96*  --   --  93* 95* 98  CO2 20*  --   --  18* 27 25  GLUCOSE 521*  --   --  501* 207* 152*  BUN 15  --   --  14 9 11   CREATININE 1.04* 0.70  --  0.96 0.70 0.74  CALCIUM 9.8  --   --  9.8 9.4 9.1  MG  --   --   --  2.1 1.6* 2.1  PHOS  --   --   --  4.0 2.7 3.1    Liver Function Tests: Recent Labs  Lab 03/08/19 1945 03/10/19 0514  AST 31 27  ALT 22 19  ALKPHOS 60 50  BILITOT 0.7 0.7  PROT 7.7 6.2*  ALBUMIN 4.0 3.1*   No results for input(s): LIPASE, AMYLASE in the last 168 hours. No results for input(s): AMMONIA in the last 168 hours. Coagulation Profile: Recent Labs  Lab 03/08/19 1945  INR 1.1   Cardiac Enzymes: Recent Labs  Lab 03/10/19 0514  TROPONINI <0.03   BNP (last 3 results) No results for input(s): PROBNP in the last 8760 hours. CBG: Recent Labs  Lab 03/09/19 2334 03/10/19 0356 03/10/19 0740 03/10/19 1149 03/10/19 1624  GLUCAP 170* 149* 146* 149* 133*   Studies: Dg Chest Port 1 View  Result Date: 03/10/2019 CLINICAL DATA:  Abnormal respiration. EXAM: PORTABLE CHEST 1 VIEW COMPARISON:  03/08/2019 FINDINGS: The patient is mildly rotated to the right. The cardiomediastinal silhouette is unchanged with normal heart size and aortic atherosclerosis noted. Chronic interstitial changes are most notable in the apices and similar to  previous studies. No acute airspace consolidation, edema, pleural effusion, pneumothorax is identified. No acute osseous abnormality is seen. IMPRESSION: No evidence of acute cardiopulmonary process. Electronically Signed   By: Sebastian Ache M.D.   On: 03/10/2019 07:59     Time spent: 35 minutes  Author: Lynden Oxford, MD [facility] 03/10/2019 7:17 PM  To reach On-call, see care teams to locate the attending and reach out to them via [email or web address]. If 7PM-7AM, please contact night-coverage If you still have difficulty reaching the attending provider, please page the [facility] (Director on Call) for [facility] on amion for assistance.

## 2019-03-10 NOTE — Progress Notes (Signed)
Assisted tele visit to patient with son.  Burna Atlas Ann, RN  

## 2019-03-11 LAB — GLUCOSE, CAPILLARY
Glucose-Capillary: 165 mg/dL — ABNORMAL HIGH (ref 70–99)
Glucose-Capillary: 194 mg/dL — ABNORMAL HIGH (ref 70–99)
Glucose-Capillary: 201 mg/dL — ABNORMAL HIGH (ref 70–99)
Glucose-Capillary: 221 mg/dL — ABNORMAL HIGH (ref 70–99)
Glucose-Capillary: 77 mg/dL (ref 70–99)
Glucose-Capillary: 94 mg/dL (ref 70–99)

## 2019-03-11 LAB — BASIC METABOLIC PANEL
Anion gap: 14 (ref 5–15)
BUN: 10 mg/dL (ref 8–23)
CO2: 25 mmol/L (ref 22–32)
Calcium: 8.7 mg/dL — ABNORMAL LOW (ref 8.9–10.3)
Chloride: 96 mmol/L — ABNORMAL LOW (ref 98–111)
Creatinine, Ser: 0.62 mg/dL (ref 0.44–1.00)
GFR calc Af Amer: >60 mL/min (ref >60)
GFR calc non Af Amer: >60 mL/min (ref >60)
Glucose, Bld: 147 mg/dL — ABNORMAL HIGH (ref 70–99)
Potassium: 3.3 mmol/L — ABNORMAL LOW (ref 3.5–5.1)
Sodium: 135 mmol/L (ref 135–145)

## 2019-03-11 LAB — CBC
HCT: 35.3 % — ABNORMAL LOW (ref 36.0–46.0)
Hemoglobin: 11.8 g/dL — ABNORMAL LOW (ref 12.0–15.0)
MCH: 28.6 pg (ref 26.0–34.0)
MCHC: 33.4 g/dL (ref 30.0–36.0)
MCV: 85.7 fL (ref 80.0–100.0)
Platelets: 255 10*3/uL (ref 150–400)
RBC: 4.12 MIL/uL (ref 3.87–5.11)
RDW: 13.2 % (ref 11.5–15.5)
WBC: 8.3 10*3/uL (ref 4.0–10.5)
nRBC: 0 % (ref 0.0–0.2)

## 2019-03-11 LAB — MAGNESIUM: Magnesium: 1.8 mg/dL (ref 1.7–2.4)

## 2019-03-11 MED ORDER — LEVETIRACETAM 500 MG PO TABS: 500.0000 mg | ORAL_TABLET | Freq: Two times a day (BID) | ORAL | Status: DC

## 2019-03-11 MED ORDER — POTASSIUM CHLORIDE CRYS ER 20 MEQ PO TBCR
40.0000 meq | EXTENDED_RELEASE_TABLET | Freq: Once | ORAL | Status: AC
  Administered 2019-03-11: 40 meq via ORAL
  Filled 2019-03-11: qty 2

## 2019-03-11 MED ORDER — LEVETIRACETAM 500 MG PO TABS
500.0000 mg | ORAL_TABLET | Freq: Two times a day (BID) | ORAL | Status: DC
  Administered 2019-03-11 – 2019-03-14 (×6): 500 mg via ORAL
  Filled 2019-03-11 (×6): qty 1

## 2019-03-11 NOTE — Progress Notes (Signed)
Patient able to eat approximately 65 percent of lunch and drank 1 cup of apple juice today.  Sim Boast, RN

## 2019-03-11 NOTE — Progress Notes (Signed)
[facility] Progress Note  Patient: Melanie Turner ZOX:[id number]   PCP: Lauro RegulusAnderson, Marshall W, MD DOB: [birth year]   DOA: 03/08/2019   DOS: 03/11/2019   Date of Service: the patient was seen and examined on 03/11/2019  Brief hospital course: Pt. with PMH of Dementia, HTN, type II DM; admitted on 03/08/2019, presented with complaint of seizures and unresponsiveness, was found to have status epilepticus. Currently further plan is continue current management.  Subjective: Awake and alert.  Answering all the questions appropriately.  No nausea no vomiting.  No acute events overnight.  Had a BM last night.  Assessment and Plan: Seizure -etiology unknown. Possibly hyperglycemia related however do not favor DKA given absence of common lab findings associated with DKA. Possibly related to hypertension given SBP >190 at time of presentation -CT Head negative for acute abnormality  -associated elevated lactic acid 7.3  Neurology is signed off Keppra 500 twice daily PRN Ativan Patient is a DNR Significant improvement in mentation for now.  Continue current plan.  Diet advancement per speech therapy.  Hyperglycemia, DM2 -presenting glucose 521 -lab findings thusfar do not align with DKA.  -Consider HHS given absent urine ketones, pH >7.3, bicarb >15, however glucose <600  -Patient on home metformin, believed to be compliant with Rx with assistance of daughter, son. Does not regularly check CBG at home  -Lactate 7.3 P Insulin drip is been turned off Scale insulin Lactic acid is improving  HTN, hypertensive urgency  Prolonged qtc on ECG -on home lisinopril -received IV labetalol in ED  P Systolic blood pressure goal is 160-1 70 PRN labetalol Monitor QTC  Electrolyte Abnormalities Hypokalemia P Is been repleted Monitor electrolytes  Dementia Delirium precautions Neurology is following  Elevated Lactic Acid -in setting of seizure, hyperglycemia, hypertensive urgency -WBC  8.2, low suspicion for infection  P Resolved monitor.  Goals of Care: She is a DNR  Diet: Dysphagia 1 diet per speech therapy DVT Prophylaxis: subcutaneous Heparin  Advance goals of care discussion: DNR DNI  Family Communication: no family was present at bedside, at the time of interview.   Disposition:  Discharge to be determined.  Consultants: Neurology, signed off, CCM primary admission Procedures: EEG  Scheduled Meds: . enoxaparin (LOVENOX) injection  40 mg Subcutaneous Daily  . insulin aspart  0-15 Units Subcutaneous Q4H  . levETIRAcetam  500 mg Oral BID  . mouth rinse  15 mL Mouth Rinse BID   Continuous Infusions: . sodium chloride Stopped (03/11/19 1216)   PRN Meds: sodium chloride, acetaminophen, albuterol, LORazepam, ondansetron (ZOFRAN) IV Antibiotics: Anti-infectives (From admission, onward)   None       Objective: Physical Exam: Vitals:   03/11/19 0450 03/11/19 0725 03/11/19 1159 03/11/19 1610  BP: 130/70 125/65 114/62 (!) (P) 156/77  Pulse: 70 69 64 (P) 77  Resp: 17 18 18  (P) 18  Temp: (!) 100.9 F (38.3 C) 97.9 F (36.6 C) 98.4 F (36.9 C) (P) 98.2 F (36.8 C)  TempSrc: Oral Axillary Axillary (P) Axillary  SpO2: 99% 99% 100% (P) 100%  Weight:      Height:        Intake/Output Summary (Last 24 hours) at 03/11/2019 1854 Last data filed at 03/11/2019 1300 Gross per 24 hour  Intake 766.53 ml  Output 800 ml  Net -33.47 ml   Filed Weights   03/09/19 0100  Weight: 56.6 kg   General: Unable to follow any commands. Appear in mild distress, affect appropriate Eyes: PERRL, Conjunctiva normal ENT: Oral Mucosa clear  moist. Neck: difficult to assess  JVD, no Abnormal Mass Or lumps Cardiovascular: S1 and S2 Present, no Murmur, Peripheral Pulses Present Respiratory:normal respiratory effort, Bilateral Air entry equal and Decreased, no use of accessory muscle, Clear to Auscultation, no Crackles, no wheezes Abdomen: Bowel Sound present, Soft and no  tenderness, no hernia Skin: no redness, no Rash, n induration Extremities: no Pedal edema, no calf tenderness Neurologic: Grossly no focal neuro deficit. Bilaterally Equal motor strength  Data Reviewed: CBC: Recent Labs  Lab 03/08/19 1945 03/08/19 2159 03/09/19 0435 03/10/19 0514 03/11/19 0527  WBC 8.2  --  11.6* 10.0 8.3  NEUTROABS 4.6  --   --  6.8  --   HGB 13.2 12.9 12.3 11.5* 11.8*  HCT 41.2 38.0 36.3 34.3* 35.3*  MCV 88.6  --  85.0 84.5 85.7  PLT 267  --  245 242 255   Basic Metabolic Panel: Recent Labs  Lab 03/08/19 1945 03/08/19 1950 03/08/19 2159 03/08/19 2340 03/09/19 0435 03/10/19 0514 03/11/19 0527  NA 136  --  137 137 137 135 135  K 3.0*  --  3.2* 2.9* 4.0 3.1* 3.3*  CL 96*  --   --  93* 95* 98 96*  CO2 20*  --   --  18* 27 25 25   GLUCOSE 521*  --   --  501* 207* 152* 147*  BUN 15  --   --  14 9 11 10   CREATININE 1.04* 0.70  --  0.96 0.70 0.74 0.62  CALCIUM 9.8  --   --  9.8 9.4 9.1 8.7*  MG  --   --   --  2.1 1.6* 2.1 1.8  PHOS  --   --   --  4.0 2.7 3.1  --     Liver Function Tests: Recent Labs  Lab 03/08/19 1945 03/10/19 0514  AST 31 27  ALT 22 19  ALKPHOS 60 50  BILITOT 0.7 0.7  PROT 7.7 6.2*  ALBUMIN 4.0 3.1*   No results for input(s): LIPASE, AMYLASE in the last 168 hours. No results for input(s): AMMONIA in the last 168 hours. Coagulation Profile: Recent Labs  Lab 03/08/19 1945  INR 1.1   Cardiac Enzymes: Recent Labs  Lab 03/10/19 0514  TROPONINI <0.03   BNP (last 3 results) No results for input(s): PROBNP in the last 8760 hours. CBG: Recent Labs  Lab 03/10/19 2341 03/11/19 0338 03/11/19 0720 03/11/19 1157 03/11/19 1613  GLUCAP 205* 94 201* 165* 221*   Studies: No results found.   Time spent: 35 minutes  Author: Lynden Oxford, MD [facility] 03/11/2019 6:54 PM  To reach On-call, see care teams to locate the attending and reach out to them via [email or web address]. If 7PM-7AM, please contact night-coverage If  you still have difficulty reaching the attending provider, please page the [facility] (Director on Call) for [facility] on amion for assistance.

## 2019-03-11 NOTE — Progress Notes (Signed)
  Speech Language Pathology Treatment: Dysphagia  Patient Details Name: Mansirat Pugliese MRN: [id number] DOB: [birth year] Today's Date: 03/11/2019 Time: 1537-9432 SLP Time Calculation (min) (ACUTE ONLY): 12 min  Assessment / Plan / Recommendation Clinical Impression  Pt seen to assess po tolerance and readiness for dietary advancement.  Note her negative CXR from yesterday.  She has few lower front dentition and single upper tooth on left.  Pt willing to accept peaches, applesauce and juice via tsp, cup and straw.  Prolonged vertical mastication pattern noted and pt benefited from liquids to aid oral clearance.  NO indication of aspiration noted.  She will need someone to feed her due to her weakness - she attempted to help SLP hold her cup but only reached upper sternal region.  Ability to perform adequate suction on straw observed x3.  Will advance diet to dys2/thin and will follow up to help maximize intake with adequacy of airway protection.  Pt's cognitive deficits decrease her ability to comprehend current medical situation.    Note appearance of lesion/? Blistering on upper lip and on right side of tongue - ? If she bit her tongue during her seizure.     HPI HPI: 85 year old with past medical history of diabetes, hypertension, dementia modified Rankin score of 4, presented for evaluation of unresponsiveness which was preceded by seizure activity noted by family. EMS also witnessed generalized tonic-clonic seizure activity following no return to baseline. The patient continued to be obtunded on examination in the emergency room.  Imaging was unrevealing for stroke. Labs were revealing for a blood sugar level of greater than 500 and systolic blood pressures were greater than 200s. Most likely etiology for her presentation is provoked seizures in the setting of hypertensive emergency and hyperglycemia      SLP Plan  Continue with current plan of care       Recommendations  Diet  recommendations: Dysphagia 2 (fine chop);Thin liquid Liquids provided via: Cup;Straw Medication Administration: Crushed with puree Supervision: Patient able to self feed Compensations: Slow rate;Small sips/bites;Other (Comment);Lingual sweep for clearance of pocketing(using liquids or puree to help orally transit solids) Postural Changes and/or Swallow Maneuvers: Seated upright 90 degrees;Upright 30-60 min after meal                Oral Care Recommendations: Oral care BID Follow up Recommendations: 24 hour supervision/assistance SLP Visit Diagnosis: Dysphagia, unspecified (R13.10) Plan: Continue with current plan of care       GO                Chales Abrahams 03/11/2019, 1:13 PM

## 2019-03-11 NOTE — Progress Notes (Signed)
Assisted tele visit to patient with family member.  Urvi Imes William, RN   

## 2019-03-11 NOTE — Evaluation (Addendum)
Occupational Therapy Evaluation Patient Details Name: Melanie Turner MRN: [id number] DOB: 05/28/1932 Today's Date: 03/11/2019    History of Present Illness 1982 F yo found unresponsive this evening by family member, 2 witnessed seizures by EMS, 1 witnessed clonic tonic seizure in ED. Patient hyperglycemic and hypertensive on presentation.    Clinical Impression   This [age] y/o female presents with the above. PT spoke with daughter after eval and daughter reports pt typically completing functional mobility using SPC, requires cues and intermittent assist to perform ADL, reports has recently been requiring increased assist as pt often refusing to perform ADL tasks. Pt presents supine in bed awake and agreeable to therapy session. Pt following most simple commands during session given intermittent multimodal cues, increased time and repetition. Pt tolerated sitting EOB approx 7-10 min, requires intermittent minA to maintain sitting balance due to occasional posterior and R lateral lean. Pt completed simple grooming ADL while seated EOB, requiring assist for balance during dynamic task. She was able to perform sit<>stand from EOB with maxA. Pt is appropriate for SNF level care/therapy services at time of discharge, however noted per PT note pt's daughter okay with bringing pt back home at current level of assist (pt requiring increased assist in PT eval vs OT eval today). Pt will benefit from continued acute OT services and [facility] 24hr hands on supervision/assist if to return home. Will follow.     Follow Up Recommendations  Supervision/Assistance - 24 hour;Home health OT    Equipment Recommendations  3 in 1 bedside commode           Precautions / Restrictions Precautions Precautions: Fall Restrictions Weight Bearing Restrictions: No      Mobility Bed Mobility Overal bed mobility: Needs Assistance Bed Mobility: Supine to Sit;Sit to Supine     Supine to  sit: Max assist Sit to supine: Total assist   General bed mobility comments: pt following some commands today, requires assist to initiates LEs towards EOB but pt initiates bringing trunk upright, requires assist to fully achieve and to scoot hips towards EOB   Transfers Overall transfer level: Needs assistance Equipment used: 1 person hand held assist Transfers: Sit to/from Stand Sit to Stand: Max assist         General transfer comment: pt completing x2 sit<>stand from EOB, only achieving partial stand during first attempt but able to rise to standing with second attempt. pt requires boosting assist as well as faciliation to bring hips upright; use of face to face method     Balance Overall balance assessment: Needs assistance Sitting-balance support: Feet supported;No upper extremity supported Sitting balance-[name] Scale: Poor Sitting balance - Comments: posterior and R lateral lean requiring intermittent external assist to maintain balance  Postural control: Right lateral lean;Posterior lean                                 ADL either performed or assessed with clinical judgement   ADL Overall ADL's : Needs assistance/impaired Eating/Feeding: Minimal assistance;Sitting   Grooming: Wash/dry face;Min guard;Minimal assistance;Sitting Grooming Details (indicate cue type and reason): requires minA for sitting balance EOB during performance of functional task                             Functional mobility during ADLs: Maximal assistance(sit<>stand only) General ADL Comments: pt with weakness, decreased sitting and standing  balance, and baseline cognitive impairments all impacting her functional performance.      Vision         Perception     Praxis      Pertinent Vitals/Pain Pain Assessment: No/denies pain     Hand Dominance Right   Extremity/Trunk Assessment         Cervical / Trunk Assessment Cervical / Trunk Assessment: Kyphotic    Communication Communication Communication: No difficulties   Cognition Arousal/Alertness: Awake/alert Behavior During Therapy: Flat affect Overall Cognitive Status: History of cognitive impairments - at baseline                                 General Comments: has dementia, can become agitated per chart; following simple commands today but intermittently requires cues/repetition to do so    General Comments       Exercises     Shoulder Instructions      Home Living Family/patient expects to be discharged to:: Private residence Living Arrangements: Children(son, dtr, neice) Available Help at Discharge: Available PRN/intermittently;Family Type of Home: House Home Access: Stairs to enter Secretary/administrator of Steps: 1 Entrance Stairs-Rails: None Home Layout: One level     Bathroom Shower/Tub: Chief Strategy Officer: Handicapped height     Home Equipment: Cane - single point   Additional Comments: PLOF and home set up provided by daughter via the phone      Prior Functioning/Environment Level of Independence: Needs assistance  Gait / Transfers Assistance Needed: uses cane ADL's / Homemaking Assistance Needed: pt was able to bathe and feed self however due to dementia she would sometimes refuse and required assist to complete task   Comments: pt becoming resistant to do things and focusing on her 'job" she has forgotten she retired        Pharmacist, community Problem List: Decreased strength;Decreased range of motion;Decreased activity tolerance;Impaired balance (sitting and/or standing);Decreased cognition;Decreased safety awareness      OT Treatment/Interventions: Self-care/ADL training;Therapeutic exercise;Neuromuscular education;DME and/or AE instruction;Therapeutic activities;Patient/family education;Balance training;Cognitive remediation/compensation    OT Goals(Current goals can be found in the care plan section) Acute Rehab OT Goals Patient  Stated Goal: didn't state, agreeable to working with therapy OT Goal Formulation: With patient Time For Goal Achievement: 03/25/19 Potential to Achieve Goals: Good  OT Frequency: Min 2X/week   Barriers to D/C:            Co-evaluation              AM-PAC OT "6 Clicks" Daily Activity     Outcome Measure Help from another person eating meals?: A Little Help from another person taking care of personal grooming?: A Lot Help from another person toileting, which includes using toliet, bedpan, or urinal?: Total Help from another person bathing (including washing, rinsing, drying)?: A Lot Help from another person to put on and taking off regular upper body clothing?: A Lot Help from another person to put on and taking off regular lower body clothing?: Total 6 Click Score: 11   End of Session Nurse Communication: Mobility status  Activity Tolerance: Patient tolerated treatment well Patient left: in bed;with call bell/phone within reach;with bed alarm set  OT Visit Diagnosis: Unsteadiness on feet (R26.81);Muscle weakness (generalized) (M62.81)                Time: 1610-9604 OT Time Calculation (min): 24 min Charges:  OT General Charges $OT Visit: 1 Visit  OT Evaluation $OT Eval Moderate Complexity: 1 Mod OT Treatments $Self Care/Home Management : 8-22 mins  Marcy Siren, OT Supplemental Rehabilitation Services Pager [phone] Office [phone]   Orlando Penner 03/11/2019, 2:07 PM

## 2019-03-11 NOTE — Progress Notes (Signed)
Patient appears more alert and conversant this AM but declined oral intake at this time. RN with several attempts to feed patient breakfast but she declined.   Sim Boast, RN

## 2019-03-12 LAB — GLUCOSE, CAPILLARY
Glucose-Capillary: 146 mg/dL — ABNORMAL HIGH (ref 70–99)
Glucose-Capillary: 147 mg/dL — ABNORMAL HIGH (ref 70–99)
Glucose-Capillary: 183 mg/dL — ABNORMAL HIGH (ref 70–99)
Glucose-Capillary: 209 mg/dL — ABNORMAL HIGH (ref 70–99)
Glucose-Capillary: 254 mg/dL — ABNORMAL HIGH (ref 70–99)
Glucose-Capillary: 263 mg/dL — ABNORMAL HIGH (ref 70–99)
Glucose-Capillary: 87 mg/dL (ref 70–99)

## 2019-03-12 LAB — BASIC METABOLIC PANEL
Anion gap: 11 (ref 5–15)
BUN: 10 mg/dL (ref 8–23)
CO2: 24 mmol/L (ref 22–32)
Calcium: 8.8 mg/dL — ABNORMAL LOW (ref 8.9–10.3)
Chloride: 100 mmol/L (ref 98–111)
Creatinine, Ser: 0.66 mg/dL (ref 0.44–1.00)
GFR calc Af Amer: >60 mL/min (ref >60)
GFR calc non Af Amer: >60 mL/min (ref >60)
Glucose, Bld: 215 mg/dL — ABNORMAL HIGH (ref 70–99)
Potassium: 3.7 mmol/L (ref 3.5–5.1)
Sodium: 135 mmol/L (ref 135–145)

## 2019-03-12 LAB — CBC
HCT: 39.9 % (ref 36.0–46.0)
Hemoglobin: 13.4 g/dL (ref 12.0–15.0)
MCH: 28.9 pg (ref 26.0–34.0)
MCHC: 33.6 g/dL (ref 30.0–36.0)
MCV: 86 fL (ref 80.0–100.0)
Platelets: 237 10*3/uL (ref 150–400)
RBC: 4.64 MIL/uL (ref 3.87–5.11)
RDW: 13.2 % (ref 11.5–15.5)
WBC: 7.6 10*3/uL (ref 4.0–10.5)
nRBC: 0 % (ref 0.0–0.2)

## 2019-03-12 MED ORDER — AMLODIPINE BESYLATE 5 MG PO TABS
5.0000 mg | ORAL_TABLET | Freq: Every day | ORAL | Status: DC
  Administered 2019-03-12 – 2019-03-14 (×3): 5 mg via ORAL
  Filled 2019-03-12 (×3): qty 1

## 2019-03-12 MED ORDER — LACTULOSE 10 GM/15ML PO SOLN
20.0000 g | Freq: Once | ORAL | Status: AC
  Administered 2019-03-12: 20 g via ORAL
  Filled 2019-03-12: qty 30

## 2019-03-12 MED ORDER — BISACODYL 10 MG RE SUPP
10.0000 mg | Freq: Once | RECTAL | Status: AC
  Administered 2019-03-12: 10 mg via RECTAL
  Filled 2019-03-12: qty 1

## 2019-03-12 MED ORDER — HYDRALAZINE HCL 20 MG/ML IJ SOLN
10.0000 mg | Freq: Once | INTRAMUSCULAR | Status: AC
  Administered 2019-03-12: 10 mg via INTRAVENOUS
  Filled 2019-03-12: qty 1

## 2019-03-12 MED ORDER — SENNOSIDES-DOCUSATE SODIUM 8.6-50 MG PO TABS
1.0000 | ORAL_TABLET | Freq: Two times a day (BID) | ORAL | Status: DC
  Administered 2019-03-12 – 2019-03-14 (×5): 1 via ORAL
  Filled 2019-03-12 (×5): qty 1

## 2019-03-12 NOTE — Progress Notes (Signed)
  Speech Language Pathology Treatment: Dysphagia  Patient Details Name: Melanie Turner MRN: [id number] DOB: [birth year] Today's Date: 03/12/2019 Time: 3646-8032 SLP Time Calculation (min) (ACUTE ONLY): 20 min  Assessment / Plan / Recommendation Clinical Impression  Pt alert this am, noted green tinged sleep in her eyes that she allowed SLP to clean.  Pt did not bring her toothbrush to her mouth with SLP hand over hand assist - she pushed against SLP but willing to allow SLP to provide care.    Pt will need full assist for oral care and to eat due to her level of dementia/cognitive deficits.  She did however self feed a few bites of graham cracker.  Prolonged but effective mastication/oral clearance noted.  Intake listed as 65% and WBC 7.6 at this time.  Would recommend when pt discharges hospital for her to consume finger foods for improved neurological input.  However suspect at this time, consumption of Ensures or Glucerna may be most efficient way for her to receive nutrition.  No indication of aspiration with all po - given her reliance on others for feeding however - she will remain high risk.    Pt also continues with abrasion or ulceration on upper labia and on right side of tongue - therefore advise use caution with oral care to prevent her from becoming resistant in future.      HPI HPI: 85 year old with past medical history of diabetes, hypertension, dementia modified Rankin score of 4, presented for evaluation of unresponsiveness which was preceded by seizure activity noted by family. EMS also witnessed generalized tonic-clonic seizure activity following no return to baseline. The patient continued to be obtunded on examination in the emergency room.  Imaging was unrevealing for stroke. Labs were revealing for a blood sugar level of greater than 500 and systolic blood pressures were greater than 200s. Most likely etiology for her presentation is provoked seizures in the setting of  hypertensive emergency and hyperglycemia      SLP Plan  Continue with current plan of care       Recommendations  Diet recommendations: Dysphagia 2 (fine chop);Thin liquid Liquids provided via: Straw;Cup Medication Administration: Whole meds with puree Supervision: Patient able to self feed;Full supervision/cueing for compensatory strategies(assist pt to self feed as able) Compensations: Slow rate;Small sips/bites;Other (Comment);Lingual sweep for clearance of pocketing(using liquids or puree to help orally transit solids) Postural Changes and/or Swallow Maneuvers: Seated upright 90 degrees;Upright 30-60 min after meal                Oral Care Recommendations: Oral care BID Follow up Recommendations: 24 hour supervision/assistance SLP Visit Diagnosis: Dysphagia, unspecified (R13.10) Plan: Continue with current plan of care       GO                Chales Abrahams 03/12/2019, 9:39 AM  Donavan Burnet, MS [facility] SLP Acute Rehab Services Pager [phone] Office [phone]

## 2019-03-12 NOTE — Progress Notes (Signed)
[facility] Progress Note  Patient: Melanie Turner WJX:[id number]   PCP: Lauro RegulusAnderson, Marshall W, MD DOB: 11-28-1931   DOA: 03/08/2019   DOS: 03/12/2019   Date of Service: the patient was seen and examined on 03/12/2019  Brief hospital course: Pt. with PMH of Dementia, HTN, type II DM; admitted on 03/08/2019, presented with complaint of seizures and unresponsiveness, was found to have status epilepticus. Currently further plan is continue current management.  Subjective: Patient patient is more fatigued and tired.  Nurse reports minimal oral intake.  No nausea no vomiting no fever no chills.  No chest pain.  No abdominal pain.  No BM reported for last 2 days.  Assessment and Plan: Seizure -etiology unknown. Possibly hyperglycemia related however do not favor DKA given absence of common lab findings associated with DKA. Possibly related to hypertension given SBP >190 at time of presentation -CT Head negative for acute abnormality  -associated elevated lactic acid 7.3 Neurology signed off Keppra 500 twice daily PRN Ativan Patient is a DNR Significant improvement in mentation for now.  Continue current plan.  Diet advancement per speech therapy.  Hyperglycemia, DM2 -presenting glucose 521 -lab findings thusfar do not align with DKA.  -Consider HHS given absent urine ketones, pH >7.3, bicarb >15, however glucose <600  -Patient on home metformin, believed to be compliant with Rx with assistance of daughter, son. Does not regularly check CBG at home  Insulin drip is been turned off Sliding scale insulin  HTN, hypertensive urgency  Prolonged qtc on ECG -on home lisinopril -received IV labetalol in ED  PRN labetalol Monitor QTC Adding scheduled blood pressure medications  Electrolyte Abnormalities Hypokalemia Is been repleted Monitor electrolytes  Dementia Delirium precautions Neurology is following  Elevated Lactic Acid -in setting of seizure, hyperglycemia, hypertensive  urgency -WBC 8.2, low suspicion for infection  P Resolved monitor.  Goals of Care: She is a DNR  Constipation. Bowel regimen initiated.  Diet: Dysphagia 1 diet per speech therapy DVT Prophylaxis: subcutaneous Heparin  Advance goals of care discussion: DNR DNI  Family Communication: no family was present at bedside, at the time of interview.   Disposition:  Discharge to be determined.  Consultants: Neurology, signed off, CCM primary admission Procedures: EEG  Scheduled Meds: . amLODipine  5 mg Oral Daily  . enoxaparin (LOVENOX) injection  40 mg Subcutaneous Daily  . insulin aspart  0-15 Units Subcutaneous Q4H  . levETIRAcetam  500 mg Oral BID  . mouth rinse  15 mL Mouth Rinse BID  . senna-docusate  1 tablet Oral BID   Continuous Infusions: . sodium chloride Stopped (03/11/19 2310)   PRN Meds: sodium chloride, acetaminophen, albuterol, LORazepam, ondansetron (ZOFRAN) IV Antibiotics: Anti-infectives (From admission, onward)   None       Objective: Physical Exam: Vitals:   03/12/19 0500 03/12/19 0821 03/12/19 1150 03/12/19 1542  BP:  (!) 169/78 (!) 165/80 (!) 146/80  Pulse:  87 90 92  Resp:  16 16 16   Temp:  98.2 F (36.8 C) 98.2 F (36.8 C) 99.9 F (37.7 C)  TempSrc:  Axillary Axillary Axillary  SpO2:  100% 100% 99%  Weight: 59.1 kg     Height:        Intake/Output Summary (Last 24 hours) at 03/12/2019 1816 Last data filed at 03/12/2019 1801 Gross per 24 hour  Intake 125.77 ml  Output 1050 ml  Net -924.23 ml   Filed Weights   03/09/19 0100 03/12/19 0500  Weight: 56.6 kg 59.1 kg   General:  Unable to follow any commands. Appear in mild distress, affect appropriate Eyes: PERRL, Conjunctiva normal ENT: Oral Mucosa clear moist. Neck: difficult to assess  JVD, no Abnormal Mass Or lumps Cardiovascular: S1 and S2 Present, no Murmur, Peripheral Pulses Present Respiratory:normal respiratory effort, Bilateral Air entry equal and Decreased, no use of  accessory muscle, Clear to Auscultation, no Crackles, no wheezes Abdomen: Bowel Sound present, Soft and no tenderness, no hernia Skin: no redness, no Rash, n induration Extremities: no Pedal edema, no calf tenderness Neurologic: Grossly no focal neuro deficit. Bilaterally Equal motor strength  Data Reviewed: CBC: Recent Labs  Lab 03/08/19 1945 03/08/19 2159 03/09/19 0435 03/10/19 0514 03/11/19 0527 03/12/19 0501  WBC 8.2  --  11.6* 10.0 8.3 7.6  NEUTROABS 4.6  --   --  6.8  --   --   HGB 13.2 12.9 12.3 11.5* 11.8* 13.4  HCT 41.2 38.0 36.3 34.3* 35.3* 39.9  MCV 88.6  --  85.0 84.5 85.7 86.0  PLT 267  --  245 242 255 237   Basic Metabolic Panel: Recent Labs  Lab 03/08/19 2340 03/09/19 0435 03/10/19 0514 03/11/19 0527 03/12/19 0501  NA 137 137 135 135 135  K 2.9* 4.0 3.1* 3.3* 3.7  CL 93* 95* 98 96* 100  CO2 18* 27 25 25 24   GLUCOSE 501* 207* 152* 147* 215*  BUN 14 9 11 10 10   CREATININE 0.96 0.70 0.74 0.62 0.66  CALCIUM 9.8 9.4 9.1 8.7* 8.8*  MG 2.1 1.6* 2.1 1.8  --   PHOS 4.0 2.7 3.1  --   --     Liver Function Tests: Recent Labs  Lab 03/08/19 1945 03/10/19 0514  AST 31 27  ALT 22 19  ALKPHOS 60 50  BILITOT 0.7 0.7  PROT 7.7 6.2*  ALBUMIN 4.0 3.1*   No results for input(s): LIPASE, AMYLASE in the last 168 hours. No results for input(s): AMMONIA in the last 168 hours. Coagulation Profile: Recent Labs  Lab 03/08/19 1945  INR 1.1   Cardiac Enzymes: Recent Labs  Lab 03/10/19 0514  TROPONINI <0.03   BNP (last 3 results) No results for input(s): PROBNP in the last 8760 hours. CBG: Recent Labs  Lab 03/12/19 0024 03/12/19 0430 03/12/19 0824 03/12/19 1157 03/12/19 1623  GLUCAP 87 183* 147* 209* 254*   Studies: No results found.   Time spent: 35 minutes  Author: Lynden Oxford, MD [facility] 03/12/2019 6:16 PM  To reach On-call, see care teams to locate the attending and reach out to them via [email or web address]. If 7PM-7AM, please  contact night-coverage If you still have difficulty reaching the attending provider, please page the [facility] (Director on Call) for [facility] on amion for assistance.

## 2019-03-12 NOTE — Progress Notes (Addendum)
Inpatient Diabetes Program Recommendations  AACE/ADA: New Consensus Statement on Inpatient Glycemic Control (2015)  Target Ranges:  Prepandial:   less than 140 mg/dL      Peak postprandial:   less than 180 mg/dL (1-2 hours)      Critically ill patients:  140 - 180 mg/dL   Lab Results  Component Value Date   GLUCAP 147 (H) 03/12/2019   HGBA1C 11.4 (H) 03/08/2019    Results for JANECIA, PURA (MRN [id number]) as of 03/12/2019 11:09  Ref. Range 03/11/2019 07:20 03/11/2019 11:57 03/11/2019 16:13 03/11/2019 19:28 03/11/2019 23:42 03/12/2019 00:24 03/12/2019 04:30 03/12/2019 08:24  Glucose-Capillary Latest Ref Range: 70 - 99 mg/dL 814 (H)  Novolog 3 units 165 (H)  Novolog 3 units  221 (H)  Novolog 5 units  194 (H)  Novolog 3 units  77 87 183 (H)  Novolog 3 units 147 (H)  Novolog 2 units      Review of Glycemic Control  Diabetes history: DM2  Outpatient Diabetes medications: Glucophage 500mg  daily (per med rec)  Current orders for Inpatient glycemic control: Novolog moderate scale (0-15 units) q4 hours    Admit glucose 521mg /dl. Per MD note likely HHS. Takes metformin at home does not regularly check CBG.   In the past 24 hours patient has received 19 units of Novolog correction Q4. Patient has diet ordered. Would recommend adding small amount of basal insulin and decreasing Novolog correction.     MD please consider the following inpatient insulin adjustments:   1. Add Lantus 6 units QHS (based on 0.1 units/kg)  2. Decrease Novolog scale to SENSITIVE (0-9 units) tid ac and (0-5 units) hs   Awaiting to see where patient will go at discharge (home verses SNF) . If patient is to be d/c home on insulin patient/family will need instruction - diabetes coordinator will follow.   -- Will follow during hospitalization.--  Jamelle Rushing RN, MSN Diabetes Coordinator Inpatient Glycemic Control Team Team Pager: [phone] (8am-5pm)

## 2019-03-12 NOTE — Progress Notes (Signed)
Physical Therapy Treatment Patient Details Name: Melanie Turner MRN: [id number] DOB: [birth year] Today's Date: 03/12/2019    History of Present Illness [age] F yo found unresponsive this evening by family member, 2 witnessed seizures by EMS, 1 witnessed clonic tonic seizure in ED. Patient hyperglycemic and hypertensive on presentation.     PT Comments    Pt agreeable to attempt sitting EOB however pt attentive to bed pads and adjusting once sitting EOB and fatigued quickly.  Pt requires assist for bed mobility and repositioning.  Pt also unable to maintain midline posture in sitting without external support.  Pt would benefit from 24/7 assist upon d/c.  Uncertain if family is able to provide current level of care, so if family is not able to assist pt, pt may need SNF.   Follow Up Recommendations  Supervision/Assistance - 24 hour;SNF     Equipment Recommendations  None recommended by PT    Recommendations for Other Services       Precautions / Restrictions Precautions Precautions: Fall    Mobility  Bed Mobility Overal bed mobility: Needs Assistance Bed Mobility: Supine to Sit;Sit to Supine     Supine to sit: Mod assist Sit to supine: Total assist   General bed mobility comments: pt attempting to follow some commands today, pt initiated getting to EOB however requiring assist, utilized bed pad for positioning, pt perseverating on bed pad so multiple attempts and semi sit to stands to reposition, pt fatigued and then requested return to supine, assist for repositioning upon return to supine  Transfers                    Ambulation/Gait                 Stairs             Wheelchair Mobility    Modified Rankin (Stroke Patients Only)       Balance Overall balance assessment: Needs assistance Sitting-balance support: Feet supported;No upper extremity supported Sitting balance-[name] Scale: Poor Sitting balance - Comments: requires trunk support most  of the time, able to sit upright briefly without external support however not consistent                                    Cognition Arousal/Alertness: Awake/alert Behavior During Therapy: WFL for tasks assessed/performed Overall Cognitive Status: History of cognitive impairments - at baseline                                 General Comments: has dementia, can become agitated per chart however no observation of this today; following simple commands today but intermittently requires cues/repetition to do so       Exercises      General Comments        Pertinent Vitals/Pain Pain Assessment: No/denies pain    Home Living                      Prior Function            PT Goals (current goals can now be found in the care plan section) Progress towards PT goals: Progressing toward goals    Frequency    Min 3X/week      PT Plan Discharge plan needs to be updated    Co-evaluation  AM-PAC PT "6 Clicks" Mobility   Outcome Measure  Help needed turning from your back to your side while in a flat bed without using bedrails?: Total Help needed moving from lying on your back to sitting on the side of a flat bed without using bedrails?: Total Help needed moving to and from a bed to a chair (including a wheelchair)?: Total Help needed standing up from a chair using your arms (e.g., wheelchair or bedside chair)?: Total Help needed to walk in hospital room?: Total Help needed climbing 3-5 steps with a railing? : Total 6 Click Score: 6    End of Session Equipment Utilized During Treatment: Gait belt Activity Tolerance: Patient tolerated treatment well Patient left: in bed;with call bell/phone within reach;with bed alarm set   PT Visit Diagnosis: Muscle weakness (generalized) (M62.81);Difficulty in walking, not elsewhere classified (R26.2)     Time: 1610-9604 PT Time Calculation (min) (ACUTE ONLY): 13 min  Charges:   $Therapeutic Activity: 8-22 mins                     Zenovia Jarred, PT, DPT Acute Rehabilitation Services Office: [phone] Pager: [phone]  Sarajane Jews 03/12/2019, 1:31 PM

## 2019-03-13 DIAGNOSIS — E1165 Type 2 diabetes mellitus with hyperglycemia: Secondary | ICD-10-CM

## 2019-03-13 DIAGNOSIS — I10 Essential (primary) hypertension: Secondary | ICD-10-CM

## 2019-03-13 LAB — CBC
HCT: 35.6 % — ABNORMAL LOW (ref 36.0–46.0)
Hemoglobin: 11.7 g/dL — ABNORMAL LOW (ref 12.0–15.0)
MCH: 28.3 pg (ref 26.0–34.0)
MCHC: 32.9 g/dL (ref 30.0–36.0)
MCV: 86 fL (ref 80.0–100.0)
Platelets: 259 10*3/uL (ref 150–400)
RBC: 4.14 MIL/uL (ref 3.87–5.11)
RDW: 13 % (ref 11.5–15.5)
WBC: 6.5 10*3/uL (ref 4.0–10.5)
nRBC: 0 % (ref 0.0–0.2)

## 2019-03-13 LAB — GLUCOSE, CAPILLARY
Glucose-Capillary: 284 mg/dL — ABNORMAL HIGH (ref 70–99)
Glucose-Capillary: 135 mg/dL — ABNORMAL HIGH (ref 70–99)
Glucose-Capillary: 270 mg/dL — ABNORMAL HIGH (ref 70–99)
Glucose-Capillary: 328 mg/dL — ABNORMAL HIGH (ref 70–99)
Glucose-Capillary: 48 mg/dL — ABNORMAL LOW (ref 70–99)
Glucose-Capillary: 67 mg/dL — ABNORMAL LOW (ref 70–99)
Glucose-Capillary: 148 mg/dL — ABNORMAL HIGH (ref 70–99)

## 2019-03-13 LAB — BASIC METABOLIC PANEL
Anion gap: 8 (ref 5–15)
BUN: 12 mg/dL (ref 8–23)
CO2: 26 mmol/L (ref 22–32)
Calcium: 8.9 mg/dL (ref 8.9–10.3)
Chloride: 100 mmol/L (ref 98–111)
Creatinine, Ser: 0.99 mg/dL (ref 0.44–1.00)
GFR calc Af Amer: >60 mL/min (ref >60)
GFR calc non Af Amer: 52 mL/min — ABNORMAL LOW (ref >60)
Glucose, Bld: 282 mg/dL — ABNORMAL HIGH (ref 70–99)
Potassium: 3.8 mmol/L (ref 3.5–5.1)
Sodium: 134 mmol/L — ABNORMAL LOW (ref 135–145)

## 2019-03-13 MED ORDER — INSULIN DETEMIR 100 UNIT/ML ~~LOC~~ SOLN
6.0000 [IU] | Freq: Every day | SUBCUTANEOUS | Status: DC
  Administered 2019-03-13 – 2019-03-14 (×2): 6 [IU] via SUBCUTANEOUS
  Filled 2019-03-13 (×2): qty 0.06

## 2019-03-13 MED ORDER — POLYETHYLENE GLYCOL 3350 17 G PO PACK
17.0000 g | PACK | Freq: Every day | ORAL | Status: DC
  Administered 2019-03-14: 17 g via ORAL
  Filled 2019-03-13: qty 1

## 2019-03-13 MED ORDER — SENNOSIDES-DOCUSATE SODIUM 8.6-50 MG PO TABS: 2.0000 | ORAL_TABLET | Freq: Every evening | ORAL | Status: DC | PRN

## 2019-03-13 NOTE — Progress Notes (Signed)
PROGRESS NOTE    Melanie Turner  OIB:[id number] DOB: 02-01-1932 DOA: 03/08/2019 PCP: Lauro Regulus, MD   Brief Narrative:  85 year old with history of essential hypertension, diabetes mellitus type 2 was brought to the hospital for evaluation of unresponsiveness concerning for seizure.  She was found to be status epilepticus.  She was also hyperglycemic without anion gap acidosis.  CT of the head was negative, elevated lactate level.  Neurology recommended starting the patient on 500 mg of Keppra twice daily.  Initially she was on insulin drip and transition to subcutaneous insulin for hyperglycemia.  Diabetic coordinator was consulted.  Her electrolyte abnormalities were fixed.   Assessment & Plan:   Active Problems:   Seizure (HCC)   Hyperglycemia with uncontrolled diabetes mellitus type 2 -Hemoglobin A1c is 11, she will require insulin at the time of discharge.  Given her mentation and likely inability to always concisely administer this, she will require home health RN and family's assistance.  I spoke with the patient daughter regarding this and she is in agreement. -She is still hyperglycemic therefore her insulin is being adjusted.  Add Levemir 6 units daily, sliding scale.  Metabolic encephalopathy likely status epilepticus with history of seizure -CT of the head is negative.  Neurology recommended Keppra 5 mg twice daily.  Ativan as needed.   Essential hypertension -Resume home meds  Advanced dementia -Basic precautions.   DVT prophylaxis: Lovenox Code Status: DNR Family Communication: Spoke with the patient daughter over the phone she is agreeable to having the patient come home with home health Disposition Plan: Once we have better control over her blood glucose, will discharge her.  Today she is not ready as she still remains hyperglycemic with inconsistent oral intake  Consultants:   Neurology  Procedures:   None  Antimicrobials:   None    Subjective: Poor oral intake over last 24 hours.  Off-and-on she still has moments of confusion and thinks that she still works. I had an extensive discussion with the patient's daughter over the phone and recently there has been concerns about worsening of her dementia and forgetfulness.  Review of Systems Otherwise negative except as per HPI, including: General: Denies fever, chills, night sweats or unintended weight loss. Resp: Denies cough, wheezing, shortness of breath. Cardiac: Denies chest pain, palpitations, orthopnea, paroxysmal nocturnal dyspnea. GI: Denies abdominal pain, nausea, vomiting, diarrhea or constipation GU: Denies dysuria, frequency, hesitancy or incontinence MS: Denies muscle aches, joint pain or swelling Neuro: Denies headache, neurologic deficits (focal weakness, numbness, tingling), abnormal gait Psych: Denies anxiety, depression, SI/HI/AVH Skin: Denies new rashes or lesions ID: Denies sick contacts, exotic exposures, travel  Objective: Vitals:   03/12/19 2331 03/13/19 0353 03/13/19 0732 03/13/19 1201  BP: (!) 119/57 (!) 106/57 106/60 109/64  Pulse: 80 70 63 79  Resp: 16 16 16 18   Temp: 98.1 F (36.7 C) 97.6 F (36.4 C) 97.9 F (36.6 C) 98 F (36.7 C)  TempSrc: Axillary Axillary Axillary Oral  SpO2: 100% 100% 96% 100%  Weight:      Height:        Intake/Output Summary (Last 24 hours) at 03/13/2019 1221 Last data filed at 03/13/2019 0900 Gross per 24 hour  Intake 360 ml  Output 600 ml  Net -240 ml   Filed Weights   03/09/19 0100 03/12/19 0500  Weight: 56.6 kg 59.1 kg    Examination:  General exam: Appears calm and comfortable  Respiratory system: Clear to auscultation. Respiratory effort normal. Cardiovascular system: S1 &  S2 heard, RRR. No JVD, murmurs, rubs, gallops or clicks. No pedal edema. Gastrointestinal system: Abdomen is nondistended, soft and nontender. No organomegaly or masses felt. Normal bowel sounds heard. Central nervous  system: Alert and oriented to her name only. No focal neurological deficits. Extremities: Symmetric 4+/5 in all extremities Skin: No rashes, lesions or ulcers Psychiatry: Poor judgment and insight overall    Data Reviewed:   CBC: Recent Labs  Lab 03/08/19 1945  03/09/19 0435 03/10/19 0514 03/11/19 0527 03/12/19 0501 03/13/19 0342  WBC 8.2  --  11.6* 10.0 8.3 7.6 6.5  NEUTROABS 4.6  --   --  6.8  --   --   --   HGB 13.2   < > 12.3 11.5* 11.8* 13.4 11.7*  HCT 41.2   < > 36.3 34.3* 35.3* 39.9 35.6*  MCV 88.6  --  85.0 84.5 85.7 86.0 86.0  PLT 267  --  245 242 255 237 259   < > = values in this interval not displayed.   Basic Metabolic Panel: Recent Labs  Lab 03/08/19 2340 03/09/19 0435 03/10/19 0514 03/11/19 0527 03/12/19 0501 03/13/19 0342  NA 137 137 135 135 135 134*  K 2.9* 4.0 3.1* 3.3* 3.7 3.8  CL 93* 95* 98 96* 100 100  CO2 18* 27 25 25 24 26   GLUCOSE 501* 207* 152* 147* 215* 282*  BUN 14 9 11 10 10 12   CREATININE 0.96 0.70 0.74 0.62 0.66 0.99  CALCIUM 9.8 9.4 9.1 8.7* 8.8* 8.9  MG 2.1 1.6* 2.1 1.8  --   --   PHOS 4.0 2.7 3.1  --   --   --    GFR: Estimated Creatinine Clearance: 38.1 mL/min (by C-G formula based on SCr of 0.99 mg/dL). Liver Function Tests: Recent Labs  Lab 03/08/19 1945 03/10/19 0514  AST 31 27  ALT 22 19  ALKPHOS 60 50  BILITOT 0.7 0.7  PROT 7.7 6.2*  ALBUMIN 4.0 3.1*   No results for input(s): LIPASE, AMYLASE in the last 168 hours. No results for input(s): AMMONIA in the last 168 hours. Coagulation Profile: Recent Labs  Lab 03/08/19 1945  INR 1.1   Cardiac Enzymes: Recent Labs  Lab 03/10/19 0514  TROPONINI <0.03   BNP (last 3 results) No results for input(s): PROBNP in the last 8760 hours. HbA1C: No results for input(s): HGBA1C in the last 72 hours. CBG: Recent Labs  Lab 03/12/19 1955 03/12/19 2342 03/13/19 0357 03/13/19 0729 03/13/19 1158  GLUCAP 146* 263* 284* 135* 270*   Lipid Profile: No results for  input(s): CHOL, HDL, LDLCALC, TRIG, CHOLHDL, LDLDIRECT in the last 72 hours. Thyroid Function Tests: No results for input(s): TSH, T4TOTAL, FREET4, T3FREE, THYROIDAB in the last 72 hours. Anemia Panel: No results for input(s): VITAMINB12, FOLATE, FERRITIN, TIBC, IRON, RETICCTPCT in the last 72 hours. Sepsis Labs: Recent Labs  Lab 03/08/19 2019 03/08/19 2336 03/09/19 0123 03/09/19 0435 03/10/19 0514  PROCALCITON  --  <0.10  --   --   --   LATICACIDVEN 7.3*  --  5.4* 4.6* 1.3    Recent Results (from the past 240 hour(s))  SARS Coronavirus 2 (CEPHEID - Performed in [facility] hospital lab), Hosp Order     Status: None   Collection Time: 03/08/19  8:37 PM  Result Value Ref Range Status   SARS Coronavirus 2 NEGATIVE NEGATIVE Final    Comment: (NOTE) If result is NEGATIVE SARS-CoV-2 target nucleic acids are NOT DETECTED. The SARS-CoV-2 RNA is  generally detectable in upper and lower  respiratory specimens during the acute phase of infection. The lowest  concentration of SARS-CoV-2 viral copies this assay can detect is 250  copies / mL. A negative result does not preclude SARS-CoV-2 infection  and should not be used as the sole basis for treatment or other  patient management decisions.  A negative result may occur with  improper specimen collection / handling, submission of specimen other  than nasopharyngeal swab, presence of viral mutation(s) within the  areas targeted by this assay, and inadequate number of viral copies  (<250 copies / mL). A negative result must be combined with clinical  observations, patient history, and epidemiological information. If result is POSITIVE SARS-CoV-2 target nucleic acids are DETECTED. The SARS-CoV-2 RNA is generally detectable in upper and lower  respiratory specimens dur ing the acute phase of infection.  Positive  results are indicative of active infection with SARS-CoV-2.  Clinical  correlation with patient history and other diagnostic  information is  necessary to determine patient infection status.  Positive results do  not rule out bacterial infection or co-infection with other viruses. If result is PRESUMPTIVE POSTIVE SARS-CoV-2 nucleic acids MAY BE PRESENT.   A presumptive positive result was obtained on the submitted specimen  and confirmed on repeat testing.  While 2019 novel coronavirus  (SARS-CoV-2) nucleic acids may be present in the submitted sample  additional confirmatory testing may be necessary for epidemiological  and / or clinical management purposes  to differentiate between  SARS-CoV-2 and other Sarbecovirus currently known to infect humans.  If clinically indicated additional testing with an alternate test  methodology [phone]) is advised. The SARS-CoV-2 RNA is generally  detectable in upper and lower respiratory sp ecimens during the acute  phase of infection. The expected result is Negative. Fact Sheet for Patients:  BoilerBrush.com.cy Fact Sheet for Healthcare Providers: https://pope.com/ This test is not yet approved or cleared by the [place] FDA and has been authorized for detection and/or diagnosis of SARS-CoV-2 by FDA under an Emergency Use Authorization (EUA).  This EUA will remain in effect (meaning this test can be used) for the duration of the COVID-19 declaration under Section 564(b)(1) of the Act, 21 U.S.C. section 360bbb-3(b)(1), unless the authorization is terminated or revoked sooner. Performed at [facility], [address], [place], [place] [address]   MRSA PCR Screening     Status: None   Collection Time: 03/09/19  1:03 AM  Result Value Ref Range Status   MRSA by PCR NEGATIVE NEGATIVE Final    Comment:        The GeneXpert MRSA Assay (FDA approved for NASAL specimens only), is one component of a comprehensive MRSA colonization surveillance program. It is not intended to diagnose MRSA infection nor to guide or  monitor treatment for MRSA infections. Performed at [facility], [address], [place], [place] [address]          Radiology Studies: No results found.      Scheduled Meds: . amLODipine  5 mg Oral Daily  . enoxaparin (LOVENOX) injection  40 mg Subcutaneous Daily  . insulin aspart  0-15 Units Subcutaneous Q4H  . levETIRAcetam  500 mg Oral BID  . mouth rinse  15 mL Mouth Rinse BID  . polyethylene glycol  17 g Oral Daily  . senna-docusate  1 tablet Oral BID   Continuous Infusions: . sodium chloride Stopped (03/11/19 2310)     LOS: 5 days  Time spent= 25 mins    Chief Walkup Joline Maxcy, MD [facility]  If 7PM-7AM, please contact night-coverage [email or web address] 03/13/2019, 12:21 PM

## 2019-03-13 NOTE — TOC Initial Note (Signed)
Transition of Care [facility]) - Initial/Assessment Note    Patient Details  Name: Melanie Turner MRN: [id number] Date of Birth: 19-Feb-1932  [facility] CM/SW Contact:    Kermit Balo, RN Phone Number: 03/13/2019, 4:32 PM  Clinical Narrative:                 CM spoke to pts daughter over the phone. They are planning to provide needed supervision.   Expected Discharge Plan: Home w Home Health Services Barriers to Discharge: Continued Medical Work up   Patient Goals and CMS Choice   CMS Medicare.gov Compare Post Acute Care list provided to:: Patient Represenative (must comment)(daughter) Choice offered to / list presented to : Adult Children  Expected Discharge Plan and Services Expected Discharge Plan: Home w Home Health Services   Discharge Planning Services: CM Consult Post Acute Care Choice: Home Health                             HH Arranged: RN, PT, OT, Social Work, Facilities manager Therapy HH Agency: [facility] Date HH Agency Contacted: 03/13/19 Time HH Agency Contacted: 1630 Representative spoke with at [facility] Agency: Lupita Leash  Prior Living Arrangements/Services   Lives with:: Adult Children(son and granddaughter)          Need for Family Participation in Patient Care: Yes (Comment) Care giver support system in place?: Yes (comment)(family able to provide supervision)   Criminal Activity/Legal Involvement Pertinent to Current Situation/Hospitalization: No - Comment as needed  Activities of Daily Living Home Assistive Devices/Equipment: None ADL Screening (condition at time of admission) Patient's cognitive ability adequate to safely complete daily activities?: No Is the patient deaf or have difficulty hearing?: No Does the patient have difficulty seeing, even when wearing glasses/contacts?: No Does the patient have difficulty concentrating, remembering, or making decisions?: Yes Patient able to express need for assistance with ADLs?:  No Does the patient have difficulty dressing or bathing?: Yes Independently performs ADLs?: No Communication: Dependent Is this a change from baseline?: Change from baseline, expected to last <3 days Dressing (OT): Dependent Is this a change from baseline?: Change from baseline, expected to last <3days Grooming: Dependent Is this a change from baseline?: Change from baseline, expected to last <3 days Feeding: Dependent Is this a change from baseline?: Change from baseline, expected to last <3 days Bathing: Dependent Is this a change from baseline?: Change from baseline, expected to last <3 days Toileting: Dependent Is this a change from baseline?: Change from baseline, expected to last <3 days In/Out Bed: Dependent Is this a change from baseline?: Change from baseline, expected to last <3 days Walks in Home: Dependent Is this a change from baseline?: Change from baseline, expected to last <3 days Does the patient have difficulty walking or climbing stairs?: Yes Weakness of Legs: Both Weakness of Arms/Hands: Both  Permission Sought/Granted                  Emotional Assessment       Orientation: : Oriented to Self   Psych Involvement: No (comment)  Admission diagnosis:  Status epilepticus (HCC) [G40.901] Patient Active Problem List   Diagnosis Date Noted  . Seizure (HCC) 03/08/2019   PCP:  Lauro Regulus, MD Pharmacy:   [facility] [phone] [address], [place] - [address] [place] [address] Phone: [phone] Fax: [phone]     Social Determinants of Health (SDOH) Interventions  Readmission Risk Interventions No flowsheet data found.

## 2019-03-13 NOTE — Progress Notes (Signed)
Inpatient Diabetes Program Recommendations  AACE/ADA: New Consensus Statement on Inpatient Glycemic Control   Target Ranges:  Prepandial:   less than 140 mg/dL      Peak postprandial:   less than 180 mg/dL (1-2 hours)      Critically ill patients:  140 - 180 mg/dL   Results for CLEVA, PARKISON (MRN [id number]) as of 03/13/2019 12:06  Ref. Range 03/12/2019 08:24 03/12/2019 11:57 03/12/2019 16:23 03/12/2019 19:55 03/12/2019 23:42 03/13/2019 03:57 03/13/2019 07:29  Glucose-Capillary Latest Ref Range: 70 - 99 mg/dL 014 (H)  Novolog 2 units 209 (H)  Novolog 5 units 254 (H)  Novolog 8 units 146 (H)  Novolog 2 units 263 (H)  Novolog 8 units 284 (H)  Novolog 8 units 135 (H)  Novolog 2 units   Review of Glycemic Control  Diabetes history: DM2 Outpatient Diabetes medications: Metformin XR 500 mg QAM Current orders for Inpatient glycemic control: Novolog 0-15 units Q4H  Inpatient Diabetes Program Recommendations:   Insulin - Basal: Over the past 24 hours, patient has received a total of Novolog 25 units for correction and glucose has ranged from 135-284 mg/dl. Please consider ordering Levemir 6 units Q24H (based on 59 kg x 0.1 units).  Thanks, Orlando Penner, RN, MSN, CDE Diabetes Coordinator Inpatient Diabetes Program [phone] (Team Pager from 8am to 5pm)

## 2019-03-14 DIAGNOSIS — I10 Essential (primary) hypertension: Secondary | ICD-10-CM

## 2019-03-14 DIAGNOSIS — E1129 Type 2 diabetes mellitus with other diabetic kidney complication: Secondary | ICD-10-CM

## 2019-03-14 DIAGNOSIS — E119 Type 2 diabetes mellitus without complications: Secondary | ICD-10-CM

## 2019-03-14 LAB — BASIC METABOLIC PANEL
Anion gap: 9 (ref 5–15)
BUN: 17 mg/dL (ref 8–23)
CO2: 27 mmol/L (ref 22–32)
Calcium: 9 mg/dL (ref 8.9–10.3)
Chloride: 99 mmol/L (ref 98–111)
Creatinine, Ser: 0.72 mg/dL (ref 0.44–1.00)
GFR calc Af Amer: >60 mL/min (ref >60)
GFR calc non Af Amer: >60 mL/min (ref >60)
Glucose, Bld: 164 mg/dL — ABNORMAL HIGH (ref 70–99)
Potassium: 3.7 mmol/L (ref 3.5–5.1)
Sodium: 135 mmol/L (ref 135–145)

## 2019-03-14 LAB — GLUCOSE, CAPILLARY
Glucose-Capillary: 140 mg/dL — ABNORMAL HIGH (ref 70–99)
Glucose-Capillary: 148 mg/dL — ABNORMAL HIGH (ref 70–99)
Glucose-Capillary: 154 mg/dL — ABNORMAL HIGH (ref 70–99)
Glucose-Capillary: 226 mg/dL — ABNORMAL HIGH (ref 70–99)
Glucose-Capillary: 272 mg/dL — ABNORMAL HIGH (ref 70–99)
Glucose-Capillary: 53 mg/dL — ABNORMAL LOW (ref 70–99)

## 2019-03-14 LAB — CBC
HCT: 36.2 % (ref 36.0–46.0)
Hemoglobin: 11.9 g/dL — ABNORMAL LOW (ref 12.0–15.0)
MCH: 28.3 pg (ref 26.0–34.0)
MCHC: 32.9 g/dL (ref 30.0–36.0)
MCV: 86 fL (ref 80.0–100.0)
Platelets: 301 10*3/uL (ref 150–400)
RBC: 4.21 MIL/uL (ref 3.87–5.11)
RDW: 13.1 % (ref 11.5–15.5)
WBC: 5.7 10*3/uL (ref 4.0–10.5)
nRBC: 0 % (ref 0.0–0.2)

## 2019-03-14 MED ORDER — BLOOD GLUCOSE MONITOR KIT: PACK | 0 refills | Status: DC

## 2019-03-14 MED ORDER — LEVETIRACETAM 500 MG PO TABS: 500.0000 mg | ORAL_TABLET | Freq: Two times a day (BID) | ORAL | 0 refills | Status: DC

## 2019-03-14 MED ORDER — LINAGLIPTIN 5 MG PO TABS: 5.0000 mg | ORAL_TABLET | Freq: Every day | ORAL | 0 refills | Status: DC

## 2019-03-14 MED ORDER — METFORMIN HCL 500 MG PO TABS: 500.0000 mg | ORAL_TABLET | Freq: Two times a day (BID) | ORAL | 0 refills | Status: DC

## 2019-03-14 MED ORDER — AMLODIPINE BESYLATE 5 MG PO TABS: 5.0000 mg | ORAL_TABLET | Freq: Every day | ORAL | 0 refills | Status: DC

## 2019-03-14 MED ORDER — INSULIN DETEMIR 100 UNIT/ML ~~LOC~~ SOLN: 6.0000 [IU] | Freq: Every day | SUBCUTANEOUS | 11 refills | Status: DC

## 2019-03-14 MED FILL — metFORMIN HCL 500 MG TABS: 500 | 30 days supply | Qty: 60 | Fill #0

## 2019-03-14 MED FILL — TRADJENTA 5 MG TABLET: 5 | 30 days supply | Qty: 30 | Fill #0

## 2019-03-14 MED FILL — AMLODIPINE BESYLATE 5 MG TA: 5 | 30 days supply | Qty: 30 | Fill #0

## 2019-03-14 MED FILL — levETIRAcetam 500 MG TABS: 500 | 30 days supply | Qty: 60 | Fill #0

## 2019-03-14 NOTE — Progress Notes (Signed)
Physical Therapy Treatment Patient Details Name: Melanie Turner MRN: [id number] DOB: Jul 08, 1932 Today's Date: 03/14/2019    History of Present Illness [age] F yo found unresponsive this evening by family member, 2 witnessed seizures by EMS, 1 witnessed clonic tonic seizure in ED. Patient hyperglycemic and hypertensive on presentation.     PT Comments    Pt was agreeable to stand and ambulate over to chair today however she is very unsteady with posterior lean and fearful of falling. She would stop at time and reach for bed rail and she required constant cuing and encouragement to continue ambulating around bed to chair. She will likely need SNF unless she progresses her mobility and balance   Follow Up Recommendations  Supervision/Assistance - 24 hour;SNF           Precautions / Restrictions Precautions Precautions: Fall Restrictions Weight Bearing Restrictions: No    Mobility  Bed Mobility Overal bed mobility: Needs Assistance Bed Mobility: Supine to Sit     Supine to sit: Mod assist     General bed mobility comments: received pt from OT already sitting up in bed   Transfers Overall transfer level: Needs assistance Equipment used: Rolling walker (2 wheeled) Transfers: Sit to/from UGI CorporationStand;Stand Pivot Transfers Sit to Stand: Mod assist Stand pivot transfers: Mod assist       General transfer comment: needs mod A for balance and managing RW as well as encouragement to continue as she wants to stop and reach for bed   Ambulation/Gait Ambulation/Gait assistance: Mod assist Gait Distance (Feet): 10 Feet(ambulated around bed to chair) Assistive device: Rolling walker (2 wheeled) Gait Pattern/deviations: Step-to pattern;Decreased step length - right;Decreased step length - left;Shuffle;Leaning posteriorly   Gait velocity interpretation: <1.31 ft/sec, indicative of household ambulator General Gait Details: needs mod A for balance and managing RW as well as encouragement to  continue as she wants to stop and reach for bed        Balance Overall balance assessment: Needs assistance Sitting-balance support: Feet supported;Single extremity supported;Bilateral upper extremity supported Sitting balance-[name] Scale: Fair Sitting balance - Comments: able to maintain static sitting with minguard assist     Standing balance-[name] Scale: Poor              Cognition Arousal/Alertness: Awake/alert Behavior During Therapy: WFL for tasks assessed/performed Overall Cognitive Status: History of cognitive impairments - at baseline                                 General Comments: has dementia, can become agitated per chart however no observation of this today; following simple commands today but intermittently requires cues/repetition to do so              Pertinent Vitals/Pain Pain Assessment: No/denies pain           PT Goals (current goals can now be found in the care plan section) Acute Rehab PT Goals Patient Stated Goal: didn't state, agreeable to working with therapy PT Goal Formulation: With family Time For Goal Achievement: 03/24/19 Potential to Achieve Goals: Fair Progress towards PT goals: Progressing toward goals    Frequency    Min 3X/week      PT Plan Other (comment)(TBD but likely SNF)       AM-PAC PT "6 Clicks" Mobility   Outcome Measure  Help needed turning from your back to your side while in a flat bed without using bedrails?: A Lot Help needed  moving from lying on your back to sitting on the side of a flat bed without using bedrails?: A Lot Help needed moving to and from a bed to a chair (including a wheelchair)?: A Lot Help needed standing up from a chair using your arms (e.g., wheelchair or bedside chair)?: A Lot Help needed to walk in hospital room?: A Lot Help needed climbing 3-5 steps with a railing? : Total 6 Click Score: 11    End of Session Equipment Utilized During Treatment: Gait  belt Activity Tolerance: Patient tolerated treatment well Patient left: in chair;with call bell/phone within reach;with chair alarm set Nurse Communication: Mobility status PT Visit Diagnosis: Muscle weakness (generalized) (M62.81);Difficulty in walking, not elsewhere classified (R26.2)     Time: 1145-1200 PT Time Calculation (min) (ACUTE ONLY): 15 min  Charges:  $Therapeutic Activity: 8-22 mins                     Melanie Turner, PT,DPT Acute Rehabilitation Services Office [phone]    Melanie Turner 03/14/2019, 1:32 PM

## 2019-03-14 NOTE — Progress Notes (Signed)
Occupational Therapy Treatment Patient Details Name: Melanie Turner Portal MRN: [id number] DOB: 03/03/1932 Today's Date: 03/14/2019    History of present illness [age] F yo found unresponsive this evening by family member, 2 witnessed seizures by EMS, 1 witnessed clonic tonic seizure in ED. Patient hyperglycemic and hypertensive on presentation.    OT comments  Pt presents supine in bed agreeable to therapy session. Pt requiring maxA for bed mobility, once seated EOB pt able to maintain static balance >5 min with overall close minguard assist. Pt performing grooming ADL with minguard assist, UB dressing with minA during session. Pt overall following simple commands given intermittent cues to do so. Feel pt is appropriate for SNF level services, however if pt's family opting to take pt home recommend 24hr supervision/assist and HHOT services to maximize her strength, safety and independence with ADL and mobility. Will continue to follow.   Follow Up Recommendations  Supervision/Assistance - 24 hour;Home health OT(vs SNF pending available family assist)    Equipment Recommendations  3 in 1 bedside commode          Precautions / Restrictions Precautions Precautions: Fall Restrictions Weight Bearing Restrictions: No       Mobility Bed Mobility Overal bed mobility: Needs Assistance Bed Mobility: Supine to Sit     Supine to sit: Mod assist     General bed mobility comments: pt able to follow commands to initiate bringing LEs towards EOB, requires assist for LEs fully over EOB and to elevate trunk  Transfers                      Balance Overall balance assessment: Needs assistance Sitting-balance support: Feet supported;Single extremity supported;Bilateral upper extremity supported Sitting balance-[name] Scale: Fair Sitting balance - Comments: able to maintain static sitting with minguard assist                                   ADL either performed or assessed  with clinical judgement   ADL Overall ADL's : Needs assistance/impaired     Grooming: Wash/dry face;Set up;Min guard;Sitting Grooming Details (indicate cue type and reason): minguard assist for sitting balance EOB         Upper Body Dressing : Minimal assistance;Sitting Upper Body Dressing Details (indicate cue type and reason): doffing/donning new gown                         Vision       Perception     Praxis      Cognition Arousal/Alertness: Awake/alert Behavior During Therapy: WFL for tasks assessed/performed Overall Cognitive Status: History of cognitive impairments - at baseline                                 General Comments: has dementia, can become agitated per chart however no observation of this today; following simple commands today but intermittently requires cues/repetition to do so         Exercises     Shoulder Instructions       General Comments      Pertinent Vitals/ Pain          Home Living  Prior Functioning/Environment              Frequency  Min 2X/week        Progress Toward Goals  OT Goals(current goals can now be found in the care plan section)  Progress towards OT goals: Progressing toward goals  Acute Rehab OT Goals Patient Stated Goal: didn't state, agreeable to working with therapy OT Goal Formulation: With patient Time For Goal Achievement: 03/25/19 Potential to Achieve Goals: Good  Plan Discharge plan remains appropriate    Co-evaluation                 AM-PAC OT "6 Clicks" Daily Activity     Outcome Measure   Help from another person eating meals?: A Little Help from another person taking care of personal grooming?: A Lot Help from another person toileting, which includes using toliet, bedpan, or urinal?: Total Help from another person bathing (including washing, rinsing, drying)?: A Lot Help from another person to  put on and taking off regular upper body clothing?: A Lot Help from another person to put on and taking off regular lower body clothing?: Total 6 Click Score: 11    End of Session    OT Visit Diagnosis: Unsteadiness on feet (R26.81);Muscle weakness (generalized) (M62.81)   Activity Tolerance Patient tolerated treatment well   Patient Left Other (comment)(hand off to PT to begin session, pt seated EOB)   Nurse Communication Mobility status        Time: 4076-8088 OT Time Calculation (min): 17 min  Charges: OT General Charges $OT Visit: 1 Visit OT Treatments $Self Care/Home Management : 8-22 mins  Marcy Siren, OT Supplemental Rehabilitation Services Pager [phone] Office [phone]   Orlando Penner 03/14/2019, 12:59 PM

## 2019-03-14 NOTE — Discharge Summary (Signed)
Physician Discharge Summary  Melanie Turner NOB:[id number] DOB: [birth year] DOA: 03/08/2019  PCP: Kirk Ruths, MD  Admit date: 03/08/2019 Discharge date: 03/14/2019  Admitted From: Home Disposition: Home  Recommendations for Outpatient Follow-up:  1. Follow up with PCP in 1-2 weeks 2. Please obtain BMP/CBC in one week your next doctors visit.  3. , With arrangements made.  Patient will be going home on metformin 500 mg twice a day and Tradjenta daily.  Supplies have been given.  Extensive diabetic education has been provided. 4. Keppra 5 mg twice daily.  Per neurology recommendations   Discharge Condition: Stable CODE STATUS: DNR Diet recommendation: Diabetic  Brief/Interim Summary:  85 year old with history of essential hypertension, diabetes mellitus type 2 was brought to the hospital for evaluation of unresponsiveness concerning for seizure.  She was found to be status epilepticus.  She was also hyperglycemic without anion gap acidosis.  CT of the head was negative, elevated lactate level.  Neurology recommended starting the patient on 500 mg of Keppra twice daily.  Initially she was on insulin drip and transition to subcutaneous insulin for hyperglycemia.  Diabetic coordinator was consulted.  Her electrolyte abnormalities were fixed. Eventually was decided to send the patient home on metformin 500 mg twice a day and Tradjenta daily.  Case was discussed with diabetic coordinator and appreciate their input.  This was also communicated well with the family members.  Discharge Diagnoses:  Principal Problem:   Seizure ([place]) Active Problems:   DM2 (diabetes mellitus, type 2) (HCC)   Benign essential HTN  Hyperglycemia with uncontrolled diabetes mellitus type 2 -Hemoglobin A1c is 11, she will require insulin at the time of discharge. After extensive discussion with the diabetic coordinator and the family members it was decided to transition patient to metformin 5 mg twice daily and  Tradjenta daily.  Education to family and patient was provided.  Metabolic encephalopathy likely status epilepticus with history of seizure, improved -CT of the head is negative.  Neurology recommended Keppra 500 mg twice daily.  Ativan as needed.   Essential hypertension -Resume home meds  Advanced dementia -Basic precautions.  On Lovenox for DVT prophylaxis while patient was here She is DNR Discharge patient today in stable condition.  Consultations:  Neurology  Diabetic coordinator  Subjective: Patient resting well this morning, no new complaints.   Discharge Exam: Vitals:   03/14/19 0734 03/14/19 1136  BP: 125/64 125/66  Pulse: 67 75  Resp: 16 16  Temp: 98.4 F (36.9 C) 98.9 F (37.2 C)  SpO2: 100% 100%   Vitals:   03/14/19 0029 03/14/19 0351 03/14/19 0734 03/14/19 1136  BP:  112/65 125/64 125/66  Pulse:  64 67 75  Resp:  _0 Temp:  98 F (36.7 C) 98.4 F (36.9 C) 98.9 F (37.2 C)  TempSrc:  Oral Axillary Oral  SpO2:  100% 100% 100%  Weight: 59.6 kg     Height:        General: Pt is alert, awake, not in acute distress, elderly frail-appearing Cardiovascular: RRR, S1/S2 +, no rubs, no gallops Respiratory: CTA bilaterally, no wheezing, no rhonchi Abdominal: Soft, NT, ND, bowel sounds + Extremities: no edema, no cyanosis Alert to her name and place  Discharge Instructions  Discharge Instructions    Diet - low sodium heart healthy   Complete by:  As directed    Increase activity slowly   Complete by:  As directed      Allergies as of 03/14/2019   No  Known Allergies     Medication List    TAKE these medications   amLODipine 5 MG tablet Commonly known as:  NORVASC Take 1 tablet (5 mg total) by mouth daily for 30 days.   blood glucose meter kit and supplies Kit Dispense based on patient and insurance preference. Use up to four times daily as directed. (FOR ICD-9 250.00, 250.01).   donepezil 5 MG tablet Commonly known as:   ARICEPT Take 5 mg by mouth daily.   levETIRAcetam 500 MG tablet Commonly known as:  KEPPRA Take 1 tablet (500 mg total) by mouth 2 (two) times daily for 30 days.   linagliptin 5 MG Tabs tablet Commonly known as:  TRADJENTA Take 1 tablet (5 mg total) by mouth daily for 30 days.   metFORMIN 500 MG tablet Commonly known as:  Glucophage Take 1 tablet (500 mg total) by mouth 2 (two) times daily with a meal for 30 days. What changed:  Another medication with the same name was removed. Continue taking this medication, and follow the directions you see here.   mirtazapine 7.5 MG tablet Commonly known as:  REMERON Take 7.5 mg by mouth daily.   Vitamin C 500 MG Chew Chew 1,000 mg by mouth daily.            Durable Medical Equipment  (From admission, onward)         Start     Ordered   03/14/19 1217  For home use only DME Walker rolling  Once    Question:  Patient needs a walker to treat with the following condition  Answer:  Seizures ([place])   03/14/19 1216   03/14/19 1216  For home use only DME 3 n 1  Once     03/14/19 1216          No Known Allergies  You were cared for by a hospitalist during your hospital stay. If you have any questions about your discharge medications or the care you received while you were in the hospital after you are discharged, you can call the unit and asked to speak with the hospitalist on call if the hospitalist that took care of you is not available. Once you are discharged, your primary care physician will handle any further medical issues. Please note that no refills for any discharge medications will be authorized once you are discharged, as it is imperative that you return to your primary care physician (or establish a relationship with a primary care physician if you do not have one) for your aftercare needs so that they can reassess your need for medications and monitor your lab values.   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 Electronically Signed   By: Logan Bores M.D.   On: 03/09/2019 09:12   Result Date: 03/09/2019 CLINICAL DATA:  Unresponsive.  Seizure. EXAM: CT ANGIOGRAPHY HEAD AND NECK CT PERFUSION BRAIN TECHNIQUE: Multidetector CT imaging of the head and neck was performed using the standard protocol during bolus administration of intravenous contrast. Multiplanar CT image reconstructions and MIPs were obtained to evaluate the vascular anatomy. Carotid stenosis measurements (when applicable) are obtained utilizing NASCET criteria, using the distal internal carotid diameter as the denominator. Multiphase CT imaging of the brain was performed following IV bolus contrast injection. Subsequent parametric perfusion maps were calculated using RAPID software. CONTRAST:  132m OMNIPAQUE  IOHEXOL 350 MG/ML SOLN COMPARISON:  None. FINDINGS: CTA NECK FINDINGS Aortic arch: Incomplete imaging of the aortic arch. Mild arch atherosclerosis. Widely patent arch vessel origins with three-vessel arch configuration. Right carotid system: Patent with mild atherosclerotic plaque at the carotid bifurcation. No evidence of significant stenosis or dissection. Mild beading of the mid cervical ICA. Left carotid system: Patent with minimal plaque at the carotid bifurcation. No evidence significant stenosis or dissection. Minimal irregularity versus mild motion artifact in the mid cervical ICA. Vertebral arteries: Patent without evidence of stenosis or dissection. Mildly dominant right vertebral artery. Skeleton: Minimal cervical spondylosis. Other neck: No mass or enlarged lymph nodes identified. Upper chest: Mild biapical pleuroparenchymal scarring. Review of the MIP images confirms the above findings CTA HEAD FINDINGS Anterior circulation: The internal carotid arteries are widely  patent from skull base to carotid termini. ACAs and MCAs are patent without evidence of proximal branch occlusion or significant proximal stenosis. No aneurysm is identified. Posterior circulation: The intracranial vertebral arteries are widely patent to the basilar. Patent bilateral PICA, left AICA, and bilateral SCA origins are identified. Right AICA is diminutive and not well seen. The basilar artery is widely patent. There is a diminutive right posterior communicating artery. PCAs are patent with branch vessel irregularity but no significant proximal stenosis. No aneurysm is identified. Venous sinuses: Not well evaluated due to arterial phase contrast timing. Anatomic variants: None. Review of the MIP images confirms the above findings CT Brain Perfusion Findings: ASPECTS: 10 CBF (<30%) Volume: 72m Perfusion (Tmax>6.0s) volume: 037mMismatch Volume: 71m12mnfarction Location:None IMPRESSION: 1. Minimal atherosclerosis in the head and neck without large vessel occlusion or significant stenosis. 2. Negative CT perfusion imaging. 3.  Aortic Atherosclerosis (ICD10-I70.0). Preliminary finding of no large vessel occlusion was communicated to Dr. AroRory Percy 8:04 pmon 5/8/2020by text page via the [facility] system. Electronically Signed: By: AllLogan BoresD. 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 Electronically Signed   By: AllLogan BoresD.   On: 03/09/2019 09:12   Result Date: 03/09/2019 CLINICAL DATA:  Unresponsive.  Seizure. EXAM: CT ANGIOGRAPHY HEAD AND NECK CT PERFUSION BRAIN TECHNIQUE: Multidetector CT imaging of the head and neck was performed using the standard protocol during bolus administration of intravenous contrast. Multiplanar CT image reconstructions and MIPs were obtained to evaluate the vascular anatomy. Carotid  stenosis measurements (when applicable) are obtained utilizing NASCET criteria, using the distal internal carotid diameter as the denominator. Multiphase CT imaging of the brain was performed following IV bolus contrast injection. Subsequent parametric perfusion maps were calculated using RAPID software. CONTRAST:  1071m2mNIPAQUE IOHEXOL 350 MG/ML SOLN COMPARISON:  None. FINDINGS: CTA NECK FINDINGS Aortic arch: Incomplete imaging of the aortic arch. Mild arch atherosclerosis. Widely patent arch vessel origins with three-vessel arch configuration. Right carotid system: Patent with mild atherosclerotic plaque at the carotid bifurcation. No evidence of significant stenosis or dissection. Mild beading of the mid cervical ICA. Left carotid system: Patent with minimal plaque at the carotid bifurcation. No evidence significant stenosis or dissection. Minimal irregularity versus mild motion artifact in the mid cervical ICA. Vertebral arteries: Patent without evidence of stenosis or dissection. Mildly dominant right vertebral artery. Skeleton: Minimal cervical spondylosis. Other neck: No mass or enlarged lymph nodes identified. Upper chest: Mild biapical pleuroparenchymal scarring. Review of the MIP images confirms the above findings  CTA HEAD FINDINGS Anterior circulation: The internal carotid arteries are widely patent from skull base to carotid termini. ACAs and MCAs are patent without evidence of proximal branch occlusion or significant proximal stenosis. No aneurysm is identified. Posterior circulation: The intracranial vertebral arteries are widely patent to the basilar. Patent bilateral PICA, left AICA, and bilateral SCA origins are identified. Right AICA is diminutive and not well seen. The basilar artery is widely patent. There is a diminutive right posterior communicating artery. PCAs are patent with branch vessel irregularity but no significant proximal stenosis. No aneurysm is identified. Venous sinuses: Not well  evaluated due to arterial phase contrast timing. Anatomic variants: None. Review of the MIP images confirms the above findings CT Brain Perfusion Findings: ASPECTS: 10 CBF (<30%) Volume: 55m Perfusion (Tmax>6.0s) volume: 074mMismatch Volume: 36m34mnfarction Location:None IMPRESSION: 1. Minimal atherosclerosis in the head and neck without large vessel occlusion or significant stenosis. 2. Negative CT perfusion imaging. 3.  Aortic Atherosclerosis (ICD10-I70.0). Preliminary finding of no large vessel occlusion was communicated to Dr. AroRory Percy 8:04 pmon 5/8/2020by text page via the [facility]aging system. Electronically Signed: By: AllLogan BoresD. On: 03/08/2019 20:23   Mr BraJeri Cos GGntrast  Result Date: 03/09/2019 CLINICAL DATA:  New onset seizures, altered mental status, and hypertensive emergency. Concern for PRES. EXAM: MRI HEAD WITHOUT AND WITH CONTRAST TECHNIQUE: Multiplanar, multiecho pulse sequences of the brain and surrounding structures were obtained without and with intravenous contrast. CONTRAST:  5 mL Gadavist COMPARISON:  Head CT, CTA, and CTP 03/08/2019 FINDINGS: Brain: No acute infarct, mass, midline shift, or extra-axial fluid collection is identified. There are innumerable chronic microhemorrhages throughout the cerebral hemispheres with sparing of the deep gray nuclei. Numerous chronic microhemorrhages are also present in the cerebellum. There is moderate diffuse cerebral volume loss with severe bilateral mesial temporal lobe atrophy. Periventricular white matter T2 hyperintensities are nonspecific but compatible with mild-to-moderate chronic small vessel ischemic disease. No cortical/subcortical T2 signal changes suggestive of posterior reversible encephalopathy syndrome are evident. No abnormal enhancement is identified. Vascular: Major intracranial vascular flow voids are preserved. Skull and upper cervical spine: No suspicious marrow lesion. Sinuses/Orbits: Bilateral cataract extraction.  Clear paranasal sinuses. Trace bilateral mastoid effusions. Other: None. IMPRESSION: 1. No acute intracranial abnormality. 2. Chronic small vessel ischemic disease and cerebral atrophy including severe mesial temporal lobe atrophy. 3. Innumerable chronic cerebral and cerebellar microhemorrhages which may be secondary to chronic hypertension, vasculitis, or cerebral amyloid angiopathy. Electronically Signed   By: AllLogan BoresD.   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 Electronically Signed   By: AllLogan BoresD.   On: 03/09/2019 09:12   Result Date: 03/09/2019 CLINICAL DATA:  Unresponsive.  Seizure. EXAM: CT ANGIOGRAPHY HEAD AND NECK CT PERFUSION BRAIN TECHNIQUE: Multidetector CT imaging of the head and neck was performed using the standard protocol during bolus administration of intravenous contrast. Multiplanar CT image reconstructions and MIPs were obtained to evaluate the vascular anatomy. Carotid stenosis measurements (when applicable) are obtained utilizing NASCET criteria, using the distal internal carotid diameter as the denominator. Multiphase CT imaging of the brain was performed following IV bolus contrast injection. Subsequent parametric perfusion maps were calculated using RAPID software. CONTRAST:  1036m77mNIPAQUE IOHEXOL 350 MG/ML SOLN COMPARISON:  None. FINDINGS: CTA NECK FINDINGS Aortic arch: Incomplete imaging of the aortic arch. Mild arch  atherosclerosis. Widely patent arch vessel origins with three-vessel arch configuration. Right carotid system: Patent with mild atherosclerotic plaque at the carotid bifurcation. No evidence of significant stenosis or dissection. Mild beading of the mid cervical ICA. Left carotid system: Patent with minimal plaque at the carotid bifurcation. No evidence significant stenosis or  dissection. Minimal irregularity versus mild motion artifact in the mid cervical ICA. Vertebral arteries: Patent without evidence of stenosis or dissection. Mildly dominant right vertebral artery. Skeleton: Minimal cervical spondylosis. Other neck: No mass or enlarged lymph nodes identified. Upper chest: Mild biapical pleuroparenchymal scarring. Review of the MIP images confirms the above findings CTA HEAD FINDINGS Anterior circulation: The internal carotid arteries are widely patent from skull base to carotid termini. ACAs and MCAs are patent without evidence of proximal branch occlusion or significant proximal stenosis. No aneurysm is identified. Posterior circulation: The intracranial vertebral arteries are widely patent to the basilar. Patent bilateral PICA, left AICA, and bilateral SCA origins are identified. Right AICA is diminutive and not well seen. The basilar artery is widely patent. There is a diminutive right posterior communicating artery. PCAs are patent with branch vessel irregularity but no significant proximal stenosis. No aneurysm is identified. Venous sinuses: Not well evaluated due to arterial phase contrast timing. Anatomic variants: None. Review of the MIP images confirms the above findings CT Brain Perfusion Findings: ASPECTS: 10 CBF (<30%) Volume: 2m Perfusion (Tmax>6.0s) volume: 071mMismatch Volume: 62m54mnfarction Location:None IMPRESSION: 1. Minimal atherosclerosis in the head and neck without large vessel occlusion or significant stenosis. 2. Negative CT perfusion imaging. 3.  Aortic Atherosclerosis (ICD10-I70.0). Preliminary finding of no large vessel occlusion was communicated to Dr. AroRory Percy 8:04 pmon 5/8/2020by text page via the [facility]aging system. Electronically Signed: By: AllLogan BoresD. On: 03/08/2019 20:23   Dg Chest Port 1 View  Result Date: 03/10/2019 CLINICAL DATA:  Abnormal respiration. EXAM: PORTABLE CHEST 1 VIEW COMPARISON:  03/08/2019 FINDINGS: The patient is  mildly rotated to the right. The cardiomediastinal silhouette is unchanged with normal heart size and aortic atherosclerosis noted. Chronic interstitial changes are most notable in the apices and similar to previous studies. No acute airspace consolidation, edema, pleural effusion, pneumothorax is identified. No acute osseous abnormality is seen. IMPRESSION: No evidence of acute cardiopulmonary process. Electronically Signed   By: AllLogan BoresD.   On: 03/10/2019 07:59   Dg Chest Port 1 View  Result Date: 03/08/2019 CLINICAL DATA:  Recent seizure activity and altered mental status EXAM: PORTABLE CHEST 1 VIEW COMPARISON:  09/05/2018 FINDINGS: Cardiac shadows within normal limits. Aortic calcifications are again noted. The lungs are well aerated bilaterally without focal infiltrate. No acute bony abnormality is seen. IMPRESSION: No active disease. Electronically Signed   By: MarInez CatalinaD.   On: 03/08/2019 20:59   Ct Head Code Stroke Wo Contrast  Result Date: 03/08/2019 CLINICAL DATA:  Code stroke. Unresponsive. Seizures. Right-sided gaze. EXAM: CT HEAD WITHOUT CONTRAST TECHNIQUE: Contiguous axial images were obtained from the base of the skull through the vertex without intravenous contrast. COMPARISON:  09/05/2018 FINDINGS: Brain: There is no evidence of acute infarct, intracranial hemorrhage, mass, midline shift, or extra-axial fluid collection. Basal ganglia calcifications are unchanged. Cerebral atrophy is unchanged with severe mesial temporal lobe atrophy again noted bilaterally. Patchy cerebral white matter hypodensities are similar to the prior study and nonspecific but compatible with moderate chronic small vessel ischemic disease. Vascular: Calcified atherosclerosis at the skull base. No hyperdense vessel. Skull: No fracture or focal osseous lesion. Sinuses/Orbits:  Paranasal sinuses and mastoid air cells are clear. Bilateral cataract extraction. Other: None. ASPECTS [facility] Stroke Program Early  CT Score) - Ganglionic level infarction (caudate, lentiform nuclei, internal capsule, insula, M1-M3 cortex): 7 - Supraganglionic infarction (M4-M6 cortex): 3 Total score (0-10 with 10 being normal): 10 IMPRESSION: 1. No evidence of acute intracranial abnormality. 2. ASPECTS is 10. 3. Moderate chronic small vessel ischemic disease. Severe mesial temporal lobe atrophy. These results were communicated to Dr. Rory Percy at 8:04 pm on 03/08/2019 by text page via the [facility] messaging system. Electronically Signed   By: Logan Bores M.D.   On: 03/08/2019 20:06     The results of significant diagnostics from this hospitalization (including imaging, microbiology, ancillary and laboratory) are listed below for reference.     Microbiology: Recent Results (from the past 240 hour(s))  SARS Coronavirus 2 (CEPHEID - Performed in [place] hospital lab), Hosp Order     Status: None   Collection Time: 03/08/19  8:37 PM  Result Value Ref Range Status   SARS Coronavirus 2 NEGATIVE NEGATIVE Final    Comment: (NOTE) If result is NEGATIVE SARS-CoV-2 target nucleic acids are NOT DETECTED. The SARS-CoV-2 RNA is generally detectable in upper and lower  respiratory specimens during the acute phase of infection. The lowest  concentration of SARS-CoV-2 viral copies this assay can detect is 250  copies / mL. A negative result does not preclude SARS-CoV-2 infection  and should not be used as the sole basis for treatment or other  patient management decisions.  A negative result may occur with  improper specimen collection / handling, submission of specimen other  than nasopharyngeal swab, presence of viral mutation(s) within the  areas targeted by this assay, and inadequate number of viral copies  (<250 copies / mL). A negative result must be combined with clinical  observations, patient history, and epidemiological information. If result is POSITIVE SARS-CoV-2 target nucleic acids are DETECTED. The SARS-CoV-2 RNA is  generally detectable in upper and lower  respiratory specimens dur ing the acute phase of infection.  Positive  results are indicative of active infection with SARS-CoV-2.  Clinical  correlation with patient history and other diagnostic information is  necessary to determine patient infection status.  Positive results do  not rule out bacterial infection or co-infection with other viruses. If result is PRESUMPTIVE POSTIVE SARS-CoV-2 nucleic acids MAY BE PRESENT.   A presumptive positive result was obtained on the submitted specimen  and confirmed on repeat testing.  While 2019 novel coronavirus  (SARS-CoV-2) nucleic acids may be present in the submitted sample  additional confirmatory testing may be necessary for epidemiological  and / or clinical management purposes  to differentiate between  SARS-CoV-2 and other Sarbecovirus currently known to infect humans.  If clinically indicated additional testing with an alternate test  methodology [phone]) is advised. The SARS-CoV-2 RNA is generally  detectable in upper and lower respiratory sp ecimens during the acute  phase of infection. The expected result is Negative. Fact Sheet for Patients:  StrictlyIdeas.no Fact Sheet for Healthcare Providers: BankingDealers.co.za This test is not yet approved or cleared by the [place] FDA and has been authorized for detection and/or diagnosis of SARS-CoV-2 by FDA under an Emergency Use Authorization (EUA).  This EUA will remain in effect (meaning this test can be used) for the duration of the COVID-19 declaration under Section 564(b)(1) of the Act, 21 U.S.C. section 360bbb-3(b)(1), unless the authorization is terminated or revoked sooner. Performed at [facility],  [address], [place], [place] [address]   MRSA PCR Screening     Status: None   Collection Time: 03/09/19  1:03 AM  Result Value Ref Range Status   MRSA by PCR NEGATIVE  NEGATIVE Final    Comment:        The GeneXpert MRSA Assay (FDA approved for NASAL specimens only), is one component of a comprehensive MRSA colonization surveillance program. It is not intended to diagnose MRSA infection nor to guide or monitor treatment for MRSA infections. Performed at [facility], [place] [address] [place], [place] [address]      Labs: BNP (last 3 results) Recent Labs    03/09/19 0123  BNP 29.5   Basic Metabolic Panel: Recent Labs  Lab 03/08/19 2340 03/09/19 0435 03/10/19 0514 03/11/19 0527 03/12/19 0501 03/13/19 0342 03/14/19 0334  NA 137 137 135 135 135 134* 135  K 2.9* 4.0 3.1* 3.3* 3.7 3.8 3.7  CL 93* 95* 98 96* 100 100 99  CO2 18* _0 GLUCOSE 501* 207* 152* 147* 215* 282* 164*  BUN _1 CREATININE 0.96 0.70 0.74 0.62 0.66 0.99 0.72  CALCIUM 9.8 9.4 9.1 8.7* 8.8* 8.9 9.0  MG 2.1 1.6* 2.1 1.8  --   --   --   PHOS 4.0 2.7 3.1  --   --   --   --    Liver Function Tests: Recent Labs  Lab 03/08/19 1945 03/10/19 0514  AST 31 27  ALT 22 19  ALKPHOS 60 50  BILITOT 0.7 0.7  PROT 7.7 6.2*  ALBUMIN 4.0 3.1*   No results for input(s): LIPASE, AMYLASE in the last 168 hours. No results for input(s): AMMONIA in the last 168 hours. CBC: Recent Labs  Lab 03/08/19 1945  03/10/19 0514 03/11/19 0527 03/12/19 0501 03/13/19 0342 03/14/19 0334  WBC 8.2   < > 10.0 8.3 7.6 6.5 5.7  NEUTROABS 4.6  --  6.8  --   --   --   --   HGB 13.2   < > 11.5* 11.8* 13.4 11.7* 11.9*  HCT 41.2   < > 34.3* 35.3* 39.9 35.6* 36.2  MCV 88.6   < > 84.5 85.7 86.0 86.0 86.0  PLT 267   < > 242 255 237 259 301   < > = values in this interval not displayed.   Cardiac Enzymes: Recent Labs  Lab 03/10/19 0514  TROPONINI <0.03   BNP: Invalid input(s): POCBNP CBG: Recent Labs  Lab 03/14/19 0015 03/14/19 0402 03/14/19 0729 03/14/19 0831 03/14/19 1134  GLUCAP 154* 140* 53* 148* 226*   D-Dimer No results for input(s):  DDIMER in the last 72 hours. Hgb A1c No results for input(s): HGBA1C in the last 72 hours. Lipid Profile No results for input(s): CHOL, HDL, LDLCALC, TRIG, CHOLHDL, LDLDIRECT in the last 72 hours. Thyroid function studies No results for input(s): TSH, T4TOTAL, T3FREE, THYROIDAB in the last 72 hours.  Invalid input(s): FREET3 Anemia work up No results for input(s): VITAMINB12, FOLATE, FERRITIN, TIBC, IRON, RETICCTPCT in the last 72 hours. Urinalysis    Component Value Date/Time   COLORURINE COLORLESS (A) 03/08/2019 2039   APPEARANCEUR CLEAR 03/08/2019 2039   APPEARANCEUR Clear 06/30/2014 1501   LABSPEC 1.021 03/08/2019 2039   LABSPEC 1.011 06/30/2014 1501   PHURINE 7.0 03/08/2019 2039   GLUCOSEU >=500 (A) 03/08/2019 2039   GLUCOSEU Negative 06/30/2014 1501   HGBUR NEGATIVE  03/08/2019 2039   BILIRUBINUR NEGATIVE 03/08/2019 2039   BILIRUBINUR Negative 06/30/2014 1501   KETONESUR NEGATIVE 03/08/2019 2039   PROTEINUR NEGATIVE 03/08/2019 2039   NITRITE NEGATIVE 03/08/2019 2039   LEUKOCYTESUR NEGATIVE 03/08/2019 2039   LEUKOCYTESUR Trace 06/30/2014 1501   Sepsis Labs Invalid input(s): PROCALCITONIN,  WBC,  LACTICIDVEN Microbiology Recent Results (from the past 240 hour(s))  SARS Coronavirus 2 (CEPHEID - Performed in [place] hospital lab), Hosp Order     Status: None   Collection Time: 03/08/19  8:37 PM  Result Value Ref Range Status   SARS Coronavirus 2 NEGATIVE NEGATIVE Final    Comment: (NOTE) If result is NEGATIVE SARS-CoV-2 target nucleic acids are NOT DETECTED. The SARS-CoV-2 RNA is generally detectable in upper and lower  respiratory specimens during the acute phase of infection. The lowest  concentration of SARS-CoV-2 viral copies this assay can detect is 250  copies / mL. A negative result does not preclude SARS-CoV-2 infection  and should not be used as the sole basis for treatment or other  patient management decisions.  A negative result may occur with   improper specimen collection / handling, submission of specimen other  than nasopharyngeal swab, presence of viral mutation(s) within the  areas targeted by this assay, and inadequate number of viral copies  (<250 copies / mL). A negative result must be combined with clinical  observations, patient history, and epidemiological information. If result is POSITIVE SARS-CoV-2 target nucleic acids are DETECTED. The SARS-CoV-2 RNA is generally detectable in upper and lower  respiratory specimens dur ing the acute phase of infection.  Positive  results are indicative of active infection with SARS-CoV-2.  Clinical  correlation with patient history and other diagnostic information is  necessary to determine patient infection status.  Positive results do  not rule out bacterial infection or co-infection with other viruses. If result is PRESUMPTIVE POSTIVE SARS-CoV-2 nucleic acids MAY BE PRESENT.   A presumptive positive result was obtained on the submitted specimen  and confirmed on repeat testing.  While 2019 novel coronavirus  (SARS-CoV-2) nucleic acids may be present in the submitted sample  additional confirmatory testing may be necessary for epidemiological  and / or clinical management purposes  to differentiate between  SARS-CoV-2 and other Sarbecovirus currently known to infect humans.  If clinically indicated additional testing with an alternate test  methodology [phone]) is advised. The SARS-CoV-2 RNA is generally  detectable in upper and lower respiratory sp ecimens during the acute  phase of infection. The expected result is Negative. Fact Sheet for Patients:  StrictlyIdeas.no Fact Sheet for Healthcare Providers: BankingDealers.co.za This test is not yet approved or cleared by the [place] FDA and has been authorized for detection and/or diagnosis of SARS-CoV-2 by FDA under an Emergency Use Authorization (EUA).  This EUA will  remain in effect (meaning this test can be used) for the duration of the COVID-19 declaration under Section 564(b)(1) of the Act, 21 U.S.C. section 360bbb-3(b)(1), unless the authorization is terminated or revoked sooner. Performed at [facility], [place] [address] [place], [place] [address]   MRSA PCR Screening     Status: None   Collection Time: 03/09/19  1:03 AM  Result Value Ref Range Status   MRSA by PCR NEGATIVE NEGATIVE Final    Comment:        The GeneXpert MRSA Assay (FDA approved for NASAL specimens only), is one component of a comprehensive MRSA colonization surveillance program. It is not intended to diagnose  MRSA infection nor to guide or monitor treatment for MRSA infections. Performed at [facility], [place] [address], [place], [place] [address]      Time coordinating discharge:  I have spent 35 minutes face to face with the patient and on the ward discussing the patients care, assessment, plan and disposition with other care givers. >50% of the time was devoted counseling the patient about the risks and benefits of treatment/Discharge disposition and coordinating care.   SIGNED:   Damita Lack, MD  [facility] 03/14/2019, 1:40 PM   If 7PM-7AM, please contact night-coverage [email or web address]

## 2019-03-14 NOTE — Progress Notes (Signed)
Inpatient Diabetes Program Recommendations  AACE/ADA: New Consensus Statement on Inpatient Glycemic Control   Target Ranges:  Prepandial:   less than 140 mg/dL      Peak postprandial:   less than 180 mg/dL (1-2 hours)      Critically ill patients:  140 - 180 mg/dL  Results for Melanie Turner, TSE (MRN [id number]) as of 03/14/2019 08:04  Ref. Range 03/13/2019 07:29 03/13/2019 11:58 03/13/2019 16:12 03/13/2019 21:28 03/13/2019 21:39 03/13/2019 22:39 03/14/2019 00:15 03/14/2019 04:02 03/14/2019 07:29  Glucose-Capillary Latest Ref Range: 70 - 99 mg/dL 135 (H)  Novolog 2 units 270 (H)  Novolog 8 units 328 (H)  Novolog 11 units  Levemir 6 units @ 17:22 48 (L) 67 (L) 148 (H) 154 (H)  Novolog 3 units 140 (H)  Novolog 2 units 53 (L)   Results for Melanie Turner, [facility]AL[facility]AD (MRN [id number]) as of 03/14/2019 08:52  Ref. Range 03/08/2019 23:28  Hemoglobin A1C Latest Ref Range: 4.8 - 5.6 % 11.4 (H)   Review of Glycemic Control  Diabetes history: DM2 Outpatient Diabetes medications: Metformin XR 500 mg QAM Current orders for Inpatient glycemic control: Levemir 6 units daily, Novolog 0-15 units Q4H  Inpatient Diabetes Program Recommendations:    NOTE: Levemir 6 units daily was started on 03/13/19. Noted glucose down to 48 mg/dl at 21:28 on 03/13/19 and 53 mg/dl today at 7:29 am. Anticipate both incidents of hypoglycemia from Novolog dosages received and Novolog moderate correction Q4H. Spoke with Dr. Reesa Chew this morning regarding discharge plan for DM control. Due to dementia, glycemic trends over the past 24 hours, and noted hypoglycemia, recommend patient be discharged on Metformin XR 500 mg BID and Tradjenta 5 mg daily. Those oral DM medications have less risk of hypoglycemia and Tradjenta is glucose dependent so would work when needed. Dr. Reesa Chew in agreement with discharging patient on Metformin and Tradjenta, Will call patient's daughter to discuss and see what supplies are needed for DM management at home.  Called  patient's daughter Joaquim Nam) and talked with her about DM and discharge plan. Ms. Javier Glazier notes that she has DM herself so she is knowledgeable about DM and familiar with how to monitor glucose. She states that her mother use to have glucose monitoring supplies but her mother has misplaced everything and she will need a prescription for a new glucometer and testing supplies. Discussed A1C (11.4% on 03/08/19) and glucose goals. Given patient's age and dementia, would allow higher glucose target of around 150 mg/dl. Discussed hypoglycemia and explained that due to age and dementia, would try to prevent hypoglycemia from occurring. Discussed discharge plan for DM as patient will be discharged on Metformin and Tradjenta. Discussed Metformin and Tradjenta in more detail and explained how each one works for DM control. Asked that patient's glucose be checked at least 2 times per day and if glucose is consistently greater than 180 mg/dl, she should reach out to patient's PCP for recommendations to improve glycemic control. Ms. Javier Glazier verbalized understanding of information discussed and notes that she has no questions at this time.  At time of discharge please provide Rx for: glucose monitoring kit (#[id number]) which will include glucometer, test strips, and lancets  Thanks, Barnie Alderman, RN, MSN, CDE Diabetes Coordinator Inpatient Diabetes Program [phone] (Team Pager from 8am to 5pm)

## 2019-03-14 NOTE — Care Management Important Message (Signed)
Important Message  Patient Details  Name: Melanie Turner MRN: [id number] Date of Birth: Apr 19, 1932   Medicare Important Message Given:  Yes    Hever Castilleja Stefan Church 03/14/2019, 2:37 PM

## 2019-03-14 NOTE — TOC Transition Note (Signed)
Transition of Care [facility]) - CM/SW Discharge Note   Patient Details  Name: Melanie Turner MRN: [id number] Date of Birth: 1932-07-28  Transition of Care [facility] CM/SW Contact:  Kermit Balo, RN Phone Number: 03/14/2019, 3:33 PM   Clinical Narrative:    Pt discharging home with family. [facility] aware of d/c.  DME for home is in the room. TOC pharmacy to deliver medications to the daughter when she arrives.  Daughter providing transportation home.   Final next level of care: Home w Home Health Services Barriers to Discharge: Barriers Resolved   Patient Goals and CMS Choice   CMS Medicare.gov Compare Post Acute Care list provided to:: Patient Represenative (must comment)(daughter) Choice offered to / list presented to : Adult Children  Discharge Placement                       Discharge Plan and Services   Discharge Planning Services: CM Consult Post Acute Care Choice: Home Health                    HH Arranged: RN, PT, OT, Social Work, Facilities manager Therapy HH Agency: [facility] Date HH Agency Contacted: 03/13/19 Time HH Agency Contacted: 1630 Representative spoke with at [facility] Agency: Lupita Leash  Social Determinants of Health (SDOH) Interventions     Readmission Risk Interventions No flowsheet data found.

## 2019-03-14 NOTE — Progress Notes (Signed)
Hypoglycemic Event  CBG: 53  Treatment: 8 oz juice/soda Also eating breakfast Symptoms: None  Follow-up CBG: Time:0805 CBG Result:148  Possible Reasons for Event: Unknown  Comments/MD notified: Yes    Dyke Brackett

## 2019-03-14 NOTE — Progress Notes (Signed)
Patient being discharged home with home health. IV removed. CCMD notified. Education and instructions given to patient's daughter. Patient leaving unit via wheelchair.

## 2019-03-15 DIAGNOSIS — E1165 Type 2 diabetes mellitus with hyperglycemia: Secondary | ICD-10-CM | POA: Diagnosis not present

## 2019-03-15 DIAGNOSIS — F0390 Unspecified dementia without behavioral disturbance: Secondary | ICD-10-CM | POA: Diagnosis not present

## 2019-03-15 DIAGNOSIS — I161 Hypertensive emergency: Secondary | ICD-10-CM | POA: Diagnosis not present

## 2019-03-15 DIAGNOSIS — Z7984 Long term (current) use of oral hypoglycemic drugs: Secondary | ICD-10-CM | POA: Diagnosis not present

## 2019-03-15 DIAGNOSIS — G40411 Other generalized epilepsy and epileptic syndromes, intractable, with status epilepticus: Secondary | ICD-10-CM | POA: Diagnosis not present

## 2019-03-19 DIAGNOSIS — F0390 Unspecified dementia without behavioral disturbance: Secondary | ICD-10-CM | POA: Diagnosis not present

## 2019-03-19 DIAGNOSIS — Z7984 Long term (current) use of oral hypoglycemic drugs: Secondary | ICD-10-CM | POA: Diagnosis not present

## 2019-03-19 DIAGNOSIS — E1165 Type 2 diabetes mellitus with hyperglycemia: Secondary | ICD-10-CM | POA: Diagnosis not present

## 2019-03-19 DIAGNOSIS — G40411 Other generalized epilepsy and epileptic syndromes, intractable, with status epilepticus: Secondary | ICD-10-CM | POA: Diagnosis not present

## 2019-03-19 DIAGNOSIS — I161 Hypertensive emergency: Secondary | ICD-10-CM | POA: Diagnosis not present

## 2019-03-20 DIAGNOSIS — I161 Hypertensive emergency: Secondary | ICD-10-CM | POA: Diagnosis not present

## 2019-03-20 DIAGNOSIS — E1165 Type 2 diabetes mellitus with hyperglycemia: Secondary | ICD-10-CM | POA: Diagnosis not present

## 2019-03-20 DIAGNOSIS — Z7984 Long term (current) use of oral hypoglycemic drugs: Secondary | ICD-10-CM | POA: Diagnosis not present

## 2019-03-20 DIAGNOSIS — F0390 Unspecified dementia without behavioral disturbance: Secondary | ICD-10-CM | POA: Diagnosis not present

## 2019-03-20 DIAGNOSIS — G40411 Other generalized epilepsy and epileptic syndromes, intractable, with status epilepticus: Secondary | ICD-10-CM | POA: Diagnosis not present

## 2019-03-22 DIAGNOSIS — G40411 Other generalized epilepsy and epileptic syndromes, intractable, with status epilepticus: Secondary | ICD-10-CM | POA: Diagnosis not present

## 2019-03-22 DIAGNOSIS — I161 Hypertensive emergency: Secondary | ICD-10-CM | POA: Diagnosis not present

## 2019-03-22 DIAGNOSIS — F0390 Unspecified dementia without behavioral disturbance: Secondary | ICD-10-CM | POA: Diagnosis not present

## 2019-03-22 DIAGNOSIS — E1165 Type 2 diabetes mellitus with hyperglycemia: Secondary | ICD-10-CM | POA: Diagnosis not present

## 2019-03-22 DIAGNOSIS — Z7984 Long term (current) use of oral hypoglycemic drugs: Secondary | ICD-10-CM | POA: Diagnosis not present

## 2019-03-26 DIAGNOSIS — I10 Essential (primary) hypertension: Secondary | ICD-10-CM | POA: Diagnosis not present

## 2019-03-26 DIAGNOSIS — E7800 Pure hypercholesterolemia, unspecified: Secondary | ICD-10-CM | POA: Diagnosis not present

## 2019-03-26 DIAGNOSIS — F0390 Unspecified dementia without behavioral disturbance: Secondary | ICD-10-CM | POA: Diagnosis not present

## 2019-03-26 DIAGNOSIS — E441 Mild protein-calorie malnutrition: Secondary | ICD-10-CM | POA: Diagnosis not present

## 2019-03-26 DIAGNOSIS — N182 Chronic kidney disease, stage 2 (mild): Secondary | ICD-10-CM | POA: Diagnosis not present

## 2019-03-26 DIAGNOSIS — Z993 Dependence on wheelchair: Secondary | ICD-10-CM | POA: Diagnosis not present

## 2019-03-26 DIAGNOSIS — E1122 Type 2 diabetes mellitus with diabetic chronic kidney disease: Secondary | ICD-10-CM | POA: Diagnosis not present

## 2019-04-01 DIAGNOSIS — Z7984 Long term (current) use of oral hypoglycemic drugs: Secondary | ICD-10-CM | POA: Diagnosis not present

## 2019-04-01 DIAGNOSIS — G40411 Other generalized epilepsy and epileptic syndromes, intractable, with status epilepticus: Secondary | ICD-10-CM | POA: Diagnosis not present

## 2019-04-01 DIAGNOSIS — F0390 Unspecified dementia without behavioral disturbance: Secondary | ICD-10-CM | POA: Diagnosis not present

## 2019-04-01 DIAGNOSIS — I161 Hypertensive emergency: Secondary | ICD-10-CM | POA: Diagnosis not present

## 2019-04-01 DIAGNOSIS — E1165 Type 2 diabetes mellitus with hyperglycemia: Secondary | ICD-10-CM | POA: Diagnosis not present

## 2019-04-02 DIAGNOSIS — F0390 Unspecified dementia without behavioral disturbance: Secondary | ICD-10-CM | POA: Diagnosis not present

## 2019-04-02 DIAGNOSIS — I161 Hypertensive emergency: Secondary | ICD-10-CM | POA: Diagnosis not present

## 2019-04-02 DIAGNOSIS — G40411 Other generalized epilepsy and epileptic syndromes, intractable, with status epilepticus: Secondary | ICD-10-CM | POA: Diagnosis not present

## 2019-04-02 DIAGNOSIS — E1165 Type 2 diabetes mellitus with hyperglycemia: Secondary | ICD-10-CM | POA: Diagnosis not present

## 2019-04-02 DIAGNOSIS — Z7984 Long term (current) use of oral hypoglycemic drugs: Secondary | ICD-10-CM | POA: Diagnosis not present

## 2019-04-04 DIAGNOSIS — E1165 Type 2 diabetes mellitus with hyperglycemia: Secondary | ICD-10-CM | POA: Diagnosis not present

## 2019-04-04 DIAGNOSIS — Z7984 Long term (current) use of oral hypoglycemic drugs: Secondary | ICD-10-CM | POA: Diagnosis not present

## 2019-04-04 DIAGNOSIS — F0390 Unspecified dementia without behavioral disturbance: Secondary | ICD-10-CM | POA: Diagnosis not present

## 2019-04-04 DIAGNOSIS — I161 Hypertensive emergency: Secondary | ICD-10-CM | POA: Diagnosis not present

## 2019-04-04 DIAGNOSIS — G40411 Other generalized epilepsy and epileptic syndromes, intractable, with status epilepticus: Secondary | ICD-10-CM | POA: Diagnosis not present

## 2019-04-08 DIAGNOSIS — Z7984 Long term (current) use of oral hypoglycemic drugs: Secondary | ICD-10-CM | POA: Diagnosis not present

## 2019-04-08 DIAGNOSIS — G40411 Other generalized epilepsy and epileptic syndromes, intractable, with status epilepticus: Secondary | ICD-10-CM | POA: Diagnosis not present

## 2019-04-08 DIAGNOSIS — F0390 Unspecified dementia without behavioral disturbance: Secondary | ICD-10-CM | POA: Diagnosis not present

## 2019-04-08 DIAGNOSIS — E1165 Type 2 diabetes mellitus with hyperglycemia: Secondary | ICD-10-CM | POA: Diagnosis not present

## 2019-04-08 DIAGNOSIS — I161 Hypertensive emergency: Secondary | ICD-10-CM | POA: Diagnosis not present

## 2019-04-10 DIAGNOSIS — I161 Hypertensive emergency: Secondary | ICD-10-CM | POA: Diagnosis not present

## 2019-04-10 DIAGNOSIS — G40411 Other generalized epilepsy and epileptic syndromes, intractable, with status epilepticus: Secondary | ICD-10-CM | POA: Diagnosis not present

## 2019-04-10 DIAGNOSIS — Z7984 Long term (current) use of oral hypoglycemic drugs: Secondary | ICD-10-CM | POA: Diagnosis not present

## 2019-04-10 DIAGNOSIS — F0390 Unspecified dementia without behavioral disturbance: Secondary | ICD-10-CM | POA: Diagnosis not present

## 2019-04-10 DIAGNOSIS — E1165 Type 2 diabetes mellitus with hyperglycemia: Secondary | ICD-10-CM | POA: Diagnosis not present

## 2019-04-12 DIAGNOSIS — E1165 Type 2 diabetes mellitus with hyperglycemia: Secondary | ICD-10-CM | POA: Diagnosis not present

## 2019-04-12 DIAGNOSIS — Z7984 Long term (current) use of oral hypoglycemic drugs: Secondary | ICD-10-CM | POA: Diagnosis not present

## 2019-04-12 DIAGNOSIS — I161 Hypertensive emergency: Secondary | ICD-10-CM | POA: Diagnosis not present

## 2019-04-12 DIAGNOSIS — G40411 Other generalized epilepsy and epileptic syndromes, intractable, with status epilepticus: Secondary | ICD-10-CM | POA: Diagnosis not present

## 2019-04-12 DIAGNOSIS — F0390 Unspecified dementia without behavioral disturbance: Secondary | ICD-10-CM | POA: Diagnosis not present

## 2019-04-16 DIAGNOSIS — G40411 Other generalized epilepsy and epileptic syndromes, intractable, with status epilepticus: Secondary | ICD-10-CM | POA: Diagnosis not present

## 2019-04-16 DIAGNOSIS — Z7984 Long term (current) use of oral hypoglycemic drugs: Secondary | ICD-10-CM | POA: Diagnosis not present

## 2019-04-16 DIAGNOSIS — F0390 Unspecified dementia without behavioral disturbance: Secondary | ICD-10-CM | POA: Diagnosis not present

## 2019-04-16 DIAGNOSIS — I161 Hypertensive emergency: Secondary | ICD-10-CM | POA: Diagnosis not present

## 2019-04-16 DIAGNOSIS — E1165 Type 2 diabetes mellitus with hyperglycemia: Secondary | ICD-10-CM | POA: Diagnosis not present

## 2019-04-26 DIAGNOSIS — G40411 Other generalized epilepsy and epileptic syndromes, intractable, with status epilepticus: Secondary | ICD-10-CM | POA: Diagnosis not present

## 2019-04-26 DIAGNOSIS — I161 Hypertensive emergency: Secondary | ICD-10-CM | POA: Diagnosis not present

## 2019-04-26 DIAGNOSIS — E1165 Type 2 diabetes mellitus with hyperglycemia: Secondary | ICD-10-CM | POA: Diagnosis not present

## 2019-04-26 DIAGNOSIS — F0390 Unspecified dementia without behavioral disturbance: Secondary | ICD-10-CM | POA: Diagnosis not present

## 2019-04-26 DIAGNOSIS — Z7984 Long term (current) use of oral hypoglycemic drugs: Secondary | ICD-10-CM | POA: Diagnosis not present

## 2019-04-29 DIAGNOSIS — F0390 Unspecified dementia without behavioral disturbance: Secondary | ICD-10-CM | POA: Diagnosis not present

## 2019-04-29 DIAGNOSIS — G40411 Other generalized epilepsy and epileptic syndromes, intractable, with status epilepticus: Secondary | ICD-10-CM | POA: Diagnosis not present

## 2019-04-29 DIAGNOSIS — I161 Hypertensive emergency: Secondary | ICD-10-CM | POA: Diagnosis not present

## 2019-04-29 DIAGNOSIS — Z7984 Long term (current) use of oral hypoglycemic drugs: Secondary | ICD-10-CM | POA: Diagnosis not present

## 2019-04-29 DIAGNOSIS — E1165 Type 2 diabetes mellitus with hyperglycemia: Secondary | ICD-10-CM | POA: Diagnosis not present

## 2019-05-07 DIAGNOSIS — G40411 Other generalized epilepsy and epileptic syndromes, intractable, with status epilepticus: Secondary | ICD-10-CM | POA: Diagnosis not present

## 2019-05-07 DIAGNOSIS — F0390 Unspecified dementia without behavioral disturbance: Secondary | ICD-10-CM | POA: Diagnosis not present

## 2019-05-07 DIAGNOSIS — I161 Hypertensive emergency: Secondary | ICD-10-CM | POA: Diagnosis not present

## 2019-05-07 DIAGNOSIS — E1165 Type 2 diabetes mellitus with hyperglycemia: Secondary | ICD-10-CM | POA: Diagnosis not present

## 2019-05-07 DIAGNOSIS — Z7984 Long term (current) use of oral hypoglycemic drugs: Secondary | ICD-10-CM | POA: Diagnosis not present

## 2019-05-09 DIAGNOSIS — E1165 Type 2 diabetes mellitus with hyperglycemia: Secondary | ICD-10-CM | POA: Diagnosis not present

## 2019-05-09 DIAGNOSIS — I161 Hypertensive emergency: Secondary | ICD-10-CM | POA: Diagnosis not present

## 2019-05-09 DIAGNOSIS — G40411 Other generalized epilepsy and epileptic syndromes, intractable, with status epilepticus: Secondary | ICD-10-CM | POA: Diagnosis not present

## 2019-05-09 DIAGNOSIS — F0390 Unspecified dementia without behavioral disturbance: Secondary | ICD-10-CM | POA: Diagnosis not present

## 2019-05-09 DIAGNOSIS — Z7984 Long term (current) use of oral hypoglycemic drugs: Secondary | ICD-10-CM | POA: Diagnosis not present

## 2019-07-30 DIAGNOSIS — N182 Chronic kidney disease, stage 2 (mild): Secondary | ICD-10-CM | POA: Diagnosis not present

## 2019-07-30 DIAGNOSIS — F0390 Unspecified dementia without behavioral disturbance: Secondary | ICD-10-CM | POA: Diagnosis not present

## 2019-07-30 DIAGNOSIS — I129 Hypertensive chronic kidney disease with stage 1 through stage 4 chronic kidney disease, or unspecified chronic kidney disease: Secondary | ICD-10-CM | POA: Diagnosis not present

## 2019-07-30 DIAGNOSIS — Z993 Dependence on wheelchair: Secondary | ICD-10-CM | POA: Diagnosis not present

## 2019-07-30 DIAGNOSIS — Z0000 Encounter for general adult medical examination without abnormal findings: Secondary | ICD-10-CM | POA: Diagnosis not present

## 2019-07-30 DIAGNOSIS — E7800 Pure hypercholesterolemia, unspecified: Secondary | ICD-10-CM | POA: Diagnosis not present

## 2019-07-30 DIAGNOSIS — E1122 Type 2 diabetes mellitus with diabetic chronic kidney disease: Secondary | ICD-10-CM | POA: Diagnosis not present

## 2019-07-30 DIAGNOSIS — E441 Mild protein-calorie malnutrition: Secondary | ICD-10-CM | POA: Diagnosis not present

## 2019-09-25 DIAGNOSIS — R21 Rash and other nonspecific skin eruption: Secondary | ICD-10-CM | POA: Diagnosis not present

## 2020-01-28 ENCOUNTER — Encounter: Payer: Self-pay | Admitting: Emergency Medicine

## 2020-01-28 ENCOUNTER — Other Ambulatory Visit: Payer: Self-pay

## 2020-01-28 ENCOUNTER — Inpatient Hospital Stay
Admission: EM | Admit: 2020-01-28 | Discharge: 2020-02-04 | DRG: 522 | Disposition: A | Discharge status: Skilled Nursing Facility | Payer: Medicare HMO | Attending: Internal Medicine | Admitting: Internal Medicine

## 2020-01-28 ENCOUNTER — Emergency Department: Payer: Medicare HMO

## 2020-01-28 ENCOUNTER — Inpatient Hospital Stay: Payer: Medicare HMO

## 2020-01-28 DIAGNOSIS — Z66 Do not resuscitate: Secondary | ICD-10-CM | POA: Diagnosis present

## 2020-01-28 DIAGNOSIS — M2550 Pain in unspecified joint: Secondary | ICD-10-CM | POA: Diagnosis not present

## 2020-01-28 DIAGNOSIS — E1129 Type 2 diabetes mellitus with other diabetic kidney complication: Secondary | ICD-10-CM | POA: Diagnosis present

## 2020-01-28 DIAGNOSIS — E890 Postprocedural hypothyroidism: Secondary | ICD-10-CM | POA: Diagnosis not present

## 2020-01-28 DIAGNOSIS — E119 Type 2 diabetes mellitus without complications: Secondary | ICD-10-CM | POA: Diagnosis not present

## 2020-01-28 DIAGNOSIS — Y92009 Unspecified place in unspecified non-institutional (private) residence as the place of occurrence of the external cause: Secondary | ICD-10-CM

## 2020-01-28 DIAGNOSIS — Z4789 Encounter for other orthopedic aftercare: Secondary | ICD-10-CM | POA: Diagnosis not present

## 2020-01-28 DIAGNOSIS — R2681 Unsteadiness on feet: Secondary | ICD-10-CM | POA: Diagnosis not present

## 2020-01-28 DIAGNOSIS — Z96641 Presence of right artificial hip joint: Secondary | ICD-10-CM | POA: Diagnosis not present

## 2020-01-28 DIAGNOSIS — W19XXXA Unspecified fall, initial encounter: Secondary | ICD-10-CM | POA: Diagnosis present

## 2020-01-28 DIAGNOSIS — I10 Essential (primary) hypertension: Secondary | ICD-10-CM | POA: Diagnosis not present

## 2020-01-28 DIAGNOSIS — E876 Hypokalemia: Secondary | ICD-10-CM | POA: Diagnosis present

## 2020-01-28 DIAGNOSIS — N390 Urinary tract infection, site not specified: Secondary | ICD-10-CM | POA: Diagnosis not present

## 2020-01-28 DIAGNOSIS — S72011A Unspecified intracapsular fracture of right femur, initial encounter for closed fracture: Principal | ICD-10-CM | POA: Diagnosis present

## 2020-01-28 DIAGNOSIS — S0990XA Unspecified injury of head, initial encounter: Secondary | ICD-10-CM | POA: Diagnosis not present

## 2020-01-28 DIAGNOSIS — E1165 Type 2 diabetes mellitus with hyperglycemia: Secondary | ICD-10-CM | POA: Diagnosis not present

## 2020-01-28 DIAGNOSIS — F0390 Unspecified dementia without behavioral disturbance: Secondary | ICD-10-CM | POA: Diagnosis present

## 2020-01-28 DIAGNOSIS — S72009A Fracture of unspecified part of neck of unspecified femur, initial encounter for closed fracture: Secondary | ICD-10-CM

## 2020-01-28 DIAGNOSIS — S7291XD Unspecified fracture of right femur, subsequent encounter for closed fracture with routine healing: Secondary | ICD-10-CM | POA: Diagnosis not present

## 2020-01-28 DIAGNOSIS — Z7401 Bed confinement status: Secondary | ICD-10-CM | POA: Diagnosis not present

## 2020-01-28 DIAGNOSIS — S72031A Displaced midcervical fracture of right femur, initial encounter for closed fracture: Secondary | ICD-10-CM | POA: Diagnosis not present

## 2020-01-28 DIAGNOSIS — N289 Disorder of kidney and ureter, unspecified: Secondary | ICD-10-CM | POA: Diagnosis not present

## 2020-01-28 DIAGNOSIS — Z79899 Other long term (current) drug therapy: Secondary | ICD-10-CM | POA: Diagnosis not present

## 2020-01-28 DIAGNOSIS — Z7984 Long term (current) use of oral hypoglycemic drugs: Secondary | ICD-10-CM | POA: Diagnosis not present

## 2020-01-28 DIAGNOSIS — Z20822 Contact with and (suspected) exposure to covid-19: Secondary | ICD-10-CM | POA: Diagnosis not present

## 2020-01-28 DIAGNOSIS — S299XXA Unspecified injury of thorax, initial encounter: Secondary | ICD-10-CM | POA: Diagnosis not present

## 2020-01-28 DIAGNOSIS — N3000 Acute cystitis without hematuria: Secondary | ICD-10-CM | POA: Diagnosis not present

## 2020-01-28 DIAGNOSIS — F0150 Vascular dementia without behavioral disturbance: Secondary | ICD-10-CM | POA: Diagnosis not present

## 2020-01-28 DIAGNOSIS — S72001A Fracture of unspecified part of neck of right femur, initial encounter for closed fracture: Secondary | ICD-10-CM | POA: Diagnosis not present

## 2020-01-28 DIAGNOSIS — Z471 Aftercare following joint replacement surgery: Secondary | ICD-10-CM | POA: Diagnosis not present

## 2020-01-28 DIAGNOSIS — R519 Headache, unspecified: Secondary | ICD-10-CM | POA: Diagnosis not present

## 2020-01-28 DIAGNOSIS — F5089 Other specified eating disorder: Secondary | ICD-10-CM | POA: Diagnosis not present

## 2020-01-28 DIAGNOSIS — M6281 Muscle weakness (generalized): Secondary | ICD-10-CM | POA: Diagnosis not present

## 2020-01-28 DIAGNOSIS — R404 Transient alteration of awareness: Secondary | ICD-10-CM | POA: Diagnosis not present

## 2020-01-28 LAB — BASIC METABOLIC PANEL
Anion gap: 13 (ref 5–15)
BUN: 20 mg/dL (ref 8–23)
CO2: 26 mmol/L (ref 22–32)
Calcium: 9.7 mg/dL (ref 8.9–10.3)
Chloride: 98 mmol/L (ref 98–111)
Creatinine, Ser: 0.94 mg/dL (ref 0.44–1.00)
GFR calc Af Amer: >60 mL/min (ref >60)
GFR calc non Af Amer: 55 mL/min — ABNORMAL LOW (ref >60)
Glucose, Bld: 299 mg/dL — ABNORMAL HIGH (ref 70–99)
Potassium: 3.3 mmol/L — ABNORMAL LOW (ref 3.5–5.1)
Sodium: 137 mmol/L (ref 135–145)

## 2020-01-28 LAB — TYPE AND SCREEN
ABO/RH(D): O POS
Antibody Screen: NEGATIVE

## 2020-01-28 LAB — URINALYSIS, COMPLETE (UACMP) WITH MICROSCOPIC
Bilirubin Urine: NEGATIVE
Glucose, UA: >=500 mg/dL — AB
Ketones, ur: NEGATIVE mg/dL
Nitrite: NEGATIVE
Protein, ur: 100 mg/dL — AB
Specific Gravity, Urine: 1.026 (ref 1.005–1.030)
pH: 5 (ref 5.0–8.0)

## 2020-01-28 LAB — RESPIRATORY PANEL BY RT PCR (FLU A&B, COVID)
Influenza A by PCR: NEGATIVE
Influenza B by PCR: NEGATIVE
SARS Coronavirus 2 by RT PCR: NEGATIVE

## 2020-01-28 LAB — HEPATIC FUNCTION PANEL
ALT: 23 U/L (ref 0–44)
AST: 37 U/L (ref 15–41)
Albumin: 4.1 g/dL (ref 3.5–5.0)
Alkaline Phosphatase: 46 U/L (ref 38–126)
Bilirubin, Direct: 0.2 mg/dL (ref 0.0–0.2)
Indirect Bilirubin: 1.4 mg/dL — ABNORMAL HIGH (ref 0.3–0.9)
Total Bilirubin: 1.6 mg/dL — ABNORMAL HIGH (ref 0.3–1.2)
Total Protein: 7.9 g/dL (ref 6.5–8.1)

## 2020-01-28 LAB — CBC
HCT: 36.9 % (ref 36.0–46.0)
Hemoglobin: 12.1 g/dL (ref 12.0–15.0)
MCH: 28.5 pg (ref 26.0–34.0)
MCHC: 32.8 g/dL (ref 30.0–36.0)
MCV: 87 fL (ref 80.0–100.0)
Platelets: 178 10*3/uL (ref 150–400)
RBC: 4.24 MIL/uL (ref 3.87–5.11)
RDW: 13.6 % (ref 11.5–15.5)
WBC: 8.4 10*3/uL (ref 4.0–10.5)
nRBC: 0 % (ref 0.0–0.2)

## 2020-01-28 LAB — APTT
aPTT: 28 seconds (ref 24–36)
aPTT: 28 seconds (ref 24–36)

## 2020-01-28 LAB — GLUCOSE, CAPILLARY: Glucose-Capillary: 237 mg/dL — ABNORMAL HIGH (ref 70–99)

## 2020-01-28 LAB — PROTIME-INR
INR: 1.3 — ABNORMAL HIGH (ref 0.8–1.2)
INR: 1.2 (ref 0.8–1.2)
Prothrombin Time: 15.7 seconds — ABNORMAL HIGH (ref 11.4–15.2)
Prothrombin Time: 15.4 seconds — ABNORMAL HIGH (ref 11.4–15.2)

## 2020-01-28 LAB — TROPONIN I (HIGH SENSITIVITY): Troponin I (High Sensitivity): 3 ng/L (ref <18)

## 2020-01-28 MED ORDER — HYDRALAZINE HCL 20 MG/ML IJ SOLN
5.0000 mg | INTRAMUSCULAR | Status: DC | PRN
  Administered 2020-01-29 – 2020-02-01 (×2): 5 mg via INTRAVENOUS
  Filled 2020-01-28 (×5): qty 0.25

## 2020-01-28 MED ORDER — INSULIN ASPART 100 UNIT/ML ~~LOC~~ SOLN
0.0000 [IU] | Freq: Every day | SUBCUTANEOUS | Status: DC
  Administered 2020-01-28 – 2020-01-29 (×2): 2 [IU] via SUBCUTANEOUS
  Filled 2020-01-28 (×2): qty 1

## 2020-01-28 MED ORDER — MIRTAZAPINE 15 MG PO TABS
7.5000 mg | ORAL_TABLET | Freq: Every day | ORAL | Status: DC
  Administered 2020-01-30 – 2020-02-04 (×6): 7.5 mg via ORAL
  Filled 2020-01-28 (×6): qty 1

## 2020-01-28 MED ORDER — SODIUM CHLORIDE 0.9 % IV SOLN
1.0000 g | INTRAVENOUS | Status: AC
  Administered 2020-01-28 – 2020-01-30 (×3): 1 g via INTRAVENOUS
  Filled 2020-01-28: qty 10
  Filled 2020-01-28 (×2): qty 1

## 2020-01-28 MED ORDER — ACETAMINOPHEN 325 MG PO TABS
650.0000 mg | ORAL_TABLET | Freq: Once | ORAL | Status: AC
  Administered 2020-01-28: 14:00:00 650 mg via ORAL
  Filled 2020-01-28: qty 2

## 2020-01-28 MED ORDER — OXYCODONE-ACETAMINOPHEN 5-325 MG PO TABS
1.0000 | ORAL_TABLET | ORAL | Status: DC | PRN
  Filled 2020-01-28: qty 1

## 2020-01-28 MED ORDER — MORPHINE SULFATE (PF) 2 MG/ML IV SOLN: 1.0000 mg | INTRAVENOUS | Status: DC | PRN

## 2020-01-28 MED ORDER — POTASSIUM CHLORIDE CRYS ER 20 MEQ PO TBCR
30.0000 meq | EXTENDED_RELEASE_TABLET | Freq: Once | ORAL | Status: AC
  Administered 2020-01-28: 30 meq via ORAL
  Filled 2020-01-28: qty 2

## 2020-01-28 MED ORDER — CEFAZOLIN SODIUM-DEXTROSE 2-4 GM/100ML-% IV SOLN
2.0000 g | INTRAVENOUS | Status: AC
  Administered 2020-01-29: 2 g via INTRAVENOUS

## 2020-01-28 MED ORDER — DONEPEZIL HCL 5 MG PO TABS
5.0000 mg | ORAL_TABLET | Freq: Every day | ORAL | Status: DC
  Administered 2020-01-30 – 2020-02-04 (×6): 5 mg via ORAL
  Filled 2020-01-28 (×6): qty 1

## 2020-01-28 MED ORDER — INSULIN ASPART 100 UNIT/ML ~~LOC~~ SOLN
0.0000 [IU] | Freq: Three times a day (TID) | SUBCUTANEOUS | Status: DC
  Administered 2020-01-29: 2 [IU] via SUBCUTANEOUS
  Administered 2020-01-29: 3 [IU] via SUBCUTANEOUS
  Administered 2020-01-29: 2 [IU] via SUBCUTANEOUS
  Administered 2020-01-30: 8 [IU] via SUBCUTANEOUS
  Administered 2020-01-30: 2 [IU] via SUBCUTANEOUS
  Administered 2020-01-31: 3 [IU] via SUBCUTANEOUS
  Administered 2020-01-31: 2 [IU] via SUBCUTANEOUS
  Administered 2020-01-31: 8 [IU] via SUBCUTANEOUS
  Administered 2020-02-01: 2 [IU] via SUBCUTANEOUS
  Administered 2020-02-01: 3 [IU] via SUBCUTANEOUS
  Administered 2020-02-01: 8 [IU] via SUBCUTANEOUS
  Administered 2020-02-02: 3 [IU] via SUBCUTANEOUS
  Administered 2020-02-02 (×2): 5 [IU] via SUBCUTANEOUS
  Administered 2020-02-03: 8 [IU] via SUBCUTANEOUS
  Administered 2020-02-03 (×2): 3 [IU] via SUBCUTANEOUS
  Administered 2020-02-04: 5 [IU] via SUBCUTANEOUS
  Filled 2020-01-28 (×19): qty 1

## 2020-01-28 MED ORDER — FENTANYL CITRATE (PF) 100 MCG/2ML IJ SOLN: 25.0000 ug | INTRAMUSCULAR | Status: DC | PRN

## 2020-01-28 MED ORDER — METHOCARBAMOL 500 MG PO TABS
500.0000 mg | ORAL_TABLET | Freq: Three times a day (TID) | ORAL | Status: DC | PRN
  Filled 2020-01-28: qty 1

## 2020-01-28 MED ORDER — SENNOSIDES-DOCUSATE SODIUM 8.6-50 MG PO TABS: 1.0000 | ORAL_TABLET | Freq: Every evening | ORAL | Status: DC | PRN

## 2020-01-28 MED ORDER — AMLODIPINE BESYLATE 5 MG PO TABS
2.5000 mg | ORAL_TABLET | Freq: Every day | ORAL | Status: DC
  Administered 2020-01-28 – 2020-02-02 (×5): 2.5 mg via ORAL
  Filled 2020-01-28 (×5): qty 1

## 2020-01-28 MED ORDER — ACETAMINOPHEN 325 MG PO TABS
650.0000 mg | ORAL_TABLET | Freq: Four times a day (QID) | ORAL | Status: DC | PRN
  Administered 2020-02-01: 650 mg via ORAL
  Filled 2020-01-28: qty 2

## 2020-01-28 NOTE — ED Notes (Signed)
Light green, lavender, grey and blue tubes sent to lab

## 2020-01-28 NOTE — Consult Note (Signed)
ORTHOPAEDIC CONSULTATION  REQUESTING PHYSICIAN: Lorretta Harp, MD  Chief Complaint: Right hip pain status post fall  HPI: Melanie Turner is a 86 y.o. female who complains of right hip pain after a fall at home today.  Patient is seen in the emergency department with her daughter at the bedside.  Patient complains of mild to moderate right hip pain.  She denies other injuries.  The daughter explains that she fell while trying to pull an elastic sheet over her mattress and lost her balance.  Patient was brought to the [facility] emergency department where x-rays revealed a displaced right femoral neck Fracture.  Orthopedics is consulted for management of the fracture.  Past Medical History:  Diagnosis Date  . Diabetes mellitus without complication (HCC)   . Hypertension    Past Surgical History:  Procedure Laterality Date  . THYROIDECTOMY     Social History   Socioeconomic History  . Marital status: Divorced    Spouse name: Not on file  . Number of children: Not on file  . Years of education: Not on file  . Highest education level: Not on file  Occupational History  . Not on file  Tobacco Use  . Smoking status: Never Smoker  . Smokeless tobacco: Never Used  Substance and Sexual Activity  . Alcohol use: Yes  . Drug use: No  . Sexual activity: Never  Other Topics Concern  . Not on file  Social History Narrative  . Not on file   Social Determinants of Health   Financial Resource Strain:   . Difficulty of Paying Living Expenses:   Food Insecurity:   . Worried About Programme researcher, broadcasting/film/video in the Last Year:   . Barista in the Last Year:   Transportation Needs:   . Freight forwarder (Medical):   Marland Kitchen Lack of Transportation (Non-Medical):   Physical Activity:   . Days of Exercise per Week:   . Minutes of Exercise per Session:   Stress:   . Feeling of Stress :   Social Connections:   . Frequency of Communication with Friends and Family:   . Frequency of  Social Gatherings with Friends and Family:   . Attends Religious Services:   . Active Member of Clubs or Organizations:   . Attends Banker Meetings:   Marland Kitchen Marital Status:    Family History  Problem Relation Age of Onset  . Cancer Brother        not know which type of cancer   No Known Allergies Prior to Admission medications   Medication Sig Start Date End Date Taking? Authorizing Provider  donepezil (ARICEPT) 5 MG tablet Take 5 mg by mouth daily.  06/01/18  Yes [provider]  metFORMIN (GLUCOPHAGE) 500 MG tablet Take 1 tablet (500 mg total) by mouth 2 (two) times daily with a meal for 30 days. Patient taking differently: Take 500 mg by mouth daily with breakfast.  03/14/19 01/28/20 Yes Amin, Loura Halt, MD  mirtazapine (REMERON) 7.5 MG tablet Take 7.5 mg by mouth daily.  08/31/18  Yes [provider]   CT HEAD WO CONTRAST  Result Date: 01/28/2020 CLINICAL DATA:  Trauma, headache EXAM: CT HEAD WITHOUT CONTRAST TECHNIQUE: Contiguous axial images were obtained from the base of the skull through the vertex without intravenous contrast. COMPARISON:  03/08/2019 FINDINGS: Brain: There is no acute intracranial hemorrhage, mass effect, or edema. Gray-white differentiation is preserved. There is no extra-axial fluid collection. Patchy hypoattenuation in the  supratentorial white matter is nonspecific but probably reflects stable chronic microvascular ischemic changes. Prominence of the ventricles and sulci reflects stable parenchymal volume loss. There is disproportionate medial temporal lobe volume loss with ex vacuo dilatation of the temporal horns. Vascular: No hyperdense vessel or unexpected calcification. Skull: Calvarium is unremarkable. Sinuses/Orbits: No acute finding. Other: None. IMPRESSION: No acute intracranial hemorrhage or mass effect. Stable chronic findings detailed above. Electronically Signed   By: Macy Mis M.D.   On: 01/28/2020 17:07   DG Chest  Portable 1 View  Result Date: 01/28/2020 CLINICAL DATA:  Pain following fall EXAM: PORTABLE CHEST 1 VIEW COMPARISON:  Mar 10, 2019. FINDINGS: There is slight scarring in each upper lobe. Lungs elsewhere clear. Heart size and pulmonary vascularity are normal. There is aortic atherosclerosis. No pneumothorax. No bone lesions. IMPRESSION: Mild upper lobe scarring. No edema or consolidation. Heart size normal. Aortic Atherosclerosis (ICD10-I70.0). Electronically Signed   By: Lowella Grip III M.D.   On: 01/28/2020 13:05   DG Hip Unilat W or Wo Pelvis 2-3 Views Right  Result Date: 01/28/2020 CLINICAL DATA:  Pain following fall EXAM: DG HIP (WITH OR WITHOUT PELVIS) 2-3V RIGHT COMPARISON:  None. FINDINGS: Frontal pelvis as well as frontal and lateral views obtained. There is a subcapital femoral neck fracture with mild impaction at the fracture site. There is Verus angulation at the fracture site. No other fracture. No dislocation. Bones are osteoporotic. There is moderate narrowing of each hip joint. IMPRESSION: Subcapital femoral neck fracture on the right with impaction and varus angulation at fracture site. No other fracture. No dislocation. Bones osteoporotic. Moderate symmetric narrowing noted in each hip joint. Electronically Signed   By: Lowella Grip III M.D.   On: 01/28/2020 13:04    Positive ROS: All other systems have been reviewed and were otherwise negative with the exception of those mentioned in the HPI and as above.  Physical Exam: General: Awake, no acute distress  MUSCULOSKELETAL: Right lower extremity: Patient skin is intact.  There is no erythema ecchymosis or significant swelling of the right thigh.  Her compartments are soft and compressible.  Patient has shortening and external rotation of the right lower extremity.  She has palpable pedal pulses in both feet.  She has intact sensation light touch bilaterally and has intact motor function distally in both lower  extremities.  Assessment: Right displaced femoral neck hip fracture  Plan: I explained to the patient and her daughter that she has a displaced right femoral neck hip fracture.  I have recommended a right hip hemiarthroplasty as treatment for this fracture.  Patient is currently on the OR schedule for tomorrow at 1 PM pending medical clearance . I discussed the risks and benefits of surgery with the patient and her daughter. They understand the risks include but are not limited to infection, bleeding requiring blood transfusion, nerve or blood vessel injury, joint stiffness or loss of motion, persistent pain, weakness or instability, change in lower extremity rotation, leg length discrepancy, fracture, dislocation and hardware failure and the need for further surgery. Medical risks include but are not limited to DVT and pulmonary embolism, myocardial infarction, stroke, pneumonia, respiratory failure and death. Patient and her daughter understood these risks and wished to proceed.   I reviewed the patient's x-rays and laboratory studies in preparation for this case.  Patient will be n.p.o. after midnight and should avoid anticoagulation in preparation for surgery tomorrow.   Thornton Park, MD    01/28/2020 7:07 PM

## 2020-01-28 NOTE — ED Provider Notes (Signed)
[facility] Emergency Department Provider Note    First MD Initiated Contact with Patient 01/28/20 1138     (approximate)  I have reviewed the triage vital signs and the nursing notes.   HISTORY  Chief Complaint Weakness  Level V Caveat:  AMS - dementia  HPI Melanie Turner is a 86 y.o. female below listed past medical history presents to the ER for evaluation of inability to walk after a fall yesterday.  She does have history of dementia and history is provided by son who states that he found her sitting on the floor yesterday and since then has not put any weight on her right leg.  She is complaining of some right hip pain.  Did not hit her head.  Not currently any blood thinners.    Past Medical History:  Diagnosis Date  . Diabetes mellitus without complication ([place])   . Hypertension    No family history on file. Past Surgical History:  Procedure Laterality Date  . THYROIDECTOMY     Patient Active Problem List   Diagnosis Date Noted  . DM2 (diabetes mellitus, type 2) ([place]) 03/14/2019  . Benign essential HTN 03/14/2019  . Seizure ([place]) 03/08/2019      Prior to Admission medications   Medication Sig Start Date End Date Taking? Authorizing Provider  amLODipine (NORVASC) 5 MG tablet Take 1 tablet (5 mg total) by mouth daily for 30 days. 03/14/19 04/13/19  Amin, Jeanella Flattery, MD  Ascorbic Acid (VITAMIN C) 500 MG CHEW Chew 1,000 mg by mouth daily.    [provider]  blood glucose meter kit and supplies KIT Dispense based on patient and insurance preference. Use up to four times daily as directed. (FOR ICD-9 250.00, 250.01). 03/14/19   Amin, Jeanella Flattery, MD  donepezil (ARICEPT) 5 MG tablet Take 5 mg by mouth daily.  06/01/18   [provider]  levETIRAcetam (KEPPRA) 500 MG tablet Take 1 tablet (500 mg total) by mouth 2 (two) times daily for 30 days. 03/14/19 04/13/19  Amin, Jeanella Flattery, MD  linagliptin (TRADJENTA) 5 MG TABS tablet Take  1 tablet (5 mg total) by mouth daily for 30 days. 03/14/19 04/13/19  Amin, Jeanella Flattery, MD  metFORMIN (GLUCOPHAGE) 500 MG tablet Take 1 tablet (500 mg total) by mouth 2 (two) times daily with a meal for 30 days. 03/14/19 04/13/19  Amin, Jeanella Flattery, MD  mirtazapine (REMERON) 7.5 MG tablet Take 7.5 mg by mouth daily.  08/31/18   [provider]    Allergies Patient has no known allergies.    Social History Social History   Tobacco Use  . Smoking status: Never Smoker  . Smokeless tobacco: Never Used  Substance Use Topics  . Alcohol use: Yes  . Drug use: No    Review of Systems Patient denies headaches, rhinorrhea, blurry vision, numbness, shortness of breath, chest pain, edema, cough, abdominal pain, nausea, vomiting, diarrhea, dysuria, fevers, rashes or hallucinations unless otherwise stated above in HPI. ____________________________________________   PHYSICAL EXAM:  VITAL SIGNS: Vitals:   01/28/20 1200 01/28/20 1230  BP: (!) 174/98 (!) 182/98  Pulse: 86 80  Resp: 20 18  SpO2: 98% 99%    Constitutional: calm, nontoxic appearing Eyes: Conjunctivae are normal.  Head: Atraumatic. Nose: No congestion/rhinnorhea. Mouth/Throat: Mucous membranes are moist.   Neck: No stridor. Painless ROM.  Cardiovascular: Normal rate, regular rhythm. Grossly normal heart sounds.  Good peripheral circulation. Respiratory: Normal respiratory effort.  No retractions. Lungs CTAB. Gastrointestinal: Soft  and nontender. No distention. No abdominal bruits. No CVA tenderness. Genitourinary: deferred Musculoskeletal: pain with log roll of right hip,  RLE shortened.  N/V distally, thigh compartment soft Neurologic:  Normal speech and language. No gross focal neurologic deficits are appreciated. No facial droop Skin:  Skin is warm, dry and intact. No rash noted. Psychiatric: Mood and affect are normal. Speech and behavior are normal.  ____________________________________________   LABS (all  labs ordered are listed, but only abnormal results are displayed)  Results for orders placed or performed during the hospital encounter of 01/28/20 (from the past 24 hour(s))  Basic metabolic panel     Status: Abnormal   Collection Time: 01/28/20 11:57 AM  Result Value Ref Range   Sodium 137 135 - 145 mmol/L   Potassium 3.3 (L) 3.5 - 5.1 mmol/L   Chloride 98 98 - 111 mmol/L   CO2 26 22 - 32 mmol/L   Glucose, Bld 299 (H) 70 - 99 mg/dL   BUN 20 8 - 23 mg/dL   Creatinine, Ser 0.94 0.44 - 1.00 mg/dL   Calcium 9.7 8.9 - 10.3 mg/dL   GFR calc non Af Amer 55 (L) >60 mL/min   GFR calc Af Amer >60 >60 mL/min   Anion gap 13 5 - 15  CBC     Status: None   Collection Time: 01/28/20 11:57 AM  Result Value Ref Range   WBC 8.4 4.0 - 10.5 K/uL   RBC 4.24 3.87 - 5.11 MIL/uL   Hemoglobin 12.1 12.0 - 15.0 g/dL   HCT 36.9 36.0 - 46.0 %   MCV 87.0 80.0 - 100.0 fL   MCH 28.5 26.0 - 34.0 pg   MCHC 32.8 30.0 - 36.0 g/dL   RDW 13.6 11.5 - 15.5 %   Platelets 178 150 - 400 K/uL   nRBC 0.0 0.0 - 0.2 %  Hepatic function panel     Status: Abnormal   Collection Time: 01/28/20 11:57 AM  Result Value Ref Range   Total Protein 7.9 6.5 - 8.1 g/dL   Albumin 4.1 3.5 - 5.0 g/dL   AST 37 15 - 41 U/L   ALT 23 0 - 44 U/L   Alkaline Phosphatase 46 38 - 126 U/L   Total Bilirubin 1.6 (H) 0.3 - 1.2 mg/dL   Bilirubin, Direct 0.2 0.0 - 0.2 mg/dL   Indirect Bilirubin 1.4 (H) 0.3 - 0.9 mg/dL  Troponin I (High Sensitivity)     Status: None   Collection Time: 01/28/20 11:57 AM  Result Value Ref Range   Troponin I (High Sensitivity) 3 <18 ng/L  Urinalysis, Complete w Microscopic     Status: Abnormal   Collection Time: 01/28/20  1:13 PM  Result Value Ref Range   Color, Urine YELLOW (A) YELLOW   APPearance HAZY (A) CLEAR   Specific Gravity, Urine 1.026 1.005 - 1.030   pH 5.0 5.0 - 8.0   Glucose, UA >=500 (A) NEGATIVE mg/dL   Hgb urine dipstick MODERATE (A) NEGATIVE   Bilirubin Urine NEGATIVE NEGATIVE   Ketones, ur  NEGATIVE NEGATIVE mg/dL   Protein, ur 100 (A) NEGATIVE mg/dL   Nitrite NEGATIVE NEGATIVE   Leukocytes,Ua TRACE (A) NEGATIVE   RBC / HPF 0-5 0 - 5 RBC/hpf   WBC, UA 11-20 0 - 5 WBC/hpf   Bacteria, UA RARE (A) NONE SEEN   Squamous Epithelial / LPF 0-5 0 - 5   Mucus PRESENT    ____________________________________________  EKG My review and personal interpretation at  Time: 11:36   Indication: hip fracture  Rate: 90  Rhythm: sinus Axis: normal Other: normal intervals, no stemi ____________________________________________  RADIOLOGY  I personally reviewed all radiographic images ordered to evaluate for the above acute complaints and reviewed radiology reports and findings.  These findings were personally discussed with the patient.  Please see medical record for radiology report.  ____________________________________________   PROCEDURES  Procedure(s) performed:  Procedures    Critical Care performed: no ____________________________________________   INITIAL IMPRESSION / ASSESSMENT AND PLAN / ED COURSE  Pertinent labs & imaging results that were available during my care of the patient were reviewed by me and considered in my medical decision making (see chart for details).   DDX: fracture, dislocation, dehydration, uti, anemia  Neftaly Kuntzman is a 86 y.o. who presents to the ED with fall yesterday with evidence of right hip injury as described above.  Blood work does show mild hyperglycemia.  Will give IV fluids.  No reported head injury.  She is otherwise at her baseline.  X-ray shows evidence of acute right hip fracture.  Notify Dr. Mack Guise of orthopedics who is currently in the OR.  Will discuss case with hospitalist for admission.     The patient was evaluated in Emergency Department today for the symptoms described in the history of present illness. He/she was evaluated in the context of the global COVID-19 pandemic, which necessitated consideration that the patient  might be at risk for infection with the SARS-CoV-2 virus that causes COVID-19. Institutional protocols and algorithms that pertain to the evaluation of patients at risk for COVID-19 are in a state of rapid change based on information released by regulatory bodies including the CDC and federal and state organizations. These policies and algorithms were followed during the patient's care in the ED.  As part of my medical decision making, I reviewed the following data within the [facility] notes reviewed and incorporated, Labs reviewed, notes from prior ED visits and [place] Controlled Substance Database   ____________________________________________   FINAL CLINICAL IMPRESSION(S) / ED DIAGNOSES  Final diagnoses:  Closed fracture of right hip, initial encounter ([place])      NEW MEDICATIONS STARTED DURING THIS VISIT:  New Prescriptions   No medications on file     Note:  This document was prepared using Dragon voice recognition software and may include unintentional dictation errors.    Merlyn Lot, MD 01/28/20 1331

## 2020-01-28 NOTE — ED Notes (Signed)
Pt assisted to bed by this RN and Clinical biochemist. Pt unable to assist at all during transfer but reports no pain at this time. This RN noted weakness to right leg. Pt alert but not oriented to place, pt states she is in [place]. Pt is a poor historian but son reports she fell yesterday morning while making the bed and was sitting on her butt in the floor when he entered the room. Son reports pt usually walks without assistance around the house but has not walked since yesterday.

## 2020-01-28 NOTE — ED Notes (Signed)
Ok to discontinue repeat troponins per Dr. Roxan Hockey

## 2020-01-28 NOTE — H&P (Signed)
History and Physical    Melanie Turner EGB:[id number] DOB: 22-Aug-1932 DOA: 01/28/2020  Referring MD/NP/PA:   PCP: Kirk Ruths, MD   Patient coming from:  The patient is coming from home.  At baseline, pt is partially dependent for most of ADL.        Chief Complaint: fall and right hip pain  HPI: Melanie Turner is a 86 y.o. female with medical history significant of dementia, hypertension, diabetes mellitus, depression, one-time seizure, who presents with fall, right hip pain.  Per her son, pt accidentally fell when she was trying to reach something on the bed.  She injured her right hip, causing severe pain in the right hip.  She could not stand up after the fall. No LOC.  No unilateral numbness or tingling symptoms system no facial droop or slurred speech.  Patient does not have chest pain, shortness breath, cough, fever or chills.  No nausea, vomiting, diarrhea, abdominal pain.  Patient has dysuria and burning on urination.  ED Course: pt was found to have WBC 8.4, troponin 3, positive urinalysis for UTI, potassium 3.3, renal function okay, temperature normal, blood pressure 182/98, heart rate 80, oxygen saturation 98%.  Chest x-ray negative. X-ray of right hip showed subcapital femoral neck fracture on the right with impaction and varus angulation at fracture site. Review of Systems:   General: no fevers, chills, no body weight gain, has fatigue HEENT: no blurry vision, hearing changes or sore throat Respiratory: no dyspnea, coughing, wheezing CV: no chest pain, no palpitations GI: no nausea, vomiting, abdominal pain, diarrhea, constipation GU: no dysuria, burning on urination, increased urinary frequency, hematuria  Ext: no leg edema Neuro: no unilateral weakness, numbness, or tingling, no vision change or hearing loss. Has fall.  Skin: no rash, no skin tear. MSK: has right hip tenderness Heme: No easy bruising.  Travel history: No recent long distant travel.  Allergy:  No Known Allergies  Past Medical History:  Diagnosis Date  . Diabetes mellitus without complication ([place])   . Hypertension     Past Surgical History:  Procedure Laterality Date  . THYROIDECTOMY      Social History:  reports that she has never smoked. She has never used smokeless tobacco. She reports current alcohol use. She reports that she does not use drugs.  Family History:  Family History  Problem Relation Age of Onset  . Cancer Brother        not know which type of cancer     Prior to Admission medications   Medication Sig Start Date End Date Taking? Authorizing Provider  donepezil (ARICEPT) 5 MG tablet Take 5 mg by mouth daily.  06/01/18  Yes [provider]  metFORMIN (GLUCOPHAGE) 500 MG tablet Take 1 tablet (500 mg total) by mouth 2 (two) times daily with a meal for 30 days. Patient taking differently: Take 500 mg by mouth daily with breakfast.  03/14/19 01/28/20 Yes Amin, Jeanella Flattery, MD  mirtazapine (REMERON) 7.5 MG tablet Take 7.5 mg by mouth daily.  08/31/18  Yes [provider]  amLODipine (NORVASC) 5 MG tablet Take 1 tablet (5 mg total) by mouth daily for 30 days. 03/14/19 04/13/19  Amin, Jeanella Flattery, MD  Ascorbic Acid (VITAMIN C) 500 MG CHEW Chew 1,000 mg by mouth daily.    [provider]  blood glucose meter kit and supplies KIT Dispense based on patient and insurance preference. Use up to four times daily as directed. (FOR ICD-9 250.00, 250.01). 03/14/19   Reesa Chew,  Ankit Chirag, MD  levETIRAcetam (KEPPRA) 500 MG tablet Take 1 tablet (500 mg total) by mouth 2 (two) times daily for 30 days. 03/14/19 04/13/19  Amin, Jeanella Flattery, MD  linagliptin (TRADJENTA) 5 MG TABS tablet Take 1 tablet (5 mg total) by mouth daily for 30 days. 03/14/19 04/13/19  Damita Lack, MD    Physical Exam: Vitals:   01/28/20 1134 01/28/20 1200 01/28/20 1230 01/28/20 1500  BP:  (!) 174/98 (!) 182/98 (!) 173/95  Pulse:  86 80 75  Resp:  '20 18 16  ' SpO2:  98% 99% 98%    Weight: 54.4 kg     Height: '5\' 3"'  (1.6 m)      General: Not in acute distress HEENT:       Eyes: PERRL, EOMI, no scleral icterus.       ENT: No discharge from the ears and nose, no pharynx injection, no tonsillar enlargement.        Neck: No JVD, no bruit, no mass felt. Heme: No neck lymph node enlargement. Cardiac: S1/S2, RRR, No murmurs, No gallops or rubs. Respiratory: No rales, wheezing, rhonchi or rubs. GI: Soft, nondistended, nontender, no rebound pain, no organomegaly, BS present. GU: No hematuria Ext: No pitting leg edema bilaterally. 2+DP/PT pulse bilaterally. Musculoskeletal: has right hip tenderness. Skin: No rashes.  Neuro: Alert, oriented X3, cranial nerves II-XII grossly intact, moves all extremities. Psych: Patient is not psychotic, no suicidal or hemocidal ideation.  Labs on Admission: I have personally reviewed following labs and imaging studies  CBC: Recent Labs  Lab 01/28/20 1157  WBC 8.4  HGB 12.1  HCT 36.9  MCV 87.0  PLT 673   Basic Metabolic Panel: Recent Labs  Lab 01/28/20 1157  NA 137  K 3.3*  CL 98  CO2 26  GLUCOSE 299*  BUN 20  CREATININE 0.94  CALCIUM 9.7   GFR: Estimated Creatinine Clearance: 36.2 mL/min (by C-G formula based on SCr of 0.94 mg/dL). Liver Function Tests: Recent Labs  Lab 01/28/20 1157  AST 37  ALT 23  ALKPHOS 46  BILITOT 1.6*  PROT 7.9  ALBUMIN 4.1   No results for input(s): LIPASE, AMYLASE in the last 168 hours. No results for input(s): AMMONIA in the last 168 hours. Coagulation Profile: Recent Labs  Lab 01/28/20 1607  INR 1.3*   Cardiac Enzymes: No results for input(s): CKTOTAL, CKMB, CKMBINDEX, TROPONINI in the last 168 hours. BNP (last 3 results) No results for input(s): PROBNP in the last 8760 hours. HbA1C: No results for input(s): HGBA1C in the last 72 hours. CBG: No results for input(s): GLUCAP in the last 168 hours. Lipid Profile: No results for input(s): CHOL, HDL, LDLCALC, TRIG, CHOLHDL,  LDLDIRECT in the last 72 hours. Thyroid Function Tests: No results for input(s): TSH, T4TOTAL, FREET4, T3FREE, THYROIDAB in the last 72 hours. Anemia Panel: No results for input(s): VITAMINB12, FOLATE, FERRITIN, TIBC, IRON, RETICCTPCT in the last 72 hours. Urine analysis:    Component Value Date/Time   COLORURINE YELLOW (A) 01/28/2020 1313   APPEARANCEUR HAZY (A) 01/28/2020 1313   APPEARANCEUR Clear 06/30/2014 1501   LABSPEC 1.026 01/28/2020 1313   LABSPEC 1.011 06/30/2014 1501   PHURINE 5.0 01/28/2020 1313   GLUCOSEU >=500 (A) 01/28/2020 1313   GLUCOSEU Negative 06/30/2014 1501   HGBUR MODERATE (A) 01/28/2020 1313   BILIRUBINUR NEGATIVE 01/28/2020 1313   BILIRUBINUR Negative 06/30/2014 1501   KETONESUR NEGATIVE 01/28/2020 1313   PROTEINUR 100 (A) 01/28/2020 1313   NITRITE NEGATIVE 01/28/2020 1313  LEUKOCYTESUR TRACE (A) 01/28/2020 1313   LEUKOCYTESUR Trace 06/30/2014 1501   Sepsis Labs: '@LABRCNTIP' (procalcitonin:4,lacticidven:4) ) Recent Results (from the past 240 hour(s))  Respiratory Panel by RT PCR (Flu A&B, Covid) - Nasopharyngeal Swab     Status: None   Collection Time: 01/28/20  1:42 PM   Specimen: Nasopharyngeal Swab  Result Value Ref Range Status   SARS Coronavirus 2 by RT PCR NEGATIVE NEGATIVE Final    Comment: (NOTE) SARS-CoV-2 target nucleic acids are NOT DETECTED. The SARS-CoV-2 RNA is generally detectable in upper respiratoy specimens during the acute phase of infection. The lowest concentration of SARS-CoV-2 viral copies this assay can detect is 131 copies/mL. A negative result does not preclude SARS-Cov-2 infection and should not be used as the sole basis for treatment or other patient management decisions. A negative result may occur with  improper specimen collection/handling, submission of specimen other than nasopharyngeal swab, presence of viral mutation(s) within the areas targeted by this assay, and inadequate number of viral copies (<131  copies/mL). A negative result must be combined with clinical observations, patient history, and epidemiological information. The expected result is Negative. Fact Sheet for Patients:  PinkCheek.be Fact Sheet for Healthcare Providers:  GravelBags.it This test is not yet ap proved or cleared by the [place] FDA and  has been authorized for detection and/or diagnosis of SARS-CoV-2 by FDA under an Emergency Use Authorization (EUA). This EUA will remain  in effect (meaning this test can be used) for the duration of the COVID-19 declaration under Section 564(b)(1) of the Act, 21 U.S.C. section 360bbb-3(b)(1), unless the authorization is terminated or revoked sooner.    Influenza A by PCR NEGATIVE NEGATIVE Final   Influenza B by PCR NEGATIVE NEGATIVE Final    Comment: (NOTE) The Xpert Xpress SARS-CoV-2/FLU/RSV assay is intended as an aid in  the diagnosis of influenza from Nasopharyngeal swab specimens and  should not be used as a sole basis for treatment. Nasal washings and  aspirates are unacceptable for Xpert Xpress SARS-CoV-2/FLU/RSV  testing. Fact Sheet for Patients: PinkCheek.be Fact Sheet for Healthcare Providers: GravelBags.it This test is not yet approved or cleared by the [place] FDA and  has been authorized for detection and/or diagnosis of SARS-CoV-2 by  FDA under an Emergency Use Authorization (EUA). This EUA will remain  in effect (meaning this test can be used) for the duration of the  Covid-19 declaration under Section 564(b)(1) of the Act, 21  U.S.C. section 360bbb-3(b)(1), unless the authorization is  terminated or revoked. Performed at [facility], [address], [place], [place] [address]      Radiological Exams on Admission: DG Chest Portable 1 View  Result Date: 01/28/2020 CLINICAL DATA:  Pain following fall EXAM: PORTABLE  CHEST 1 VIEW COMPARISON:  Mar 10, 2019. FINDINGS: There is slight scarring in each upper lobe. Lungs elsewhere clear. Heart size and pulmonary vascularity are normal. There is aortic atherosclerosis. No pneumothorax. No bone lesions. IMPRESSION: Mild upper lobe scarring. No edema or consolidation. Heart size normal. Aortic Atherosclerosis (ICD10-I70.0). Electronically Signed   By: Lowella Grip III M.D.   On: 01/28/2020 13:05   DG Hip Unilat W or Wo Pelvis 2-3 Views Right  Result Date: 01/28/2020 CLINICAL DATA:  Pain following fall EXAM: DG HIP (WITH OR WITHOUT PELVIS) 2-3V RIGHT COMPARISON:  None. FINDINGS: Frontal pelvis as well as frontal and lateral views obtained. There is a subcapital femoral neck fracture with mild impaction at the fracture site. There is Verus angulation at  the fracture site. No other fracture. No dislocation. Bones are osteoporotic. There is moderate narrowing of each hip joint. IMPRESSION: Subcapital femoral neck fracture on the right with impaction and varus angulation at fracture site. No other fracture. No dislocation. Bones osteoporotic. Moderate symmetric narrowing noted in each hip joint. Electronically Signed   By: Lowella Grip III M.D.   On: 01/28/2020 13:04     EKG: Independently reviewed.  Sinus rhythm, QTC 504, LAD, nonspecific T wave change  Assessment/Plan Principal Problem:   Closed right hip fracture (HCC) Active Problems:   Type II diabetes mellitus with renal manifestations (HCC)   Benign essential HTN   UTI (urinary tract infection)   Dementia (HCC)   Hypokalemia   Fall at home, initial encounter   Closed right hip fracture [facility]): X-ray of right hip showed subcapital femoral neck fracture on the right with impaction and varus angulation at fracture site.  Ortho, Dr. Mack Guise is consulted.  - will admit to Med-surg bed  - Pain control: morphine prn and percocet - When necessary Zofran for nausea - Robaxin for muscle spasm - type and  cross - INR/PTT - PT/OT when able to (not ordered now)  Type II diabetes mellitus with renal manifestations ([place]): Most recent A1c 11.4, poorly controled. Patient is taking Metformin at home -SSI  HTN: Blood pressure 182/98, not taking medications at home -start amlodipine 2.5 mg daily -hydralazine prn  UTI (urinary tract infection) -Rocephin -Follow-up urine culture  Dementia [facility]): No behavior disturbance -Donepezil  Hypokalemia: K=3.3 on admission. - Repleted - Check Mg level  Fall at home, initial encounter: -f/u CT-head -pt/ot       DVT ppx: SCD Code Status: Full code Family Communication:  Yes, patient's son at bed side Disposition Plan: to be detemined Consults called:  Dr. Mack Guise Admission status: Med-surg bed as inpt    Date of Service 01/28/2020    [facility]   If 7PM-7AM, please contact night-coverage [email or web address] 01/28/2020, 4:53 PM

## 2020-01-28 NOTE — ED Notes (Signed)
Report given to Kate, RN

## 2020-01-28 NOTE — ED Notes (Signed)
Patient denies pain at this time. Purewick in place, brief dry. Patient does not want to be repositioned

## 2020-01-28 NOTE — ED Notes (Signed)
Pt given turkey sandwich tray and sprite.  

## 2020-01-28 NOTE — ED Triage Notes (Signed)
Pt in via POV, per son, pt woke up yesterday w/ difficulty standing due to new onset generalized weakness.  Pt denies any pain.  NAD noted at this time.

## 2020-01-29 ENCOUNTER — Inpatient Hospital Stay: Payer: Medicare HMO | Admitting: Certified Registered"

## 2020-01-29 ENCOUNTER — Encounter: Payer: Self-pay | Admitting: Internal Medicine

## 2020-01-29 ENCOUNTER — Encounter
Admission: EM | Disposition: A | Discharge status: Skilled Nursing Facility | Payer: Self-pay | Source: Home / Self Care | Attending: Internal Medicine

## 2020-01-29 ENCOUNTER — Inpatient Hospital Stay: Payer: Medicare HMO

## 2020-01-29 DIAGNOSIS — S72001A Fracture of unspecified part of neck of right femur, initial encounter for closed fracture: Secondary | ICD-10-CM

## 2020-01-29 HISTORY — PX: HIP ARTHROPLASTY: SHX981

## 2020-01-29 LAB — URINE CULTURE: Culture: NO GROWTH

## 2020-01-29 LAB — BASIC METABOLIC PANEL
Anion gap: 10 (ref 5–15)
BUN: 16 mg/dL (ref 8–23)
CO2: 26 mmol/L (ref 22–32)
Calcium: 9.1 mg/dL (ref 8.9–10.3)
Chloride: 104 mmol/L (ref 98–111)
Creatinine, Ser: 0.64 mg/dL (ref 0.44–1.00)
GFR calc Af Amer: >60 mL/min (ref >60)
GFR calc non Af Amer: >60 mL/min (ref >60)
Glucose, Bld: 184 mg/dL — ABNORMAL HIGH (ref 70–99)
Potassium: 3.2 mmol/L — ABNORMAL LOW (ref 3.5–5.1)
Sodium: 140 mmol/L (ref 135–145)

## 2020-01-29 LAB — GLUCOSE, CAPILLARY
Glucose-Capillary: 167 mg/dL — ABNORMAL HIGH (ref 70–99)
Glucose-Capillary: 150 mg/dL — ABNORMAL HIGH (ref 70–99)
Glucose-Capillary: 126 mg/dL — ABNORMAL HIGH (ref 70–99)
Glucose-Capillary: 126 mg/dL — ABNORMAL HIGH (ref 70–99)
Glucose-Capillary: 203 mg/dL — ABNORMAL HIGH (ref 70–99)

## 2020-01-29 LAB — CBC
HCT: 36.5 % (ref 36.0–46.0)
Hemoglobin: 12.1 g/dL (ref 12.0–15.0)
MCH: 28.4 pg (ref 26.0–34.0)
MCHC: 33.2 g/dL (ref 30.0–36.0)
MCV: 85.7 fL (ref 80.0–100.0)
Platelets: 151 10*3/uL (ref 150–400)
RBC: 4.26 MIL/uL (ref 3.87–5.11)
RDW: 13.7 % (ref 11.5–15.5)
WBC: 8.8 10*3/uL (ref 4.0–10.5)
nRBC: 0 % (ref 0.0–0.2)

## 2020-01-29 LAB — SURGICAL PCR SCREEN
MRSA, PCR: NEGATIVE
Staphylococcus aureus: NEGATIVE

## 2020-01-29 LAB — MAGNESIUM: Magnesium: 1.9 mg/dL (ref 1.7–2.4)

## 2020-01-29 SURGERY — HEMIARTHROPLASTY, HIP, DIRECT ANTERIOR APPROACH, FOR FRACTURE
Anesthesia: Spinal | Site: Hip | Laterality: Right

## 2020-01-29 MED ORDER — ENSURE ENLIVE PO LIQD
237.0000 mL | Freq: Two times a day (BID) | ORAL | Status: DC
  Administered 2020-01-30 – 2020-02-04 (×8): 237 mL via ORAL
  Filled 2020-01-29: qty 237

## 2020-01-29 MED ORDER — LIDOCAINE HCL (PF) 2 % IJ SOLN
INTRAMUSCULAR | Status: AC
  Filled 2020-01-29: qty 10

## 2020-01-29 MED ORDER — FENTANYL CITRATE (PF) 100 MCG/2ML IJ SOLN
INTRAMUSCULAR | Status: AC
  Filled 2020-01-29: qty 2

## 2020-01-29 MED ORDER — DEXAMETHASONE SODIUM PHOSPHATE 10 MG/ML IJ SOLN
INTRAMUSCULAR | Status: AC
  Filled 2020-01-29: qty 1

## 2020-01-29 MED ORDER — POLYETHYLENE GLYCOL 3350 17 G PO PACK: 17.0000 g | PACK | Freq: Every day | ORAL | Status: DC | PRN

## 2020-01-29 MED ORDER — EPINEPHRINE PF 1 MG/ML IJ SOLN
INTRAMUSCULAR | Status: AC
  Filled 2020-01-29: qty 1

## 2020-01-29 MED ORDER — HYDROCODONE-ACETAMINOPHEN 5-325 MG PO TABS
1.0000 | ORAL_TABLET | ORAL | Status: DC | PRN
  Administered 2020-02-01: 1 via ORAL
  Filled 2020-01-29: qty 1

## 2020-01-29 MED ORDER — MAGNESIUM CITRATE PO SOLN
1.0000 | Freq: Once | ORAL | Status: AC | PRN
  Administered 2020-02-04: 1 via ORAL
  Filled 2020-01-29 (×2): qty 296

## 2020-01-29 MED ORDER — ONDANSETRON HCL 4 MG PO TABS: 4.0000 mg | ORAL_TABLET | Freq: Four times a day (QID) | ORAL | Status: DC | PRN

## 2020-01-29 MED ORDER — SENNA 8.6 MG PO TABS
1.0000 | ORAL_TABLET | Freq: Two times a day (BID) | ORAL | Status: DC
  Administered 2020-01-29 – 2020-02-04 (×12): 8.6 mg via ORAL
  Filled 2020-01-29 (×12): qty 1

## 2020-01-29 MED ORDER — FENTANYL CITRATE (PF) 100 MCG/2ML IJ SOLN: 25.0000 ug | INTRAMUSCULAR | Status: DC | PRN

## 2020-01-29 MED ORDER — PHENYLEPHRINE HCL (PRESSORS) 10 MG/ML IV SOLN
INTRAVENOUS | Status: DC | PRN
  Administered 2020-01-29: 100 ug via INTRAVENOUS
  Administered 2020-01-29 (×2): 200 ug via INTRAVENOUS
  Administered 2020-01-29 (×3): 100 ug via INTRAVENOUS

## 2020-01-29 MED ORDER — HYDROCODONE-ACETAMINOPHEN 7.5-325 MG PO TABS: 1.0000 | ORAL_TABLET | ORAL | Status: DC | PRN

## 2020-01-29 MED ORDER — SODIUM CHLORIDE 0.9 % IV SOLN: INTRAVENOUS | Status: DC | PRN

## 2020-01-29 MED ORDER — ONDANSETRON HCL 4 MG/2ML IJ SOLN: 4.0000 mg | Freq: Four times a day (QID) | INTRAMUSCULAR | Status: DC | PRN

## 2020-01-29 MED ORDER — EPHEDRINE 5 MG/ML INJ
INTRAVENOUS | Status: AC
  Filled 2020-01-29: qty 10

## 2020-01-29 MED ORDER — ALUM & MAG HYDROXIDE-SIMETH 200-200-20 MG/5ML PO SUSP: 30.0000 mL | ORAL | Status: DC | PRN

## 2020-01-29 MED ORDER — CEFAZOLIN SODIUM-DEXTROSE 2-4 GM/100ML-% IV SOLN
INTRAVENOUS | Status: AC
  Filled 2020-01-29: qty 100

## 2020-01-29 MED ORDER — ONDANSETRON HCL 4 MG/2ML IJ SOLN
INTRAMUSCULAR | Status: DC | PRN
  Administered 2020-01-29: 4 mg via INTRAVENOUS

## 2020-01-29 MED ORDER — ONDANSETRON HCL 4 MG/2ML IJ SOLN: 4.0000 mg | Freq: Once | INTRAMUSCULAR | Status: DC | PRN

## 2020-01-29 MED ORDER — DEXAMETHASONE SODIUM PHOSPHATE 10 MG/ML IJ SOLN
INTRAMUSCULAR | Status: DC | PRN
  Administered 2020-01-29: 5 mg via INTRAVENOUS

## 2020-01-29 MED ORDER — TRAMADOL HCL 50 MG PO TABS
50.0000 mg | ORAL_TABLET | Freq: Four times a day (QID) | ORAL | Status: DC
  Administered 2020-01-29 – 2020-02-04 (×21): 50 mg via ORAL
  Filled 2020-01-29 (×21): qty 1

## 2020-01-29 MED ORDER — KETAMINE HCL 50 MG/ML IJ SOLN
INTRAMUSCULAR | Status: AC
  Filled 2020-01-29: qty 10

## 2020-01-29 MED ORDER — PROPOFOL 500 MG/50ML IV EMUL
INTRAVENOUS | Status: DC | PRN
  Administered 2020-01-29: 50 ug/kg/min via INTRAVENOUS

## 2020-01-29 MED ORDER — NEOMYCIN-POLYMYXIN B GU 40-200000 IR SOLN
Status: DC | PRN
  Administered 2020-01-29: 16 mL

## 2020-01-29 MED ORDER — SODIUM CHLORIDE 0.9 % IV SOLN: INTRAVENOUS | Status: DC

## 2020-01-29 MED ORDER — KETAMINE HCL 50 MG/ML IJ SOLN
INTRAMUSCULAR | Status: DC | PRN
  Administered 2020-01-29 (×2): 25 mg via INTRAMUSCULAR

## 2020-01-29 MED ORDER — CHLORHEXIDINE GLUCONATE CLOTH 2 % EX PADS
6.0000 | MEDICATED_PAD | Freq: Every day | CUTANEOUS | Status: DC
  Administered 2020-01-29: 6 via TOPICAL

## 2020-01-29 MED ORDER — BUPIVACAINE HCL (PF) 0.5 % IJ SOLN
INTRAMUSCULAR | Status: AC
  Filled 2020-01-29: qty 10

## 2020-01-29 MED ORDER — PROPOFOL 500 MG/50ML IV EMUL
INTRAVENOUS | Status: AC
  Filled 2020-01-29: qty 50

## 2020-01-29 MED ORDER — EPHEDRINE SULFATE 50 MG/ML IJ SOLN
INTRAMUSCULAR | Status: DC | PRN
  Administered 2020-01-29 (×3): 10 mg via INTRAVENOUS

## 2020-01-29 MED ORDER — ENOXAPARIN SODIUM 40 MG/0.4ML ~~LOC~~ SOLN
40.0000 mg | SUBCUTANEOUS | Status: DC
  Administered 2020-01-30 – 2020-02-04 (×6): 40 mg via SUBCUTANEOUS
  Filled 2020-01-29 (×6): qty 0.4

## 2020-01-29 MED ORDER — ONDANSETRON HCL 4 MG/2ML IJ SOLN
INTRAMUSCULAR | Status: AC
  Filled 2020-01-29: qty 2

## 2020-01-29 MED ORDER — BISACODYL 10 MG RE SUPP
10.0000 mg | Freq: Every day | RECTAL | Status: DC | PRN
  Administered 2020-02-04: 10 mg via RECTAL
  Filled 2020-01-29: qty 1

## 2020-01-29 MED ORDER — ADULT MULTIVITAMIN W/MINERALS CH
1.0000 | ORAL_TABLET | Freq: Every day | ORAL | Status: DC
  Administered 2020-01-30 – 2020-02-04 (×6): 1 via ORAL
  Filled 2020-01-29 (×7): qty 1

## 2020-01-29 MED ORDER — POTASSIUM CHLORIDE 10 MEQ/100ML IV SOLN
10.0000 meq | INTRAVENOUS | Status: AC
  Administered 2020-01-29 (×3): 10 meq via INTRAVENOUS
  Filled 2020-01-29 (×6): qty 100

## 2020-01-29 MED ORDER — GLYCOPYRROLATE 0.2 MG/ML IJ SOLN
INTRAMUSCULAR | Status: AC
  Filled 2020-01-29: qty 1

## 2020-01-29 MED ORDER — DOCUSATE SODIUM 100 MG PO CAPS
100.0000 mg | ORAL_CAPSULE | Freq: Two times a day (BID) | ORAL | Status: DC
  Administered 2020-01-29 – 2020-02-04 (×12): 100 mg via ORAL
  Filled 2020-01-29 (×12): qty 1

## 2020-01-29 MED ORDER — FENTANYL CITRATE (PF) 100 MCG/2ML IJ SOLN
INTRAMUSCULAR | Status: DC | PRN
  Administered 2020-01-29 (×2): 25 ug via INTRAVENOUS

## 2020-01-29 MED ORDER — BUPIVACAINE HCL (PF) 0.5 % IJ SOLN
INTRAMUSCULAR | Status: AC
  Filled 2020-01-29: qty 30

## 2020-01-29 MED ORDER — SODIUM CHLORIDE 0.9 % IV SOLN
INTRAVENOUS | Status: DC | PRN
  Administered 2020-01-29: 25 ug/min via INTRAVENOUS

## 2020-01-29 MED ORDER — NEOMYCIN-POLYMYXIN B GU 40-200000 IR SOLN
Status: AC
  Filled 2020-01-29: qty 20

## 2020-01-29 MED ORDER — BUPIVACAINE HCL (PF) 0.5 % IJ SOLN
INTRAMUSCULAR | Status: DC | PRN
  Administered 2020-01-29: 2.5 mL

## 2020-01-29 SURGICAL SUPPLY — 55 items
BLADE SAGITTAL WIDE XTHICK NO (BLADE) ×3 IMPLANT
BLADE SURG SZ10 CARB STEEL (BLADE) ×3 IMPLANT
BNDG COHESIVE 4X5 TAN STRL (GAUZE/BANDAGES/DRESSINGS) ×3 IMPLANT
CANISTER SUCT 1200ML W/VALVE (MISCELLANEOUS) ×3 IMPLANT
CANISTER SUCT 3000ML PPV (MISCELLANEOUS) ×6 IMPLANT
COVER BACK TABLE REUSABLE LG (DRAPES) ×3 IMPLANT
COVER WAND RF STERILE (DRAPES) ×3 IMPLANT
DRAPE 3/4 80X56 (DRAPES) ×6 IMPLANT
DRAPE INCISE IOBAN 66X60 STRL (DRAPES) ×3 IMPLANT
DRAPE SPLIT 6X30 W/TAPE (DRAPES) ×6 IMPLANT
DRAPE SURG 17X11 SM STRL (DRAPES) ×3 IMPLANT
DRSG OPSITE POSTOP 4X10 (GAUZE/BANDAGES/DRESSINGS) ×3 IMPLANT
DURAPREP 26ML APPLICATOR (WOUND CARE) ×12 IMPLANT
ELECT BLADE 6.5 EXT (BLADE) ×3 IMPLANT
ELECT CAUTERY BLADE 6.4 (BLADE) ×3 IMPLANT
ELECT REM PT RETURN 9FT ADLT (ELECTROSURGICAL) ×3
ELECTRODE REM PT RTRN 9FT ADLT (ELECTROSURGICAL) ×1 IMPLANT
GAUZE SPONGE 4X4 12PLY STRL (GAUZE/BANDAGES/DRESSINGS) ×3 IMPLANT
GAUZE XEROFORM 1X8 LF (GAUZE/BANDAGES/DRESSINGS) ×6 IMPLANT
GLOVE BIOGEL PI IND STRL 9 (GLOVE) ×1 IMPLANT
GLOVE BIOGEL PI INDICATOR 9 (GLOVE) ×2
GLOVE SURG 9.0 ORTHO LTXF (GLOVE) ×6 IMPLANT
GOWN STRL REUS TWL 2XL XL LVL4 (GOWN DISPOSABLE) ×3 IMPLANT
GOWN STRL REUS W/ TWL LRG LVL3 (GOWN DISPOSABLE) ×1 IMPLANT
GOWN STRL REUS W/TWL LRG LVL3 (GOWN DISPOSABLE) ×2
HEAD MODULAR ENDO (Orthopedic Implant) ×2 IMPLANT
HEAD UNPLR 49XMDLR STRL HIP (Orthopedic Implant) ×1 IMPLANT
HEMOVAC 400ML (MISCELLANEOUS) ×3
KIT DRAIN HEMOVAC JP 7FR 400ML (MISCELLANEOUS) ×1 IMPLANT
KIT TURNOVER KIT A (KITS) ×3 IMPLANT
NDL SAFETY ECLIPSE 18X1.5 (NEEDLE) ×1 IMPLANT
NEEDLE FILTER BLUNT 18X 1/2SAF (NEEDLE) ×2
NEEDLE FILTER BLUNT 18X1 1/2 (NEEDLE) ×1 IMPLANT
NEEDLE HYPO 18GX1.5 SHARP (NEEDLE) ×2
NEEDLE MAYO CATGUT SZ4 (NEEDLE) ×3 IMPLANT
NS IRRIG 1000ML POUR BTL (IV SOLUTION) ×3 IMPLANT
PACK HIP PROSTHESIS (MISCELLANEOUS) ×3 IMPLANT
PILLOW ABDUCTION FOAM SM (MISCELLANEOUS) ×3 IMPLANT
PULSAVAC PLUS IRRIG FAN TIP (DISPOSABLE) ×3
RETRIEVER SUT HEWSON (MISCELLANEOUS) ×3 IMPLANT
SLEEVE UNITRAX V40 (Orthopedic Implant) ×2 IMPLANT
SLEEVE UNITRAX V40 +4 (Orthopedic Implant) ×1 IMPLANT
SOL .9 NS 3000ML IRR  AL (IV SOLUTION) ×2
SOL .9 NS 3000ML IRR UROMATIC (IV SOLUTION) ×1 IMPLANT
STAPLER SKIN PROX 35W (STAPLE) ×3 IMPLANT
STEM FEM ACCOLADE 38X102X30 S3 (Stem) ×3 IMPLANT
SUT TICRON 2-0 30IN 311381 (SUTURE) ×12 IMPLANT
SUT VIC AB 0 CT1 36 (SUTURE) ×3 IMPLANT
SUT VIC AB 2-0 CT2 27 (SUTURE) ×6 IMPLANT
SYR 10ML LL (SYRINGE) ×3 IMPLANT
TAPE MICROFOAM 4IN (TAPE) ×3 IMPLANT
TAPE TRANSPORE STRL 2 31045 (GAUZE/BANDAGES/DRESSINGS) ×3 IMPLANT
TIP BRUSH PULSAVAC PLUS 24.33 (MISCELLANEOUS) ×3 IMPLANT
TIP FAN IRRIG PULSAVAC PLUS (DISPOSABLE) ×1 IMPLANT
TUBE SUCT KAM VAC (TUBING) IMPLANT

## 2020-01-29 NOTE — ED Notes (Signed)
Daughter must be called in AM to verbally consent to surgery

## 2020-01-29 NOTE — Anesthesia Preprocedure Evaluation (Signed)
Anesthesia Evaluation  Patient identified by MRN, date of birth, ID band Patient awake    Reviewed: Allergy & Precautions, H&P , NPO status , Patient's Chart, lab work & pertinent test results, reviewed documented beta blocker date and time   Airway Mallampati: II  TM Distance: >3 FB Neck ROM: full    Dental  (+) Teeth Intact   Pulmonary neg pulmonary ROS,    Pulmonary exam normal        Cardiovascular Exercise Tolerance: Poor hypertension, On Medications negative cardio ROS Normal cardiovascular exam Rhythm:regular Rate:Normal     Neuro/Psych Seizures -, Well Controlled,  PSYCHIATRIC DISORDERS Dementia    GI/Hepatic negative GI ROS, Neg liver ROS,   Endo/Other  negative endocrine ROSdiabetes  Renal/GU Renal disease  negative genitourinary   Musculoskeletal   Abdominal   Peds  Hematology negative hematology ROS (+)   Anesthesia Other Findings Past Medical History: No date: Diabetes mellitus without complication (HCC) No date: Hypertension Past Surgical History: No date: THYROIDECTOMY BMI    Body Mass Index: 21.26 kg/m     Reproductive/Obstetrics negative OB ROS                             Anesthesia Physical Anesthesia Plan  ASA: III  Anesthesia Plan: General ETT   Post-op Pain Management:    Induction:   PONV Risk Score and Plan: 4 or greater  Airway Management Planned:   Additional Equipment:   Intra-op Plan:   Post-operative Plan:   Informed Consent: I have reviewed the patients History and Physical, chart, labs and discussed the procedure including the risks, benefits and alternatives for the proposed anesthesia with the patient or authorized representative who has indicated his/her understanding and acceptance.     Dental Advisory Given  Plan Discussed with: CRNA  Anesthesia Plan Comments:         Anesthesia Quick Evaluation

## 2020-01-29 NOTE — Progress Notes (Signed)
Initial Nutrition Assessment  DOCUMENTATION CODES:   Not applicable  INTERVENTION:   Ensure Enlive po BID, each supplement provides 350 kcal and 20 grams of protein  Magic cup TID with meals, each supplement provides 290 kcal and 9 grams of protein  MVI daily  Recommend Oscal w/ D po BID  Recommend dysphagia 3 diet once advanced   NUTRITION DIAGNOSIS:   Increased nutrient needs related to hip fracture as evidenced by increased estimated needs.  GOAL:   Patient will meet greater than or equal to 90% of their needs  MONITOR:   PO intake, Supplement acceptance, Labs, Weight trends, Skin, I & O's  REASON FOR ASSESSMENT:   Consult Hip fracture protocol  ASSESSMENT:   86 y.o. female with medical history significant of dementia, hypertension, diabetes mellitus, depression, one-time seizure, who presents with fall and right hip fracture   Unable to to see patient as pt in procedure at time of RD visit. RD suspects pt with poor appetite and oral intake at baseline r/t dementia. Pt currently NPO for right hip hemiarthroplasty today. RD will add supplements and vitamins to help pt meet her estimated needs and to support post op healing. Per chart, pt down 12lbs(9%) over the past 10 months; RD unsure how recently weight loss occurred. RD will obtain nutrition related exam at follow-up. Pt is not likely able to provide much nutrition related history r/t dementia.   Pt is at high risk for malnutrition   Medications reviewed and include: insulin, remeron, ceftriaxone   Labs reviewed: K 3.2(L), Mg 1.9 wnl cbgs- 237, 167, 150 x 24 hrs AIC 11.4(H)- 03/2019  NUTRITION - FOCUSED PHYSICAL EXAM: Unable to complete at this time   Diet Order:   Diet Order            Diet NPO time specified Except for: Ice Chips, Sips with Meds  Diet effective now             EDUCATION NEEDS:   Not appropriate for education at this time  Skin:  Skin Assessment: Reviewed RN Assessment  Last  BM:  pta  Height:   Ht Readings from Last 1 Encounters:  01/28/20 5\' 3"  (1.6 m)    Weight:   Wt Readings from Last 1 Encounters:  01/28/20 54.4 kg    Ideal Body Weight:  52.3 kg  BMI:  Body mass index is 21.26 kg/m.  Estimated Nutritional Needs:   Kcal:  1400-1600kcal/day  Protein:  70-80g/day  Fluid:  >1.4L/day  01/30/20 MS, RD, LDN Please refer to [facility] for RD and/or RD on-call/weekend/after hours pager

## 2020-01-29 NOTE — Op Note (Signed)
01/29/2020  3:36 PM  PATIENT:  Melanie Turner   MRN: [id number]  PRE-OPERATIVE DIAGNOSIS:  Right displaced femoral neck hip fracture  POST-OPERATIVE DIAGNOSIS:  Right displaced femoral neckhip fracture  PROCEDURE:  Procedure(s): RIGHT HIP HEMIARTHROPLASTY   PREOPERATIVE INDICATIONS:    Melanie Turner is an 86 y.o. female who was admitted with a diagnosis of Right hip fracture.  I have recommended surgical fixation with hemiarthroplasty for this injury. I have explained the surgery and the postoperative course to the patient and their daughter who have agreed with surgical management of this fracture.    The risks benefits and alternatives were discussed with the patient and their family including but not limited to the risks of  infection requiring removal of the prosthesis, bleeding requiring blood transfusion, nerve injury especially to the sciatic nerve leading to foot drop or lower extremity numbness, periprosthetic fracture, dislocation leg length discrepancy, change in lower extremity rotation persistent hip pain, loosening or failure of the components and the need for revision surgery. Medical risks include but are not limited to DVT and pulmonary embolism, myocardial infarction, stroke, pneumonia, respiratory failure and death.  OPERATIVE REPORT     SURGEON:  Thornton Park, MD    ASSISTANT:  Surgical tech    ANESTHESIA:  spinal    COMPLICATIONS:  None.   SPECIMEN: Femoral head to pathology    COMPONENTS:  Stryker Accolade 2 femoral component size 3 with a 49 mm +4 neck adjustment sleeve.    PROCEDURE IN DETAIL:   The patient was met in the holding area and  identified.  The appropriate hip was identified and marked at the operative site after verbally confirming with the patient that this was the correct site of surgery.  The patient was then transported to the OR  and  underwent spinal anesthesia.  The patient was then placed in the lateral decubitus position with the  operative side up and secured on the operating room table with a pegboard and all bony prominences were adequately padded. This included an axillary roll and additional padding around the nonoperative leg to prevent compression to the common peroneal nerve.    The operative lower extremity was prepped and draped in a sterile fashion.  A time out was performed prior to incision to verify patient's name, date of birth, medical record number, correct site of surgery correct procedure to be performed. The timeout was also used to verify the patient received antibiotics now appropriate instruments, implants and radiographic studies were available in the room. Once all in attendance were in agreement case began.    A posterolateral approach was utilized via sharp dissection  carried down to the subcutaneous tissue.  Bleeding vessels were coagulated using electrocautery.  The fascia lata was identified and incised along the length of the skin incision.  The gluteus maximus muscle was then split in line with its fibers. Self-retaining retractors were  inserted.  With the hip internally rotated, the short external rotators  were identified and removed from the posterior attachment from the greater trochanter. The piriformis was tagged for later repair. The capsule was identified and a T-shaped capsulotomy was performed. The capsule was tagged with #2 Tycron for later repair.  The femoral neck fracture was exposed, and the femoral head was removed using a corkscrew device. This was measured to be 49 mm in diameter. The attention was then turned to proximal femur preparation.  An oscillating saw was used to perform a proximal femoral osteotomy 1 fingerbreadth above the  lesser trochanter. The trial 49 mm femoral head was placed into the acetabulum and had an excellent suction fit. The attention was then turned back to femoral preparation.    A femoral skid and Cobra retractor were placed under the femoral neck to allow  for adequate visualization. A box osteotome was used to make the initial entry into the proximal femur. A single hand reamer was used to prepare the femoral canal. A T-shaped femoral canal sounder was then used to ensure no penetration femoral cortex had occurred during reaming. The proximal femur was then sequentially broached by hand. A size 3 femoral trial broach was found to have best medial to lateral canal fit. Once adequate mediolateral canal fill was achieved the trial femoral broach, neck, and head was assembled and the hip was reduced. It was found to have excellent stability, equivalent leg lengths with functional range of motion. The trial components were then removed.  I copiously irrigated the femoral canal and then impacted the real femoral prosthesis into place into the appropriate version, slightly anteverted to the normal anatomy, and I impacted the actual 49 mm Unitrax femoral component with a +4 neck adjustment sleeve into place. The hip was then reduced and taken through functional range of motion and found to have excellent stability. Leg lengths were restored. The hip joint was copiously irrigated.   A soft tissue repair of the capsule and external rotators was performed using #2 Tycron.  Excellent posterior capsular repair was achieved through bone tunnels through the greater trochanter.  A soft tissue repair of the piriformis was also performed. The fascia lata was then closed with interrupted 0 Vicryl suture. The subcutaneous tissues were closed with 2-0 Vicryl and the skin approximated with staples.   The patient was then placed supine on the operative table. Leg lengths were checked clinically and found to be equivalent. An abduction pillow was placed between the lower extremities. The patient was then transferred to a hospital bed and brought to the PACU in stable condition. I was scrubbed and present the entire case and all sharp and instrument counts were correct at the  conclusion of the case. I spoke with the patient's son in the postop consultation room to let him know the case was completed without complication patient was stable in recovery room.   Timoteo Gaul, MD Orthopedic Surgeon

## 2020-01-29 NOTE — Anesthesia Procedure Notes (Signed)
Spinal  Start time: 01/29/2020 1:39 PM End time: 01/29/2020 1:42 PM Staffing Anesthesiologist: Yevette Edwards, MD Resident/CRNA: Irving Burton, CRNA Preanesthetic Checklist Completed: patient identified, IV checked, site marked, risks and benefits discussed, surgical consent, monitors and equipment checked and pre-op evaluation Spinal Block Patient position: sitting Prep: ChloraPrep and site prepped and draped Patient monitoring: continuous pulse ox, blood pressure and heart rate Approach: midline Location: L3-4 Injection technique: single-shot Needle Needle type: Pencan  Needle gauge: 24 G Needle length: 9 cm

## 2020-01-29 NOTE — Transfer of Care (Signed)
Immediate Anesthesia Transfer of Care Note  Patient: Melanie Turner  Procedure(s) Performed: ARTHROPLASTY BIPOLAR HIP (HEMIARTHROPLASTY) (Right Hip)  Patient Location: PACU  Anesthesia Type:Spinal  Level of Consciousness: awake  Airway & Oxygen Therapy: Patient connected to face mask oxygen  Post-op Assessment: Post -op Vital signs reviewed and stable  Post vital signs: stable  Last Vitals:  Vitals Value Taken Time  BP 124/77 01/29/20 1530  Temp 36.6 C 01/29/20 1530  Pulse 89 01/29/20 1531  Resp 14 01/29/20 1531  SpO2 100 % 01/29/20 1531  Vitals shown include unvalidated device data.  Last Pain:  Vitals:   01/29/20 1308  TempSrc: Tympanic  PainSc: 4          Complications: No apparent anesthesia complications

## 2020-01-29 NOTE — Progress Notes (Signed)
Subjective:  POST-OP CHECK:  Patient denies pain.  Spinal block seems to be working.  Her son is at the bedside.   Objective:   VITALS:   Vitals:   01/29/20 1531 01/29/20 1546 01/29/20 1600 01/29/20 1649  BP:  121/79 123/70 121/75  Pulse:  89 98 85  Resp: (!) 8 (!) 21 15 18   Temp:   98 F (36.7 C) (!) 97.5 F (36.4 C)  TempSrc:    Oral  SpO2:  100% 100% 100%  Weight:      Height:        PHYSICAL EXAM: Right lower extremity:  Spinal block still working.  Patient can dorsiflex and plantarflex either ankle yet.   Patient still will decreased sensation to light touch. Intact pulses distally Incision: dressing C/D/I No cellulitis present Compartment soft  LABS  Results for orders placed or performed during the hospital encounter of 01/28/20 (from the past 24 hour(s))  Glucose, capillary     Status: Abnormal   Collection Time: 01/28/20  9:55 PM  Result Value Ref Range   Glucose-Capillary 237 (H) 70 - 99 mg/dL   Comment 1 Document in Chart   Protime-INR     Status: Abnormal   Collection Time: 01/28/20  9:57 PM  Result Value Ref Range   Prothrombin Time 15.4 (H) 11.4 - 15.2 seconds   INR 1.2 0.8 - 1.2  APTT     Status: None   Collection Time: 01/28/20  9:57 PM  Result Value Ref Range   aPTT 28 24 - 36 seconds  Type and screen     Status: None   Collection Time: 01/28/20  9:57 PM  Result Value Ref Range   ABO/RH(D) O POS    Antibody Screen NEG    Sample Expiration      01/31/2020,2359 Performed at [facility], [address], [place], [place] [address]   Surgical pcr screen     Status: None   Collection Time: 01/29/20  3:35 AM   Specimen: Nasal Mucosa; Nasal Swab  Result Value Ref Range   MRSA, PCR NEGATIVE NEGATIVE   Staphylococcus aureus NEGATIVE NEGATIVE  Magnesium     Status: None   Collection Time: 01/29/20  4:45 AM  Result Value Ref Range   Magnesium 1.9 1.7 - 2.4 mg/dL  CBC     Status: None   Collection Time: 01/29/20  4:45 AM  Result Value  Ref Range   WBC 8.8 4.0 - 10.5 K/uL   RBC 4.26 3.87 - 5.11 MIL/uL   Hemoglobin 12.1 12.0 - 15.0 g/dL   HCT 36.5 36.0 - 46.0 %   MCV 85.7 80.0 - 100.0 fL   MCH 28.4 26.0 - 34.0 pg   MCHC 33.2 30.0 - 36.0 g/dL   RDW 13.7 11.5 - 15.5 %   Platelets 151 150 - 400 K/uL   nRBC 0.0 0.0 - 0.2 %  Basic metabolic panel     Status: Abnormal   Collection Time: 01/29/20  4:45 AM  Result Value Ref Range   Sodium 140 135 - 145 mmol/L   Potassium 3.2 (L) 3.5 - 5.1 mmol/L   Chloride 104 98 - 111 mmol/L   CO2 26 22 - 32 mmol/L   Glucose, Bld 184 (H) 70 - 99 mg/dL   BUN 16 8 - 23 mg/dL   Creatinine, Ser 0.64 0.44 - 1.00 mg/dL   Calcium 9.1 8.9 - 10.3 mg/dL   GFR calc non Af Amer >60 >60 mL/min  GFR calc Af Amer >60 >60 mL/min   Anion gap 10 5 - 15  Glucose, capillary     Status: Abnormal   Collection Time: 01/29/20  8:01 AM  Result Value Ref Range   Glucose-Capillary 167 (H) 70 - 99 mg/dL   Comment 1 Notify RN   Glucose, capillary     Status: Abnormal   Collection Time: 01/29/20 12:13 PM  Result Value Ref Range   Glucose-Capillary 150 (H) 70 - 99 mg/dL   Comment 1 Notify RN   Glucose, capillary     Status: Abnormal   Collection Time: 01/29/20  4:13 PM  Result Value Ref Range   Glucose-Capillary 126 (H) 70 - 99 mg/dL  Glucose, capillary     Status: Abnormal   Collection Time: 01/29/20  4:39 PM  Result Value Ref Range   Glucose-Capillary 126 (H) 70 - 99 mg/dL   Comment 1 Notify RN     CT HEAD WO CONTRAST  Result Date: 01/28/2020 CLINICAL DATA:  Trauma, headache EXAM: CT HEAD WITHOUT CONTRAST TECHNIQUE: Contiguous axial images were obtained from the base of the skull through the vertex without intravenous contrast. COMPARISON:  03/08/2019 FINDINGS: Brain: There is no acute intracranial hemorrhage, mass effect, or edema. Gray-white differentiation is preserved. There is no extra-axial fluid collection. Patchy hypoattenuation in the supratentorial white matter is nonspecific but probably  reflects stable chronic microvascular ischemic changes. Prominence of the ventricles and sulci reflects stable parenchymal volume loss. There is disproportionate medial temporal lobe volume loss with ex vacuo dilatation of the temporal horns. Vascular: No hyperdense vessel or unexpected calcification. Skull: Calvarium is unremarkable. Sinuses/Orbits: No acute finding. Other: None. IMPRESSION: No acute intracranial hemorrhage or mass effect. Stable chronic findings detailed above. Electronically Signed   By: Guadlupe Spanish M.D.   On: 01/28/2020 17:07   DG Chest Portable 1 View  Result Date: 01/28/2020 CLINICAL DATA:  Pain following fall EXAM: PORTABLE CHEST 1 VIEW COMPARISON:  Mar 10, 2019. FINDINGS: There is slight scarring in each upper lobe. Lungs elsewhere clear. Heart size and pulmonary vascularity are normal. There is aortic atherosclerosis. No pneumothorax. No bone lesions. IMPRESSION: Mild upper lobe scarring. No edema or consolidation. Heart size normal. Aortic Atherosclerosis (ICD10-I70.0). Electronically Signed   By: Bretta Bang III M.D.   On: 01/28/2020 13:05   DG Hip Port Unilat With Pelvis 1V Right  Result Date: 01/29/2020 CLINICAL DATA:  Right hip replacement. EXAM: DG HIP (WITH OR WITHOUT PELVIS) 1V PORT RIGHT COMPARISON:  Right hip x-ray from yesterday. FINDINGS: The right hip demonstrates a hemiarthroplasty without evidence of hardware failure or complication. There is no fracture or dislocation. The alignment is anatomic. Post-surgical changes noted in the surrounding soft tissues. IMPRESSION: Interval right hip hemiarthroplasty without acute postoperative complication. Electronically Signed   By: Obie Dredge M.D.   On: 01/29/2020 16:16   DG Hip Unilat W or Wo Pelvis 2-3 Views Right  Result Date: 01/28/2020 CLINICAL DATA:  Pain following fall EXAM: DG HIP (WITH OR WITHOUT PELVIS) 2-3V RIGHT COMPARISON:  None. FINDINGS: Frontal pelvis as well as frontal and lateral views  obtained. There is a subcapital femoral neck fracture with mild impaction at the fracture site. There is Verus angulation at the fracture site. No other fracture. No dislocation. Bones are osteoporotic. There is moderate narrowing of each hip joint. IMPRESSION: Subcapital femoral neck fracture on the right with impaction and varus angulation at fracture site. No other fracture. No dislocation. Bones osteoporotic. Moderate symmetric narrowing noted  in each hip joint. Electronically Signed   By: Bretta Bang III M.D.   On: 01/28/2020 13:04    Assessment/Plan: Day of Surgery   Principal Problem:   Closed right hip fracture (HCC) Active Problems:   Type II diabetes mellitus with renal manifestations (HCC)   Benign essential HTN   UTI (urinary tract infection)   Dementia (HCC)   Hypokalemia   Fall at home, initial encounter  Patient stable post-op.   Continue rocephin. Foley will be d/c'd in the AM. Check labs in the AM.   Being PT tomorrow.   Lovenox to start tomorrow for DVT prophylaxis.    Juanell Fairly , MD 01/29/2020, 4:53 PM

## 2020-01-29 NOTE — Progress Notes (Signed)
Subjective:  Patient seen in the preoperative area with her son at the bedside.  Patient denies any significant right hip pain currently.  Objective:   VITALS:   Vitals:   01/29/20 0539 01/29/20 0740 01/29/20 1223 01/29/20 1308  BP: (!) 183/85 (!) 158/86 (!) 165/86 (!) 149/98  Pulse: 71 93 90 84  Resp:  18 18 18   Temp: 98 F (36.7 C) 98.3 F (36.8 C) 98.9 F (37.2 C) (!) 97.1 F (36.2 C)  TempSrc: Oral Oral Oral Tympanic  SpO2: 100% 100% 100% 99%  Weight:      Height:        PHYSICAL EXAM: Right lower extremity: Neurovascular intact Sensation intact distally Intact pulses distally Dorsiflexion/Plantar flexion intact No cellulitis present Compartment soft  LABS  Results for orders placed or performed during the hospital encounter of 01/28/20 (from the past 24 hour(s))  Respiratory Panel by RT PCR (Flu A&B, Covid) - Nasopharyngeal Swab     Status: None   Collection Time: 01/28/20  1:42 PM   Specimen: Nasopharyngeal Swab  Result Value Ref Range   SARS Coronavirus 2 by RT PCR NEGATIVE NEGATIVE   Influenza A by PCR NEGATIVE NEGATIVE   Influenza B by PCR NEGATIVE NEGATIVE  Protime-INR     Status: Abnormal   Collection Time: 01/28/20  4:07 PM  Result Value Ref Range   Prothrombin Time 15.7 (H) 11.4 - 15.2 seconds   INR 1.3 (H) 0.8 - 1.2  APTT     Status: None   Collection Time: 01/28/20  4:07 PM  Result Value Ref Range   aPTT 28 24 - 36 seconds  Glucose, capillary     Status: Abnormal   Collection Time: 01/28/20  9:55 PM  Result Value Ref Range   Glucose-Capillary 237 (H) 70 - 99 mg/dL   Comment 1 Document in Chart   Protime-INR     Status: Abnormal   Collection Time: 01/28/20  9:57 PM  Result Value Ref Range   Prothrombin Time 15.4 (H) 11.4 - 15.2 seconds   INR 1.2 0.8 - 1.2  APTT     Status: None   Collection Time: 01/28/20  9:57 PM  Result Value Ref Range   aPTT 28 24 - 36 seconds  Type and screen     Status: None   Collection Time: 01/28/20  9:57 PM   Result Value Ref Range   ABO/RH(D) O POS    Antibody Screen NEG    Sample Expiration      01/31/2020,2359 Performed at [facility], [address], [place], [place]   Surgical pcr screen     Status: None   Collection Time: 01/29/20  3:35 AM   Specimen: Nasal Mucosa; Nasal Swab  Result Value Ref Range   MRSA, PCR NEGATIVE NEGATIVE   Staphylococcus aureus NEGATIVE NEGATIVE  Magnesium     Status: None   Collection Time: 01/29/20  4:45 AM  Result Value Ref Range   Magnesium 1.9 1.7 - 2.4 mg/dL  CBC     Status: None   Collection Time: 01/29/20  4:45 AM  Result Value Ref Range   WBC 8.8 4.0 - 10.5 K/uL   RBC 4.26 3.87 - 5.11 MIL/uL   Hemoglobin 12.1 12.0 - 15.0 g/dL   HCT 01/31/20 59.5 - 63.8 %   MCV 85.7 80.0 - 100.0 fL   MCH 28.4 26.0 - 34.0 pg   MCHC 33.2 30.0 - 36.0 g/dL   RDW 75.6 43.3 - 29.5 %  Platelets 151 150 - 400 K/uL   nRBC 0.0 0.0 - 0.2 %  Basic metabolic panel     Status: Abnormal   Collection Time: 01/29/20  4:45 AM  Result Value Ref Range   Sodium 140 135 - 145 mmol/L   Potassium 3.2 (L) 3.5 - 5.1 mmol/L   Chloride 104 98 - 111 mmol/L   CO2 26 22 - 32 mmol/L   Glucose, Bld 184 (H) 70 - 99 mg/dL   BUN 16 8 - 23 mg/dL   Creatinine, Ser 0.64 0.44 - 1.00 mg/dL   Calcium 9.1 8.9 - 10.3 mg/dL   GFR calc non Af Amer >60 >60 mL/min   GFR calc Af Amer >60 >60 mL/min   Anion gap 10 5 - 15  Glucose, capillary     Status: Abnormal   Collection Time: 01/29/20  8:01 AM  Result Value Ref Range   Glucose-Capillary 167 (H) 70 - 99 mg/dL   Comment 1 Notify RN   Glucose, capillary     Status: Abnormal   Collection Time: 01/29/20 12:13 PM  Result Value Ref Range   Glucose-Capillary 150 (H) 70 - 99 mg/dL   Comment 1 Notify RN     CT HEAD WO CONTRAST  Result Date: 01/28/2020 CLINICAL DATA:  Trauma, headache EXAM: CT HEAD WITHOUT CONTRAST TECHNIQUE: Contiguous axial images were obtained from the base of the skull through the vertex without intravenous  contrast. COMPARISON:  03/08/2019 FINDINGS: Brain: There is no acute intracranial hemorrhage, mass effect, or edema. Gray-white differentiation is preserved. There is no extra-axial fluid collection. Patchy hypoattenuation in the supratentorial white matter is nonspecific but probably reflects stable chronic microvascular ischemic changes. Prominence of the ventricles and sulci reflects stable parenchymal volume loss. There is disproportionate medial temporal lobe volume loss with ex vacuo dilatation of the temporal horns. Vascular: No hyperdense vessel or unexpected calcification. Skull: Calvarium is unremarkable. Sinuses/Orbits: No acute finding. Other: None. IMPRESSION: No acute intracranial hemorrhage or mass effect. Stable chronic findings detailed above. Electronically Signed   By: Macy Mis M.D.   On: 01/28/2020 17:07   DG Chest Portable 1 View  Result Date: 01/28/2020 CLINICAL DATA:  Pain following fall EXAM: PORTABLE CHEST 1 VIEW COMPARISON:  Mar 10, 2019. FINDINGS: There is slight scarring in each upper lobe. Lungs elsewhere clear. Heart size and pulmonary vascularity are normal. There is aortic atherosclerosis. No pneumothorax. No bone lesions. IMPRESSION: Mild upper lobe scarring. No edema or consolidation. Heart size normal. Aortic Atherosclerosis (ICD10-I70.0). Electronically Signed   By: Lowella Grip III M.D.   On: 01/28/2020 13:05   DG Hip Unilat W or Wo Pelvis 2-3 Views Right  Result Date: 01/28/2020 CLINICAL DATA:  Pain following fall EXAM: DG HIP (WITH OR WITHOUT PELVIS) 2-3V RIGHT COMPARISON:  None. FINDINGS: Frontal pelvis as well as frontal and lateral views obtained. There is a subcapital femoral neck fracture with mild impaction at the fracture site. There is Verus angulation at the fracture site. No other fracture. No dislocation. Bones are osteoporotic. There is moderate narrowing of each hip joint. IMPRESSION: Subcapital femoral neck fracture on the right with impaction  and varus angulation at fracture site. No other fracture. No dislocation. Bones osteoporotic. Moderate symmetric narrowing noted in each hip joint. Electronically Signed   By: Lowella Grip III M.D.   On: 01/28/2020 13:04    Assessment/Plan: Day of Surgery   Principal Problem:   Closed right hip fracture [facility]) Active Problems:   Type II diabetes  mellitus with renal manifestations (HCC)   Benign essential HTN   UTI (urinary tract infection)   Dementia (HCC)   Hypokalemia   Fall at home, initial encounter  Patient's right hip marked according the hospital's correct site of surgery protocol.  Plan is for right hip hemiarthroplasty today.  Patient has been cleared medically.  I have reviewed the surgical plan with the patient's son.  Answered all questions by the patient and her son.    Juanell Fairly , MD 01/29/2020, 1:24 PM

## 2020-01-29 NOTE — Progress Notes (Signed)
PROGRESS NOTE    Melanie Turner  KDX:[id number]  DOB: 1932/05/27  DOA: 01/28/2020 PCP: Kirk Ruths, MD Outpatient Specialists:   Hospital course:  Melanie Turner is a 86 y.o. female with medical history significant of dementia, hypertension, diabetes mellitus, depression, one-time seizure, who was admitted 01/27/1930 with right hip fracture status post mechanical fall although work-up does reveal UTI.  X-ray reveals subcapital femoral neck fracture with impaction and varus angulation.  Subjective:  Patient states she is feeling all right.  She when I reminded her she had a hip fracture she said "oh yeah that is right".  Denies any pain.   Objective: Vitals:   01/29/20 1600 01/29/20 1649 01/29/20 1711 01/29/20 1819  BP: 123/70 121/75 (!) 127/92 137/90  Pulse: 98 85 88 89  Resp: 15 18 18 18   Temp: 98 F (36.7 C) (!) 97.5 F (36.4 C) 97.9 F (36.6 C) 98 F (36.7 C)  TempSrc:  Oral Oral Oral  SpO2: 100% 100% 100% 99%  Weight:      Height:        Intake/Output Summary (Last 24 hours) at 01/29/2020 1828 Last data filed at 01/29/2020 1610 Gross per 24 hour  Intake 726.67 ml  Output 450 ml  Net 276.67 ml   Filed Weights   01/28/20 1134  Weight: 54.4 kg     Assessment & Plan:   Closed right hip fracture Patient being followed by orthopedics for surgery in the morning. Pain management and DVT prophylaxis per orthopedics. PT OT per orthopedics.  UTI (urinary tract infection) Day #2 of Rocephin Urine culture pending  Hypokalemia Continued low despite oral repletion yesterday. We will provide KCl IV x3 and recheck in the morning.  Type II diabetes mellitus with renal manifestations  Most recent A1c 11.4, poorly controled.  SSI AC at bedtime Hold Metformin   HTN Poorly controlled blood pressure, apparently not on any BP meds at home Amlodipine 2.5 was started.  Dementia  Continue donepezil   DVT prophylaxis: Lovenox Code Status: DNR Family  Communication: Attempted to contact patient's son, will call again Disposition Plan:   Patient is from: Home  Anticipated Discharge Location: Rehab  Barriers to Discharge: Status post surgery this morning  Is patient medically stable for Discharge: No   Consultants:  Orthopedics  Procedures:  Right hemiarthroplasty of hip  Antimicrobials:  Rocephin   Exam:  General: Pleasantly demented thin female lying flat in bed in no acute distress Eyes: sclera anicteric, conjuctiva mild injection bilaterally CVS: S1-S2, regular  Respiratory:  decreased air entry bilaterally secondary to decreased inspiratory effort, rales at bases  GI: NABS, soft, NT  LE: No edema.    Data Reviewed: Basic Metabolic Panel: Recent Labs  Lab 01/28/20 1157 01/29/20 0445  NA 137 140  K 3.3* 3.2*  CL 98 104  CO2 26 26  GLUCOSE 299* 184*  BUN 20 16  CREATININE 0.94 0.64  CALCIUM 9.7 9.1  MG  --  1.9   Liver Function Tests: Recent Labs  Lab 01/28/20 1157  AST 37  ALT 23  ALKPHOS 46  BILITOT 1.6*  PROT 7.9  ALBUMIN 4.1   No results for input(s): LIPASE, AMYLASE in the last 168 hours. No results for input(s): AMMONIA in the last 168 hours. CBC: Recent Labs  Lab 01/28/20 1157 01/29/20 0445  WBC 8.4 8.8  HGB 12.1 12.1  HCT 36.9 36.5  MCV 87.0 85.7  PLT 178 151   Cardiac Enzymes: No results for input(s): CKTOTAL,  CKMB, CKMBINDEX, TROPONINI in the last 168 hours. BNP (last 3 results) No results for input(s): PROBNP in the last 8760 hours. CBG: Recent Labs  Lab 01/28/20 2155 01/29/20 0801 01/29/20 1213 01/29/20 1613 01/29/20 1639  GLUCAP 237* 167* 150* 126* 126*    Recent Results (from the past 240 hour(s))  Urine Culture     Status: None   Collection Time: 01/28/20  1:13 PM   Specimen: Urine, Random  Result Value Ref Range Status   Specimen Description   Final    URINE, RANDOM Performed at [facility], [address] [place], [place] [address]     Special Requests   Final    NONE Performed at [facility], [address] [place], [place] [address]    Culture   Final    NO GROWTH Performed at [facility], [address], [place], [place] [address]    Report Status 01/29/2020 FINAL  Final  Respiratory Panel by RT PCR (Flu A&B, Covid) - Nasopharyngeal Swab     Status: None   Collection Time: 01/28/20  1:42 PM   Specimen: Nasopharyngeal Swab  Result Value Ref Range Status   SARS Coronavirus 2 by RT PCR NEGATIVE NEGATIVE Final    Comment: (NOTE) SARS-CoV-2 target nucleic acids are NOT DETECTED. The SARS-CoV-2 RNA is generally detectable in upper respiratoy specimens during the acute phase of infection. The lowest concentration of SARS-CoV-2 viral copies this assay can detect is 131 copies/mL. A negative result does not preclude SARS-Cov-2 infection and should not be used as the sole basis for treatment or other patient management decisions. A negative result may occur with  improper specimen collection/handling, submission of specimen other than nasopharyngeal swab, presence of viral mutation(s) within the areas targeted by this assay, and inadequate number of viral copies (<131 copies/mL). A negative result must be combined with clinical observations, patient history, and epidemiological information. The expected result is Negative. Fact Sheet for Patients:  https://www.moore.com/ Fact Sheet for Healthcare Providers:  https://www.young.biz/ This test is not yet ap proved or cleared by the [place] FDA and  has been authorized for detection and/or diagnosis of SARS-CoV-2 by FDA under an Emergency Use Authorization (EUA). This EUA will remain  in effect (meaning this test can be used) for the duration of the COVID-19 declaration under Section 564(b)(1) of the Act, 21 U.S.C. section 360bbb-3(b)(1), unless the authorization is terminated or revoked sooner.     Influenza A by PCR NEGATIVE NEGATIVE Final   Influenza B by PCR NEGATIVE NEGATIVE Final    Comment: (NOTE) The Xpert Xpress SARS-CoV-2/FLU/RSV assay is intended as an aid in  the diagnosis of influenza from Nasopharyngeal swab specimens and  should not be used as a sole basis for treatment. Nasal washings and  aspirates are unacceptable for Xpert Xpress SARS-CoV-2/FLU/RSV  testing. Fact Sheet for Patients: https://www.moore.com/ Fact Sheet for Healthcare Providers: https://www.young.biz/ This test is not yet approved or cleared by the [place] FDA and  has been authorized for detection and/or diagnosis of SARS-CoV-2 by  FDA under an Emergency Use Authorization (EUA). This EUA will remain  in effect (meaning this test can be used) for the duration of the  Covid-19 declaration under Section 564(b)(1) of the Act, 21  U.S.C. section 360bbb-3(b)(1), unless the authorization is  terminated or revoked. Performed at [facility], [address] [place], [place] [address]   Surgical pcr screen     Status: None   Collection Time: 01/29/20  3:35 AM   Specimen: Nasal Mucosa; Nasal Swab  Result Value Ref Range Status   MRSA, PCR NEGATIVE NEGATIVE Final   Staphylococcus aureus NEGATIVE NEGATIVE Final    Comment: (NOTE) The Xpert SA Assay (FDA approved for NASAL specimens in patients 13 years of age and older), is one component of a comprehensive surveillance program. It is not intended to diagnose infection nor to guide or monitor treatment. Performed at [facility], [address] [place], [place] [address]       Studies: CT HEAD WO CONTRAST  Result Date: 01/28/2020 CLINICAL DATA:  Trauma, headache EXAM: CT HEAD WITHOUT CONTRAST TECHNIQUE: Contiguous axial images were obtained from the base of the skull through the vertex without intravenous contrast. COMPARISON:  03/08/2019 FINDINGS: Brain: There is no acute intracranial  hemorrhage, mass effect, or edema. Gray-white differentiation is preserved. There is no extra-axial fluid collection. Patchy hypoattenuation in the supratentorial white matter is nonspecific but probably reflects stable chronic microvascular ischemic changes. Prominence of the ventricles and sulci reflects stable parenchymal volume loss. There is disproportionate medial temporal lobe volume loss with ex vacuo dilatation of the temporal horns. Vascular: No hyperdense vessel or unexpected calcification. Skull: Calvarium is unremarkable. Sinuses/Orbits: No acute finding. Other: None. IMPRESSION: No acute intracranial hemorrhage or mass effect. Stable chronic findings detailed above. Electronically Signed   By: Guadlupe Spanish M.D.   On: 01/28/2020 17:07   DG Chest Portable 1 View  Result Date: 01/28/2020 CLINICAL DATA:  Pain following fall EXAM: PORTABLE CHEST 1 VIEW COMPARISON:  Mar 10, 2019. FINDINGS: There is slight scarring in each upper lobe. Lungs elsewhere clear. Heart size and pulmonary vascularity are normal. There is aortic atherosclerosis. No pneumothorax. No bone lesions. IMPRESSION: Mild upper lobe scarring. No edema or consolidation. Heart size normal. Aortic Atherosclerosis (ICD10-I70.0). Electronically Signed   By: Bretta Bang III M.D.   On: 01/28/2020 13:05   DG Hip Port Unilat With Pelvis 1V Right  Result Date: 01/29/2020 CLINICAL DATA:  Right hip replacement. EXAM: DG HIP (WITH OR WITHOUT PELVIS) 1V PORT RIGHT COMPARISON:  Right hip x-ray from yesterday. FINDINGS: The right hip demonstrates a hemiarthroplasty without evidence of hardware failure or complication. There is no fracture or dislocation. The alignment is anatomic. Post-surgical changes noted in the surrounding soft tissues. IMPRESSION: Interval right hip hemiarthroplasty without acute postoperative complication. Electronically Signed   By: Obie Dredge M.D.   On: 01/29/2020 16:16   DG Hip Unilat W or Wo Pelvis 2-3 Views  Right  Result Date: 01/28/2020 CLINICAL DATA:  Pain following fall EXAM: DG HIP (WITH OR WITHOUT PELVIS) 2-3V RIGHT COMPARISON:  None. FINDINGS: Frontal pelvis as well as frontal and lateral views obtained. There is a subcapital femoral neck fracture with mild impaction at the fracture site. There is Verus angulation at the fracture site. No other fracture. No dislocation. Bones are osteoporotic. There is moderate narrowing of each hip joint. IMPRESSION: Subcapital femoral neck fracture on the right with impaction and varus angulation at fracture site. No other fracture. No dislocation. Bones osteoporotic. Moderate symmetric narrowing noted in each hip joint. Electronically Signed   By: Bretta Bang III M.D.   On: 01/28/2020 13:04     Scheduled Meds: . amLODipine  2.5 mg Oral Daily  . Chlorhexidine Gluconate Cloth  6 each Topical Daily  . docusate sodium  100 mg Oral BID  . donepezil  5 mg Oral Daily  . [START ON 01/30/2020] enoxaparin (LOVENOX) injection  40 mg Subcutaneous  Q24H  . [START ON 01/30/2020] feeding supplement (ENSURE ENLIVE)  237 mL Oral BID BM  . insulin aspart  0-15 Units Subcutaneous TID WC  . insulin aspart  0-5 Units Subcutaneous QHS  . mirtazapine  7.5 mg Oral Daily  . [START ON 01/30/2020] multivitamin with minerals  1 tablet Oral Daily  . senna  1 tablet Oral BID  . traMADol  50 mg Oral Q6H   Continuous Infusions: . sodium chloride 50 mL/hr at 01/29/20 1705  . cefTRIAXone (ROCEPHIN)  IV 0 g (01/28/20 1509)    Principal Problem:   Closed right hip fracture (HCC) Active Problems:   Type II diabetes mellitus with renal manifestations (HCC)   Benign essential HTN   UTI (urinary tract infection)   Dementia (HCC)   Hypokalemia   Fall at home, initial encounter     Pieter Partridge, [facility]  If 7PM-7AM, please contact night-coverage [email or web address] Password [facility] 01/29/2020, 6:28 PM    LOS: 1 day

## 2020-01-29 NOTE — Plan of Care (Signed)
  Problem: Safety: Goal: Ability to remain free from injury will improve Outcome: Completed/Met

## 2020-01-30 LAB — GLUCOSE, CAPILLARY
Glucose-Capillary: 146 mg/dL — ABNORMAL HIGH (ref 70–99)
Glucose-Capillary: 276 mg/dL — ABNORMAL HIGH (ref 70–99)
Glucose-Capillary: 114 mg/dL — ABNORMAL HIGH (ref 70–99)
Glucose-Capillary: 179 mg/dL — ABNORMAL HIGH (ref 70–99)

## 2020-01-30 LAB — CBC
HCT: 32.1 % — ABNORMAL LOW (ref 36.0–46.0)
Hemoglobin: 10.3 g/dL — ABNORMAL LOW (ref 12.0–15.0)
MCH: 28.4 pg (ref 26.0–34.0)
MCHC: 32.1 g/dL (ref 30.0–36.0)
MCV: 88.4 fL (ref 80.0–100.0)
Platelets: 156 10*3/uL (ref 150–400)
RBC: 3.63 MIL/uL — ABNORMAL LOW (ref 3.87–5.11)
RDW: 13.8 % (ref 11.5–15.5)
WBC: 8 10*3/uL (ref 4.0–10.5)
nRBC: 0 % (ref 0.0–0.2)

## 2020-01-30 LAB — BASIC METABOLIC PANEL
Anion gap: 10 (ref 5–15)
BUN: 22 mg/dL (ref 8–23)
CO2: 24 mmol/L (ref 22–32)
Calcium: 8.7 mg/dL — ABNORMAL LOW (ref 8.9–10.3)
Chloride: 103 mmol/L (ref 98–111)
Creatinine, Ser: 0.73 mg/dL (ref 0.44–1.00)
GFR calc Af Amer: >60 mL/min (ref >60)
GFR calc non Af Amer: >60 mL/min (ref >60)
Glucose, Bld: 205 mg/dL — ABNORMAL HIGH (ref 70–99)
Potassium: 3.8 mmol/L (ref 3.5–5.1)
Sodium: 137 mmol/L (ref 135–145)

## 2020-01-30 NOTE — Evaluation (Signed)
Physical Therapy Evaluation Patient Details Name: Melanie Turner MRN: [id number] DOB: [birth year] Today's Date: 01/30/2020   History of Present Illness  Pt is an 86 y.o. female presenting to hospital 01/28/20 with difficulty walking after a fall.  Pt noted with R subcapital femoral neck fx with impaction and varus angulation at fx site.  Pt s/p R hip hemiarthroplasty 01/29/20 with posterolateral approach.  PMH includes dementia, DM, htn, seizure, thyroidectomy.  Clinical Impression  Pt resting in bed upon PT arrival; able to state name and month of birthday only.  Participated in LE ex's in bed with cueing and assist.  2 assist semi-supine to sitting edge of bed (initial posterior L lean requiring max assist for sitting balance but improved to close SBA).  Able to stand up to RW with mod assist x2 but pt unable to take any steps with 2 assist.  Then able to perform stand pivot bed to recliner to L with mod assist x2.  HR 98 bpm at rest but increased to 131 bpm after transfer to recliner; HR decreased to 110-113 bpm with sitting rest (nurse notified).  Mild pain-like behavior (R hip/thigh) noted with activity but none at rest.  Pt would benefit from skilled PT to address noted impairments and functional limitations (see below for any additional details).  Upon hospital discharge, pt would benefit from STR.    Follow Up Recommendations SNF    Equipment Recommendations  Rolling walker with 5" wheels;3in1 (PT)    Recommendations for Other Services OT consult     Precautions / Restrictions Precautions Precautions: Posterior Hip;Fall Precaution Booklet Issued: Yes (comment) Restrictions Weight Bearing Restrictions: Yes RLE Weight Bearing: Weight bearing as tolerated      Mobility  Bed Mobility Overal bed mobility: Needs Assistance Bed Mobility: Supine to Sit     Supine to sit: Mod assist;Max assist;+2 for physical assistance;HOB elevated     General bed mobility comments: assist for trunk  and B LE's  Transfers Overall transfer level: Needs assistance Equipment used: Rolling walker (2 wheeled);None Transfers: Sit to/from [facility] Sit to Stand: Mod assist;+2 physical assistance Stand pivot transfers: Mod assist;+2 physical assistance       General transfer comment: mod assist x2 to stand up to RW with vc's and tactile cues for UE/LE placement (assist to initiate stand and come to almost full stand with cueing for upright posture); stand pivot bed to recliner to L with mod assist  Ambulation/Gait Ambulation/Gait assistance: +2 physical assistance Gait Distance (Feet): 0 Feet Assistive device: Rolling walker (2 wheeled)       General Gait Details: pt unable to take any steps with max cueing, RW use, and 2 assist  Stairs            Wheelchair Mobility    Modified Rankin (Stroke Patients Only)       Balance Overall balance assessment: Needs assistance Sitting-balance support: Bilateral upper extremity supported;Feet supported Sitting balance-[name] Scale: Poor Sitting balance - Comments: initially max assist for sitting balance d/t posterior lean but with cueing and repositioning improved to close SBA for safety (mild L lean d/t R hip pain) Postural control: Left lateral lean;Posterior lean Standing balance support: Bilateral upper extremity supported Standing balance-[name] Scale: Poor Standing balance comment: pt requiring B UE support on RW and 2 assist to maintain standing balance                             Pertinent  Vitals/Pain Pain Assessment: Faces Faces Pain Scale: No hurt(4/10 with activity/R LE movement) Pain Location: R hip Pain Descriptors / Indicators: Operative site guarding;Tender Pain Intervention(s): Limited activity within patient's tolerance;Monitored during session;Repositioned  O2 sats WFL on room air.    Home Living                   Additional Comments: Pt reports living with her mother in  1 level home with stairs (pt unable to verbalize any further details and no family present to verify information)    Prior Function           Comments: Pt unable to verbalize any details and no family present to verify information     Hand Dominance        Extremity/Trunk Assessment   Upper Extremity Assessment Upper Extremity Assessment: Overall WFL for tasks assessed    Lower Extremity Assessment Lower Extremity Assessment: RLE deficits/detail RLE Deficits / Details: at least 3/5 AROM DF/PF; at least 3-/5 R knee flexion/extension; hip flexion at least 2+/5 (limited d/t R hip pain) RLE: Unable to fully assess due to pain    Cervical / Trunk Assessment Cervical / Trunk Assessment: Normal  Communication   Communication: No difficulties  Cognition Arousal/Alertness: Awake/alert Behavior During Therapy: WFL for tasks assessed/performed Overall Cognitive Status: No family/caregiver present to determine baseline cognitive functioning                                 General Comments: Oriented to name and month of birthday only      General Comments General comments (skin integrity, edema, etc.): very mild drainage noted R hip/thigh honeycomb dressing.  Nursing cleared pt for participation in physical therapy.  Pt agreeable to PT session.    Exercises Total Joint Exercises Ankle Circles/Pumps: AROM;Strengthening;Both;10 reps;Supine Short Arc Quad: AAROM;Strengthening;Right;10 reps;Supine Heel Slides: AAROM;Strengthening;Right;10 reps;Supine Hip ABduction/ADduction: AAROM;Strengthening;Right;10 reps;Supine   Assessment/Plan    PT Assessment Patient needs continued PT services  PT Problem List Decreased strength;Decreased activity tolerance;Decreased balance;Decreased mobility;Decreased knowledge of use of DME;Decreased knowledge of precautions;Pain;Cardiopulmonary status limiting activity;Decreased skin integrity       PT Treatment Interventions DME  instruction;Gait training;Stair training;Functional mobility training;Therapeutic activities;Therapeutic exercise;Balance training;Patient/family education    PT Goals (Current goals can be found in the Care Plan section)  Acute Rehab PT Goals Patient Stated Goal: to improve mobility PT Goal Formulation: With patient Time For Goal Achievement: 02/13/20 Potential to Achieve Goals: Good    Frequency BID   Barriers to discharge Decreased caregiver support      Co-evaluation               AM-PAC PT "6 Clicks" Mobility  Outcome Measure Help needed turning from your back to your side while in a flat bed without using bedrails?: A Lot Help needed moving from lying on your back to sitting on the side of a flat bed without using bedrails?: Total Help needed moving to and from a bed to a chair (including a wheelchair)?: Total Help needed standing up from a chair using your arms (e.g., wheelchair or bedside chair)?: Total Help needed to walk in hospital room?: Total Help needed climbing 3-5 steps with a railing? : Total 6 Click Score: 7    End of Session Equipment Utilized During Treatment: Gait belt Activity Tolerance: Patient tolerated treatment well Patient left: in chair;with call bell/phone within reach;with chair alarm set;with SCD's reapplied;Other (comment)(pillow between  pt's knees for posterior hip precautions and also to elevate B heels) Nurse Communication: Mobility status;Precautions;Weight bearing status;Other (comment)(Pt's HR with activity) PT Visit Diagnosis: Other abnormalities of gait and mobility (R26.89);Muscle weakness (generalized) (M62.81);History of falling (Z91.81);Difficulty in walking, not elsewhere classified (R26.2);Pain Pain - Right/Left: Right Pain - part of body: Hip    Time: 7510-2585 PT Time Calculation (min) (ACUTE ONLY): 36 min   Charges:   PT Evaluation $PT Eval Low Complexity: 1 Low PT Treatments $Therapeutic Exercise: 8-22  mins $Therapeutic Activity: 8-22 mins        Hendricks Limes, PT 01/30/20, 9:49 AM

## 2020-01-30 NOTE — TOC Initial Note (Signed)
Transition of Care [facility]) - Initial/Assessment Note    Patient Details  Name: Melanie Turner MRN: [id number] Date of Birth: May 12, 1932  [facility] CM/SW Contact:    Trenton Founds, RN Phone Number: 01/30/2020, 1:50 PM  Clinical Narrative:   RNCM assessed patient at bedside with son present. Patient is alert, verbally responsive and appropriate. Son reports that patient lives with him and between he and his sister they take care of her.  Discussed therapy recommendation that she go to short term rehab for a while which they are agreeable to. Son reports that he is not aware of patient ever going to a facility and is ok with a bed search being initiated. Son reports that his sister may have more opinions about where she is discharged to, but he is agreeable for it to be sent out to everyone initially. PASSR started, under review. FL2 completed and sent for signature, bed search initiated.              Expected Discharge Plan: Skilled Nursing Facility Barriers to Discharge: No Barriers Identified   Patient Goals and CMS Choice        Expected Discharge Plan and Services Expected Discharge Plan: Skilled Nursing Facility   Discharge Planning Services: CM Consult Post Acute Care Choice: Skilled Nursing Facility Living arrangements for the past 2 months: Single Family Home                                      Prior Living Arrangements/Services Living arrangements for the past 2 months: Single Family Home Lives with:: Adult Children Patient language and need for interpreter reviewed:: Yes Do you feel safe going back to the place where you live?: Yes      Need for Family Participation in Patient Care: Yes (Comment) Care giver support system in place?: Yes (comment)   Criminal Activity/Legal Involvement Pertinent to Current Situation/Hospitalization: No - Comment as needed  Activities of Daily Living      Permission Sought/Granted Permission sought to share  information with : Family Supports                Emotional Assessment Appearance:: Appears stated age Attitude/Demeanor/Rapport: Engaged Affect (typically observed): Appropriate Orientation: : Oriented to Self, Oriented to Place, Oriented to  Time, Oriented to Situation Alcohol / Substance Use: Not Applicable Psych Involvement: No (comment)  Admission diagnosis:  Closed right hip fracture (HCC) [S72.001A] Closed fracture of right hip, initial encounter (HCC) [S72.001A] Patient Active Problem List   Diagnosis Date Noted  . UTI (urinary tract infection) 01/28/2020  . Dementia (HCC) 01/28/2020  . Hypokalemia 01/28/2020  . Fall at home, initial encounter 01/28/2020  . Closed right hip fracture (HCC) 01/28/2020  . Type II diabetes mellitus with renal manifestations (HCC) 03/14/2019  . Benign essential HTN 03/14/2019  . Seizure (HCC) 03/08/2019   PCP:  Lauro Regulus, MD Pharmacy:   [facility] [phone] [place], [place] - [address] [place] [address] Phone: [phone] Fax: [phone]  [facility] [place], [place] - [address] [place] [address] Phone: [phone] Fax: [phone]     Social Determinants of Health (SDOH) Interventions    Readmission Risk Interventions No flowsheet data found.

## 2020-01-30 NOTE — Progress Notes (Signed)
Physical Therapy Treatment Patient Details Name: Melanie Turner MRN: [id number] DOB: 06/29/1932 Today's Date: 01/30/2020    History of Present Illness Pt is an 86 y.o. female presenting to hospital 01/28/20 with difficulty walking after a fall.  Pt noted with R subcapital femoral neck fx with impaction and varus angulation at fx site.  Pt s/p R hip hemiarthroplasty 01/29/20 with posterolateral approach.  PMH includes dementia, DM, htn, seizure, thyroidectomy.    PT Comments    PT/OT co-session performed.  Pt resting in recliner upon therapy arrival; pt's son present during session.  Pt's son able to give PLOF and home set-up information for pt (d/t pt pleasantly confused).  Mod assist x2 to stand up to RW x2 trials but pt unable to take any steps with 2 assist.  Able to perform stand pivot recliner to bed to R with max assist x2 and then 2 assist to lay back down in bed.  HR 102 bpm at rest beginning of session; HR increased to 131 bpm with activity (although telemetry monitor reporting HR as high as 147 bpm): and HR 100 bpm at rest end of session; nurse notified regarding pt's HR during session.  Assist required to maintain posterior hip precautions during sessions activities.  Will continue to focus on strengthening and progressive functional mobility per pt tolerance.    Follow Up Recommendations  SNF     Equipment Recommendations  Rolling walker with 5" wheels;3in1 (PT);Wheelchair (measurements PT);Wheelchair cushion (measurements PT)    Recommendations for Other Services OT consult     Precautions / Restrictions Precautions Precautions: Posterior Hip;Fall Precaution Booklet Issued: Yes (comment) Restrictions Weight Bearing Restrictions: Yes RLE Weight Bearing: Weight bearing as tolerated    Mobility  Bed Mobility Overal bed mobility: Needs Assistance Bed Mobility: Sit to Supine       Sit to supine: Mod assist;Max assist;+2 for physical assistance;HOB elevated   General bed  mobility comments: assist for trunk and B LE's; 2 assist to boost pt up in bed via bed sheets  Transfers Overall transfer level: Needs assistance Equipment used: Rolling walker (2 wheeled);None Transfers: Sit to/from [facility] Sit to Stand: Mod assist;+2 physical assistance         General transfer comment: mod assist x2 to stand up to RW with vc's and tactile cues for UE/LE placement (assist to initiate stand and come to almost full stand with cueing for upright posture) x2 trials; stand pivot recliner to bed to R with max assist x2  Ambulation/Gait Ambulation/Gait assistance: +2 physical assistance Gait Distance (Feet): 0 Feet Assistive device: Rolling walker (2 wheeled)       General Gait Details: pt unable to take any steps with max cueing, RW use, and 2 assist   Stairs             Wheelchair Mobility    Modified Rankin (Stroke Patients Only)       Balance Overall balance assessment: Needs assistance Sitting-balance support: Bilateral upper extremity supported;Feet supported Sitting balance-[name] Scale: Poor Sitting balance - Comments: pt requiring UE support for static sitting balance (mild lean towards L d/t R hip/thigh pain) Postural control: Left lateral lean Standing balance support: Bilateral upper extremity supported Standing balance-[name] Scale: Poor Standing balance comment: pt requiring B UE support on RW and 2 assist to maintain standing balance                            Cognition Arousal/Alertness: Awake/alert  Behavior During Therapy: WFL for tasks assessed/performed Overall Cognitive Status: History of cognitive impairments - at baseline                                 General Comments: Oriented to name      Exercises      General Comments General comments (skin integrity, edema, etc.): very mild drainage noted R hip/thigh honeycomb dressing.  Pt agreeable to PT session.      Pertinent  Vitals/Pain Pain Assessment: Faces Faces Pain Scale: (4-6/10 with movement/activity) Pain Location: R hip Pain Descriptors / Indicators: Operative site guarding;Tender Pain Intervention(s): Limited activity within patient's tolerance;Monitored during session;Repositioned  O2 sats WFL on room air during session.    Home Living Family/patient expects to be discharged to:: Private residence Living Arrangements: Children(pt's son) Available Help at Discharge: Family;Available PRN/intermittently Type of Home: House Home Access: Stairs to enter Entrance Stairs-Rails: None Home Layout: One level Home Equipment: Grab bars - tub/shower;Bedside commode;Walker - 2 wheels;Cane - single point      Prior Function Level of Independence: Needs assistance  Gait / Transfers Assistance Needed: Independent with ambulation ADL's / Homemaking Assistance Needed: pt's son assists pt with tub baths, medications, meals, and cleaning Comments: Pt's son reports no other falls in past 6 months (other than most current fall)   PT Goals (current goals can now be found in the care plan section) Acute Rehab PT Goals Patient Stated Goal: to improve mobility PT Goal Formulation: With patient/family Time For Goal Achievement: 02/13/20 Potential to Achieve Goals: Good Progress towards PT goals: Progressing toward goals    Frequency    BID      PT Plan      Co-evaluation PT/OT/SLP Co-Evaluation/Treatment: Yes Reason for Co-Treatment: Necessary to address cognition/behavior during functional activity;For patient/therapist safety;To address functional/ADL transfers PT goals addressed during session: Mobility/safety with mobility;Balance;Proper use of DME OT goals addressed during session: Proper use of Adaptive equipment and DME;ADL's and self-care      AM-PAC PT "6 Clicks" Mobility   Outcome Measure  Help needed turning from your back to your side while in a flat bed without using bedrails?: A Lot Help  needed moving from lying on your back to sitting on the side of a flat bed without using bedrails?: Total Help needed moving to and from a bed to a chair (including a wheelchair)?: Total Help needed standing up from a chair using your arms (e.g., wheelchair or bedside chair)?: Total Help needed to walk in hospital room?: Total Help needed climbing 3-5 steps with a railing? : Total 6 Click Score: 7    End of Session Equipment Utilized During Treatment: Gait belt Activity Tolerance: Patient tolerated treatment well Patient left: in bed;with call bell/phone within reach;with bed alarm set;with family/visitor present;with SCD's reapplied;Other (comment)(hip aBduction pillow in place; B heels floating via towel rolls; bed in lowest position) Nurse Communication: Mobility status;Precautions;Weight bearing status;Other (comment)(Pt's HR during session.) PT Visit Diagnosis: Other abnormalities of gait and mobility (R26.89);Muscle weakness (generalized) (M62.81);History of falling (Z91.81);Difficulty in walking, not elsewhere classified (R26.2);Pain Pain - Right/Left: Right Pain - part of body: Hip     Time: 1610-9604 PT Time Calculation (min) (ACUTE ONLY): 41 min  Charges:  $Therapeutic Activity: 23-37 mins                     Hendricks Limes, PT 01/30/20, 2:33 PM

## 2020-01-30 NOTE — NC FL2 (Signed)
[place] MEDICAID FL2 LEVEL OF CARE SCREENING TOOL     IDENTIFICATION  [name] Birthdate: 11/07/1931 Sex: female Admission Date (Current Location): 01/28/2020  [facility] [place] and [place] Number:  Chiropodist and Address:  [facility], [address], [place], [place] [address]      Provider Number:    Attending Physician Name and Address:  Salomon Mast*  Relative Name and Phone Number:       Current Level of Care: Hospital Recommended Level of Care: Skilled Nursing Facility Prior Approval Number:    Date Approved/Denied:   PASRR Number:    Discharge Plan: SNF    Current Diagnoses: Patient Active Problem List   Diagnosis Date Noted  . UTI (urinary tract infection) 01/28/2020  . Dementia (HCC) 01/28/2020  . Hypokalemia 01/28/2020  . Fall at home, initial encounter 01/28/2020  . Closed right hip fracture (HCC) 01/28/2020  . Type II diabetes mellitus with renal manifestations (HCC) 03/14/2019  . Benign essential HTN 03/14/2019  . Seizure (HCC) 03/08/2019    Orientation RESPIRATION BLADDER Height & Weight     Self  Normal Incontinent Weight: 54.4 kg Height:  5\' 3"  (160 cm)  BEHAVIORAL SYMPTOMS/MOOD NEUROLOGICAL BOWEL NUTRITION STATUS      Incontinent Diet(Regular)  AMBULATORY STATUS COMMUNICATION OF NEEDS Skin   Extensive Assist Verbally Normal                       Personal Care Assistance Level of Assistance  Bathing, Dressing, Feeding Bathing Assistance: Maximum assistance Feeding assistance: Maximum assistance Dressing Assistance: Maximum assistance     Functional Limitations Info             SPECIAL CARE FACTORS FREQUENCY  PT (By licensed PT), OT (By licensed OT)                    Contractures Contractures Info: Not present    Additional Factors Info  Code Status, Allergies Code Status Info: DNR Allergies Info: NKA           Current Medications (01/30/2020):   This is the current hospital active medication list Current Facility-Administered Medications  Medication Dose Route Frequency Provider Last Rate Last Admin  . 0.9 %  sodium chloride infusion   Intravenous Continuous 03/31/2020, MD 50 mL/hr at 01/29/20 1705 Restarted at 01/29/20 1705  . acetaminophen (TYLENOL) tablet 650 mg  650 mg Oral Q6H PRN 01/31/20, MD      . alum & mag hydroxide-simeth (MAALOX/MYLANTA) 200-200-20 MG/5ML suspension 30 mL  30 mL Oral Q4H PRN 05-09-2001, MD      . amLODipine (NORVASC) tablet 2.5 mg  2.5 mg Oral Daily Juanell Fairly, MD   2.5 mg at 01/30/20 0825  . bisacodyl (DULCOLAX) suppository 10 mg  10 mg Rectal Daily PRN 03/31/20, MD      . cefTRIAXone (ROCEPHIN) 1 g in sodium chloride 0.9 % 100 mL IVPB  1 g Intravenous Q24H Juanell Fairly, MD 0 mL/hr at 01/28/20 1509 1 g at 01/29/20 1402  . Chlorhexidine Gluconate Cloth 2 % PADS 6 each  6 each Topical Daily 01/31/20, MD   6 each at 01/29/20 1044  . docusate sodium (COLACE) capsule 100 mg  100 mg Oral BID 01/31/20, MD   100 mg at 01/30/20 0818  . donepezil (ARICEPT) tablet 5 mg  5 mg Oral Daily 03/31/20, MD   5 mg at 01/30/20 0818  .  enoxaparin (LOVENOX) injection 40 mg  40 mg Subcutaneous Q24H [place] Park, MD   40 mg at 01/30/20 0819  . feeding supplement (ENSURE ENLIVE) (ENSURE ENLIVE) liquid 237 mL  237 mL Oral BID BM Thornton Park, MD   237 mL at 01/30/20 1233  . fentaNYL (SUBLIMAZE) injection 25 mcg  25 mcg Intravenous Q1H PRN Thornton Park, MD      . hydrALAZINE (APRESOLINE) injection 5 mg  5 mg Intravenous Q2H PRN Thornton Park, MD   5 mg at 01/29/20 3825  . HYDROcodone-acetaminophen (NORCO) 7.5-325 MG per tablet 1-2 tablet  1-2 tablet Oral Q4H PRN Thornton Park, MD      . HYDROcodone-acetaminophen (NORCO/VICODIN) 5-325 MG per tablet 1-2 tablet  1-2 tablet Oral Q4H PRN Thornton Park, MD      . insulin aspart (novoLOG) injection 0-15 Units   0-15 Units Subcutaneous TID WC Thornton Park, MD   8 Units at 01/30/20 1233  . insulin aspart (novoLOG) injection 0-5 Units  0-5 Units Subcutaneous QHS Thornton Park, MD   2 Units at 01/29/20 2118  . magnesium citrate solution 1 Bottle  1 Bottle Oral Once PRN Thornton Park, MD      . methocarbamol (ROBAXIN) tablet 500 mg  500 mg Oral Q8H PRN Thornton Park, MD      . mirtazapine (REMERON) tablet 7.5 mg  7.5 mg Oral Daily Thornton Park, MD   7.5 mg at 01/30/20 0819  . morphine 2 MG/ML injection 1 mg  1 mg Intravenous Q4H PRN Thornton Park, MD      . multivitamin with minerals tablet 1 tablet  1 tablet Oral Daily Thornton Park, MD   1 tablet at 01/30/20 0818  . ondansetron (ZOFRAN) tablet 4 mg  4 mg Oral Q6H PRN Thornton Park, MD       Or  . ondansetron [facility]) injection 4 mg  4 mg Intravenous Q6H PRN Thornton Park, MD      . polyethylene glycol (MIRALAX / GLYCOLAX) packet 17 g  17 g Oral Daily PRN Thornton Park, MD      . senna (SENOKOT) tablet 8.6 mg  1 tablet Oral BID Thornton Park, MD   8.6 mg at 01/30/20 0819  . senna-docusate (Senokot-S) tablet 1 tablet  1 tablet Oral QHS PRN Thornton Park, MD      . traMADol Veatrice Bourbon) tablet 50 mg  50 mg Oral Q6H Thornton Park, MD   50 mg at 01/30/20 1239     Discharge Medications: Please see discharge summary for a list of discharge medications.  Relevant Imaging Results:  Relevant Lab Results:   Additional Information SS# [id number]  Shelbie Ammons, RN

## 2020-01-30 NOTE — Progress Notes (Signed)
PROGRESS NOTE    Melanie Turner  YTK:[id number]  DOB: May 08, 1932  DOA: 01/28/2020 PCP: Kirk Ruths, MD Outpatient Specialists:   Hospital course:  Melanie Turner is a 86 y.o. female with medical history significant of dementia, hypertension, diabetes mellitus, depression, one-time seizure, who was admitted 01/27/1930 with right hip fracture status post mechanical fall although work-up does reveal UTI.  X-ray reveals subcapital femoral neck fracture with impaction and varus angulation.  Subjective:  Patient was sitting up in chair eating with son at bedside.  She said she was feeling fine.  She said she only had a little bit of pain.  She did not initially remember that she had surgery yesterday but when I reminded her she said "oh yes yes" and left.  Objective: Vitals:   01/29/20 2258 01/30/20 0341 01/30/20 0750 01/30/20 1158  BP: 122/84 117/81 140/84 135/60  Pulse: 89 92 93 100  Resp: 17 16 18 18   Temp: 97.6 F (36.4 C) 98 F (36.7 C) 98.4 F (36.9 C) (!) 97.4 F (36.3 C)  TempSrc: Oral Oral Oral Oral  SpO2: 98% 99% 99% 97%  Weight:      Height:        Intake/Output Summary (Last 24 hours) at 01/30/2020 1454 Last data filed at 01/30/2020 1442 Gross per 24 hour  Intake 870 ml  Output 475 ml  Net 395 ml   Filed Weights   01/28/20 1134  Weight: 54.4 kg     Assessment & Plan:   86 year old female admitted with right hip fracture status post mechanical fall.  Incidentally found to have low-grade UTI.  Closed right hip fracture Patient being followed by orthopedics for surgery in the morning. Pain management and DVT prophylaxis per orthopedics. PT OT per orthopedics.  UTI (urinary tract infection) Day #3 of Rocephin Urine culture pending  Hypokalemia Normalized with repletion  Type II diabetes mellitus with renal manifestations  Under reasonable but not optimal control however given lability, would not want to try to tighten coverage at this point  as low blood sugar would be more detrimental than intermittent high ones. Most recent A1c 11.4, poorly controled.  SSI AC at bedtime Hold Metformin   HTN Blood pressure under good control with amlodipine 2.5 which was started on this admission.  Dementia  Continue donepezil   DVT prophylaxis: Lovenox Code Status: DNR Family Communication: Spoke with patient's son who was at bedside today.  He had no further questions. Disposition Plan:   Patient is from: Home  Anticipated Discharge Location: Rehab  Barriers to Discharge: Status post surgery this morning  Is patient medically stable for Discharge: Yes when orthopedics feels she is ready to go.   Consultants:  Orthopedics  Procedures:  Right hemiarthroplasty of hip  Antimicrobials:  Rocephin   Exam:  General: Pleasantly demented thin female in good spirits sitting up in chair eating breakfast with son at bedside. Eyes: sclera anicteric, conjuctiva mild injection bilaterally CVS: S1-S2, regular  Respiratory:  decreased air entry bilaterally secondary to decreased inspiratory effort, rales at bases  GI: NABS, soft, NT  LE: No edema.    Data Reviewed: Basic Metabolic Panel: Recent Labs  Lab 01/28/20 1157 01/29/20 0445 01/30/20 0520  NA 137 140 137  K 3.3* 3.2* 3.8  CL 98 104 103  CO2 26 26 24   GLUCOSE 299* 184* 205*  BUN 20 16 22   CREATININE 0.94 0.64 0.73  CALCIUM 9.7 9.1 8.7*  MG  --  1.9  --  Liver Function Tests: Recent Labs  Lab 01/28/20 1157  AST 37  ALT 23  ALKPHOS 46  BILITOT 1.6*  PROT 7.9  ALBUMIN 4.1   No results for input(s): LIPASE, AMYLASE in the last 168 hours. No results for input(s): AMMONIA in the last 168 hours. CBC: Recent Labs  Lab 01/28/20 1157 01/29/20 0445 01/30/20 0520  WBC 8.4 8.8 8.0  HGB 12.1 12.1 10.3*  HCT 36.9 36.5 32.1*  MCV 87.0 85.7 88.4  PLT 178 151 156   Cardiac Enzymes: No results for input(s): CKTOTAL, CKMB, CKMBINDEX, TROPONINI in the last  168 hours. BNP (last 3 results) No results for input(s): PROBNP in the last 8760 hours. CBG: Recent Labs  Lab 01/29/20 1613 01/29/20 1639 01/29/20 2114 01/30/20 0752 01/30/20 1200  GLUCAP 126* 126* 203* 146* 276*    Recent Results (from the past 240 hour(s))  Urine Culture     Status: None   Collection Time: 01/28/20  1:13 PM   Specimen: Urine, Random  Result Value Ref Range Status   Specimen Description   Final    URINE, RANDOM Performed at [facility], [address] [place], [place] [address]    Special Requests   Final    NONE Performed at [facility], [address], [place], [place] [address]    Culture   Final    NO GROWTH Performed at [facility], [address], [place], [place] [address]    Report Status 01/29/2020 FINAL  Final  Respiratory Panel by RT PCR (Flu A&B, Covid) - Nasopharyngeal Swab     Status: None   Collection Time: 01/28/20  1:42 PM   Specimen: Nasopharyngeal Swab  Result Value Ref Range Status   SARS Coronavirus 2 by RT PCR NEGATIVE NEGATIVE Final    Comment: (NOTE) SARS-CoV-2 target nucleic acids are NOT DETECTED. The SARS-CoV-2 RNA is generally detectable in upper respiratoy specimens during the acute phase of infection. The lowest concentration of SARS-CoV-2 viral copies this assay can detect is 131 copies/mL. A negative result does not preclude SARS-Cov-2 infection and should not be used as the sole basis for treatment or other patient management decisions. A negative result may occur with  improper specimen collection/handling, submission of specimen other than nasopharyngeal swab, presence of viral mutation(s) within the areas targeted by this assay, and inadequate number of viral copies (<131 copies/mL). A negative result must be combined with clinical observations, patient history, and epidemiological information. The expected result is Negative. Fact Sheet for Patients:    https://www.moore.com/ Fact Sheet for Healthcare Providers:  https://www.young.biz/ This test is not yet ap proved or cleared by the [place] FDA and  has been authorized for detection and/or diagnosis of SARS-CoV-2 by FDA under an Emergency Use Authorization (EUA). This EUA will remain  in effect (meaning this test can be used) for the duration of the COVID-19 declaration under Section 564(b)(1) of the Act, 21 U.S.C. section 360bbb-3(b)(1), unless the authorization is terminated or revoked sooner.    Influenza A by PCR NEGATIVE NEGATIVE Final   Influenza B by PCR NEGATIVE NEGATIVE Final    Comment: (NOTE) The Xpert Xpress SARS-CoV-2/FLU/RSV assay is intended as an aid in  the diagnosis of influenza from Nasopharyngeal swab specimens and  should not be used as a sole basis for treatment. Nasal washings and  aspirates are unacceptable for Xpert Xpress SARS-CoV-2/FLU/RSV  testing. Fact Sheet for Patients: https://www.moore.com/ Fact Sheet for Healthcare Providers: https://www.young.biz/ This test is not yet  approved or cleared by the [place] and  has been authorized for detection and/or diagnosis of SARS-CoV-2 by  FDA under an Emergency Use Authorization (EUA). This EUA will remain  in effect (meaning this test can be used) for the duration of the  Covid-19 declaration under Section 564(b)(1) of the Act, 21  U.S.C. section 360bbb-3(b)(1), unless the authorization is  terminated or revoked. Performed at [facility], [address] [place], [place] [address]   Surgical pcr screen     Status: None   Collection Time: 01/29/20  3:35 AM   Specimen: Nasal Mucosa; Nasal Swab  Result Value Ref Range Status   MRSA, PCR NEGATIVE NEGATIVE Final   Staphylococcus aureus NEGATIVE NEGATIVE Final    Comment: (NOTE) The Xpert SA Assay (FDA approved for NASAL specimens in patients 22 years of  age and older), is one component of a comprehensive surveillance program. It is not intended to diagnose infection nor to guide or monitor treatment. Performed at [facility], [address] [place], [place] [address]       Studies: CT HEAD WO CONTRAST  Result Date: 01/28/2020 CLINICAL DATA:  Trauma, headache EXAM: CT HEAD WITHOUT CONTRAST TECHNIQUE: Contiguous axial images were obtained from the base of the skull through the vertex without intravenous contrast. COMPARISON:  03/08/2019 FINDINGS: Brain: There is no acute intracranial hemorrhage, mass effect, or edema. Gray-white differentiation is preserved. There is no extra-axial fluid collection. Patchy hypoattenuation in the supratentorial white matter is nonspecific but probably reflects stable chronic microvascular ischemic changes. Prominence of the ventricles and sulci reflects stable parenchymal volume loss. There is disproportionate medial temporal lobe volume loss with ex vacuo dilatation of the temporal horns. Vascular: No hyperdense vessel or unexpected calcification. Skull: Calvarium is unremarkable. Sinuses/Orbits: No acute finding. Other: None. IMPRESSION: No acute intracranial hemorrhage or mass effect. Stable chronic findings detailed above. Electronically Signed   By: Guadlupe Spanish M.D.   On: 01/28/2020 17:07   DG Hip Port Unilat With Pelvis 1V Right  Result Date: 01/29/2020 CLINICAL DATA:  Right hip replacement. EXAM: DG HIP (WITH OR WITHOUT PELVIS) 1V PORT RIGHT COMPARISON:  Right hip x-ray from yesterday. FINDINGS: The right hip demonstrates a hemiarthroplasty without evidence of hardware failure or complication. There is no fracture or dislocation. The alignment is anatomic. Post-surgical changes noted in the surrounding soft tissues. IMPRESSION: Interval right hip hemiarthroplasty without acute postoperative complication. Electronically Signed   By: Obie Dredge M.D.   On: 01/29/2020 16:16     Scheduled  Meds: . amLODipine  2.5 mg Oral Daily  . Chlorhexidine Gluconate Cloth  6 each Topical Daily  . docusate sodium  100 mg Oral BID  . donepezil  5 mg Oral Daily  . enoxaparin (LOVENOX) injection  40 mg Subcutaneous Q24H  . feeding supplement (ENSURE ENLIVE)  237 mL Oral BID BM  . insulin aspart  0-15 Units Subcutaneous TID WC  . insulin aspart  0-5 Units Subcutaneous QHS  . mirtazapine  7.5 mg Oral Daily  . multivitamin with minerals  1 tablet Oral Daily  . senna  1 tablet Oral BID  . traMADol  50 mg Oral Q6H   Continuous Infusions: . sodium chloride 50 mL/hr at 01/29/20 1705  . cefTRIAXone (ROCEPHIN)  IV 0 g (01/28/20 1509)    Principal Problem:   Closed right hip fracture (HCC) Active Problems:   Type II diabetes mellitus with renal manifestations (HCC)   Benign essential HTN   UTI (  urinary tract infection)   Dementia (HCC)   Hypokalemia   Fall at home, initial encounter     Pieter Partridge, [facility]  If 7PM-7AM, please contact night-coverage [email or web address] Password [email or web address] 01/30/2020, 2:54 PM    LOS: 2 days

## 2020-01-30 NOTE — Evaluation (Signed)
Occupational Therapy Evaluation Patient Details Name: Melanie Turner MRN: [id number] DOB: 10/19/1932 Today's Date: 01/30/2020    History of Present Illness Pt is an 86 y.o. female presenting to hospital 01/28/20 with difficulty walking after a fall.  Pt noted with R subcapital femoral neck fx with impaction and varus angulation at fx site.  Pt s/p R hip hemiarthroplasty 01/29/20 with posterolateral approach.  PMH includes dementia, DM, htn, seizure, thyroidectomy.   Clinical Impression   [name] seen for OT/PT co-session this date, POD#1 from above surgery. Pt son was present at bedside throughout the evaluation and provides information regarding PLOF/home set up 2/2 pt hx of cognitive impairment at baseline. Pt son reports she was generally independent for functional mobility and denies additional falls history in the past 6 months. Per pt son, pt prefers to take tub baths and he assists her with getting in/out of the bathtub. Pt lives with her son in a 1 level home with one step to enter. Pt currently requires maximal assist for LB dressing while in seated position due to pain and limited AROM of R hip. Pt unable to recall posterior total hip precautions at start of session and unable to verbalize how to implement during ADL and mobility. Pt and caregiver instructed in posterior total hip precautions and how to implement, self care skills, falls prevention strategies, and safe transfer techniques. Pt is pleasant and conversational throughout session, but provides no evidence of retention of education provided regarding posterior THPs and considerations for ADL management. Son present at bedside and return verbalizes understanding. Pt left in bed with bed alarm set, hip abduction pillow in place, SCDs applied, and son at bedside. Pt would benefit from additional instruction in self care skills and techniques to help maintain precautions with or without assistive devices to support recall and carryover  prior to discharge. Recommend STR upon discharge.       Follow Up Recommendations  SNF    Equipment Recommendations  Other (comment)(TBD at next venue of care. Pt has BSC.)    Recommendations for Other Services       Precautions / Restrictions Precautions Precautions: Posterior Hip;Fall Precaution Booklet Issued: Yes (comment) Restrictions Weight Bearing Restrictions: Yes RLE Weight Bearing: Weight bearing as tolerated      Mobility Bed Mobility Overal bed mobility: Needs Assistance Bed Mobility: Sit to Supine     Supine to sit: HOB elevated;Independent Sit to supine: Mod assist;Max assist;+2 for physical assistance;HOB elevated   General bed mobility comments: assist for trunk and B LE's; 2 assist to boost pt up in bed via bed sheets  Transfers Overall transfer level: Needs assistance Equipment used: Rolling walker (2 wheeled);None Transfers: Sit to/from [facility] Sit to Stand: Mod assist;+2 physical assistance Stand pivot transfers: Mod assist;+2 physical assistance       General transfer comment: Pt requires max assist x2 for STS attempts using RW with heavy tactile and VCs for sequencing/foot placement. Pt unable to come to full stand x2 trials. Requiers stand pivot to recliner with max A x2 this date.    Balance Overall balance assessment: Needs assistance Sitting-balance support: Bilateral upper extremity supported;Feet supported Sitting balance-[name] Scale: Poor Sitting balance - Comments: pt requiring UE support for static sitting balance (mild lean towards L d/t R hip/thigh pain) Postural control: Left lateral lean Standing balance support: Bilateral upper extremity supported Standing balance-[name] Scale: Poor Standing balance comment: pt requiring B UE support on RW and 2 assist to maintain standing balance  ADL either performed or assessed with clinical judgement   ADL Overall ADL's : Needs  assistance/impaired                                     Functional mobility during ADLs: Maximal assistance;+2 for safety/equipment;+2 for physical assistance General ADL Comments: Pt is significantly limited by increased pain, decreased AROM of her R hip, and baseline cognitive impairments. Pt is pleasant and conversational throughout session, but provides no evidence of retention of education provided regarding posterior THPs and considerations for ADL management. son present at bedside and return verbalizes understanding. Pt requires Max +2 assist for Squat pivot t/f to/from chair this date. Maximal to total assistance with additional ADL tasks at bed level including bathing, dressing, and toileting.     Vision         Perception     Praxis      Pertinent Vitals/Pain Pain Assessment: Faces Faces Pain Scale: Hurts even more Pain Location: R hip-with movement Pain Descriptors / Indicators: Operative site guarding;Tender;Grimacing;Guarding Pain Intervention(s): Limited activity within patient's tolerance;Monitored during session;Repositioned     Hand Dominance Right   Extremity/Trunk Assessment Upper Extremity Assessment Upper Extremity Assessment: Overall WFL for tasks assessed(Grip strength WNL. Pt grips tightly to therapists/chair rails with mobility. Requires redirection/cueing.)   Lower Extremity Assessment Lower Extremity Assessment: RLE deficits/detail RLE Deficits / Details: RLE s/p hemi-hip arthroplasty RLE: Unable to fully assess due to pain   Cervical / Trunk Assessment Cervical / Trunk Assessment: Normal   Communication Communication Communication: No difficulties   Cognition Arousal/Alertness: Awake/alert Behavior During Therapy: WFL for tasks assessed/performed Overall Cognitive Status: History of cognitive impairments - at baseline                                 General Comments: Oriented to name   General Comments  Mild  drainage noted at R hip/thigh honeycomb dressing    Exercises Other Exercises Other Exercises: Pt and caregiver (son at bedside throughout) educated on falls prevention strategies for home and hospital, posterior THPs as well as considerations for ADLs, and incentive spirometer use this date. Pt unable to return verbalize understanding of education provided, would benefit from further review/assist as well as ongoing caregiver education to support safety and adherence to THP's upon hospital DC,   Shoulder Instructions      Home Living Family/patient expects to be discharged to:: Private residence Living Arrangements: Children(Pt son) Available Help at Discharge: Family;Available PRN/intermittently Type of Home: House Home Access: Stairs to enter [employer] of Steps: 1 Entrance Stairs-Rails: None Home Layout: One level     Bathroom Shower/Tub: Chief Strategy Officer: Standard     Home Equipment: Grab bars - tub/shower;Bedside commode;Walker - 2 wheels;Cane - single point          Prior Functioning/Environment Level of Independence: Needs assistance  Gait / Transfers Assistance Needed: Independent with ambulation ADL's / Homemaking Assistance Needed: pt's son assists pt with tub baths, medications, meals, and cleaning. He denies pt has had any additional falls history in the past 6 months.   Comments: Pt's son reports no other falls in past 6 months (other than most current fall)        OT Problem List: Decreased strength;Decreased coordination;Pain;Decreased cognition;Decreased range of motion;Decreased activity tolerance;Decreased safety awareness;Decreased knowledge of use of DME or  AE;Decreased knowledge of precautions;Impaired balance (sitting and/or standing)      OT Treatment/Interventions: Self-care/ADL training;Therapeutic exercise;Therapeutic activities;DME and/or AE instruction;Patient/family education;Balance training;Cognitive  remediation/compensation    OT Goals(Current goals can be found in the care plan section) Acute Rehab OT Goals Patient Stated Goal: to improve mobility OT Goal Formulation: With patient Time For Goal Achievement: 02/13/20 Potential to Achieve Goals: Good  OT Frequency: Min 2X/week   Barriers to D/C: Inaccessible home environment;Decreased caregiver support          Co-evaluation   Reason for Co-Treatment: Necessary to address cognition/behavior during functional activity;For patient/therapist safety;To address functional/ADL transfers PT goals addressed during session: Mobility/safety with mobility;Balance;Proper use of DME OT goals addressed during session: ADL's and self-care;Proper use of Adaptive equipment and DME      AM-PAC OT "6 Clicks" Daily Activity     Outcome Measure Help from another person eating meals?: A Little Help from another person taking care of personal grooming?: A Lot Help from another person toileting, which includes using toliet, bedpan, or urinal?: A Lot Help from another person bathing (including washing, rinsing, drying)?: A Lot Help from another person to put on and taking off regular upper body clothing?: A Little Help from another person to put on and taking off regular lower body clothing?: A Lot 6 Click Score: 14   End of Session Equipment Utilized During Treatment: Gait belt;Rolling walker  Activity Tolerance: Patient tolerated treatment well Patient left: in bed;with call bell/phone within reach;with bed alarm set;with family/visitor present;with SCD's reapplied;Other (comment)(With hip abduction pillow in place.)  OT Visit Diagnosis: Other abnormalities of gait and mobility (R26.89);History of falling (Z91.81);Pain Pain - Right/Left: Right Pain - part of body: Hip                Time: 3154-0086 OT Time Calculation (min): 37 min Charges:  OT General Charges $OT Visit: 1 Visit OT Evaluation $OT Eval Moderate Complexity: 1 Mod OT  Treatments $Self Care/Home Management : 8-22 mins  Rockney Ghee, M.S., OTR/L Ascom: [phone] 01/30/20, 3:30 PM

## 2020-01-30 NOTE — Anesthesia Postprocedure Evaluation (Signed)
Anesthesia Post Note  Patient: Control and instrumentation engineer  Procedure(s) Performed: ARTHROPLASTY BIPOLAR HIP (HEMIARTHROPLASTY) (Right Hip)  Patient location during evaluation: Nursing Unit Anesthesia Type: Spinal Level of consciousness: awake and awake and alert Pain management: pain level controlled Vital Signs Assessment: post-procedure vital signs reviewed and stable Respiratory status: spontaneous breathing, nonlabored ventilation and respiratory function stable Cardiovascular status: blood pressure returned to baseline and stable Postop Assessment: no headache and no backache Anesthetic complications: no     Last Vitals:  Vitals:   01/30/20 0341 01/30/20 0750  BP: 117/81 140/84  Pulse: 92 93  Resp: 16 18  Temp: 36.7 C 36.9 C  SpO2: 99% 99%    Last Pain:  Vitals:   01/30/20 0750  TempSrc: Oral  PainSc:                  Melanie Turner

## 2020-01-30 NOTE — Progress Notes (Signed)
Subjective:  POD #1 s/p right hip hemiarthroplasty.   Patient reports left hip pain as mild.  Reviewed the physical and Occupational Therapy progress notes.  Objective:   VITALS:   Vitals:   01/30/20 0341 01/30/20 0750 01/30/20 1158 01/30/20 1617  BP: 117/81 140/84 135/60 (!) 160/83  Pulse: 92 93 100 95  Resp: 16 18 18 18   Temp: 98 F (36.7 C) 98.4 F (36.9 C) (!) 97.4 F (36.3 C) 99.7 F (37.6 C)  TempSrc: Oral Oral Oral Oral  SpO2: 99% 99% 97% 95%  Weight:      Height:        PHYSICAL EXAM: Right lower extremity:  Neurovascular intact Sensation intact distally Intact pulses distally Dorsiflexion/Plantar flexion intact Incision: scant drainage No cellulitis present Compartment soft  LABS  Results for orders placed or performed during the hospital encounter of 01/28/20 (from the past 24 hour(s))  Glucose, capillary     Status: Abnormal   Collection Time: 01/29/20  9:14 PM  Result Value Ref Range   Glucose-Capillary 203 (H) 70 - 99 mg/dL   Comment 1 Notify RN   CBC     Status: Abnormal   Collection Time: 01/30/20  5:20 AM  Result Value Ref Range   WBC 8.0 4.0 - 10.5 K/uL   RBC 3.63 (L) 3.87 - 5.11 MIL/uL   Hemoglobin 10.3 (L) 12.0 - 15.0 g/dL   HCT 32.1 (L) 36.0 - 46.0 %   MCV 88.4 80.0 - 100.0 fL   MCH 28.4 26.0 - 34.0 pg   MCHC 32.1 30.0 - 36.0 g/dL   RDW 13.8 11.5 - 15.5 %   Platelets 156 150 - 400 K/uL   nRBC 0.0 0.0 - 0.2 %  Basic metabolic panel     Status: Abnormal   Collection Time: 01/30/20  5:20 AM  Result Value Ref Range   Sodium 137 135 - 145 mmol/L   Potassium 3.8 3.5 - 5.1 mmol/L   Chloride 103 98 - 111 mmol/L   CO2 24 22 - 32 mmol/L   Glucose, Bld 205 (H) 70 - 99 mg/dL   BUN 22 8 - 23 mg/dL   Creatinine, Ser 0.73 0.44 - 1.00 mg/dL   Calcium 8.7 (L) 8.9 - 10.3 mg/dL   GFR calc non Af Amer >60 >60 mL/min   GFR calc Af Amer >60 >60 mL/min   Anion gap 10 5 - 15  Glucose, capillary     Status: Abnormal   Collection Time: 01/30/20  7:52  AM  Result Value Ref Range   Glucose-Capillary 146 (H) 70 - 99 mg/dL   Comment 1 Notify RN   Glucose, capillary     Status: Abnormal   Collection Time: 01/30/20 12:00 PM  Result Value Ref Range   Glucose-Capillary 276 (H) 70 - 99 mg/dL   Comment 1 Notify RN   Glucose, capillary     Status: Abnormal   Collection Time: 01/30/20  4:48 PM  Result Value Ref Range   Glucose-Capillary 114 (H) 70 - 99 mg/dL   Comment 1 Notify RN     DG Hip Port Unilat With Pelvis 1V Right  Result Date: 01/29/2020 CLINICAL DATA:  Right hip replacement. EXAM: DG HIP (WITH OR WITHOUT PELVIS) 1V PORT RIGHT COMPARISON:  Right hip x-ray from yesterday. FINDINGS: The right hip demonstrates a hemiarthroplasty without evidence of hardware failure or complication. There is no fracture or dislocation. The alignment is anatomic. Post-surgical changes noted in the surrounding soft tissues. IMPRESSION: Interval  right hip hemiarthroplasty without acute postoperative complication. Electronically Signed   By: Obie Dredge M.D.   On: 01/29/2020 16:16    Assessment/Plan: 1 Day Post-Op   Principal Problem:   Closed right hip fracture (HCC) Active Problems:   Type II diabetes mellitus with renal manifestations (HCC)   Benign essential HTN   UTI (urinary tract infection)   Dementia (HCC)   Hypokalemia   Fall at home, initial encounter  Patient doing well.  Pain is currently controlled.  Continue abduction pillow while in bed.  Patient is weightbearing as tolerated on the right lower extremity.  She should observe posterior hip precautions.  Patient began Lovenox today for DVT prophylaxis.  She would benefit from 40 mg daily for 2 weeks postop.  Patient will open my office in approximately 2 weeks for wound check, staple removal and x-ray.  Continue physical and occupational therapy.    Melanie Turner , MD 01/30/2020, 6:45 PM

## 2020-01-31 LAB — BASIC METABOLIC PANEL
Anion gap: 7 (ref 5–15)
BUN: 23 mg/dL (ref 8–23)
CO2: 27 mmol/L (ref 22–32)
Calcium: 8.5 mg/dL — ABNORMAL LOW (ref 8.9–10.3)
Chloride: 107 mmol/L (ref 98–111)
Creatinine, Ser: 0.7 mg/dL (ref 0.44–1.00)
GFR calc Af Amer: >60 mL/min (ref >60)
GFR calc non Af Amer: >60 mL/min (ref >60)
Glucose, Bld: 177 mg/dL — ABNORMAL HIGH (ref 70–99)
Potassium: 3.5 mmol/L (ref 3.5–5.1)
Sodium: 141 mmol/L (ref 135–145)

## 2020-01-31 LAB — GLUCOSE, CAPILLARY
Glucose-Capillary: 174 mg/dL — ABNORMAL HIGH (ref 70–99)
Glucose-Capillary: 292 mg/dL — ABNORMAL HIGH (ref 70–99)
Glucose-Capillary: 144 mg/dL — ABNORMAL HIGH (ref 70–99)
Glucose-Capillary: 122 mg/dL — ABNORMAL HIGH (ref 70–99)

## 2020-01-31 LAB — CBC
HCT: 29.8 % — ABNORMAL LOW (ref 36.0–46.0)
Hemoglobin: 9.5 g/dL — ABNORMAL LOW (ref 12.0–15.0)
MCH: 28.5 pg (ref 26.0–34.0)
MCHC: 31.9 g/dL (ref 30.0–36.0)
MCV: 89.5 fL (ref 80.0–100.0)
Platelets: 165 10*3/uL (ref 150–400)
RBC: 3.33 MIL/uL — ABNORMAL LOW (ref 3.87–5.11)
RDW: 14.4 % (ref 11.5–15.5)
WBC: 8.7 10*3/uL (ref 4.0–10.5)
nRBC: 0 % (ref 0.0–0.2)

## 2020-01-31 LAB — SURGICAL PATHOLOGY

## 2020-01-31 NOTE — TOC Progression Note (Signed)
Transition of Care [facility]) - Progression Note    Patient Details  Name: Aubrie Lucien MRN: [id number] Date of Birth: 09/03/1932  Transition of Care [facility]) CM/SW Contact  Shelbie Ammons, RN Phone Number: 01/31/2020, 12:45 PM  Clinical Narrative:   RNCM met with patient and daughter at bedside, daughter chooses [facility]. Contacting [employer] to determine if she is managed by [facility] or not.     Expected Discharge Plan: [place] Barriers to Discharge: No Barriers Identified  Expected Discharge Plan and Services Expected Discharge Plan: [place]   Discharge Planning Services: CM Consult Post Acute Care Choice: [facility] Living arrangements for the past 2 months: Single Family Home                                       Social Determinants of Health (SDOH) Interventions    Readmission Risk Interventions No flowsheet data found.

## 2020-01-31 NOTE — Progress Notes (Signed)
PROGRESS NOTE    Melanie Turner  VCB:[id number]  DOB: 1932-01-19  DOA: 01/28/2020 PCP: Kirk Ruths, MD Outpatient Specialists:   Hospital course:  Melanie Turner is a 86 y.o. female with medical history significant of dementia, hypertension, diabetes mellitus, depression, one-time seizure, who was admitted 01/27/1930 with right hip fracture status post mechanical fall although work-up does reveal UTI.  X-ray reveals subcapital femoral neck fracture with impaction and varus angulation.  Subjective:  Patient was in great spirits sitting up in chair eating breakfast with her daughter at bedside.  She states she is doing well.  Denies any pain.  States she has a good appetite. .  Objective: Vitals:   01/30/20 2231 01/31/20 0750 01/31/20 1645 01/31/20 1752  BP: 133/74 129/87 (!) 186/83 (!) 157/88  Pulse: 94 85 (!) 111 100  Resp: 16 16 16    Temp: 99 F (37.2 C) 98.3 F (36.8 C) 99.2 F (37.3 C)   TempSrc: Oral Oral Oral   SpO2: 97% 100% 100%   Weight:      Height:       No intake or output data in the 24 hours ending 01/31/20 1855 Filed Weights   01/28/20 1134  Weight: 54.4 kg     Assessment & Plan:   86 year old female admitted with right hip fracture status post mechanical fall.  Incidentally found to have low-grade UTI.  Closed right hip fracture Patient being followed by orthopedics for surgery in the morning. Pain management and DVT prophylaxis per orthopedics. PT OT per orthopedics.  UTI (urinary tract infection) She has completed a 3-day course of Rocephin  Hypokalemia Normalized with repletion  Type II diabetes mellitus with renal manifestations  Under reasonable but not optimal control however given lability, would not want to try to tighten coverage at this point as low blood sugar would be more detrimental than intermittent high ones. Most recent A1c 11.4, poorly controled.  SSI AC at bedtime Hold Metformin   HTN Blood pressure under  good control with amlodipine 2.5 which was started on this admission.  Dementia  Continue donepezil   DVT prophylaxis: Lovenox Code Status: DNR Family Communication: Spoke with patient's son who was at bedside today.  He had no further questions. Disposition Plan:   Patient is from: Home  Anticipated Discharge Location: Rehab  Barriers to Discharge: Status post surgery this morning  Is patient medically stable for Discharge: Yes when orthopedics feels she is ready to go.   Consultants:  Orthopedics  Procedures:  Right hemiarthroplasty of hip  Antimicrobials:  Rocephin   Exam:  General: Pleasantly demented thin female in good spirits sitting up in chair eating breakfast with son at bedside. Eyes: sclera anicteric, conjuctiva mild injection bilaterally CVS: S1-S2, regular  Respiratory:  decreased air entry bilaterally secondary to decreased inspiratory effort, rales at bases  GI: NABS, soft, NT  LE: No edema.    Data Reviewed: Basic Metabolic Panel: Recent Labs  Lab 01/28/20 1157 01/29/20 0445 01/30/20 0520 01/31/20 0534  NA 137 140 137 141  K 3.3* 3.2* 3.8 3.5  CL 98 104 103 107  CO2 26 26 24 27   GLUCOSE 299* 184* 205* 177*  BUN 20 16 22 23   CREATININE 0.94 0.64 0.73 0.70  CALCIUM 9.7 9.1 8.7* 8.5*  MG  --  1.9  --   --    Liver Function Tests: Recent Labs  Lab 01/28/20 1157  AST 37  ALT 23  ALKPHOS 46  BILITOT 1.6*  PROT 7.9  ALBUMIN 4.1   No results for input(s): LIPASE, AMYLASE in the last 168 hours. No results for input(s): AMMONIA in the last 168 hours. CBC: Recent Labs  Lab 01/28/20 1157 01/29/20 0445 01/30/20 0520 01/31/20 0534  WBC 8.4 8.8 8.0 8.7  HGB 12.1 12.1 10.3* 9.5*  HCT 36.9 36.5 32.1* 29.8*  MCV 87.0 85.7 88.4 89.5  PLT 178 151 156 165   Cardiac Enzymes: No results for input(s): CKTOTAL, CKMB, CKMBINDEX, TROPONINI in the last 168 hours. BNP (last 3 results) No results for input(s): PROBNP in the last 8760  hours. CBG: Recent Labs  Lab 01/30/20 1648 01/30/20 2135 01/31/20 0753 01/31/20 1208 01/31/20 1649  GLUCAP 114* 179* 174* 292* 144*    Recent Results (from the past 240 hour(s))  Urine Culture     Status: None   Collection Time: 01/28/20  1:13 PM   Specimen: Urine, Random  Result Value Ref Range Status   Specimen Description   Final    URINE, RANDOM Performed at [facility], [address] [place], [place] [address]    Special Requests   Final    NONE Performed at [facility], [address] [place], [place] [address]    Culture   Final    NO GROWTH Performed at [facility], [address], [place], [place] [address]    Report Status 01/29/2020 FINAL  Final  Respiratory Panel by RT PCR (Flu A&B, Covid) - Nasopharyngeal Swab     Status: None   Collection Time: 01/28/20  1:42 PM   Specimen: Nasopharyngeal Swab  Result Value Ref Range Status   SARS Coronavirus 2 by RT PCR NEGATIVE NEGATIVE Final    Comment: (NOTE) SARS-CoV-2 target nucleic acids are NOT DETECTED. The SARS-CoV-2 RNA is generally detectable in upper respiratoy specimens during the acute phase of infection. The lowest concentration of SARS-CoV-2 viral copies this assay can detect is 131 copies/mL. A negative result does not preclude SARS-Cov-2 infection and should not be used as the sole basis for treatment or other patient management decisions. A negative result may occur with  improper specimen collection/handling, submission of specimen other than nasopharyngeal swab, presence of viral mutation(s) within the areas targeted by this assay, and inadequate number of viral copies (<131 copies/mL). A negative result must be combined with clinical observations, patient history, and epidemiological information. The expected result is Negative. Fact Sheet for Patients:  https://www.moore.com/ Fact Sheet for Healthcare Providers:   https://www.young.biz/ This test is not yet ap proved or cleared by the [place] FDA and  has been authorized for detection and/or diagnosis of SARS-CoV-2 by FDA under an Emergency Use Authorization (EUA). This EUA will remain  in effect (meaning this test can be used) for the duration of the COVID-19 declaration under Section 564(b)(1) of the Act, 21 U.S.C. section 360bbb-3(b)(1), unless the authorization is terminated or revoked sooner.    Influenza A by PCR NEGATIVE NEGATIVE Final   Influenza B by PCR NEGATIVE NEGATIVE Final    Comment: (NOTE) The Xpert Xpress SARS-CoV-2/FLU/RSV assay is intended as an aid in  the diagnosis of influenza from Nasopharyngeal swab specimens and  should not be used as a sole basis for treatment. Nasal washings and  aspirates are unacceptable for Xpert Xpress SARS-CoV-2/FLU/RSV  testing. Fact Sheet for Patients: https://www.moore.com/ Fact Sheet for Healthcare Providers: https://www.young.biz/ This test is not yet approved or cleared by the [place] FDA and  has been authorized for detection and/or diagnosis of SARS-CoV-2  by  FDA under an Emergency Use Authorization (EUA). This EUA will remain  in effect (meaning this test can be used) for the duration of the  Covid-19 declaration under Section 564(b)(1) of the Act, 21  U.S.C. section 360bbb-3(b)(1), unless the authorization is  terminated or revoked. Performed at [facility], [address] [place], [place] [address]   Surgical pcr screen     Status: None   Collection Time: 01/29/20  3:35 AM   Specimen: Nasal Mucosa; Nasal Swab  Result Value Ref Range Status   MRSA, PCR NEGATIVE NEGATIVE Final   Staphylococcus aureus NEGATIVE NEGATIVE Final    Comment: (NOTE) The Xpert SA Assay (FDA approved for NASAL specimens in patients 59 years of age and older), is one component of a comprehensive surveillance program. It is  not intended to diagnose infection nor to guide or monitor treatment. Performed at [facility], [address] [place], [place] [address]       Studies: No results found.   Scheduled Meds: . amLODipine  2.5 mg Oral Daily  . Chlorhexidine Gluconate Cloth  6 each Topical Daily  . docusate sodium  100 mg Oral BID  . donepezil  5 mg Oral Daily  . enoxaparin (LOVENOX) injection  40 mg Subcutaneous Q24H  . feeding supplement (ENSURE ENLIVE)  237 mL Oral BID BM  . insulin aspart  0-15 Units Subcutaneous TID WC  . insulin aspart  0-5 Units Subcutaneous QHS  . mirtazapine  7.5 mg Oral Daily  . multivitamin with minerals  1 tablet Oral Daily  . senna  1 tablet Oral BID  . traMADol  50 mg Oral Q6H   Continuous Infusions: . sodium chloride Stopped (01/30/20 1705)    Principal Problem:   Closed right hip fracture (HCC) Active Problems:   Type II diabetes mellitus with renal manifestations (HCC)   Benign essential HTN   UTI (urinary tract infection)   Dementia (HCC)   Hypokalemia   Fall at home, initial encounter     Pieter Partridge, [facility]  If 7PM-7AM, please contact night-coverage [email or web address] Password [facility] 01/31/2020, 6:55 PM    LOS: 3 days

## 2020-01-31 NOTE — Progress Notes (Signed)
Subjective:  POD #2 s/p right hip hemiarthroplasty.   Patient reports right hip pain as mild to moderate.  Patient is up out of bed to a chair.  Her daughter is in the room.  Objective:   VITALS:   Vitals:   01/30/20 1158 01/30/20 1617 01/30/20 2231 01/31/20 0750  BP: 135/60 (!) 160/83 133/74 129/87  Pulse: 100 95 94 85  Resp: 18 18 16 16   Temp: (!) 97.4 F (36.3 C) 99.7 F (37.6 C) 99 F (37.2 C) 98.3 F (36.8 C)  TempSrc: Oral Oral Oral Oral  SpO2: 97% 95% 97% 100%  Weight:      Height:        PHYSICAL EXAM: Right lower extremity Neurovascular intact Sensation intact distally Intact pulses distally Dorsiflexion/Plantar flexion intact Incision: scant drainage No cellulitis present Compartment soft  LABS  Results for orders placed or performed during the hospital encounter of 01/28/20 (from the past 24 hour(s))  Glucose, capillary     Status: Abnormal   Collection Time: 01/30/20  4:48 PM  Result Value Ref Range   Glucose-Capillary 114 (H) 70 - 99 mg/dL   Comment 1 Notify RN   Glucose, capillary     Status: Abnormal   Collection Time: 01/30/20  9:35 PM  Result Value Ref Range   Glucose-Capillary 179 (H) 70 - 99 mg/dL  CBC     Status: Abnormal   Collection Time: 01/31/20  5:34 AM  Result Value Ref Range   WBC 8.7 4.0 - 10.5 K/uL   RBC 3.33 (L) 3.87 - 5.11 MIL/uL   Hemoglobin 9.5 (L) 12.0 - 15.0 g/dL   HCT 04/01/20 (L) 25.4 - 27.0 %   MCV 89.5 80.0 - 100.0 fL   MCH 28.5 26.0 - 34.0 pg   MCHC 31.9 30.0 - 36.0 g/dL   RDW 62.3 76.2 - 83.1 %   Platelets 165 150 - 400 K/uL   nRBC 0.0 0.0 - 0.2 %  Basic metabolic panel     Status: Abnormal   Collection Time: 01/31/20  5:34 AM  Result Value Ref Range   Sodium 141 135 - 145 mmol/L   Potassium 3.5 3.5 - 5.1 mmol/L   Chloride 107 98 - 111 mmol/L   CO2 27 22 - 32 mmol/L   Glucose, Bld 177 (H) 70 - 99 mg/dL   BUN 23 8 - 23 mg/dL   Creatinine, Ser 04/01/20 0.44 - 1.00 mg/dL   Calcium 8.5 (L) 8.9 - 10.3 mg/dL   GFR calc  non Af Amer >60 >60 mL/min   GFR calc Af Amer >60 >60 mL/min   Anion gap 7 5 - 15  Glucose, capillary     Status: Abnormal   Collection Time: 01/31/20  7:53 AM  Result Value Ref Range   Glucose-Capillary 174 (H) 70 - 99 mg/dL   Comment 1 Notify RN    Comment 2 Document in Chart   Glucose, capillary     Status: Abnormal   Collection Time: 01/31/20 12:08 PM  Result Value Ref Range   Glucose-Capillary 292 (H) 70 - 99 mg/dL   Comment 1 Notify RN    Comment 2 Document in Chart     DG Hip Port Unilat With Pelvis 1V Right  Result Date: 01/29/2020 CLINICAL DATA:  Right hip replacement. EXAM: DG HIP (WITH OR WITHOUT PELVIS) 1V PORT RIGHT COMPARISON:  Right hip x-ray from yesterday. FINDINGS: The right hip demonstrates a hemiarthroplasty without evidence of hardware failure or complication. There is  no fracture or dislocation. The alignment is anatomic. Post-surgical changes noted in the surrounding soft tissues. IMPRESSION: Interval right hip hemiarthroplasty without acute postoperative complication. Electronically Signed   By: Titus Dubin M.D.   On: 01/29/2020 16:16    Assessment/Plan: 2 Days Post-Op   Principal Problem:   Closed right hip fracture (HCC) Active Problems:   Type II diabetes mellitus with renal manifestations (HCC)   Benign essential HTN   UTI (urinary tract infection)   Dementia ([place])   Hypokalemia   Fall at home, initial encounter  Continue physical and Occupational Therapy.  Continue Lovenox for DVT prophylaxis.  Patient should remain on Lovenox 40 mg daily for 2 weeks after discharge until follow-up in the orthopedic office.  Patient is doing well from orthopedic standpoint may be discharged to skilled nursing facility whenever cleared by medicine.    Thornton Park , MD 01/31/2020, 1:53 PM

## 2020-01-31 NOTE — Progress Notes (Signed)
Physical Therapy Treatment Patient Details Name: Melanie Turner MRN: [id number] DOB: 06-02-1932 Today's Date: 01/31/2020    History of Present Illness Pt is an 86 y.o. female presenting to hospital 01/28/20 with difficulty walking after a fall.  Pt noted with R subcapital femoral neck fx with impaction and varus angulation at fx site.  Pt s/p R hip hemiarthroplasty 01/29/20 with posterolateral approach.  PMH includes dementia, DM, htn, seizure, thyroidectomy.    PT Comments    Pt was seated in recliner asleep with daughter present upon therapist arriving. She requested to get back to bed and does report pain however unable to rate 2/2 to cognition deficits.  Resting HR 97bpm that elevated to 150s with standing and transferring back to bed. RN aware. Therapist reviewed hip precautions with pt/pt's daughter. Will need reinforcement 2/2 to baseline cognition. She was able to stand with mod assist but required max assist to stand and return to bed. Increased time and max vcs for safety and technique. She required max assist to slide up in bed after returning to supine. Therapist recommends DC to SNF and daughter is in agreement. Acute PT to continue to follow per POC addressing deficits with strength, balance, and safe functional mobility. Bed alarm in place and daughter at bedside. Rn aware of HR concerns with activity.    Follow Up Recommendations  SNF     Equipment Recommendations  Rolling walker with 5" wheels;3in1 (PT);Wheelchair (measurements PT);Wheelchair cushion (measurements PT)    Recommendations for Other Services OT consult     Precautions / Restrictions Precautions Precautions: Posterior Hip;Fall Precaution Booklet Issued: Yes (comment) Restrictions Weight Bearing Restrictions: Yes RLE Weight Bearing: Weight bearing as tolerated    Mobility  Bed Mobility Overal bed mobility: Needs Assistance Bed Mobility: Sit to Supine     Supine to sit: Max assist;HOB elevated Sit to  supine: HOB elevated;Max assist   General bed mobility comments: pt required max assist + max vcs for technique to return to supine from EOB short sit  Transfers Overall transfer level: Needs assistance Equipment used: Rolling walker (2 wheeled);None Transfers: Sit to/from Stand Sit to Stand: Mod assist Stand pivot transfers: Max assist;+2 safety/equipment       General transfer comment: pt performed STS 2 x from recliner prior to stand pivot back to bed from recliner. She required mod assist to stand but max assist to safely stand pivot. Vcs throughout for hand placement and LE progression. HR elevation with standing. HR elevated to 150 bpm but resolves to 108bpm with rest.  Ambulation/Gait Ambulation/Gait assistance: +2 physical assistance Gait Distance (Feet): 3 Feet Assistive device: Rolling walker (2 wheeled)   Gait velocity: decraesed   General Gait Details: pt has diffulty progressing LEs 2/2 to pain/weakness. Max vcs during pivot/stepping to EOB from [employer] stage [employer] Rankin (Stroke Patients Only)       Balance Overall balance assessment: Needs assistance Sitting-balance support: Bilateral upper extremity supported;Feet supported Sitting balance-[name] Scale: Poor Sitting balance - Comments: pt requiring UE support for static sitting balance (mild lean towards L d/t R hip/thigh pain) Postural control: Left lateral lean Standing balance support: Bilateral upper extremity supported Standing balance-[name] Scale: Poor Standing balance comment: pt requiring B UE support on RW and 1 assist to maintain standing balance  Cognition Arousal/Alertness: Awake/alert Behavior During Therapy: WFL for tasks assessed/performed Overall Cognitive Status: History of cognitive impairments - at baseline                                 General Comments: Oriented to self and knows  she hurt her R leg      Exercises Total Joint Exercises Ankle Circles/Pumps: AROM;Strengthening;Both;10 reps;Supine Short Arc Quad: AAROM;Strengthening;Right;10 reps;Supine Heel Slides: AAROM;Strengthening;Right;10 reps;Supine Hip ABduction/ADduction: AAROM;Strengthening;Right;10 reps;Supine    General Comments General comments (skin integrity, edema, etc.): Mild drainage noted at R hip/thigh honeycomb dressing      Pertinent Vitals/Pain Pain Assessment: Faces Faces Pain Scale: Hurts even more Pain Location: R hip-with movement Pain Descriptors / Indicators: Operative site guarding;Tender;Grimacing;Guarding Pain Intervention(s): Limited activity within patient's tolerance;Monitored during session;Repositioned;Premedicated before session    Home Living                      Prior Function            PT Goals (current goals can now be found in the care plan section) Acute Rehab PT Goals Patient Stated Goal: " I want to move without it hurting" PT Goal Formulation: With patient Time For Goal Achievement: 02/13/20 Potential to Achieve Goals: Good Progress towards PT goals: Progressing toward goals    Frequency    BID      PT Plan Current plan remains appropriate    Co-evaluation              AM-PAC PT "6 Clicks" Mobility   Outcome Measure  Help needed turning from your back to your side while in a flat bed without using bedrails?: A Lot Help needed moving from lying on your back to sitting on the side of a flat bed without using bedrails?: A Lot Help needed moving to and from a bed to a chair (including a wheelchair)?: A Lot Help needed standing up from a chair using your arms (e.g., wheelchair or bedside chair)?: A Lot Help needed to walk in hospital room?: Total Help needed climbing 3-5 steps with a railing? : Total 6 Click Score: 10    End of Session Equipment Utilized During Treatment: Gait belt Activity Tolerance: Patient tolerated treatment  well Patient left: in bed;with bed alarm set;with call bell/phone within reach;with family/visitor present Nurse Communication: Mobility status;Precautions;Weight bearing status;Other (comment) PT Visit Diagnosis: Other abnormalities of gait and mobility (R26.89);Muscle weakness (generalized) (M62.81);History of falling (Z91.81);Difficulty in walking, not elsewhere classified (R26.2);Pain Pain - Right/Left: Right Pain - part of body: Hip     Time: 0037-0488 PT Time Calculation (min) (ACUTE ONLY): 17 min  Charges:  $Therapeutic Exercise: 8-22 mins $Therapeutic Activity: 8-22 mins                     Jetta Lout PTA 01/31/20, 2:35 PM

## 2020-01-31 NOTE — Care Management Important Message (Signed)
Important Message  Patient Details  Name: Melanie Turner MRN: [id number] Date of Birth: 05-Feb-1932   Medicare Important Message Given:  Yes     [name] A Adaisha Campise 01/31/2020, 10:55 AM

## 2020-01-31 NOTE — Care Management Important Message (Signed)
Important Message  Patient Details  Name: Melanie Turner MRN: [id number] Date of Birth: February 07, 1932   Medicare Important Message Given:        Verita Schneiders Lancelot Alyea 01/31/2020, 10:55 AM

## 2020-01-31 NOTE — Progress Notes (Signed)
Physical Therapy Treatment Patient Details Name: Melanie Turner MRN: [id number] DOB: [birth year] Today's Date: 01/31/2020    History of Present Illness Pt is an 86 y.o. female presenting to hospital 01/28/20 with difficulty walking after a fall.  Pt noted with R subcapital femoral neck fx with impaction and varus angulation at fx site.  Pt s/p R hip hemiarthroplasty 01/29/20 with posterolateral approach.  PMH includes dementia, DM, htn, seizure, thyroidectomy.    PT Comments    Pt resting in bed upon PT arrival.  Tolerated LE ex's fairly well with assist.  Max assist x1 semi-supine to sitting edge of bed; max assist stand pivot bed to recliner; and mod to max assist sit to stand from recliner up to RW (vc's and assist required for upright posture).  Pt's HR noted to increase from 97 bpm at rest to 138 bpm with transfers (HR decreased to 102 bpm with sitting rest break); nurse notified regarding pt's HR during session.  Will continue to focus on strengthening and progressive functional mobility per pt tolerance.    Follow Up Recommendations  SNF     Equipment Recommendations  Rolling walker with 5" wheels;3in1 (PT);Wheelchair (measurements PT);Wheelchair cushion (measurements PT)    Recommendations for Other Services OT consult     Precautions / Restrictions Precautions Precautions: Posterior Hip;Fall Precaution Booklet Issued: Yes (comment) Restrictions Weight Bearing Restrictions: Yes RLE Weight Bearing: Weight bearing as tolerated    Mobility  Bed Mobility Overal bed mobility: Needs Assistance Bed Mobility: Supine to Sit     Supine to sit: Max assist;HOB elevated     General bed mobility comments: assist for trunk and B LE's; vc's for technique  Transfers Overall transfer level: Needs assistance Equipment used: Rolling walker (2 wheeled);None Transfers: Sit to/from [facility] Sit to Stand: Mod assist;Max assist Stand pivot transfers: Max assist        General transfer comment: max assist stand pivot bed to recliner to L; mod to max assist to stand up to RW; vc's and tactile cues for UE/LE placement and overal technique; vc's and assist for posterior hip precautions; assist to initiate and come to almost full stand  Ambulation/Gait             General Gait Details: pt unable to take any steps with max cueing, RW use, and 1 assist   Stairs             Wheelchair Mobility    Modified Rankin (Stroke Patients Only)       Balance Overall balance assessment: Needs assistance Sitting-balance support: Bilateral upper extremity supported;Feet supported Sitting balance-[name] Scale: Poor Sitting balance - Comments: pt requiring UE support for static sitting balance (mild lean towards L d/t R hip/thigh pain) Postural control: Left lateral lean Standing balance support: Bilateral upper extremity supported Standing balance-[name] Scale: Poor Standing balance comment: pt requiring B UE support on RW and 1 assist to maintain standing balance                            Cognition Arousal/Alertness: Awake/alert Behavior During Therapy: WFL for tasks assessed/performed Overall Cognitive Status: History of cognitive impairments - at baseline                                 General Comments: Oriented to name and DOB      Exercises Total Joint Exercises Ankle Circles/Pumps: AROM;Strengthening;Both;10  reps;Supine Short Arc Quad: AAROM;Strengthening;Right;10 reps;Supine Heel Slides: AAROM;Strengthening;Right;10 reps;Supine Hip ABduction/ADduction: AAROM;Strengthening;Right;10 reps;Supine    General Comments General comments (skin integrity, edema, etc.): Mild drainage noted at R hip/thigh honeycomb dressing.  Pt agreeable to PT session.      Pertinent Vitals/Pain Pain Assessment: Faces Faces Pain Scale: (4/10 with activity; 0/10 at rest) Pain Location: R hip-with movement Pain Descriptors / Indicators:  Operative site guarding;Tender;Grimacing;Guarding Pain Intervention(s): Limited activity within patient's tolerance;Monitored during session;Repositioned  O2 sats WFL on room air during sessions activities.    Home Living                      Prior Function            PT Goals (current goals can now be found in the care plan section) Acute Rehab PT Goals Patient Stated Goal: to improve mobility PT Goal Formulation: With patient Time For Goal Achievement: 02/13/20 Potential to Achieve Goals: Good Progress towards PT goals: Progressing toward goals    Frequency    BID      PT Plan Current plan remains appropriate    Co-evaluation              AM-PAC PT "6 Clicks" Mobility   Outcome Measure  Help needed turning from your back to your side while in a flat bed without using bedrails?: A Lot Help needed moving from lying on your back to sitting on the side of a flat bed without using bedrails?: A Lot Help needed moving to and from a bed to a chair (including a wheelchair)?: A Lot Help needed standing up from a chair using your arms (e.g., wheelchair or bedside chair)?: A Lot Help needed to walk in hospital room?: Total Help needed climbing 3-5 steps with a railing? : Total 6 Click Score: 10    End of Session Equipment Utilized During Treatment: Gait belt Activity Tolerance: Patient tolerated treatment well Patient left: in chair;with call bell/phone within reach;with chair alarm set;with SCD's reapplied;Other (comment)(pillows between pt's knees (for posterior hip precautions) and to float B heels) Nurse Communication: Mobility status;Precautions;Weight bearing status;Other (comment)(pt's HR during session) PT Visit Diagnosis: Other abnormalities of gait and mobility (R26.89);Muscle weakness (generalized) (M62.81);History of falling (Z91.81);Difficulty in walking, not elsewhere classified (R26.2);Pain Pain - Right/Left: Right Pain - part of body: Hip      Time: 5027-7412 PT Time Calculation (min) (ACUTE ONLY): 38 min  Charges:  $Therapeutic Exercise: 8-22 mins $Therapeutic Activity: 23-37 mins                     Hendricks Limes, PT 01/31/20, 12:48 PM

## 2020-02-01 LAB — CBC
HCT: 28.2 % — ABNORMAL LOW (ref 36.0–46.0)
Hemoglobin: 8.9 g/dL — ABNORMAL LOW (ref 12.0–15.0)
MCH: 28.4 pg (ref 26.0–34.0)
MCHC: 31.6 g/dL (ref 30.0–36.0)
MCV: 90.1 fL (ref 80.0–100.0)
Platelets: 188 10*3/uL (ref 150–400)
RBC: 3.13 MIL/uL — ABNORMAL LOW (ref 3.87–5.11)
RDW: 14.2 % (ref 11.5–15.5)
WBC: 8 10*3/uL (ref 4.0–10.5)
nRBC: 0 % (ref 0.0–0.2)

## 2020-02-01 LAB — GLUCOSE, CAPILLARY
Glucose-Capillary: 163 mg/dL — ABNORMAL HIGH (ref 70–99)
Glucose-Capillary: 255 mg/dL — ABNORMAL HIGH (ref 70–99)
Glucose-Capillary: 122 mg/dL — ABNORMAL HIGH (ref 70–99)
Glucose-Capillary: 99 mg/dL (ref 70–99)

## 2020-02-01 NOTE — Progress Notes (Signed)
Physical Therapy Treatment Patient Details Name: Melanie Turner MRN: [id number] DOB: 1931/12/31 Today's Date: 02/01/2020    History of Present Illness Pt is an 86 y.o. female presenting to hospital 01/28/20 with difficulty walking after a fall.  Pt noted with R subcapital femoral neck fx with impaction and varus angulation at fx site.  Pt s/p R hip hemiarthroplasty 01/29/20 with posterolateral approach.  PMH includes dementia, DM, htn, seizure, thyroidectomy.    PT Comments    Pt was long sitting in bed upon therapist arriving. History of dementia at baseline. She was alert throughout session and cooperative. Breakfast tray untouched at bedside. Pt was able to follow commands throughout. Unable to rate pain but present with all mobility/movements. She agrees to OOB to recliner. INcraesed time to perform all mobility, transfers, and gait with cueing throughout for safety and technique. Pt stood 2 x EOB prior to standing and taking steps to recliner. Max assist to ambulate 3 ft to recliner with gait belt and RW used. Pt was repositioned in recliner with call bell in reach, ice applied, chair alarm in place, and RN aware of pt's abilities. Overall she tolerated session well and is progressing with PT. Therapist recommends DC to SNF to address deficits and assist pt with returning to PLOF.    Follow Up Recommendations  SNF     Equipment Recommendations  Rolling walker with 5" wheels;3in1 (PT);Wheelchair (measurements PT);Wheelchair cushion (measurements PT)    Recommendations for Other Services       Precautions / Restrictions Precautions Precautions: Posterior Hip;Fall Precaution Booklet Issued: Yes (comment) Restrictions Weight Bearing Restrictions: Yes RLE Weight Bearing: Weight bearing as tolerated    Mobility  Bed Mobility Overal bed mobility: Needs Assistance Bed Mobility: Supine to Sit     Supine to sit: Max assist;HOB elevated     General bed mobility comments: Max assist to  progress from long sitting to short sit EOB with vcs and tactile cues throughout for technique and sequencing. Pt sat EOB with HR elevation to 115bpm  Transfers Overall transfer level: Needs assistance Equipment used: Rolling walker (2 wheeled);None Transfers: Sit to/from Stand Sit to Stand: Mod assist         General transfer comment: pt stood 2 x EOB (elevated) prior to standing and taking 3 steps to recliner. Mod assist to stand but max assist during steps to recliner  Ambulation/Gait Ambulation/Gait assistance: Max assist Gait Distance (Feet): 3 Feet Assistive device: Rolling walker (2 wheeled) Gait Pattern/deviations: Antalgic;Step-to pattern Gait velocity: decraesed   General Gait Details: pt very slow with max assist required to get to recliner. She was able to progress LEs with assistance with lateral wt shift and vcs for UE support.   Stairs             Wheelchair Mobility    Modified Rankin (Stroke Patients Only)       Balance Overall balance assessment: Needs assistance Sitting-balance support: Bilateral upper extremity supported;Feet supported Sitting balance-[name] Scale: Fair   Postural control: Left lateral lean Standing balance support: Bilateral upper extremity supported Standing balance-[name] Scale: Poor Standing balance comment: pt requiring B UE support on RW and 1 assist to maintain standing balance                            Cognition Arousal/Alertness: Awake/alert Behavior During Therapy: WFL for tasks assessed/performed Overall Cognitive Status: History of cognitive impairments - at baseline  General Comments: pt was Alert throughout and able to follow commands well however is not oriented.      Exercises      General Comments        Pertinent Vitals/Pain Pain Assessment: Faces Faces Pain Scale: Hurts little more Pain Location: R hip-with movement Pain Descriptors /  Indicators: Operative site guarding;Tender;Grimacing;Guarding Pain Intervention(s): Limited activity within patient's tolerance;Monitored during session;Premedicated before session;Repositioned;Ice applied    Home Living                      Prior Function            PT Goals (current goals can now be found in the care plan section) Acute Rehab PT Goals Patient Stated Goal: did not state goal this session Progress towards PT goals: Progressing toward goals    Frequency    BID      PT Plan Current plan remains appropriate    Co-evaluation              AM-PAC PT "6 Clicks" Mobility   Outcome Measure  Help needed turning from your back to your side while in a flat bed without using bedrails?: A Lot Help needed moving from lying on your back to sitting on the side of a flat bed without using bedrails?: A Lot Help needed moving to and from a bed to a chair (including a wheelchair)?: A Lot Help needed standing up from a chair using your arms (e.g., wheelchair or bedside chair)?: A Lot Help needed to walk in hospital room?: A Lot Help needed climbing 3-5 steps with a railing? : Total 6 Click Score: 11    End of Session Equipment Utilized During Treatment: Gait belt Activity Tolerance: Patient tolerated treatment well Patient left: in chair;with call bell/phone within reach;with chair alarm set Nurse Communication: Mobility status PT Visit Diagnosis: Other abnormalities of gait and mobility (R26.89);Muscle weakness (generalized) (M62.81);History of falling (Z91.81);Difficulty in walking, not elsewhere classified (R26.2);Pain Pain - Right/Left: Right Pain - part of body: Hip     Time: 6712-4580 PT Time Calculation (min) (ACUTE ONLY): 18 min  Charges:  $Therapeutic Activity: 8-22 mins                     Jetta Lout PTA 02/01/20, 12:49 PM

## 2020-02-01 NOTE — Progress Notes (Signed)
Physical Therapy Treatment Patient Details Name: Melanie Turner MRN: [id number] DOB: 1932/05/21 Today's Date: 02/01/2020    History of Present Illness Pt is an 86 y.o. female presenting to hospital 01/28/20 with difficulty walking after a fall.  Pt noted with R subcapital femoral neck fx with impaction and varus angulation at fx site.  Pt s/p R hip hemiarthroplasty 01/29/20 with posterolateral approach.  PMH includes dementia, DM, htn, seizure, thyroidectomy.    PT Comments    Pt was still seated in recliner from AM session. She is more lethargic this afternoon but cooperative. Continues to be disoriented but was able to follow simple one step commands with increased time however struggles with multi-step commands. Son present throughout session and very pleasant. He was educated on precautions and states he will assist pt with adhering to them. Pt required max assist to stand and max assist to return to bed. Pt presents with increased pain this PM versus this morning session. She fatigued quickly with HR elevated to 119 bpm during transfers. Pt was repositioned to [facility] post session with call bell in reach, bed alarm set, and son still at bedside. Therapist continues to recommend DC to SNF when stable. Acute PT will continue to follow per current POC.   Follow Up Recommendations  SNF     Equipment Recommendations  Rolling walker with 5" wheels;3in1 (PT);Wheelchair (measurements PT);Wheelchair cushion (measurements PT)    Recommendations for Other Services       Precautions / Restrictions Precautions Precautions: Posterior Hip;Fall Precaution Booklet Issued: Yes (comment) Restrictions Weight Bearing Restrictions: Yes RLE Weight Bearing: Weight bearing as tolerated    Mobility  Bed Mobility Overal bed mobility: Needs Assistance Bed Mobility: Sit to Supine       Sit to supine: Max assist;HOB elevated   General bed mobility comments: Max assist to return to supine from EOB sitting.  INcraesed time to perform 2/2 to cognition deficits. pt struggles with following multistep commands.  Transfers Overall transfer level: Needs assistance Equipment used: Rolling walker (2 wheeled);None Transfers: Sit to/from Stand Sit to Stand: Max assist         General transfer comment: Max assist to stand from lower recliner height. She required increased time and max vcs for hand placement and technique. tolerated standing ~ 30 sec prior to sitting back into recliner. She stood a 2nd trial and required max assist to progress to EOB. HR elevated to 119 bpm. Pt much more fatigued thias afternoon versus AM session.   Ambulation/Gait                 Stairs             Wheelchair Mobility    Modified Rankin (Stroke Patients Only)       Balance Overall balance assessment: Needs assistance Sitting-balance support: Feet supported;Bilateral upper extremity supported Sitting balance-[name] Scale: Fair     Standing balance support: Bilateral upper extremity supported Standing balance-[name] Scale: Poor                              Cognition Arousal/Alertness: Awake/alert Behavior During Therapy: WFL for tasks assessed/performed Overall Cognitive Status: History of cognitive impairments - at baseline                                 General Comments: Pt more lethargic this afternoon but was cooperative. She demonstrates more  pain this PM versus morning session. Pt's son in room throughout session      Exercises      General Comments        Pertinent Vitals/Pain Pain Assessment: Faces Faces Pain Scale: Hurts even more Pain Location: R hip-with movement Pain Descriptors / Indicators: Operative site guarding;Tender;Grimacing;Guarding Pain Intervention(s): Limited activity within patient's tolerance;Monitored during session;Repositioned    Home Living                      Prior Function            PT Goals (current goals  can now be found in the care plan section) Acute Rehab PT Goals Patient Stated Goal: " I want to move better" Progress towards PT goals: Progressing toward goals    Frequency    BID      PT Plan Current plan remains appropriate    Co-evaluation              AM-PAC PT "6 Clicks" Mobility   Outcome Measure  Help needed turning from your back to your side while in a flat bed without using bedrails?: A Lot Help needed moving from lying on your back to sitting on the side of a flat bed without using bedrails?: A Lot Help needed moving to and from a bed to a chair (including a wheelchair)?: A Lot Help needed standing up from a chair using your arms (e.g., wheelchair or bedside chair)?: A Lot Help needed to walk in hospital room?: A Lot Help needed climbing 3-5 steps with a railing? : Total 6 Click Score: 11    End of Session Equipment Utilized During Treatment: Gait belt Activity Tolerance: Patient tolerated treatment well Patient left: in chair;with call bell/phone within reach;with chair alarm set Nurse Communication: Mobility status PT Visit Diagnosis: Other abnormalities of gait and mobility (R26.89);Muscle weakness (generalized) (M62.81);History of falling (Z91.81);Difficulty in walking, not elsewhere classified (R26.2);Pain Pain - Right/Left: Right Pain - part of body: Hip     Time: 3159-4585 PT Time Calculation (min) (ACUTE ONLY): 18 min  Charges:  $Therapeutic Activity: 8-22 mins                    Julaine Fusi PTA 02/01/20, 2:45 PM

## 2020-02-01 NOTE — Progress Notes (Signed)
  Subjective:  Patient reports pain as mild.  Family in room.  Objective:   VITALS:   Vitals:   01/31/20 2032 02/01/20 0011 02/01/20 0625 02/01/20 0803  BP: (!) 156/85 134/78 (!) 144/78 (!) 162/86  Pulse: 95 87 84 86  Resp: 17 17 18 14   Temp: 99.2 F (37.3 C) 99.2 F (37.3 C) 99.3 F (37.4 C) 98 F (36.7 C)  TempSrc: Oral Oral Oral   SpO2: 98% 99% 99% 100%  Weight:      Height:        PHYSICAL EXAM:  Neurovascular intact Dorsiflexion/Plantar flexion intact Incision: dressing C/D/I No cellulitis present Compartment soft  LABS  Results for orders placed or performed during the hospital encounter of 01/28/20 (from the past 24 hour(s))  Glucose, capillary     Status: Abnormal   Collection Time: 01/31/20  4:49 PM  Result Value Ref Range   Glucose-Capillary 144 (H) 70 - 99 mg/dL   Comment 1 Notify RN    Comment 2 Document in Chart   Glucose, capillary     Status: Abnormal   Collection Time: 01/31/20  8:34 PM  Result Value Ref Range   Glucose-Capillary 122 (H) 70 - 99 mg/dL  CBC     Status: Abnormal   Collection Time: 02/01/20  5:33 AM  Result Value Ref Range   WBC 8.0 4.0 - 10.5 K/uL   RBC 3.13 (L) 3.87 - 5.11 MIL/uL   Hemoglobin 8.9 (L) 12.0 - 15.0 g/dL   HCT 04/02/20 (L) 66.4 - 40.3 %   MCV 90.1 80.0 - 100.0 fL   MCH 28.4 26.0 - 34.0 pg   MCHC 31.6 30.0 - 36.0 g/dL   RDW 47.4 25.9 - 56.3 %   Platelets 188 150 - 400 K/uL   nRBC 0.0 0.0 - 0.2 %  Glucose, capillary     Status: Abnormal   Collection Time: 02/01/20  8:00 AM  Result Value Ref Range   Glucose-Capillary 163 (H) 70 - 99 mg/dL  Glucose, capillary     Status: Abnormal   Collection Time: 02/01/20 12:22 PM  Result Value Ref Range   Glucose-Capillary 255 (H) 70 - 99 mg/dL    No results found.  Assessment/Plan: 3 Days Post-Op   Principal Problem:   Closed right hip fracture (HCC) Active Problems:   Type II diabetes mellitus with renal manifestations (HCC)   Benign essential HTN   UTI (urinary  tract infection)   Dementia (HCC)   Hypokalemia   Fall at home, initial encounter   Up with therapy   Continue Lovenox for DVT prophylaxis.  Patient should remain on Lovenox 40 mg daily for 2 weeks after discharge until follow-up in the orthopedic office.  Patient is doing well from orthopedic standpoint may be discharged to skilled nursing facility whenever cleared by medicine and bed available.   04/02/20 , MD 02/01/2020, 12:45 PM

## 2020-02-01 NOTE — Progress Notes (Signed)
PROGRESS NOTE    Melanie Turner  GBT:[id number]  DOB: [birth year]  DOA: 01/28/2020 PCP: Kirk Ruths, MD Outpatient Specialists:   Hospital course:  Melanie Turner is a 86 y.o. female with medical history significant of dementia, hypertension, diabetes mellitus, depression, one-time seizure, who was admitted 01/27/1930 with right hip fracture status post mechanical fall although work-up does reveal UTI.  X-ray reveals subcapital femoral neck fracture with impaction and varus angulation.  Subjective:  Patient was dozing off in her recliner when I came in.  She woke up to voice alone.  She seemed to be in good spirits.  She had no complaints.   Objective: Vitals:   02/01/20 0011 02/01/20 0625 02/01/20 0803 02/01/20 1557  BP: 134/78 (!) 144/78 (!) 162/86 (!) 173/89  Pulse: 87 84 86 96  Resp: 17 18 14 16   Temp: 99.2 F (37.3 C) 99.3 F (37.4 C) 98 F (36.7 C) 100.1 F (37.8 C)  TempSrc: Oral Oral  Oral  SpO2: 99% 99% 100% 100%  Weight:      Height:        Intake/Output Summary (Last 24 hours) at 02/01/2020 1818 Last data filed at 02/01/2020 1814 Gross per 24 hour  Intake --  Output 550 ml  Net -550 ml   Filed Weights   01/28/20 1134  Weight: 54.4 kg     Assessment & Plan:   86 year old female admitted with right hip fracture status post mechanical fall.  Incidentally found to have low-grade UTI.  Closed right hip fracture Patient being followed by orthopedics for surgery in the morning. Pain management and DVT prophylaxis per orthopedics. PT OT per orthopedics.  HTN Blood pressure has been rising despite use of amlodipine 2.5. If continues to be persistently elevated, can increase amlodipine to 5 mg tomorrow.  Type II diabetes mellitus with renal manifestations  Under reasonable but not optimal control however given lability, would not want to try to tighten coverage at this point as low blood sugar would be more detrimental than intermittent high  ones. Most recent A1c 11.4, poorly controled.  SSI AC at bedtime Hold Metformin   UTI (urinary tract infection) She has completed a 3-day course of Rocephin  Hypokalemia Normalized with repletion Dementia  Continue donepezil   DVT prophylaxis: Lovenox Code Status: DNR Family Communication: Spoke with patient's son who was at bedside today.  He had no further questions. Disposition Plan:   Patient is from: Home  Anticipated Discharge Location: Rehab  Barriers to Discharge: Status post surgery this morning  Is patient medically stable for Discharge: Yes when orthopedics feels she is ready to go.   Consultants:  Orthopedics  Procedures:  Right hemiarthroplasty of hip  Antimicrobials:  Rocephin   Exam:  General: Pleasantly demented thin female in good spirits sitting up in chair eating breakfast with son at bedside. Eyes: sclera anicteric, conjuctiva mild injection bilaterally CVS: S1-S2, regular  Respiratory:  decreased air entry bilaterally secondary to decreased inspiratory effort, rales at bases  GI: NABS, soft, NT  LE: No edema.    Data Reviewed: Basic Metabolic Panel: Recent Labs  Lab 01/28/20 1157 01/29/20 0445 01/30/20 0520 01/31/20 0534  NA 137 140 137 141  K 3.3* 3.2* 3.8 3.5  CL 98 104 103 107  CO2 26 26 24 27   GLUCOSE 299* 184* 205* 177*  BUN 20 16 22 23   CREATININE 0.94 0.64 0.73 0.70  CALCIUM 9.7 9.1 8.7* 8.5*  MG  --  1.9  --   --  Liver Function Tests: Recent Labs  Lab 01/28/20 1157  AST 37  ALT 23  ALKPHOS 46  BILITOT 1.6*  PROT 7.9  ALBUMIN 4.1   No results for input(s): LIPASE, AMYLASE in the last 168 hours. No results for input(s): AMMONIA in the last 168 hours. CBC: Recent Labs  Lab 01/28/20 1157 01/29/20 0445 01/30/20 0520 01/31/20 0534 02/01/20 0533  WBC 8.4 8.8 8.0 8.7 8.0  HGB 12.1 12.1 10.3* 9.5* 8.9*  HCT 36.9 36.5 32.1* 29.8* 28.2*  MCV 87.0 85.7 88.4 89.5 90.1  PLT 178 151 156 165 188   Cardiac  Enzymes: No results for input(s): CKTOTAL, CKMB, CKMBINDEX, TROPONINI in the last 168 hours. BNP (last 3 results) No results for input(s): PROBNP in the last 8760 hours. CBG: Recent Labs  Lab 01/31/20 1649 01/31/20 2034 02/01/20 0800 02/01/20 1222 02/01/20 1643  GLUCAP 144* 122* 163* 255* 122*    Recent Results (from the past 240 hour(s))  Urine Culture     Status: None   Collection Time: 01/28/20  1:13 PM   Specimen: Urine, Random  Result Value Ref Range Status   Specimen Description   Final    URINE, RANDOM Performed at [facility], [address], [place], [place] [address]    Special Requests   Final    NONE Performed at [facility], [address], [place], [place] [address]    Culture   Final    NO GROWTH Performed at [facility], [address], [place], [place] [address]    Report Status 01/29/2020 FINAL  Final  Respiratory Panel by RT PCR (Flu A&B, Covid) - Nasopharyngeal Swab     Status: None   Collection Time: 01/28/20  1:42 PM   Specimen: Nasopharyngeal Swab  Result Value Ref Range Status   SARS Coronavirus 2 by RT PCR NEGATIVE NEGATIVE Final    Comment: (NOTE) SARS-CoV-2 target nucleic acids are NOT DETECTED. The SARS-CoV-2 RNA is generally detectable in upper respiratoy specimens during the acute phase of infection. The lowest concentration of SARS-CoV-2 viral copies this assay can detect is 131 copies/mL. A negative result does not preclude SARS-Cov-2 infection and should not be used as the sole basis for treatment or other patient management decisions. A negative result may occur with  improper specimen collection/handling, submission of specimen other than nasopharyngeal swab, presence of viral mutation(s) within the areas targeted by this assay, and inadequate number of viral copies (<131 copies/mL). A negative result must be combined with clinical observations, patient history, and epidemiological information.  The expected result is Negative. Fact Sheet for Patients:  https://www.moore.com/ Fact Sheet for Healthcare Providers:  https://www.young.biz/ This test is not yet ap proved or cleared by the [place] FDA and  has been authorized for detection and/or diagnosis of SARS-CoV-2 by FDA under an Emergency Use Authorization (EUA). This EUA will remain  in effect (meaning this test can be used) for the duration of the COVID-19 declaration under Section 564(b)(1) of the Act, 21 U.S.C. section 360bbb-3(b)(1), unless the authorization is terminated or revoked sooner.    Influenza A by PCR NEGATIVE NEGATIVE Final   Influenza B by PCR NEGATIVE NEGATIVE Final    Comment: (NOTE) The Xpert Xpress SARS-CoV-2/FLU/RSV assay is intended as an aid in  the diagnosis of influenza from Nasopharyngeal swab specimens and  should not be used as a sole basis for treatment. Nasal washings and  aspirates are unacceptable for Xpert Xpress SARS-CoV-2/FLU/RSV  testing. Fact Sheet for  Patients: https://www.moore.com/ Fact Sheet for Healthcare Providers: https://www.young.biz/ This test is not yet approved or cleared by the [place] FDA and  has been authorized for detection and/or diagnosis of SARS-CoV-2 by  FDA under an Emergency Use Authorization (EUA). This EUA will remain  in effect (meaning this test can be used) for the duration of the  Covid-19 declaration under Section 564(b)(1) of the Act, 21  U.S.C. section 360bbb-3(b)(1), unless the authorization is  terminated or revoked. Performed at [facility], [address] [place], [place] [address]   Surgical pcr screen     Status: None   Collection Time: 01/29/20  3:35 AM   Specimen: Nasal Mucosa; Nasal Swab  Result Value Ref Range Status   MRSA, PCR NEGATIVE NEGATIVE Final   Staphylococcus aureus NEGATIVE NEGATIVE Final    Comment: (NOTE) The Xpert SA Assay  (FDA approved for NASAL specimens in patients 19 years of age and older), is one component of a comprehensive surveillance program. It is not intended to diagnose infection nor to guide or monitor treatment. Performed at [facility], [address], [place], [place] [address]       Studies: No results found.   Scheduled Meds: . amLODipine  2.5 mg Oral Daily  . Chlorhexidine Gluconate Cloth  6 each Topical Daily  . docusate sodium  100 mg Oral BID  . donepezil  5 mg Oral Daily  . enoxaparin (LOVENOX) injection  40 mg Subcutaneous Q24H  . feeding supplement (ENSURE ENLIVE)  237 mL Oral BID BM  . insulin aspart  0-15 Units Subcutaneous TID WC  . insulin aspart  0-5 Units Subcutaneous QHS  . mirtazapine  7.5 mg Oral Daily  . multivitamin with minerals  1 tablet Oral Daily  . senna  1 tablet Oral BID  . traMADol  50 mg Oral Q6H   Continuous Infusions: . sodium chloride 50 mL/hr at 01/31/20 2343    Principal Problem:   Closed right hip fracture [facility]) Active Problems:   Type II diabetes mellitus with renal manifestations (HCC)   Benign essential HTN   UTI (urinary tract infection)   Dementia (HCC)   Hypokalemia   Fall at home, initial encounter     Pieter Partridge, [facility]  If 7PM-7AM, please contact night-coverage [email or web address] Password [email or web address] 02/01/2020, 6:18 PM    LOS: 4 days

## 2020-02-02 LAB — BASIC METABOLIC PANEL
Anion gap: 6 (ref 5–15)
BUN: 16 mg/dL (ref 8–23)
CO2: 30 mmol/L (ref 22–32)
Calcium: 8.1 mg/dL — ABNORMAL LOW (ref 8.9–10.3)
Chloride: 106 mmol/L (ref 98–111)
Creatinine, Ser: 0.51 mg/dL (ref 0.44–1.00)
GFR calc Af Amer: >60 mL/min (ref >60)
GFR calc non Af Amer: >60 mL/min (ref >60)
Glucose, Bld: 167 mg/dL — ABNORMAL HIGH (ref 70–99)
Potassium: 3.3 mmol/L — ABNORMAL LOW (ref 3.5–5.1)
Sodium: 142 mmol/L (ref 135–145)

## 2020-02-02 LAB — GLUCOSE, CAPILLARY
Glucose-Capillary: 164 mg/dL — ABNORMAL HIGH (ref 70–99)
Glucose-Capillary: 222 mg/dL — ABNORMAL HIGH (ref 70–99)
Glucose-Capillary: 215 mg/dL — ABNORMAL HIGH (ref 70–99)
Glucose-Capillary: 184 mg/dL — ABNORMAL HIGH (ref 70–99)

## 2020-02-02 LAB — CBC
HCT: 25.7 % — ABNORMAL LOW (ref 36.0–46.0)
Hemoglobin: 8.5 g/dL — ABNORMAL LOW (ref 12.0–15.0)
MCH: 29.1 pg (ref 26.0–34.0)
MCHC: 33.1 g/dL (ref 30.0–36.0)
MCV: 88 fL (ref 80.0–100.0)
Platelets: 210 10*3/uL (ref 150–400)
RBC: 2.92 MIL/uL — ABNORMAL LOW (ref 3.87–5.11)
RDW: 13.9 % (ref 11.5–15.5)
WBC: 7.3 10*3/uL (ref 4.0–10.5)
nRBC: 0 % (ref 0.0–0.2)

## 2020-02-02 MED ORDER — POTASSIUM CHLORIDE 20 MEQ PO PACK
40.0000 meq | PACK | Freq: Once | ORAL | Status: AC
  Administered 2020-02-02: 40 meq via ORAL
  Filled 2020-02-02: qty 2

## 2020-02-02 MED ORDER — AMLODIPINE BESYLATE 5 MG PO TABS
5.0000 mg | ORAL_TABLET | Freq: Every day | ORAL | Status: DC
  Administered 2020-02-03 – 2020-02-04 (×2): 5 mg via ORAL
  Filled 2020-02-02 (×2): qty 1

## 2020-02-02 NOTE — Progress Notes (Signed)
PROGRESS NOTE    Melanie Turner  HYI:[id number]  DOB: 1932-01-02  DOA: 01/28/2020 PCP: Lauro Regulus, MD Outpatient Specialists:   Hospital course:  Melanie Turner is a 86 y.o. female with PMH significant of dementia, hypertension, diabetes mellitus, depression, one-time seizure, who was admitted 01/27/1930 with right hip fracture status post mechanical fall although work-up does reveal UTI.  X-ray reveals subcapital femoral neck fracture with impaction and varus angulation.  Subjective:  Patient is sitting up in recliner in her usual delightful spirits with family at bedside.  She is is laughing and smiling and says she is doing great.  She has no complaints.  Denies shortness of breath.  Does not really know where she is or why she is here but is happy.  When I asked her you know where you are she says  "I am here, that is where I am!"  with a smile.  Objective: Vitals:   02/01/20 2004 02/01/20 2356 02/01/20 2357 02/02/20 0823  BP: (!) 164/88 121/76 121/76 (!) 183/90  Pulse: 100 84 83 98  Resp: 17 17 17 20   Temp: 98.8 F (37.1 C) 97.8 F (36.6 C) 97.8 F (36.6 C) 98.3 F (36.8 C)  TempSrc: Oral Oral Oral Oral  SpO2: 100% 99% 99% 100%  Weight:      Height:        Intake/Output Summary (Last 24 hours) at 02/02/2020 1637 Last data filed at 02/02/2020 04/03/2020 Gross per 24 hour  Intake 1053.33 ml  Output 800 ml  Net 253.33 ml   Filed Weights   01/28/20 1134  Weight: 54.4 kg     Assessment & Plan:   86 year old female admitted with right hip fracture status post mechanical fall.  Incidentally found to have low-grade UTI.  Closed right hip fracture POD #3 status post right hip hemiarthroplasty. Pain management and DVT prophylaxis per orthopedics. Patient is on Lovenox. PT OT per orthopedics.  HTN Blood pressure has been rising despite use of amlodipine 2.5. Will increase amlodipine to 5 mg daily  Hypokalemia We will replete and recheck  Type II diabetes  mellitus with renal manifestations  Under reasonable control in house Most recent A1c 11.4, poorly controled at home. SSI AC at bedtime Hold Metformin   UTI (urinary tract infection) She has completed a 3-day course of Rocephin  Hypokalemia Normalized with repletion  Dementia  Continue donepezil   DVT prophylaxis: Lovenox Code Status: DNR Family Communication: Spoke with patient's son who was at bedside today.  He had no further questions. Disposition Plan:   Patient is from: Home  Anticipated Discharge Location: Rehab  Barriers to Discharge: Status post surgery this morning  Is patient medically stable for Discharge: Yes when orthopedics feels she is ready to go.   Consultants:  Orthopedics  Procedures:  Right hemiarthroplasty of hip  Antimicrobials:  Rocephin   Exam:  General: Pleasantly demented thin female in good spirits sitting up in chair eating breakfast with son at bedside. Eyes: sclera anicteric, conjuctiva mild injection bilaterally CVS: S1-S2, regular  Respiratory:  decreased air entry bilaterally secondary to decreased inspiratory effort, rales at bases  GI: NABS, soft, NT  LE: No edema.    Data Reviewed: Basic Metabolic Panel: Recent Labs  Lab 01/28/20 1157 01/29/20 0445 01/30/20 0520 01/31/20 0534 02/02/20 0526  NA 137 140 137 141 142  K 3.3* 3.2* 3.8 3.5 3.3*  CL 98 104 103 107 106  CO2 26 26 24 27 30   GLUCOSE 299* 184* 205*  177* 167*  BUN 20 16 22 23 16   CREATININE 0.94 0.64 0.73 0.70 0.51  CALCIUM 9.7 9.1 8.7* 8.5* 8.1*  MG  --  1.9  --   --   --    Liver Function Tests: Recent Labs  Lab 01/28/20 1157  AST 37  ALT 23  ALKPHOS 46  BILITOT 1.6*  PROT 7.9  ALBUMIN 4.1   No results for input(s): LIPASE, AMYLASE in the last 168 hours. No results for input(s): AMMONIA in the last 168 hours. CBC: Recent Labs  Lab 01/29/20 0445 01/30/20 0520 01/31/20 0534 02/01/20 0533 02/02/20 0526  WBC 8.8 8.0 8.7 8.0 7.3  HGB  12.1 10.3* 9.5* 8.9* 8.5*  HCT 36.5 32.1* 29.8* 28.2* 25.7*  MCV 85.7 88.4 89.5 90.1 88.0  PLT 151 156 165 188 210   Cardiac Enzymes: No results for input(s): CKTOTAL, CKMB, CKMBINDEX, TROPONINI in the last 168 hours. BNP (last 3 results) No results for input(s): PROBNP in the last 8760 hours. CBG: Recent Labs  Lab 02/01/20 1222 02/01/20 1643 02/01/20 2116 02/02/20 0820 02/02/20 1148  GLUCAP 255* 122* 99 164* 222*    Recent Results (from the past 240 hour(s))  Urine Culture     Status: None   Collection Time: 01/28/20  1:13 PM   Specimen: Urine, Random  Result Value Ref Range Status   Specimen Description   Final    URINE, RANDOM Performed at [facility], [address] [place], [place]    Special Requests   Final    NONE Performed at [facility], [address], [place], [place]    Culture   Final    NO GROWTH Performed at [facility], [address] [place], [place]    Report Status 01/29/2020 FINAL  Final  Respiratory Panel by RT PCR (Flu A&B, Covid) - Nasopharyngeal Swab     Status: None   Collection Time: 01/28/20  1:42 PM   Specimen: Nasopharyngeal Swab  Result Value Ref Range Status   SARS Coronavirus 2 by RT PCR NEGATIVE NEGATIVE Final    Comment: (NOTE) SARS-CoV-2 target nucleic acids are NOT DETECTED. The SARS-CoV-2 RNA is generally detectable in upper respiratoy specimens during the acute phase of infection. The lowest concentration of SARS-CoV-2 viral copies this assay can detect is 131 copies/mL. A negative result does not preclude SARS-Cov-2 infection and should not be used as the sole basis for treatment or other patient management decisions. A negative result may occur with  improper specimen collection/handling, submission of specimen other than nasopharyngeal swab, presence of viral mutation(s) within the areas targeted by this assay, and inadequate number of viral copies (<131  copies/mL). A negative result must be combined with clinical observations, patient history, and epidemiological information. The expected result is Negative. Fact Sheet for Patients:  01/30/20 Fact Sheet for Healthcare Providers:  https://www.moore.com/ This test is not yet ap proved or cleared by the https://www.young.biz/ FDA and  has been authorized for detection and/or diagnosis of SARS-CoV-2 by FDA under an Emergency Use Authorization (EUA). This EUA will remain  in effect (meaning this test can be used) for the duration of the COVID-19 declaration under Section 564(b)(1) of the Act, 21 U.S.C. section 360bbb-3(b)(1), unless the authorization is terminated or revoked sooner.    Influenza A by PCR NEGATIVE NEGATIVE Final   Influenza B by PCR NEGATIVE NEGATIVE Final    Comment: (NOTE) The Xpert Xpress SARS-CoV-2/FLU/RSV assay is intended as  an aid in  the diagnosis of influenza from Nasopharyngeal swab specimens and  should not be used as a sole basis for treatment. Nasal washings and  aspirates are unacceptable for Xpert Xpress SARS-CoV-2/FLU/RSV  testing. Fact Sheet for Patients: PinkCheek.be Fact Sheet for Healthcare Providers: GravelBags.it This test is not yet approved or cleared by the [place] FDA and  has been authorized for detection and/or diagnosis of SARS-CoV-2 by  FDA under an Emergency Use Authorization (EUA). This EUA will remain  in effect (meaning this test can be used) for the duration of the  Covid-19 declaration under Section 564(b)(1) of the Act, 21  U.S.C. section 360bbb-3(b)(1), unless the authorization is  terminated or revoked. Performed at [facility], [address], [place], [place] [address]   Surgical pcr screen     Status: None   Collection Time: 01/29/20  3:35 AM   Specimen: Nasal Mucosa; Nasal Swab  Result Value Ref Range Status    MRSA, PCR NEGATIVE NEGATIVE Final   Staphylococcus aureus NEGATIVE NEGATIVE Final    Comment: (NOTE) The Xpert SA Assay (FDA approved for NASAL specimens in patients 65 years of age and older), is one component of a comprehensive surveillance program. It is not intended to diagnose infection nor to guide or monitor treatment. Performed at [facility], [address], [place], [place] [address]       Studies: No results found.   Scheduled Meds: . amLODipine  2.5 mg Oral Daily  . Chlorhexidine Gluconate Cloth  6 each Topical Daily  . docusate sodium  100 mg Oral BID  . donepezil  5 mg Oral Daily  . enoxaparin (LOVENOX) injection  40 mg Subcutaneous Q24H  . feeding supplement (ENSURE ENLIVE)  237 mL Oral BID BM  . insulin aspart  0-15 Units Subcutaneous TID WC  . insulin aspart  0-5 Units Subcutaneous QHS  . mirtazapine  7.5 mg Oral Daily  . multivitamin with minerals  1 tablet Oral Daily  . senna  1 tablet Oral BID  . traMADol  50 mg Oral Q6H   Continuous Infusions: . sodium chloride 50 mL/hr at 02/02/20 7902    Principal Problem:   Closed right hip fracture (HCC) Active Problems:   Type II diabetes mellitus with renal manifestations (HCC)   Benign essential HTN   UTI (urinary tract infection)   Dementia ([place])   Hypokalemia   Fall at home, initial encounter     Vashti Hey, [facility]  If 7PM-7AM, please contact night-coverage [email or web address] Password [facility] 02/02/2020, 4:37 PM    LOS: 5 days

## 2020-02-02 NOTE — Progress Notes (Signed)
  Subjective:  Patient reports pain as mild.    Objective:   VITALS:   Vitals:   02/01/20 2004 02/01/20 2356 02/01/20 2357 02/02/20 0823  BP: (!) 164/88 121/76 121/76 (!) 183/90  Pulse: 100 84 83 98  Resp: 17 17 17 20   Temp: 98.8 F (37.1 C) 97.8 F (36.6 C) 97.8 F (36.6 C) 98.3 F (36.8 C)  TempSrc: Oral Oral Oral Oral  SpO2: 100% 99% 99% 100%  Weight:      Height:        PHYSICAL EXAM:  telemedicine visit  LABS  Results for orders placed or performed during the hospital encounter of 01/28/20 (from the past 24 hour(s))  Glucose, capillary     Status: Abnormal   Collection Time: 02/01/20  4:43 PM  Result Value Ref Range   Glucose-Capillary 122 (H) 70 - 99 mg/dL  Glucose, capillary     Status: None   Collection Time: 02/01/20  9:16 PM  Result Value Ref Range   Glucose-Capillary 99 70 - 99 mg/dL   Comment 1 Notify RN   Basic metabolic panel     Status: Abnormal   Collection Time: 02/02/20  5:26 AM  Result Value Ref Range   Sodium 142 135 - 145 mmol/L   Potassium 3.3 (L) 3.5 - 5.1 mmol/L   Chloride 106 98 - 111 mmol/L   CO2 30 22 - 32 mmol/L   Glucose, Bld 167 (H) 70 - 99 mg/dL   BUN 16 8 - 23 mg/dL   Creatinine, Ser 04/03/20 0.44 - 1.00 mg/dL   Calcium 8.1 (L) 8.9 - 10.3 mg/dL   GFR calc non Af Amer >60 >60 mL/min   GFR calc Af Amer >60 >60 mL/min   Anion gap 6 5 - 15  CBC     Status: Abnormal   Collection Time: 02/02/20  5:26 AM  Result Value Ref Range   WBC 7.3 4.0 - 10.5 K/uL   RBC 2.92 (L) 3.87 - 5.11 MIL/uL   Hemoglobin 8.5 (L) 12.0 - 15.0 g/dL   HCT 04/03/20 (L) 62.2 - 29.7 %   MCV 88.0 80.0 - 100.0 fL   MCH 29.1 26.0 - 34.0 pg   MCHC 33.1 30.0 - 36.0 g/dL   RDW 98.9 21.1 - 94.1 %   Platelets 210 150 - 400 K/uL   nRBC 0.0 0.0 - 0.2 %  Glucose, capillary     Status: Abnormal   Collection Time: 02/02/20  8:20 AM  Result Value Ref Range   Glucose-Capillary 164 (H) 70 - 99 mg/dL   Comment 1 Notify RN   Glucose, capillary     Status: Abnormal   Collection Time: 02/02/20 11:48 AM  Result Value Ref Range   Glucose-Capillary 222 (H) 70 - 99 mg/dL   Comment 1 Notify RN     No results found.  Assessment/Plan: 4 Days Post-Op   Principal Problem:   Closed right hip fracture (HCC) Active Problems:   Type II diabetes mellitus with renal manifestations (HCC)   Benign essential HTN   UTI (urinary tract infection)   Dementia (HCC)   Hypokalemia   Fall at home, initial encounter   Up with therapy Discharge to SNF   04/03/20 , MD 02/02/2020, 3:36 PM

## 2020-02-02 NOTE — Progress Notes (Signed)
Physical Therapy Treatment Patient Details Name: Melanie Turner MRN: [id number] DOB: 09/22/1932 Today's Date: 02/02/2020    History of Present Illness Pt is an 86 y.o. female presenting to hospital 01/28/20 with difficulty walking after a fall.  Pt noted with R subcapital femoral neck fx with impaction and varus angulation at fx site.  Pt s/p R hip hemiarthroplasty 01/29/20 with posterolateral approach.  PMH includes dementia, DM, htn, seizure, thyroidectomy.    PT Comments    Participated in exercises as described below.  Tactile cues for ex's.  Has difficulty with verbal cues.  To EOB with min a x 1 and increased time.  Overall improved ability to assist today.  Once sitting, she is able to sit unsupported but supervision provided for safety due to cognition.  Stood with walker and max a x 1 with post lean on bed.  Unable to lean forward and control posture after verbal and tactile cues.  Sat on bed and stand pivot max a +1 to recliner for safety.  Remained up with son in room.   Follow Up Recommendations  SNF     Equipment Recommendations  Rolling walker with 5" wheels;3in1 (PT);Wheelchair (measurements PT);Wheelchair cushion (measurements PT)    Recommendations for Other Services       Precautions / Restrictions      Mobility  Bed Mobility Overal bed mobility: Needs Assistance Bed Mobility: Supine to Sit     Supine to sit: Min assist;Mod assist     General bed mobility comments: increased ability today to initiate and assist with OOB  Transfers Overall transfer level: Needs assistance Equipment used: Rolling walker (2 wheeled);None Transfers: Sit to/from Stand Sit to Stand: Max assist Stand pivot transfers: Max assist       General transfer comment: able to stand with walker and max a x 1 but excessive lean back on bed.  Unsafe to use walker and +1 assist so stnd pivot to bed was used.  Ambulation/Gait                 Stairs             Wheelchair  Mobility    Modified Rankin (Stroke Patients Only)       Balance Overall balance assessment: Needs assistance Sitting-balance support: Feet supported;Bilateral upper extremity supported Sitting balance-[name] Scale: Fair Sitting balance - Comments: supervision on bed due to safety but able to sit without support today.   Standing balance support: Bilateral upper extremity supported Standing balance-[name] Scale: Poor Standing balance comment: pt requiring B UE support on RW and 1 assist to maintain standing balance                            Cognition Arousal/Alertness: Awake/alert Behavior During Therapy: WFL for tasks assessed/performed Overall Cognitive Status: History of cognitive impairments - at baseline                                        Exercises Total Joint Exercises Ankle Circles/Pumps: AROM;Strengthening;Both;10 reps;Supine Short Arc Quad: AAROM;Strengthening;Right;10 reps;Supine Heel Slides: AAROM;Strengthening;Right;10 reps;Supine Hip ABduction/ADduction: AAROM;Strengthening;Right;10 reps;Supine Long Arc Quad: AAROM;Strengthening;Both;10 reps    General Comments        Pertinent Vitals/Pain Pain Assessment: Faces Faces Pain Scale: Hurts little more Pain Location: R hip-with movement Pain Descriptors / Indicators: Operative site guarding;Tender;Grimacing;Guarding    Home Living  Prior Function            PT Goals (current goals can now be found in the care plan section) Progress towards PT goals: Progressing toward goals    Frequency    BID      PT Plan Current plan remains appropriate    Co-evaluation              AM-PAC PT "6 Clicks" Mobility   Outcome Measure  Help needed turning from your back to your side while in a flat bed without using bedrails?: A Lot Help needed moving from lying on your back to sitting on the side of a flat bed without using bedrails?: A Lot Help  needed moving to and from a bed to a chair (including a wheelchair)?: A Lot Help needed standing up from a chair using your arms (e.g., wheelchair or bedside chair)?: A Lot Help needed to walk in hospital room?: A Lot Help needed climbing 3-5 steps with a railing? : Total 6 Click Score: 11    End of Session Equipment Utilized During Treatment: Gait belt Activity Tolerance: Patient tolerated treatment well Patient left: in chair;with call bell/phone within reach;with chair alarm set;with family/visitor present Nurse Communication: Mobility status Pain - Right/Left: Right Pain - part of body: Hip     Time: 1000-1023 PT Time Calculation (min) (ACUTE ONLY): 23 min  Charges:  $Therapeutic Exercise: 8-22 mins $Therapeutic Activity: 8-22 mins                    Danielle Dess, PTA 02/02/20, 10:59 AM

## 2020-02-03 LAB — GLUCOSE, CAPILLARY
Glucose-Capillary: 166 mg/dL — ABNORMAL HIGH (ref 70–99)
Glucose-Capillary: 262 mg/dL — ABNORMAL HIGH (ref 70–99)
Glucose-Capillary: 171 mg/dL — ABNORMAL HIGH (ref 70–99)
Glucose-Capillary: 189 mg/dL — ABNORMAL HIGH (ref 70–99)

## 2020-02-03 LAB — MAGNESIUM: Magnesium: 1.9 mg/dL (ref 1.7–2.4)

## 2020-02-03 LAB — BASIC METABOLIC PANEL
Anion gap: 8 (ref 5–15)
BUN: 18 mg/dL (ref 8–23)
CO2: 31 mmol/L (ref 22–32)
Calcium: 8.6 mg/dL — ABNORMAL LOW (ref 8.9–10.3)
Chloride: 103 mmol/L (ref 98–111)
Creatinine, Ser: 0.56 mg/dL (ref 0.44–1.00)
GFR calc Af Amer: >60 mL/min (ref >60)
GFR calc non Af Amer: >60 mL/min (ref >60)
Glucose, Bld: 277 mg/dL — ABNORMAL HIGH (ref 70–99)
Potassium: 3.3 mmol/L — ABNORMAL LOW (ref 3.5–5.1)
Sodium: 142 mmol/L (ref 135–145)

## 2020-02-03 MED ORDER — POTASSIUM CHLORIDE 20 MEQ PO PACK
40.0000 meq | PACK | Freq: Two times a day (BID) | ORAL | Status: AC
  Administered 2020-02-03 (×2): 40 meq via ORAL
  Filled 2020-02-03 (×3): qty 2

## 2020-02-03 NOTE — Care Management Important Message (Signed)
Important Message  Patient Details  Name: Melanie Turner MRN: [id number] Date of Birth: 21-Apr-1932   Medicare Important Message Given:  Yes     [name] A [name] 02/03/2020, 10:57 AM

## 2020-02-03 NOTE — Consult Note (Signed)
PHARMACY CONSULT NOTE - FOLLOW UP  Pharmacy Consult for Electrolyte Monitoring and Replacement   Recent Labs: Potassium (mmol/L)  Date Value  02/03/2020 3.3 (L)  06/30/2014 4.1   Magnesium (mg/dL)  Date Value  02/54/2706 1.9   Calcium (mg/dL)  Date Value  23/76/2831 8.6 (L)   Calcium, Total (mg/dL)  Date Value  51/76/1607 9.1   Albumin (g/dL)  Date Value  37/07/6268 4.1   Phosphorus (mg/dL)  Date Value  48/54/6270 3.1   Sodium (mmol/L)  Date Value  02/03/2020 142  06/30/2014 139     Assessment: 87 y.o.femaleadmitted with right hip fracture status post mechanical fall and UTI.  Pharmacy has been consulted to manage recurrent hypokalemia and assess for contributing factors including potassium-wasting medications.   Magnesium wnl's.  No reported incidence of diarrhea or vomiting.  Some evidence indicates that donepezil can decrease potassium reabsorption and stimulate potassium secretion, but likely not a major contributer.  Increased insulin use would likely be the primary contributor of hypokalemia.  Patient received KCl po yesterday with no appreciable increase in blood potassium level.     Goal of Therapy:  Potassium wnl's (~4.0)  Plan:  Will increase dosing of potassium today to bid x 1 days and recheck bmp with am labs.  Albina Billet ,PharmD Clinical Pharmacist 02/03/2020 10:26 AM

## 2020-02-03 NOTE — Progress Notes (Signed)
Physical Therapy Treatment Patient Details Name: Melanie Turner MRN: [id number] DOB: 07-Aug-1932 Today's Date: 02/03/2020    History of Present Illness Pt is an 86 y.o. female presenting to hospital 01/28/20 with difficulty walking after a fall.  Pt noted with R subcapital femoral neck fx with impaction and varus angulation at fx site.  Pt s/p R hip hemiarthroplasty 01/29/20 with posterolateral approach.  PMH includes dementia, DM, htn, seizure, thyroidectomy.    PT Comments    TO EOB with increased time and min a x 1.  Steady in sitting once upright.  Stood with walker with mod a x 1 but is unable to fully gain her balance and uses bed on back of legs for support.  She is unable to step in place for transfer with walker.  Returned to sitting, and she is able to stand pivot to recliner with max a x 1.  HR does increase to 130's with transfer today.  Participated in exercises as described below.  RN in room and aware.   Follow Up Recommendations  SNF     Equipment Recommendations  Rolling walker with 5" wheels;3in1 (PT);Wheelchair (measurements PT);Wheelchair cushion (measurements PT)    Recommendations for Other Services       Precautions / Restrictions Precautions Precautions: Posterior Hip;Fall Precaution Booklet Issued: Yes (comment) Restrictions Weight Bearing Restrictions: Yes RLE Weight Bearing: Weight bearing as tolerated Other Position/Activity Restrictions: watch HR with mobility    Mobility  Bed Mobility Overal bed mobility: Needs Assistance Bed Mobility: Supine to Sit     Supine to sit: Min assist;Mod assist        Transfers Overall transfer level: Needs assistance Equipment used: Rolling walker (2 wheeled);None Transfers: Sit to/from [employer] Sit to Stand: Mod assist Stand pivot transfers: Max assist          Ambulation/Gait             General Gait Details: uanble to take effective steps with RW without knees buckling affecting  safety with transfers.   Stairs             Wheelchair Mobility    Modified Rankin (Stroke Patients Only)       Balance Overall balance assessment: Needs assistance Sitting-balance support: Feet supported;Bilateral upper extremity supported Sitting balance-[name] Scale: Fair Sitting balance - Comments: supervision on bed due to safety but able to sit without support today.   Standing balance support: Bilateral upper extremity supported Standing balance-[name] Scale: Poor Standing balance comment: pt requiring B UE support on RW and 1 assist to maintain standing balance leans right with knees buckling                            Cognition Arousal/Alertness: Awake/alert Behavior During Therapy: WFL for tasks assessed/performed Overall Cognitive Status: History of cognitive impairments - at baseline                                        Exercises Other Exercises Other Exercises: seated ankle pumps, LAQ and marches in reclined position per hip precautions.  x 10 BLE with verbal cues to stay on task    General Comments        Pertinent Vitals/Pain Pain Assessment: Faces Faces Pain Scale: Hurts little more Pain Location: R hip-with movement Pain Descriptors / Indicators: Operative site guarding;Tender;Grimacing;Guarding Pain Intervention(s): Limited activity within  patient's tolerance;Monitored during session;Repositioned    Home Living                      Prior Function            PT Goals (current goals can now be found in the care plan section) Progress towards PT goals: Progressing toward goals    Frequency    BID      PT Plan Current plan remains appropriate    Co-evaluation              AM-PAC PT "6 Clicks" Mobility   Outcome Measure  Help needed turning from your back to your side while in a flat bed without using bedrails?: A Lot Help needed moving from lying on your back to sitting on the side of a  flat bed without using bedrails?: A Lot Help needed moving to and from a bed to a chair (including a wheelchair)?: A Lot Help needed standing up from a chair using your arms (e.g., wheelchair or bedside chair)?: A Lot Help needed to walk in hospital room?: A Lot Help needed climbing 3-5 steps with a railing? : Total 6 Click Score: 11    End of Session Equipment Utilized During Treatment: Gait belt Activity Tolerance: Patient tolerated treatment well Patient left: in chair;with call bell/phone within reach;with chair alarm set Nurse Communication: Mobility status Pain - Right/Left: Right Pain - part of body: Hip     Time: 0912-0935 PT Time Calculation (min) (ACUTE ONLY): 23 min  Charges:  $Therapeutic Exercise: 8-22 mins $Therapeutic Activity: 8-22 mins                     Chesley Noon, PTA 02/03/20, 9:59 AM

## 2020-02-03 NOTE — Progress Notes (Signed)
Occupational Therapy Treatment Patient Details Name: Melanie Turner MRN: [id number] DOB: 04-01-1932 Today's Date: 02/03/2020    History of present illness Pt is an 86 y.o. female presenting to hospital 01/28/20 with difficulty walking after a fall.  Pt noted with R subcapital femoral neck fx with impaction and varus angulation at fx site.  Pt s/p R hip hemiarthroplasty 01/29/20 with posterolateral approach.  PMH includes dementia, DM, htn, seizure, thyroidectomy.   OT comments  Patient in bed with son and daughter at bedside upon entry.  Agreeable to therapy.  Pt's HR at rest ranges from 107-115.  Patient able to move to EOB and tolerate static sitting to engage in self feeding/hydration.  Therapist providing cues for body mechanics, sequencing and placement to improve independence.  Patient becoming notably anxious, demonstrating some shaking while sitting up.  HR elevating up to 135.  Deferred standing at this time secondary to anxiety and HR.  Assisted patient back in bed with all needs in reach.  Provided education regarding patient's posterior hip precautions. Son/daughter verbalized understanding.  Based on today's performance, follow up recommendation remains appropriate.     Follow Up Recommendations  SNF    Equipment Recommendations  Other (comment)(defer to next level of care)    Recommendations for Other Services      Precautions / Restrictions Precautions Precautions: Posterior Hip;Fall Precaution Booklet Issued: Yes (comment) Restrictions Weight Bearing Restrictions: Yes RLE Weight Bearing: Weight bearing as tolerated Other Position/Activity Restrictions: watch HR with mobility       Mobility Bed Mobility Overal bed mobility: Needs Assistance Bed Mobility: Supine to Sit     Supine to sit: Min assist;HOB elevated Sit to supine: Mod assist   General bed mobility comments: Demonstrated good participation when moving to EOB.  Requires increased time and VCs for body  mechanics/sequencing.  Transfers        General transfer comment: Deferred standing activity secondary to HR at 135 sitting up.  Patient notably anxious and shaking.    Balance Overall balance assessment: Needs assistance Sitting-balance support: Feet supported;Bilateral upper extremity supported Sitting balance-[name] Scale: Fair Sitting balance - Comments: Tends to lean to L side Postural control: Left lateral lean Standing balance support: Single extremity supported;Bilateral upper extremity supported Standing balance-[name] Scale: Poor Standing balance comment: pt requiring B UE support on RW and 1 assist to maintain standing balance leans right with knees buckling                           ADL either performed or assessed with clinical judgement   ADL Overall ADL's : Needs assistance/impaired Eating/Feeding: Minimal assistance;Sitting Eating/Feeding Details (indicate cue type and reason): Noted poor hand-eye coordination bringing items to mouth                                         Vision Patient Visual Report: No change from baseline Additional Comments: Endorses no dizziness or double vision.   Perception     Praxis      Cognition Arousal/Alertness: Awake/alert Behavior During Therapy: WFL for tasks assessed/performed;Anxious(Noted anxiety when sitting up at EOB) Overall Cognitive Status: History of cognitive impairments - at baseline(hx of dementia)                                 General  Comments: Patient's son and daughter present for session.        Exercises Other Exercises Other Exercises: Provided education to pt and family on importance of OOB activity to improve HR/BP and returning body to baseline. Other Exercises: Patient transitioned to EOB with MIN A and extra time.  Tolerated sitting at EOB for ~8 minutes with B UE(1-2) support.  Deferred sit<>stand this date at HR quickly escalates to 130s when sitting up.   Patient notably anxious and shaking.  Assisted patient back to supine with needs in reach.   Shoulder Instructions       General Comments      Pertinent Vitals/ Pain       Pain Assessment: Faces(Patient reports no pain supine, but endorses pain in R hip while seated at EOB) Faces Pain Scale: Hurts little more Pain Location: R hip-with movement Pain Descriptors / Indicators: Operative site guarding;Grimacing;Guarding Pain Intervention(s): Limited activity within patient's tolerance;Monitored during session;Repositioned  Home Living                                          Prior Functioning/Environment              Frequency  Min 2X/week        Progress Toward Goals  OT Goals(current goals can now be found in the care plan section)  Progress towards OT goals: Progressing toward goals     Plan Discharge plan remains appropriate;Frequency remains appropriate    Co-evaluation                 AM-PAC OT "6 Clicks" Daily Activity     Outcome Measure   Help from another person eating meals?: A Little Help from another person taking care of personal grooming?: A Lot Help from another person toileting, which includes using toliet, bedpan, or urinal?: A Lot Help from another person bathing (including washing, rinsing, drying)?: A Lot Help from another person to put on and taking off regular upper body clothing?: A Little Help from another person to put on and taking off regular lower body clothing?: A Lot 6 Click Score: 14    End of Session Equipment Utilized During Treatment: Gait belt;Rolling walker  OT Visit Diagnosis: Other abnormalities of gait and mobility (R26.89);History of falling (Z91.81);Pain Pain - Right/Left: Right Pain - part of body: Hip   Activity Tolerance Other (comment);Patient tolerated treatment well(Limited d/t HR and anxiety)   Patient Left in bed;with call bell/phone within reach;with bed alarm set;with family/visitor  present   Nurse Communication Other (comment)(Discussing HR and need for assistance with positioning)        Time: 3244-0102 OT Time Calculation (min): 30 min  Charges: OT General Charges $OT Visit: 1 Visit OT Treatments $Therapeutic Activity: 23-37 mins  Baldomero Lamy, MS, OTR/L 02/03/20, 3:43 PM

## 2020-02-03 NOTE — TOC Progression Note (Signed)
Transition of Care [facility]) - Progression Note    Patient Details  Name: Melanie Turner MRN: [id number] Date of Birth: [birth year]  [facility] CM/SW Contact  Barrie Dunker, RN Phone Number: 02/03/2020, 12:00 PM  Clinical Narrative:     Reached out to Inetta Fermo with [employer] and inquired about the insurance auth, She stated that [facility] requested more information and it has been sent and that it has not been approvied yet.  Expected Discharge Plan: Skilled Nursing Facility Barriers to Discharge: No Barriers Identified  Expected Discharge Plan and Services Expected Discharge Plan: Skilled Nursing Facility   Discharge Planning Services: CM Consult Post Acute Care Choice: Skilled Nursing Facility Living arrangements for the past 2 months: Single Family Home                                       Social Determinants of Health (SDOH) Interventions    Readmission Risk Interventions No flowsheet data found.

## 2020-02-03 NOTE — Progress Notes (Signed)
Physical Therapy Treatment Patient Details Name: Melanie Turner MRN: [id number] DOB: 1932-09-08 Today's Date: 02/03/2020    History of Present Illness Pt is an 86 y.o. female presenting to hospital 01/28/20 with difficulty walking after a fall.  Pt noted with R subcapital femoral neck fx with impaction and varus angulation at fx site.  Pt s/p R hip hemiarthroplasty 01/29/20 with posterolateral approach.  PMH includes dementia, DM, htn, seizure, thyroidectomy.    PT Comments    In chair, gown changed as it is soiled from lunch.  Attempted transfer back to bed with RW but it remains challenging and it was deferred and stnd pivot back to bed  Max a /dependant.  Returned to supine with mod a x 1.     Follow Up Recommendations  SNF     Equipment Recommendations  Rolling walker with 5" wheels;3in1 (PT);Wheelchair (measurements PT);Wheelchair cushion (measurements PT)    Recommendations for Other Services       Precautions / Restrictions Precautions Precautions: Posterior Hip;Fall Precaution Booklet Issued: Yes (comment) Restrictions Weight Bearing Restrictions: Yes RLE Weight Bearing: Weight bearing as tolerated Other Position/Activity Restrictions: watch HR with mobility    Mobility  Bed Mobility Overal bed mobility: Needs Assistance Bed Mobility: Supine to Sit     Supine to sit: Min assist;Mod assist        Transfers Overall transfer level: Needs assistance Equipment used: Rolling walker (2 wheeled);None Transfers: Sit to/from [employer] Sit to Stand: Mod assist Stand pivot transfers: Max assist       General transfer comment: able to stand with walker and max a x 1 but remains unsafe to transfer with walker and +1 assist so stnd pivot to bed was used.  Ambulation/Gait         Gait velocity: decraesed   General Gait Details: uanble to take effective steps with RW without knees buckling affecting safety with transfers.   Stairs              Wheelchair Mobility    Modified Rankin (Stroke Patients Only)       Balance Overall balance assessment: Needs assistance Sitting-balance support: Feet supported;Bilateral upper extremity supported Sitting balance-[name] Scale: Fair Sitting balance - Comments: supervision on bed due to safety but able to sit without support today.   Standing balance support: Bilateral upper extremity supported Standing balance-[name] Scale: Poor Standing balance comment: pt requiring B UE support on RW and 1 assist to maintain standing balance leans right with knees buckling                            Cognition Arousal/Alertness: Awake/alert Behavior During Therapy: WFL for tasks assessed/performed Overall Cognitive Status: History of cognitive impairments - at baseline                                        Exercises Other Exercises Other Exercises: seated ankle pumps, LAQ and marches in reclined position per hip precautions.  x 10 BLE with verbal cues to stay on task    General Comments        Pertinent Vitals/Pain Pain Assessment: Faces Faces Pain Scale: Hurts a little bit Pain Location: R hip-with movement Pain Descriptors / Indicators: Operative site guarding;Tender;Grimacing;Guarding Pain Intervention(s): Limited activity within patient's tolerance;Monitored during session;Repositioned    Home Living  Prior Function            PT Goals (current goals can now be found in the care plan section) Progress towards PT goals: Progressing toward goals    Frequency    BID      PT Plan Current plan remains appropriate    Co-evaluation              AM-PAC PT "6 Clicks" Mobility   Outcome Measure  Help needed turning from your back to your side while in a flat bed without using bedrails?: A Lot Help needed moving from lying on your back to sitting on the side of a flat bed without using bedrails?: A Lot Help  needed moving to and from a bed to a chair (including a wheelchair)?: A Lot Help needed standing up from a chair using your arms (e.g., wheelchair or bedside chair)?: A Lot Help needed to walk in hospital room?: Total Help needed climbing 3-5 steps with a railing? : Total 6 Click Score: 8    End of Session Equipment Utilized During Treatment: Gait belt Activity Tolerance: Patient tolerated treatment well Patient left: in bed;with call bell/phone within reach;with bed alarm set;with family/visitor present Nurse Communication: Mobility status;Other (comment) Pain - Right/Left: Right Pain - part of body: Hip     Time: 7062-3762 PT Time Calculation (min) (ACUTE ONLY): 12 min  Charges:  $Therapeutic Exercise: 8-22 mins $Therapeutic Activity: 8-22 mins                    Chesley Noon, PTA 02/03/20, 1:27 PM

## 2020-02-03 NOTE — Progress Notes (Signed)
PROGRESS NOTE    Melanie Turner  YYT:[id number]  DOB: Nov 17, 1931  DOA: 01/28/2020 PCP: Lauro Regulus, MD Outpatient Specialists:   Hospital course:  Melanie Turner is a 86 y.o. female with PMH significant of dementia, hypertension, diabetes mellitus, depression, one-time seizure, who was admitted 01/27/1930 with right hip fracture status post mechanical fall although work-up does reveal UTI.  X-ray reveals subcapital femoral neck fracture with impaction and varus angulation.  Subjective:  Patient is sitting up in chair with no acute distress.  She has no complaints but is happy, in excellent spirits.  When I asked her who was visiting her yesterday she said "I do not know those people".   Objective: Vitals:   02/02/20 2317 02/03/20 0755 02/03/20 1229 02/03/20 1539  BP:  (!) 141/74  (!) 147/77  Pulse: 90 79 (!) 103 98  Resp: 17 16  20   Temp:  98.4 F (36.9 C)  98.5 F (36.9 C)  TempSrc:  Oral  Oral  SpO2: 98% 100%  98%  Weight:      Height:        Intake/Output Summary (Last 24 hours) at 02/03/2020 1652 Last data filed at 02/03/2020 1356 Gross per 24 hour  Intake 600 ml  Output 150 ml  Net 450 ml   Filed Weights   01/28/20 1134  Weight: 54.4 kg     Assessment & Plan:   86 year old female admitted with right hip fracture status post mechanical fall.  Incidentally found to have low-grade UTI.  Closed right hip fracture POD #4 status post right hip hemiarthroplasty. Pain management and DVT prophylaxis per orthopedics. Patient is on Lovenox. PT OT per orthopedics.  HTN Blood pressure much improved on amlodipine 5 mg daily  Hypokalemia Managed per pharmacy consultation  Type II diabetes mellitus with renal manifestations  Under reasonable control in house Most recent A1c 11.4, poorly controled at home. SSI AC at bedtime Hold Metformin   UTI (urinary tract infection) She has completed a 3-day course of Rocephin  Hypokalemia Normalized with  repletion  Dementia  Continue donepezil   DVT prophylaxis: Lovenox Code Status: DNR Family Communication: Patient's family is frequently by and keeps up with her progress Disposition Plan:   Patient is from: Home  Anticipated Discharge Location: Rehab  Barriers to Discharge: Awaiting rehab bed  Is patient medically stable for Discharge: Yes    Consultants:  Orthopedics  Procedures:  Right hemiarthroplasty of hip  Antimicrobials:  Rocephin   Exam:  General: Pleasantly demented thin female in good spirits sitting up in chair eating breakfast with son at bedside. Eyes: sclera anicteric, conjuctiva mild injection bilaterally CVS: S1-S2, regular  Respiratory:  decreased air entry bilaterally secondary to decreased inspiratory effort, rales at bases  GI: NABS, soft, NT  LE: No edema.    Data Reviewed: Basic Metabolic Panel: Recent Labs  Lab 01/29/20 0445 01/30/20 0520 01/31/20 0534 02/02/20 0526 02/03/20 0917  NA 140 137 141 142 142  K 3.2* 3.8 3.5 3.3* 3.3*  CL 104 103 107 106 103  CO2 26 24 27 30 31   GLUCOSE 184* 205* 177* 167* 277*  BUN 16 22 23 16 18   CREATININE 0.64 0.73 0.70 0.51 0.56  CALCIUM 9.1 8.7* 8.5* 8.1* 8.6*  MG 1.9  --   --   --  1.9   Liver Function Tests: Recent Labs  Lab 01/28/20 1157  AST 37  ALT 23  ALKPHOS 46  BILITOT 1.6*  PROT 7.9  ALBUMIN 4.1  No results for input(s): LIPASE, AMYLASE in the last 168 hours. No results for input(s): AMMONIA in the last 168 hours. CBC: Recent Labs  Lab 01/29/20 0445 01/30/20 0520 01/31/20 0534 02/01/20 0533 02/02/20 0526  WBC 8.8 8.0 8.7 8.0 7.3  HGB 12.1 10.3* 9.5* 8.9* 8.5*  HCT 36.5 32.1* 29.8* 28.2* 25.7*  MCV 85.7 88.4 89.5 90.1 88.0  PLT 151 156 165 188 210   Cardiac Enzymes: No results for input(s): CKTOTAL, CKMB, CKMBINDEX, TROPONINI in the last 168 hours. BNP (last 3 results) No results for input(s): PROBNP in the last 8760 hours. CBG: Recent Labs  Lab  02/02/20 1148 02/02/20 1703 02/02/20 2125 02/03/20 0756 02/03/20 1145  GLUCAP 222* 215* 184* 166* 262*    Recent Results (from the past 240 hour(s))  Urine Culture     Status: None   Collection Time: 01/28/20  1:13 PM   Specimen: Urine, Random  Result Value Ref Range Status   Specimen Description   Final    URINE, RANDOM Performed at [facility], [address], [place], [place] [address]    Special Requests   Final    NONE Performed at [facility], [address], [place], [place] [address]    Culture   Final    NO GROWTH Performed at [facility], [place] [address] [place], [place] [address]    Report Status 01/29/2020 FINAL  Final  Respiratory Panel by RT PCR (Flu A&B, Covid) - Nasopharyngeal Swab     Status: None   Collection Time: 01/28/20  1:42 PM   Specimen: Nasopharyngeal Swab  Result Value Ref Range Status   SARS Coronavirus 2 by RT PCR NEGATIVE NEGATIVE Final    Comment: (NOTE) SARS-CoV-2 target nucleic acids are NOT DETECTED. The SARS-CoV-2 RNA is generally detectable in upper respiratoy specimens during the acute phase of infection. The lowest concentration of SARS-CoV-2 viral copies this assay can detect is 131 copies/mL. A negative result does not preclude SARS-Cov-2 infection and should not be used as the sole basis for treatment or other patient management decisions. A negative result may occur with  improper specimen collection/handling, submission of specimen other than nasopharyngeal swab, presence of viral mutation(s) within the areas targeted by this assay, and inadequate number of viral copies (<131 copies/mL). A negative result must be combined with clinical observations, patient history, and epidemiological information. The expected result is Negative. Fact Sheet for Patients:  PinkCheek.be Fact Sheet for Healthcare Providers:  GravelBags.it This test is not yet  ap proved or cleared by the [place] FDA and  has been authorized for detection and/or diagnosis of SARS-CoV-2 by FDA under an Emergency Use Authorization (EUA). This EUA will remain  in effect (meaning this test can be used) for the duration of the COVID-19 declaration under Section 564(b)(1) of the Act, 21 U.S.C. section 360bbb-3(b)(1), unless the authorization is terminated or revoked sooner.    Influenza A by PCR NEGATIVE NEGATIVE Final   Influenza B by PCR NEGATIVE NEGATIVE Final    Comment: (NOTE) The Xpert Xpress SARS-CoV-2/FLU/RSV assay is intended as an aid in  the diagnosis of influenza from Nasopharyngeal swab specimens and  should not be used as a sole basis for treatment. Nasal washings and  aspirates are unacceptable for Xpert Xpress SARS-CoV-2/FLU/RSV  testing. Fact Sheet for Patients: PinkCheek.be Fact Sheet for Healthcare Providers: GravelBags.it This test is not yet approved or cleared by the [place] FDA and  has been authorized for detection and/or  diagnosis of SARS-CoV-2 by  FDA under an Emergency Use Authorization (EUA). This EUA will remain  in effect (meaning this test can be used) for the duration of the  Covid-19 declaration under Section 564(b)(1) of the Act, 21  U.S.C. section 360bbb-3(b)(1), unless the authorization is  terminated or revoked. Performed at [facility], [address], [place], [place] [address]   Surgical pcr screen     Status: None   Collection Time: 01/29/20  3:35 AM   Specimen: Nasal Mucosa; Nasal Swab  Result Value Ref Range Status   MRSA, PCR NEGATIVE NEGATIVE Final   Staphylococcus aureus NEGATIVE NEGATIVE Final    Comment: (NOTE) The Xpert SA Assay (FDA approved for NASAL specimens in patients 107 years of age and older), is one component of a comprehensive surveillance program. It is not intended to diagnose infection nor to guide or monitor  treatment. Performed at [facility], [address] [place], [place] [address]       Studies: No results found.   Scheduled Meds: . amLODipine  5 mg Oral Daily  . docusate sodium  100 mg Oral BID  . donepezil  5 mg Oral Daily  . enoxaparin (LOVENOX) injection  40 mg Subcutaneous Q24H  . feeding supplement (ENSURE ENLIVE)  237 mL Oral BID BM  . insulin aspart  0-15 Units Subcutaneous TID WC  . insulin aspart  0-5 Units Subcutaneous QHS  . mirtazapine  7.5 mg Oral Daily  . multivitamin with minerals  1 tablet Oral Daily  . potassium chloride  40 mEq Oral BID  . senna  1 tablet Oral BID  . traMADol  50 mg Oral Q6H   Continuous Infusions:   Principal Problem:   Closed right hip fracture (HCC) Active Problems:   Type II diabetes mellitus with renal manifestations (HCC)   Benign essential HTN   UTI (urinary tract infection)   Dementia (HCC)   Hypokalemia   Fall at home, initial encounter     Pieter Partridge, [facility]  If 7PM-7AM, please contact night-coverage [email or web address] Password [email or web address] 02/03/2020, 4:52 PM    LOS: 6 days

## 2020-02-03 NOTE — Progress Notes (Signed)
Subjective:  POD #5 s/p right hip hemiarthroplasty.   Patient reports right hip pain as mild.  Patient up out of bed to a chair.  Patient awaiting insurance approval for skilled nursing facility.  Objective:   VITALS:   Vitals:   02/02/20 0823 02/02/20 2317 02/02/20 2317 02/03/20 0755  BP: (!) 183/90 (!) 142/80  (!) 141/74  Pulse: 98 92 90 79  Resp: 20  17 16   Temp: 98.3 F (36.8 C) 98.2 F (36.8 C)  98.4 F (36.9 C)  TempSrc: Oral   Oral  SpO2: 100% 99% 98% 100%  Weight:      Height:        PHYSICAL EXAM: Right lower extremity Neurovascular intact Sensation intact distally Intact pulses distally Dorsiflexion/Plantar flexion intact Incision: scant drainage No cellulitis present Compartment soft  LABS  Results for orders placed or performed during the hospital encounter of 01/28/20 (from the past 24 hour(s))  Glucose, capillary     Status: Abnormal   Collection Time: 02/02/20  5:03 PM  Result Value Ref Range   Glucose-Capillary 215 (H) 70 - 99 mg/dL  Glucose, capillary     Status: Abnormal   Collection Time: 02/02/20  9:25 PM  Result Value Ref Range   Glucose-Capillary 184 (H) 70 - 99 mg/dL   Comment 1 Notify RN   Glucose, capillary     Status: Abnormal   Collection Time: 02/03/20  7:56 AM  Result Value Ref Range   Glucose-Capillary 166 (H) 70 - 99 mg/dL   Comment 1 Notify RN    Comment 2 Document in Chart   Basic metabolic panel     Status: Abnormal   Collection Time: 02/03/20  9:17 AM  Result Value Ref Range   Sodium 142 135 - 145 mmol/L   Potassium 3.3 (L) 3.5 - 5.1 mmol/L   Chloride 103 98 - 111 mmol/L   CO2 31 22 - 32 mmol/L   Glucose, Bld 277 (H) 70 - 99 mg/dL   BUN 18 8 - 23 mg/dL   Creatinine, Ser 0.56 0.44 - 1.00 mg/dL   Calcium 8.6 (L) 8.9 - 10.3 mg/dL   GFR calc non Af Amer >60 >60 mL/min   GFR calc Af Amer >60 >60 mL/min   Anion gap 8 5 - 15  Magnesium     Status: None   Collection Time: 02/03/20  9:17 AM  Result Value Ref Range   Magnesium 1.9 1.7 - 2.4 mg/dL  Glucose, capillary     Status: Abnormal   Collection Time: 02/03/20 11:45 AM  Result Value Ref Range   Glucose-Capillary 262 (H) 70 - 99 mg/dL   Comment 1 Notify RN    Comment 2 Document in Chart     No results found.  Assessment/Plan: 5 Days Post-Op   Principal Problem:   Closed right hip fracture (HCC) Active Problems:   Type II diabetes mellitus with renal manifestations (HCC)   Benign essential HTN   UTI (urinary tract infection)   Dementia ([place])   Hypokalemia   Fall at home, initial encounter  The patient is doing well from orthopedic standpoint.  Continue physical therapy.  Patient is ready for discharge from an orthopedic standpoint once insurance approval is obtained from her insurance company.  Continue Lovenox 40 mg daily for DVT prophylaxis x2 weeks postop.  Patient will follow up in my office in 10 to 14 days after discharge for wound check, staple removal and x-ray.    Thornton Park , MD  02/03/2020, 12:48 PM

## 2020-02-03 NOTE — Progress Notes (Signed)
Inpatient Diabetes Program Recommendations  AACE/ADA: New Consensus Statement on Inpatient Glycemic Control (2015)  Target Ranges:  Prepandial:   less than 140 mg/dL      [facility] postprandial:   less than 180 mg/dL (1-2 hours)      Critically ill patients:  140 - 180 mg/dL   Lab Results  Component Value Date   GLUCAP 262 (H) 02/03/2020   HGBA1C 11.4 (H) 03/08/2019    Review of Glycemic Control Results for Melanie Turner, Melanie Turner (MRN [id number]) as of 02/03/2020 12:25  Ref. Range 02/02/2020 08:20 02/02/2020 11:48 02/02/2020 17:03 02/02/2020 21:25 02/03/2020 07:56 02/03/2020 11:45  Glucose-Capillary Latest Ref Range: 70 - 99 mg/dL 411 (H) 464 (H) 314 (H) 184 (H) 166 (H) 262 (H)   Diabetes history: DM 2 Outpatient Diabetes medications: Metformin 500 Daily Current orders for Inpatient glycemic control:  Novolog 0-15 units tid + hs  Ensure Enlive bid between meals  Inpatient Diabetes Program Recommendations:    Glucose spikes in the low 200's after Ensure supplement. Will watch glucose trends.  Thanks,  Christena Deem RN, MSN, BC-ADM Inpatient Diabetes Coordinator Team Pager [phone] (8a-5p)

## 2020-02-04 DIAGNOSIS — F0150 Vascular dementia without behavioral disturbance: Secondary | ICD-10-CM | POA: Diagnosis not present

## 2020-02-04 DIAGNOSIS — Z4789 Encounter for other orthopedic aftercare: Secondary | ICD-10-CM | POA: Diagnosis not present

## 2020-02-04 DIAGNOSIS — M6281 Muscle weakness (generalized): Secondary | ICD-10-CM | POA: Diagnosis not present

## 2020-02-04 DIAGNOSIS — S199XXA Unspecified injury of neck, initial encounter: Secondary | ICD-10-CM | POA: Diagnosis not present

## 2020-02-04 DIAGNOSIS — W19XXXA Unspecified fall, initial encounter: Secondary | ICD-10-CM | POA: Diagnosis not present

## 2020-02-04 DIAGNOSIS — S0990XA Unspecified injury of head, initial encounter: Secondary | ICD-10-CM | POA: Diagnosis not present

## 2020-02-04 DIAGNOSIS — I10 Essential (primary) hypertension: Secondary | ICD-10-CM | POA: Diagnosis not present

## 2020-02-04 DIAGNOSIS — E119 Type 2 diabetes mellitus without complications: Secondary | ICD-10-CM | POA: Diagnosis not present

## 2020-02-04 DIAGNOSIS — S79922A Unspecified injury of left thigh, initial encounter: Secondary | ICD-10-CM | POA: Diagnosis not present

## 2020-02-04 DIAGNOSIS — S3992XA Unspecified injury of lower back, initial encounter: Secondary | ICD-10-CM | POA: Diagnosis not present

## 2020-02-04 DIAGNOSIS — Z79899 Other long term (current) drug therapy: Secondary | ICD-10-CM | POA: Diagnosis not present

## 2020-02-04 DIAGNOSIS — M2550 Pain in unspecified joint: Secondary | ICD-10-CM | POA: Diagnosis not present

## 2020-02-04 DIAGNOSIS — R102 Pelvic and perineal pain: Secondary | ICD-10-CM | POA: Diagnosis not present

## 2020-02-04 DIAGNOSIS — R2681 Unsteadiness on feet: Secondary | ICD-10-CM | POA: Diagnosis not present

## 2020-02-04 DIAGNOSIS — F5089 Other specified eating disorder: Secondary | ICD-10-CM | POA: Diagnosis not present

## 2020-02-04 DIAGNOSIS — W19XXXD Unspecified fall, subsequent encounter: Secondary | ICD-10-CM | POA: Diagnosis not present

## 2020-02-04 DIAGNOSIS — E1165 Type 2 diabetes mellitus with hyperglycemia: Secondary | ICD-10-CM | POA: Diagnosis not present

## 2020-02-04 DIAGNOSIS — R404 Transient alteration of awareness: Secondary | ICD-10-CM | POA: Diagnosis not present

## 2020-02-04 DIAGNOSIS — Z7401 Bed confinement status: Secondary | ICD-10-CM | POA: Diagnosis not present

## 2020-02-04 DIAGNOSIS — F0390 Unspecified dementia without behavioral disturbance: Secondary | ICD-10-CM | POA: Diagnosis not present

## 2020-02-04 DIAGNOSIS — S79921A Unspecified injury of right thigh, initial encounter: Secondary | ICD-10-CM | POA: Diagnosis not present

## 2020-02-04 DIAGNOSIS — S7291XD Unspecified fracture of right femur, subsequent encounter for closed fracture with routine healing: Secondary | ICD-10-CM | POA: Diagnosis not present

## 2020-02-04 DIAGNOSIS — S72001D Fracture of unspecified part of neck of right femur, subsequent encounter for closed fracture with routine healing: Secondary | ICD-10-CM | POA: Diagnosis not present

## 2020-02-04 DIAGNOSIS — S72001A Fracture of unspecified part of neck of right femur, initial encounter for closed fracture: Secondary | ICD-10-CM | POA: Diagnosis not present

## 2020-02-04 LAB — BASIC METABOLIC PANEL
Anion gap: 7 (ref 5–15)
BUN: 13 mg/dL (ref 8–23)
CO2: 29 mmol/L (ref 22–32)
Calcium: 8.3 mg/dL — ABNORMAL LOW (ref 8.9–10.3)
Chloride: 102 mmol/L (ref 98–111)
Creatinine, Ser: 0.49 mg/dL (ref 0.44–1.00)
GFR calc Af Amer: >60 mL/min (ref >60)
GFR calc non Af Amer: >60 mL/min (ref >60)
Glucose, Bld: 186 mg/dL — ABNORMAL HIGH (ref 70–99)
Potassium: 4 mmol/L (ref 3.5–5.1)
Sodium: 138 mmol/L (ref 135–145)

## 2020-02-04 LAB — GLUCOSE, CAPILLARY
Glucose-Capillary: 192 mg/dL — ABNORMAL HIGH (ref 70–99)
Glucose-Capillary: 245 mg/dL — ABNORMAL HIGH (ref 70–99)
Glucose-Capillary: 100 mg/dL — ABNORMAL HIGH (ref 70–99)

## 2020-02-04 LAB — SARS CORONAVIRUS 2 (TAT 6-24 HRS): SARS Coronavirus 2: NEGATIVE

## 2020-02-04 MED ORDER — MAGNESIUM CITRATE PO SOLN
1.0000 | Freq: Once | ORAL | Status: DC
  Filled 2020-02-04: qty 296

## 2020-02-04 MED ORDER — AMLODIPINE BESYLATE 5 MG PO TABS: 5.0000 mg | ORAL_TABLET | Freq: Every day | ORAL | 0 refills | Status: DC

## 2020-02-04 MED ORDER — FLEET ENEMA 7-19 GM/118ML RE ENEM
1.0000 | ENEMA | Freq: Every day | RECTAL | Status: DC | PRN
  Administered 2020-02-04: 1 via RECTAL

## 2020-02-04 NOTE — Progress Notes (Signed)
Patient is discharging to [facility].  Report called spoke with [name]. Patient transporting via EMS. Patient's belongings sent with patient. Patient's VS are stable and denies pain. Packet given to EMS transporter.

## 2020-02-04 NOTE — TOC Transition Note (Addendum)
Transition of Care [facility]) - CM/SW Discharge Note   Patient Details  Name: Melanie Turner MRN: [id number] Date of Birth: 09-02-1932  Transition of Care [facility]) CM/SW Contact:  Barrie Dunker, RN Phone Number: 02/04/2020, 10:49 AM   Clinical Narrative:    Patient to DC to [facility] room 804 Via EMS, attempted to reach the son Aurora on the phone without success. Called the daughter [employer] and notified her of the DC,  RNCM to call EMS once Nurse ready, DC packet on the chart     Barriers to Discharge: No Barriers Identified   Patient Goals and CMS Choice        Discharge Placement                       Discharge Plan and Services   Discharge Planning Services: CM Consult Post Acute Care Choice: Skilled Nursing Facility                               Social Determinants of Health (SDOH) Interventions     Readmission Risk Interventions No flowsheet data found.

## 2020-02-04 NOTE — Consult Note (Signed)
PHARMACY CONSULT NOTE - FOLLOW UP  Pharmacy Consult for Electrolyte Monitoring and Replacement   Recent Labs: Potassium (mmol/L)  Date Value  02/04/2020 4.0  06/30/2014 4.1   Magnesium (mg/dL)  Date Value  73/71/0626 1.9   Calcium (mg/dL)  Date Value  94/85/4627 8.3 (L)   Calcium, Total (mg/dL)  Date Value  03/50/0938 9.1   Albumin (g/dL)  Date Value  18/29/9371 4.1   Phosphorus (mg/dL)  Date Value  69/67/8938 3.1   Sodium (mmol/L)  Date Value  02/04/2020 138  06/30/2014 139     Assessment: 87 y.o.femaleadmitted with right hip fracture status post mechanical fall and UTI.  Pharmacy has been consulted to manage recurrent hypokalemia and assess for contributing factors including potassium-wasting medications.   Magnesium wnl's.  No reported incidence of diarrhea or vomiting.  Some evidence indicates that donepezil can decrease potassium reabsorption and stimulate potassium secretion, but likely not a major contributer.  Increased insulin use would likely be the primary contributor of hypokalemia.    Goal of Therapy:  Potassium wnl's (~4.0)  Plan:  No K replenishment warranted today - will recheck bmp with am labs.  Albina Billet ,PharmD Clinical Pharmacist 02/04/2020 7:07 AM

## 2020-02-04 NOTE — Progress Notes (Signed)
  Subjective:  POD #6 s/p right hip hemi arthroplasty.   Patient reports right hip pain as mild.  Patient seen lying in bed.  Objective:   VITALS:   Vitals:   02/03/20 1229 02/03/20 1539 02/03/20 1950 02/04/20 0810  BP:  (!) 147/77 (!) 155/76 136/85  Pulse: (!) 103 98 98 81  Resp:  20 19   Temp:  98.5 F (36.9 C) 99 F (37.2 C) 98.1 F (36.7 C)  TempSrc:  Oral Oral Oral  SpO2:  98% 99% 99%  Weight:      Height:        PHYSICAL EXAM: Right lower extremity Neurovascular intact Sensation intact distally Intact pulses distally Dorsiflexion/Plantar flexion intact Incision: dressing C/D/I No cellulitis present Compartment soft  LABS  Results for orders placed or performed during the hospital encounter of 01/28/20 (from the past 24 hour(s))  Glucose, capillary     Status: Abnormal   Collection Time: 02/03/20  5:04 PM  Result Value Ref Range   Glucose-Capillary 171 (H) 70 - 99 mg/dL   Comment 1 Notify RN    Comment 2 Document in Chart   Glucose, capillary     Status: Abnormal   Collection Time: 02/03/20  9:02 PM  Result Value Ref Range   Glucose-Capillary 189 (H) 70 - 99 mg/dL  SARS CORONAVIRUS 2 (TAT 6-24 HRS) Nasopharyngeal Nasopharyngeal Swab     Status: None   Collection Time: 02/03/20  9:45 PM   Specimen: Nasopharyngeal Swab  Result Value Ref Range   SARS Coronavirus 2 NEGATIVE NEGATIVE  Basic metabolic panel     Status: Abnormal   Collection Time: 02/04/20  5:23 AM  Result Value Ref Range   Sodium 138 135 - 145 mmol/L   Potassium 4.0 3.5 - 5.1 mmol/L   Chloride 102 98 - 111 mmol/L   CO2 29 22 - 32 mmol/L   Glucose, Bld 186 (H) 70 - 99 mg/dL   BUN 13 8 - 23 mg/dL   Creatinine, Ser 9.20 0.44 - 1.00 mg/dL   Calcium 8.3 (L) 8.9 - 10.3 mg/dL   GFR calc non Af Amer >60 >60 mL/min   GFR calc Af Amer >60 >60 mL/min   Anion gap 7 5 - 15  Glucose, capillary     Status: Abnormal   Collection Time: 02/04/20  7:33 AM  Result Value Ref Range   Glucose-Capillary 192  (H) 70 - 99 mg/dL  Glucose, capillary     Status: Abnormal   Collection Time: 02/04/20 12:02 PM  Result Value Ref Range   Glucose-Capillary 245 (H) 70 - 99 mg/dL    No results found.  Assessment/Plan: 6 Days Post-Op   Principal Problem:   Closed right hip fracture (HCC) Active Problems:   Type II diabetes mellitus with renal manifestations (HCC)   Benign essential HTN   UTI (urinary tract infection)   Dementia (HCC)   Hypokalemia   Fall at home, initial encounter  Stable from an orthopedic standpoint.  She may be discharged to skilled nursing facility when cleared by medicine.  Continue Lovenox 40 mg daily for DVT prophylaxis x2 weeks.  Follow-up with [facility] within 10 to 14 days after discharge.   Juanell Fairly , MD 02/04/2020, 1:15 PM

## 2020-02-04 NOTE — TOC Progression Note (Signed)
Transition of Care [facility]) - Progression Note    Patient Details  Name: Melanie Turner MRN: [id number] Date of Birth: 21-May-1932  [facility] CM/SW Contact  Barrie Dunker, RN Phone Number: 02/04/2020, 9:13 AM  Clinical Narrative:    Received call from Inetta Fermo at [facility] notifying me that they received insurance auth. The patient will go to room [facility]04 at [facility] to make him aware, was unable to reach, his voice mail is not set up.  Unable to leave a message  Expected Discharge Plan: Skilled Nursing Facility Barriers to Discharge: No Barriers Identified  Expected Discharge Plan and Services Expected Discharge Plan: Skilled Nursing Facility   Discharge Planning Services: CM Consult Post Acute Care Choice: Skilled Nursing Facility Living arrangements for the past 2 months: Single Family Home                                       Social Determinants of Health (SDOH) Interventions    Readmission Risk Interventions No flowsheet data found.

## 2020-02-04 NOTE — Progress Notes (Signed)
Physical Therapy Treatment Patient Details Name: Melanie Turner MRN: [id number] DOB: Mar 17, 1932 Today's Date: 02/04/2020    History of Present Illness Pt is an 86 y.o. female presenting to hospital 01/28/20 with difficulty walking after a fall.  Pt noted with R subcapital femoral neck fx with impaction and varus angulation at fx site.  Pt s/p R hip hemiarthroplasty 01/29/20 with posterolateral approach.  PMH includes dementia, DM, htn, seizure, thyroidectomy.    PT Comments    Pt was awake in supine upon therapist arriving. She is alert and pleasantly confused. She was able to follow commands consistently with increased time to process. Pt is unable to recall hip precautions. She required mod assist to progress from supine to sitting EOB. HR 105 bpm while seated EOB that elevated to 148 bpm in standing/transferring to recliner.Pt requires min assist to stand from elevated bed height but max assist to progress safely to recliner with use of gait belt and RW. Vcs throughout session for safety and technique. She was repositioned in recliner post session with chair alarm in place, call bell in reach and RN aware of her abilities. Breakfast tray placed in front of pt. PT continues to recommend DC to SNF when medically stable to address deficits with strength, balance, and safe functional mobility.     Follow Up Recommendations  SNF     Equipment Recommendations  Rolling walker with 5" wheels;3in1 (PT);Wheelchair (measurements PT);Wheelchair cushion (measurements PT)    Recommendations for Other Services       Precautions / Restrictions Precautions Precautions: Posterior Hip;Fall Precaution Booklet Issued: Yes (comment) Precaution Comments: HR elevation with transfers Restrictions Weight Bearing Restrictions: Yes RLE Weight Bearing: Weight bearing as tolerated    Mobility  Bed Mobility Overal bed mobility: Needs Assistance Bed Mobility: Supine to Sit     Supine to sit: HOB elevated;Mod  assist     General bed mobility comments: Pt was able to progress from supine (HOB elevated) to short sit L side of bed with incraesed time and vcs throughout fro technique to adhere to hip precautions. Mod assist for LE/trunk support  Transfers Overall transfer level: Needs assistance Equipment used: Rolling walker (2 wheeled);None Transfers: Sit to/from [facility] Sit to Stand: From elevated surface;Min assist Stand pivot transfers: Max assist;From elevated surface       General transfer comment: Pt was able to STS with min assist 2 x from elevated EOB however requires max assist to pivot/step to recliner. Max vcs throughout for sequencing and safety. HR elevation to 148bpm from 105bpm resting.   Ambulation/Gait             General Gait Details: unsafe to advance at this time. Tachycardia with minimal activity   Stairs             Wheelchair Mobility    Modified Rankin (Stroke Patients Only)       Balance Overall balance assessment: Needs assistance Sitting-balance support: Feet supported;Bilateral upper extremity supported Sitting balance-[name] Scale: Fair Sitting balance - Comments: Tends to lean to L side Postural control: Left lateral lean Standing balance support: Bilateral upper extremity supported Standing balance-[name] Scale: Poor Standing balance comment: pt requiring B UE support on RW and 1 assist to maintain standing balance leans right with knees buckling                            Cognition Arousal/Alertness: Awake/alert Behavior During Therapy: [facility] for tasks assessed/performed Overall Cognitive  Status: History of cognitive impairments - at baseline                                 General Comments: Pt is alert throughout session and pleasantly confused. she is able to follow commands with increased time to process. Unable to recall hip precautions (0/3)      Exercises      General Comments         Pertinent Vitals/Pain Pain Assessment: No/denies pain Faces Pain Scale: Hurts little more Pain Location: R hip-with movement Pain Descriptors / Indicators: Operative site guarding;Grimacing;Guarding Pain Intervention(s): Limited activity within patient's tolerance;Monitored during session;Repositioned    Home Living                      Prior Function            PT Goals (current goals can now be found in the care plan section) Acute Rehab PT Goals Patient Stated Goal: " I dont want to hurt" Progress towards PT goals: Progressing toward goals    Frequency    BID      PT Plan Current plan remains appropriate    Co-evaluation     PT goals addressed during session: Mobility/safety with mobility        AM-PAC PT "6 Clicks" Mobility   Outcome Measure  Help needed turning from your back to your side while in a flat bed without using bedrails?: A Lot Help needed moving from lying on your back to sitting on the side of a flat bed without using bedrails?: A Lot Help needed moving to and from a bed to a chair (including a wheelchair)?: A Lot Help needed standing up from a chair using your arms (e.g., wheelchair or bedside chair)?: A Lot Help needed to walk in hospital room?: Total Help needed climbing 3-5 steps with a railing? : Total 6 Click Score: 10    End of Session Equipment Utilized During Treatment: Gait belt Activity Tolerance: Patient tolerated treatment well Patient left: in chair;with call bell/phone within reach;with chair alarm set Nurse Communication: Mobility status PT Visit Diagnosis: Other abnormalities of gait and mobility (R26.89);Muscle weakness (generalized) (M62.81);History of falling (Z91.81);Difficulty in walking, not elsewhere classified (R26.2);Pain Pain - Right/Left: Right Pain - part of body: Hip     Time: 4403-4742 PT Time Calculation (min) (ACUTE ONLY): 18 min  Charges:  $Therapeutic Activity: 8-22 mins                      Jetta Lout PTA 02/04/20, 9:11 AM

## 2020-02-04 NOTE — Discharge Summary (Addendum)
Melanie Turner ZOX:[id number] DOB: [birth year] DOA: 01/28/2020  PCP: Lauro RegulusAnderson, Marshall W, MD  Admit date: 01/28/2020  Discharge date: 02/04/2020  Admitted From: Home   disposition: SNF   Recommendations for Outpatient Follow-up:   Follow up with PCP in 1-2 weeks  PCP: PLEASE CHECK BMP AND CBC IN 1-2 DAYS.    Home Health: N/A, patient going to SNF Equipment/Devices: Per SNF Consultations: Orthopedics Discharge Condition: Improved CODE STATUS: DNR Diet Recommendation: Regular with supplements as warranted    Chief Complaint  Patient presents with   Weakness     Brief history of present illness from the day of admission and additional interim summary     Melanie Turner a 86 y.o.femalewith PMH significant ofdementia, hypertension, diabetes mellitus, depression, one-time seizure, who was admitted 01/27/1930 with right hip fracture status post mechanical fall.  Per her son, pt accidentally fell when she was trying to reach something on the bed.  She injured her right hip, causing severe pain in the right hip.  She could not stand up after the fall. No LOC.  No unilateral numbness or tingling symptoms system no facial droop or slurred speech.  Patient does not have chest pain, shortness breath, cough, fever or chills.  No nausea, vomiting, diarrhea, abdominal pain.  Patient has dysuria and burning on urination.  In ED, patient was afebrile, hypertensive to 182/98 with 98% oxygen saturations.  She was pleasantly demented. Laboratory data was notable for potassium of 3.3, normal kidney function WBC 8.4 and hemoglobin of 12.1.  Chest x-ray showed some mild upper lobe scarring but no acute changes.  Covid PCR was negative.  Urine however was hazy with greater than 500 glucose, trace leukocytes, negative nitrite and  rare bacteria.  Previous UAs had been entirely negative.X-ray of right hip showed subcapital femoral neck fracture on the right with impaction and varus angulation at fracture site.                                                                  Hospital Course   Patient underwent right hip hemiarthroplasty on 01/31/2020.  She was followed by PT and OT and has done well.  She has been treated with Lovenox for DVT prophylaxis.  She has not required any pain management in the past 3 days.   Patient was noted be quite hypertensive, she was started on amlodipine which has been increased to 5 mg which has resulted in good control of blood pressure.   Closed right hip fracture POD #5 status post right hip hemiarthroplasty. Pain management and DVT prophylaxis per orthopedics. Patient is on Lovenox. PT OT per orthopedics.  HTN Blood pressure much improved on amlodipine 5 mg daily  Hypokalemia Managed per pharmacy consultation She has needed frequent potassium monitoring and replacement while in house.  Type II diabetes mellitus with renal manifestations  Under reasonable control in house Most recent A1c11.4, poorly controled at home. Restart Metformin upon discharge with SSI as warranted.  UTI (urinary tract infection) She has completed a 3-day course of Rocephin  Dementia  Continue donepezil   DVT prophylaxis: Lovenox Code Status: DNR Family Communication: Patient's family is frequently by and keeps up with her progress   Discharge diagnosis     Principal Problem:   Closed right hip fracture (HCC) Active Problems:   Type II diabetes mellitus with renal manifestations (HCC)   Benign essential HTN   UTI (urinary tract infection)   Dementia (HCC)   Hypokalemia   Fall at home, initial encounter    Discharge instructions    Discharge Instructions    Diet - low sodium heart healthy   Complete by: As directed    Discharge instructions   Complete by: As  directed    1. You will need your Potassium checked in 1-2 days.  2. Please make sure you do not get constipated.   Increase activity slowly   Complete by: As directed       Discharge Medications   Allergies as of 02/04/2020   No Known Allergies     Medication List    TAKE these medications   amLODipine 5 MG tablet Commonly known as: NORVASC Take 1 tablet (5 mg total) by mouth daily. Start taking on: February 05, 2020   donepezil 5 MG tablet Commonly known as: ARICEPT Take 5 mg by mouth daily. Notes to patient: Last dose given today at 9:14 AM    metFORMIN 500 MG tablet Commonly known as: Glucophage Take 1 tablet (500 mg total) by mouth 2 (two) times daily with a meal for 30 days. What changed: when to take this Notes to patient: Not given in hospital   mirtazapine 7.5 MG tablet Commonly known as: REMERON Take 7.5 mg by mouth daily. Notes to patient: Last dose given today at 9:14 AM       Contact information for after-discharge care    Destination    [facility] Preferred SNF .   Service: Skilled Nursing Contact information: [address] [place] [address] [phone]              Major procedures and Radiology Reports - PLEASE review detailed and final reports thoroughly  -      CT HEAD WO CONTRAST  Result Date: 01/28/2020 CLINICAL DATA:  Trauma, headache EXAM: CT HEAD WITHOUT CONTRAST TECHNIQUE: Contiguous axial images were obtained from the base of the skull through the vertex without intravenous contrast. COMPARISON:  03/08/2019 FINDINGS: Brain: There is no acute intracranial hemorrhage, mass effect, or edema. Gray-white differentiation is preserved. There is no extra-axial fluid collection. Patchy hypoattenuation in the supratentorial white matter is nonspecific but probably reflects stable chronic microvascular ischemic changes. Prominence of the ventricles and sulci reflects stable parenchymal volume loss. There is  disproportionate medial temporal lobe volume loss with ex vacuo dilatation of the temporal horns. Vascular: No hyperdense vessel or unexpected calcification. Skull: Calvarium is unremarkable. Sinuses/Orbits: No acute finding. Other: None. IMPRESSION: No acute intracranial hemorrhage or mass effect. Stable chronic findings detailed above. Electronically Signed   By: Guadlupe Spanish M.D.   On: 01/28/2020 17:07   DG Chest Portable 1 View  Result Date: 01/28/2020 CLINICAL DATA:  Pain following fall EXAM: PORTABLE CHEST 1 VIEW COMPARISON:  Mar 10, 2019. FINDINGS: There is slight scarring in each upper  lobe. Lungs elsewhere clear. Heart size and pulmonary vascularity are normal. There is aortic atherosclerosis. No pneumothorax. No bone lesions. IMPRESSION: Mild upper lobe scarring. No edema or consolidation. Heart size normal. Aortic Atherosclerosis (ICD10-I70.0). Electronically Signed   By: Lowella Grip III M.D.   On: 01/28/2020 13:05   DG Hip Port Unilat With Pelvis 1V Right  Result Date: 01/29/2020 CLINICAL DATA:  Right hip replacement. EXAM: DG HIP (WITH OR WITHOUT PELVIS) 1V PORT RIGHT COMPARISON:  Right hip x-ray from yesterday. FINDINGS: The right hip demonstrates a hemiarthroplasty without evidence of hardware failure or complication. There is no fracture or dislocation. The alignment is anatomic. Post-surgical changes noted in the surrounding soft tissues. IMPRESSION: Interval right hip hemiarthroplasty without acute postoperative complication. Electronically Signed   By: Titus Dubin M.D.   On: 01/29/2020 16:16   DG Hip Unilat W or Wo Pelvis 2-3 Views Right  Result Date: 01/28/2020 CLINICAL DATA:  Pain following fall EXAM: DG HIP (WITH OR WITHOUT PELVIS) 2-3V RIGHT COMPARISON:  None. FINDINGS: Frontal pelvis as well as frontal and lateral views obtained. There is a subcapital femoral neck fracture with mild impaction at the fracture site. There is Verus angulation at the fracture site. No  other fracture. No dislocation. Bones are osteoporotic. There is moderate narrowing of each hip joint. IMPRESSION: Subcapital femoral neck fracture on the right with impaction and varus angulation at fracture site. No other fracture. No dislocation. Bones osteoporotic. Moderate symmetric narrowing noted in each hip joint. Electronically Signed   By: Lowella Grip III M.D.   On: 01/28/2020 13:04    Micro Results    Recent Results (from the past 240 hour(s))  Urine Culture     Status: None   Collection Time: 01/28/20  1:13 PM   Specimen: Urine, Random  Result Value Ref Range Status   Specimen Description   Final    URINE, RANDOM Performed at [facility], [address], [place], [place] [address]    Special Requests   Final    NONE Performed at [facility], [address], [place], [place] [address]    Culture   Final    NO GROWTH Performed at [facility], [place] [address], [place], [place] [address]    Report Status 01/29/2020 FINAL  Final  Respiratory Panel by RT PCR (Flu A&B, Covid) - Nasopharyngeal Swab     Status: None   Collection Time: 01/28/20  1:42 PM   Specimen: Nasopharyngeal Swab  Result Value Ref Range Status   SARS Coronavirus 2 by RT PCR NEGATIVE NEGATIVE Final    Comment: (NOTE) SARS-CoV-2 target nucleic acids are NOT DETECTED. The SARS-CoV-2 RNA is generally detectable in upper respiratoy specimens during the acute phase of infection. The lowest concentration of SARS-CoV-2 viral copies this assay can detect is 131 copies/mL. A negative result does not preclude SARS-Cov-2 infection and should not be used as the sole basis for treatment or other patient management decisions. A negative result may occur with  improper specimen collection/handling, submission of specimen other than nasopharyngeal swab, presence of viral mutation(s) within the areas targeted by this assay, and inadequate number of viral copies (<131 copies/mL). A  negative result must be combined with clinical observations, patient history, and epidemiological information. The expected result is Negative. Fact Sheet for Patients:  PinkCheek.be Fact Sheet for Healthcare Providers:  GravelBags.it This test is not yet ap proved or cleared by the [place] and  has been authorized for  detection and/or diagnosis of SARS-CoV-2 by FDA under an Emergency Use Authorization (EUA). This EUA will remain  in effect (meaning this test can be used) for the duration of the COVID-19 declaration under Section 564(b)(1) of the Act, 21 U.S.C. section 360bbb-3(b)(1), unless the authorization is terminated or revoked sooner.    Influenza A by PCR NEGATIVE NEGATIVE Final   Influenza B by PCR NEGATIVE NEGATIVE Final    Comment: (NOTE) The Xpert Xpress SARS-CoV-2/FLU/RSV assay is intended as an aid in  the diagnosis of influenza from Nasopharyngeal swab specimens and  should not be used as a sole basis for treatment. Nasal washings and  aspirates are unacceptable for Xpert Xpress SARS-CoV-2/FLU/RSV  testing. Fact Sheet for Patients: https://www.moore.com/ Fact Sheet for Healthcare Providers: https://www.young.biz/ This test is not yet approved or cleared by the [place] FDA and  has been authorized for detection and/or diagnosis of SARS-CoV-2 by  FDA under an Emergency Use Authorization (EUA). This EUA will remain  in effect (meaning this test can be used) for the duration of the  Covid-19 declaration under Section 564(b)(1) of the Act, 21  U.S.C. section 360bbb-3(b)(1), unless the authorization is  terminated or revoked. Performed at [facility], [address], [place], [place] [address]   Surgical pcr screen     Status: None   Collection Time: 01/29/20  3:35 AM   Specimen: Nasal Mucosa; Nasal Swab  Result Value Ref Range Status   MRSA, PCR  NEGATIVE NEGATIVE Final   Staphylococcus aureus NEGATIVE NEGATIVE Final    Comment: (NOTE) The Xpert SA Assay (FDA approved for NASAL specimens in patients 41 years of age and older), is one component of a comprehensive surveillance program. It is not intended to diagnose infection nor to guide or monitor treatment. Performed at [facility], [address] [place], [place] [address]   SARS CORONAVIRUS 2 (TAT 6-24 HRS) Nasopharyngeal Nasopharyngeal Swab     Status: None   Collection Time: 02/03/20  9:45 PM   Specimen: Nasopharyngeal Swab  Result Value Ref Range Status   SARS Coronavirus 2 NEGATIVE NEGATIVE Final    Comment: (NOTE) SARS-CoV-2 target nucleic acids are NOT DETECTED. The SARS-CoV-2 RNA is generally detectable in upper and lower respiratory specimens during the acute phase of infection. Negative results do not preclude SARS-CoV-2 infection, do not rule out co-infections with other pathogens, and should not be used as the sole basis for treatment or other patient management decisions. Negative results must be combined with clinical observations, patient history, and epidemiological information. The expected result is Negative. Fact Sheet for Patients: HairSlick.no Fact Sheet for Healthcare Providers: quierodirigir.com This test is not yet approved or cleared by the [place] FDA and  has been authorized for detection and/or diagnosis of SARS-CoV-2 by FDA under an Emergency Use Authorization (EUA). This EUA will remain  in effect (meaning this test can be used) for the duration of the COVID-19 declaration under Section 56 4(b)(1) of the Act, 21 U.S.C. section 360bbb-3(b)(1), unless the authorization is terminated or revoked sooner. Performed at [facility], [address] [place], [place] [address]     Today   Subjective    Melanie Turner today is in her usual excellent spirits, is  smiling and laughing and eating her breakfast.  She does not really know where and when asked she states "while I am here or night".  Patient also was not sure who was been by to visit her but states "I imagine  it was my knees but I do not know".  Objective   Blood pressure 136/85, pulse 81, temperature 98.1 F (36.7 C), temperature source Oral, resp. rate 19, height 5\' 3"  (1.6 m), weight 54.4 kg, SpO2 99 %.   Intake/Output Summary (Last 24 hours) at 02/04/2020 1519 Last data filed at 02/04/2020 1000 Gross per 24 hour  Intake 140 ml  Output 550 ml  Net -410 ml    Exam Awake Alert, oriented to person only, pleasantly demented completely conversant.   Decreased air entry bilaterally likely secondary decrease in story effort RRR, S1-S2 +ve B.Sounds, Abd Soft, Non tender, No rebound -guarding or rigidity. No edema   Data Review   CBC w Diff:  Lab Results  Component Value Date   WBC 7.3 02/02/2020   HGB 8.5 (L) 02/02/2020   HGB 12.7 06/30/2014   HCT 25.7 (L) 02/02/2020   HCT 39.0 06/30/2014   PLT 210 02/02/2020   PLT 252 06/30/2014   LYMPHOPCT 24 03/10/2019   LYMPHOPCT 54.9 06/30/2014   MONOPCT 7 03/10/2019   MONOPCT 7.6 06/30/2014   EOSPCT 0 03/10/2019   EOSPCT 2.5 06/30/2014   BASOPCT 0 03/10/2019   BASOPCT 0.8 06/30/2014    CMP:  Lab Results  Component Value Date   NA 138 02/04/2020   NA 139 06/30/2014   K 4.0 02/04/2020   K 4.1 06/30/2014   CL 102 02/04/2020   CL 106 06/30/2014   CO2 29 02/04/2020   CO2 28 06/30/2014   BUN 13 02/04/2020   BUN 13 06/30/2014   CREATININE 0.49 02/04/2020   CREATININE 0.64 06/30/2014   PROT 7.9 01/28/2020   ALBUMIN 4.1 01/28/2020   BILITOT 1.6 (H) 01/28/2020   ALKPHOS 46 01/28/2020   AST 37 01/28/2020   ALT 23 01/28/2020  .   Total Time in preparing paper work, data evaluation and todays exam - 35 minutes  01/30/2020 M.D on 02/04/2020 at 3:19 PM  [facility]   Office  [phone]

## 2020-02-04 NOTE — TOC Transition Note (Signed)
Transition of Care [facility]) - CM/SW Discharge Note   Patient Details  Name: Melanie Turner MRN: [id number] Date of Birth: July 23, 1932  Transition of [facility]) CM/SW Contact:  Barrie Dunker, RN Phone Number: 02/04/2020, 3:48 PM   Clinical Narrative:     The patient was able to have a BM, The bedside nurse is getting the  patient dressed for DC and is ready for EMS this RNCM called EMS for non urgent transport    Barriers to Discharge: No Barriers Identified   Patient Goals and CMS Choice        Discharge Placement                       Discharge Plan and Services   Discharge Planning Services: CM Consult Post Acute Care Choice: Skilled Nursing Facility                               Social Determinants of Health (SDOH) Interventions     Readmission Risk Interventions No flowsheet data found.

## 2020-02-04 NOTE — Plan of Care (Signed)
  Problem: Clinical Measurements: Goal: Ability to maintain clinical measurements within normal limits will improve Outcome: Progressing Goal: Will remain free from infection Outcome: Progressing Note: No s/s of infection noted this shift. Goal: Respiratory complications will improve Outcome: Progressing

## 2020-02-05 ENCOUNTER — Emergency Department: Payer: Medicare HMO

## 2020-02-05 ENCOUNTER — Other Ambulatory Visit: Payer: Self-pay

## 2020-02-05 ENCOUNTER — Emergency Department
Admission: EM | Admit: 2020-02-05 | Discharge: 2020-02-06 | Disposition: A | Discharge status: Home / Self Care | Payer: Medicare HMO | Attending: Student in an Organized Health Care Education/Training Program | Admitting: Student in an Organized Health Care Education/Training Program

## 2020-02-05 DIAGNOSIS — E119 Type 2 diabetes mellitus without complications: Secondary | ICD-10-CM | POA: Insufficient documentation

## 2020-02-05 DIAGNOSIS — Z79899 Other long term (current) drug therapy: Secondary | ICD-10-CM | POA: Diagnosis not present

## 2020-02-05 DIAGNOSIS — F0390 Unspecified dementia without behavioral disturbance: Secondary | ICD-10-CM | POA: Diagnosis not present

## 2020-02-05 DIAGNOSIS — W19XXXD Unspecified fall, subsequent encounter: Secondary | ICD-10-CM | POA: Insufficient documentation

## 2020-02-05 DIAGNOSIS — W19XXXA Unspecified fall, initial encounter: Secondary | ICD-10-CM

## 2020-02-05 DIAGNOSIS — S72001D Fracture of unspecified part of neck of right femur, subsequent encounter for closed fracture with routine healing: Secondary | ICD-10-CM | POA: Diagnosis not present

## 2020-02-05 DIAGNOSIS — R102 Pelvic and perineal pain: Secondary | ICD-10-CM | POA: Diagnosis not present

## 2020-02-05 DIAGNOSIS — S3992XA Unspecified injury of lower back, initial encounter: Secondary | ICD-10-CM | POA: Diagnosis not present

## 2020-02-05 DIAGNOSIS — S199XXA Unspecified injury of neck, initial encounter: Secondary | ICD-10-CM | POA: Diagnosis not present

## 2020-02-05 DIAGNOSIS — S79921A Unspecified injury of right thigh, initial encounter: Secondary | ICD-10-CM | POA: Diagnosis not present

## 2020-02-05 DIAGNOSIS — I10 Essential (primary) hypertension: Secondary | ICD-10-CM | POA: Diagnosis not present

## 2020-02-05 DIAGNOSIS — R404 Transient alteration of awareness: Secondary | ICD-10-CM | POA: Diagnosis not present

## 2020-02-05 DIAGNOSIS — S72001A Fracture of unspecified part of neck of right femur, initial encounter for closed fracture: Secondary | ICD-10-CM | POA: Diagnosis not present

## 2020-02-05 DIAGNOSIS — S0990XA Unspecified injury of head, initial encounter: Secondary | ICD-10-CM | POA: Diagnosis not present

## 2020-02-05 DIAGNOSIS — S79922A Unspecified injury of left thigh, initial encounter: Secondary | ICD-10-CM | POA: Diagnosis not present

## 2020-02-05 NOTE — ED Provider Notes (Signed)
[facility] Emergency Department Provider Note  ____________________________________________  Time seen: Approximately 9:35 PM  I have reviewed the triage vital signs and the nursing notes.   HISTORY  Chief Complaint Fall    HPI Melanie Turner is a 86 y.o. female who presents the emergency department with granddaughter for complaint of repeat fall.  Patient had fallen 1 week ago, sustained a closed hip fracture that was surgically repaired.  Patient had been discharged to rehab facility.  Family members were visiting the patient earlier today, noticed multiple deficits in the patient's care and had addressed issues with staff members.  Family member reports that staff at the rehab facility were not interested in hearing or responding to the family's concerns.  Family members return later to find the patient in the floor.  Unknown mechanism whether patient hit her head or lost consciousness.  Patient was initially denying any complaints, however family was concerned as the fall a week ago patient was also denied complaints and hip fracture was found.  They bring her to the emergency department for repeat evaluation and family is requesting additional resources for placement in a different facility.  Patient is pleasantly confused, no complaints at this time.  Patient is oriented to self but not time or location or complaint.         Past Medical History:  Diagnosis Date  . Diabetes mellitus without complication (HCC)   . Hypertension     Patient Active Problem List   Diagnosis Date Noted  . UTI (urinary tract infection) 01/28/2020  . Dementia (HCC) 01/28/2020  . Hypokalemia 01/28/2020  . Fall at home, initial encounter 01/28/2020  . Closed right hip fracture (HCC) 01/28/2020  . Type II diabetes mellitus with renal manifestations (HCC) 03/14/2019  . Benign essential HTN 03/14/2019  . Seizure (HCC) 03/08/2019    Past Surgical History:  Procedure Laterality  Date  . HIP ARTHROPLASTY Right 01/29/2020   Procedure: ARTHROPLASTY BIPOLAR HIP (HEMIARTHROPLASTY);  Surgeon: Juanell Fairly, MD;  Location: [facility] ORS;  Service: Orthopedics;  Laterality: Right;  . THYROIDECTOMY      Prior to Admission medications   Medication Sig Start Date End Date Taking? Authorizing Provider  amLODipine (NORVASC) 5 MG tablet Take 1 tablet (5 mg total) by mouth daily. 02/05/20   Pieter Partridge, MD  donepezil (ARICEPT) 5 MG tablet Take 5 mg by mouth daily.  06/01/18   [provider]  metFORMIN (GLUCOPHAGE) 500 MG tablet Take 1 tablet (500 mg total) by mouth 2 (two) times daily with a meal for 30 days. Patient taking differently: Take 500 mg by mouth daily with breakfast.  03/14/19 01/28/20  Amin, Loura Halt, MD  mirtazapine (REMERON) 7.5 MG tablet Take 7.5 mg by mouth daily.  08/31/18   [provider]    Allergies Patient has no known allergies.  Family History  Problem Relation Age of Onset  . Cancer Brother        not know which type of cancer    Social History Social History   Tobacco Use  . Smoking status: Never Smoker  . Smokeless tobacco: Never Used  Substance Use Topics  . Alcohol use: Yes  . Drug use: No     Review of Systems presented by patient's family member Constitutional: No fever/chills.  Positive for fall. Eyes: No visual changes. No discharge ENT: No upper respiratory complaints. Cardiovascular: no chest pain. Respiratory: no cough. No SOB. Gastrointestinal: No abdominal pain.  No nausea, no vomiting.  No diarrhea.  No constipation. Musculoskeletal: Negative for musculoskeletal pain.  Known hip fracture. Skin: Negative for rash, abrasions, lacerations, ecchymosis. Neurological: Negative for headaches, focal weakness or numbness. 10-point ROS otherwise negative.  ____________________________________________   PHYSICAL EXAM:  VITAL SIGNS: ED Triage Vitals  Enc Vitals Group     BP 02/05/20 2004 (!)  142/74     Pulse Rate 02/05/20 2001 (!) 107     Resp 02/05/20 2001 20     Temp 02/05/20 2004 98.6 F (37 C)     Temp Source 02/05/20 2004 Oral     SpO2 02/05/20 2001 100 %     Weight 02/05/20 2002 119 lb 0.8 oz (54 kg)     Height 02/05/20 2002 5\' 3"  (1.6 m)     Head Circumference --      [place] Flow --      Pain Score 02/05/20 2002 0     Pain Loc --      Pain Edu? --      Excl. in GC? --      Constitutional: Alert and oriented. Well appearing and in no acute distress. Eyes: Conjunctivae are normal. PERRL. EOMI. Head: Atraumatic. ENT:      Ears:       Nose: No congestion/rhinnorhea.      Mouth/Throat: Mucous membranes are moist.  Neck: No stridor.  No cervical spine tenderness to palpation.  Cardiovascular: Normal rate, regular rhythm. Normal S1 and S2.  Good peripheral circulation. Respiratory: Normal respiratory effort without tachypnea or retractions. Lungs CTAB. Good air entry to the bases with no decreased or absent breath sounds. Gastrointestinal: Bowel sounds 4 quadrants. Soft and nontender to palpation. No guarding or rigidity. No palpable masses. No distention. No CVA tenderness. Musculoskeletal: Full range of motion to all extremities. No gross deformities appreciated.  Visualization of the right lower extremity reveals bandaging over surgical site.  No erythema, edema, ecchymosis.  Patient is moving her knee and toes on the right foot appropriately.  No gross movement of the right hip is appreciated on exam.  Patient denies any tenderness to palpation diffusely over the remaining musculoskeletal structures of the upper and lower extremities.  Patient has no tenderness.  No palpable abnormality.  No visualized edema, ecchymosis, deformities.  Radial pulse intact bilateral upper extremities.  Dorsalis pedis pulse intact bilateral lower extremities.  Patient endorses sensation in all extremities equally. Neurologic:  Normal speech and language. No gross focal neurologic deficits  are appreciated.  Skin:  Skin is warm, dry and intact. No rash noted. Psychiatric: Mood and affect are normal. Speech and behavior are normal. Patient is pleasantly confused.  She is alert but not oriented.  ____________________________________________   LABS (all labs ordered are listed, but only abnormal results are displayed)  Labs Reviewed - No data to display ____________________________________________  EKG   ____________________________________________  RADIOLOGY I personally viewed and evaluated these images as part of my medical decision making, as well as reviewing the written report by the radiologist.  DG Lumbar Spine 2-3 Views  Result Date: 02/05/2020 CLINICAL DATA:  Status post fall. EXAM: LUMBAR SPINE - 2-3 VIEW COMPARISON:  None. FINDINGS: There is no evidence of acute lumbar spine fracture. Alignment is normal. Mild-to-moderate severity multilevel endplate sclerosis is seen. Mild intervertebral disc space narrowing is seen at the levels of L1-L2 and L2-L3. IMPRESSION: 1. No evidence of acute lumbar spine fracture or subluxation. 2. Moderate severity multilevel degenerative changes. Electronically Signed   By: 04/06/2020.D.  On: 02/05/2020 23:16   DG Pelvis 1-2 Views  Result Date: 02/05/2020 CLINICAL DATA:  Pain status post fall EXAM: PELVIS - 1-2 VIEW COMPARISON:  September 28, 2015 FINDINGS: The patient is status post prior total hip arthroplasty on the right. The hardware appears grossly intact where visualized. Skin staples project over the patient's right hip. There is osteopenia without evidence for an acute displaced fracture or dislocation. There are mild-to-moderate degenerative changes of the left hip. IMPRESSION: 1. No acute displaced fracture or dislocation. 2. Status post right total hip arthroplasty without evidence for hardware fracture or failure. Electronically Signed   By: Katherine Mantle M.D.   On: 02/05/2020 23:16   CT Head Wo  Contrast  Result Date: 02/05/2020 CLINICAL DATA:  Status post fall. EXAM: CT HEAD WITHOUT CONTRAST TECHNIQUE: Contiguous axial images were obtained from the base of the skull through the vertex without intravenous contrast. COMPARISON:  January 28, 2020 FINDINGS: Brain: There is moderate severity cerebral atrophy with widening of the extra-axial spaces and ventricular dilatation. There are areas of decreased attenuation within the white matter tracts of the supratentorial brain, consistent with microvascular disease changes. There is mild symmetric bilateral basal ganglia calcification. Vascular: No hyperdense vessel or unexpected calcification. Skull: Normal. Negative for fracture or focal lesion. Sinuses/Orbits: No acute finding. Other: None. IMPRESSION: 1. No acute intracranial abnormality. 2. Moderate severity cerebral atrophy and microvascular disease changes of the supratentorial brain. Electronically Signed   By: Aram Candela M.D.   On: 02/05/2020 22:56   CT Cervical Spine Wo Contrast  Result Date: 02/05/2020 CLINICAL DATA:  Status post fall. EXAM: CT CERVICAL SPINE WITHOUT CONTRAST TECHNIQUE: Multidetector CT imaging of the cervical spine was performed without intravenous contrast. Multiplanar CT image reconstructions were also generated. COMPARISON:  September 05, 2018 FINDINGS: Alignment: Normal. Skull base and vertebrae: No acute fracture. No primary bone lesion or focal pathologic process. Soft tissues and spinal canal: No prevertebral fluid or swelling. No visible canal hematoma. Disc levels: Mild endplate sclerosis is seen at the levels of C2-C3 and C5-C6. Marked severity posterior intervertebral disc space narrowing is also seen at the level of C5-C6. Upper chest: Negative. Other: None. IMPRESSION: 1. No acute fracture within the cervical spine. 2. Stable, marked severity posterior intervertebral disc space narrowing at the level of C5-C6. Electronically Signed   By: Aram Candela M.D.   On:  02/05/2020 23:00   DG Femur Min 2 Views Left  Result Date: 02/05/2020 CLINICAL DATA:  Pain status post fall EXAM: LEFT FEMUR 2 VIEWS COMPARISON:  None. FINDINGS: There is no evidence of fracture or other focal bone lesions. Soft tissues are unremarkable. IMPRESSION: Negative. Electronically Signed   By: Katherine Mantle M.D.   On: 02/05/2020 23:18   DG Femur Min 2 Views Right  Result Date: 02/05/2020 CLINICAL DATA:  Pain status post fall EXAM: RIGHT FEMUR 2 VIEWS COMPARISON:  January 29, 2020 FINDINGS: The patient is status post total hip arthroplasty on the right. The hardware appears intact. The alignment is appropriate. There are overlying skin staples. There is overlying soft tissue swelling which is favored to be secondary to recent surgical intervention. There is no evidence for an acute displaced fracture. There is a chronic appearing calcified structure in the posteromedial soft tissues at the level of the knee. This may be related to an old remote injury. IMPRESSION: 1. No evidence for an acute displaced fracture. 2. Status post total hip arthroplasty on the right. Electronically Signed   By: Cristal Deer  Green M.D.   On: 02/05/2020 23:17    ____________________________________________    PROCEDURES  Procedure(s) performed:    Procedures    Medications - No data to display   ____________________________________________   INITIAL IMPRESSION / ASSESSMENT AND PLAN / ED COURSE  Pertinent labs & imaging results that were available during my care of the patient were reviewed by me and considered in my medical decision making (see chart for details).  Review of the [place] CSRS was performed in accordance of the [place] prior to dispensing any controlled drugs.           Patient's diagnosis is consistent with fall, dementia, routine healing of closed hip fracture.  Patient presented to the emergency department family member after was found that patient had fallen while at rehab  facility.  According to the family member present with the patient, patient is pleasantly confused with dementia, recently sustained a hip fracture to the right hip after a fall.  Family members were visiting the patient today, noticed multiple deficits in the patient's care while in the facility.  The addressed issues with the staff, however they returned later to find the patient had fallen before without staff being aware.  Thankfully there is no indication of additional injuries with reassuring imaging at this time.  Patient had no complaints at this time.  Patient's family are requesting that we involve social work for placement at a different facility than where patient currently resides.  Patient will be boarded, social work and PT consult pending..     ____________________________________________  FINAL CLINICAL IMPRESSION(S) / ED DIAGNOSES  Final diagnoses:  Fall, initial encounter  Dementia without behavioral disturbance, unspecified dementia type ([place])  Closed fracture of right hip with routine healing, subsequent encounter      NEW MEDICATIONS STARTED DURING THIS VISIT:  ED Discharge Orders    None          This chart was dictated using voice recognition software/Dragon. Despite best efforts to proofread, errors can occur which can change the meaning. Any change was purely unintentional.    Darletta Moll, PA-C 02/05/20 2343    Merlyn Lot, MD 02/08/20 [phone]

## 2020-02-05 NOTE — ED Triage Notes (Signed)
Pt to ED via [facility] from [facility] for chief complaint of fall out of wheelchair. Pt hx of dementia, oriented to person only and poor historian.  Pt denies pain at this time, reports does not remember falling.  No blood thinner use reported. Not reported to have hit head.  Hx diabetes, HTN

## 2020-02-06 LAB — GLUCOSE, CAPILLARY: Glucose-Capillary: 187 mg/dL — ABNORMAL HIGH (ref 70–99)

## 2020-02-06 MED ORDER — MIRTAZAPINE 15 MG PO TABS
7.5000 mg | ORAL_TABLET | Freq: Every day | ORAL | Status: DC
  Administered 2020-02-06: 7.5 mg via ORAL
  Filled 2020-02-06: qty 1

## 2020-02-06 MED ORDER — CLOTRIMAZOLE 1 % EX CREA
TOPICAL_CREAM | Freq: Two times a day (BID) | CUTANEOUS | Status: DC
  Filled 2020-02-06: qty 15

## 2020-02-06 MED ORDER — ENOXAPARIN SODIUM 40 MG/0.4ML ~~LOC~~ SOLN
40.0000 mg | SUBCUTANEOUS | Status: DC
  Administered 2020-02-06: 40 mg via SUBCUTANEOUS
  Filled 2020-02-06: qty 0.4

## 2020-02-06 MED ORDER — METFORMIN HCL 500 MG PO TABS
500.0000 mg | ORAL_TABLET | Freq: Two times a day (BID) | ORAL | Status: DC
  Administered 2020-02-06: 500 mg via ORAL
  Filled 2020-02-06: qty 1

## 2020-02-06 MED ORDER — AMLODIPINE BESYLATE 5 MG PO TABS
5.0000 mg | ORAL_TABLET | Freq: Every day | ORAL | Status: DC
  Administered 2020-02-06: 5 mg via ORAL
  Filled 2020-02-06: qty 1

## 2020-02-06 MED ORDER — DONEPEZIL HCL 5 MG PO TABS
5.0000 mg | ORAL_TABLET | Freq: Every day | ORAL | Status: DC
  Administered 2020-02-06: 5 mg via ORAL
  Filled 2020-02-06: qty 1

## 2020-02-06 NOTE — ED Provider Notes (Signed)
  Patient was pending new placement given family was concerned about the facility the patient was placed into.  Is been decided with family that patient will go home with home hospice with [facility], Alben Spittle, MD 02/06/20 1415

## 2020-02-06 NOTE — TOC Progression Note (Signed)
Transition of Care [facility]) - Progression Note    Patient Details  Name: [name] MRN: [id number] Date of Birth: [birth year]  Transition of Care [facility]) CM/SW Contact  [place] Cellar, RN Phone Number: 02/06/2020, 2:22 PM  Clinical Narrative:    Spoke with son at bedside and explained daughters request to discharge home with [facility]. Son states he lives with the patient and provides care 24/7. Discussed hospice in detail and son in agreeance with plan to dc home with Hospice. Son will transport patient home. Alvino Chapel with [facility], ED MD, PT and ED RN notified of discharge disposition.    Expected Discharge Plan: Skilled Nursing Facility Barriers to Discharge: Continued Medical Work up  Expected Discharge Plan and Services Expected Discharge Plan: Skilled Nursing Facility   Discharge Planning Services: CM Consult Post Acute Care Choice: Skilled Nursing Facility Living arrangements for the past 2 months: Skilled Nursing Facility                                       Social Determinants of Health (SDOH) Interventions    Readmission Risk Interventions No flowsheet data found.

## 2020-02-06 NOTE — NC FL2 (Signed)
[place] MEDICAID FL2 LEVEL OF CARE SCREENING TOOL     IDENTIFICATION  [name] Birthdate: [birth year] Sex: female Admission Date (Current Location): 02/05/2020  [facility] and [place] Number:  Engineer, civil (consulting) and Address:  [facility], [address], [place], [place] [address]      Provider Number: [id number]  Attending Physician Name and Address:  No att. providers found  Relative Name and Phone Number:       Current Level of Care: Hospital Recommended Level of Care: Skilled Nursing Facility Prior Approval Number:    Date Approved/Denied:   PASRR Number: [id number]  Discharge Plan: SNF    Current Diagnoses: Patient Active Problem List   Diagnosis Date Noted  . UTI (urinary tract infection) 01/28/2020  . Dementia (HCC) 01/28/2020  . Hypokalemia 01/28/2020  . Fall at home, initial encounter 01/28/2020  . Closed right hip fracture (HCC) 01/28/2020  . Type II diabetes mellitus with renal manifestations (HCC) 03/14/2019  . Benign essential HTN 03/14/2019  . Seizure (HCC) 03/08/2019    Orientation RESPIRATION BLADDER Height & Weight     Self  Normal Incontinent Weight: 54 kg Height:  5\' 3"  (160 cm)  BEHAVIORAL SYMPTOMS/MOOD NEUROLOGICAL BOWEL NUTRITION STATUS      Incontinent Diet(Regular)  AMBULATORY STATUS COMMUNICATION OF NEEDS Skin   Total Care Verbally Normal                       Personal Care Assistance Level of Assistance  Bathing, Feeding, Dressing Bathing Assistance: Maximum assistance Feeding assistance: Limited assistance Dressing Assistance: Maximum assistance     Functional Limitations Info  Hearing   Hearing Info: Impaired(Hard of Hearing)      SPECIAL CARE FACTORS FREQUENCY  PT (By licensed PT), OT (By licensed OT)     PT Frequency: MIn 5xweekly OT Frequency: Min 5xweekly            Contractures Contractures Info: Not present    Additional Factors Info  Code Status  Code Status Info: DNR             Current Medications (02/06/2020):  This is the current hospital active medication list Current Facility-Administered Medications  Medication Dose Route Frequency Provider Last Rate Last Admin  . amLODipine (NORVASC) tablet 5 mg  5 mg Oral Daily 04/07/2020, MD      . clotrimazole (LOTRIMIN) 1 % cream   Topical BID Loleta Rose, MD      . donepezil (ARICEPT) tablet 5 mg  5 mg Oral Daily Loleta Rose, MD      . enoxaparin (LOVENOX) injection 40 mg  40 mg Subcutaneous Q24H Loleta Rose, MD      . metFORMIN (GLUCOPHAGE) tablet 500 mg  500 mg Oral BID WC Loleta Rose, MD      . mirtazapine (REMERON) tablet 7.5 mg  7.5 mg Oral Daily Loleta Rose, MD       Current Outpatient Medications  Medication Sig Dispense Refill  . amLODipine (NORVASC) 5 MG tablet Take 1 tablet (5 mg total) by mouth daily. 30 tablet 0  . clotrimazole-betamethasone (LOTRISONE) cream Apply 1 application topically 2 (two) times daily.    Loleta Rose donepezil (ARICEPT) 5 MG tablet Take 5 mg by mouth daily.     Marland Kitchen enoxaparin (LOVENOX) 40 MG/0.4ML injection Inject 40 mg into the skin daily.    . Infant Care Products (DERMACLOUD EX) Apply 1 application topically 2 (two) times daily. To sacrum and buttocks for nonspecific  rash.    . metFORMIN (GLUCOPHAGE) 500 MG tablet Take 1 tablet (500 mg total) by mouth 2 (two) times daily with a meal for 30 days. 60 tablet 0  . mirtazapine (REMERON) 7.5 MG tablet Take 7.5 mg by mouth daily.        Discharge Medications: Please see discharge summary for a list of discharge medications.  Relevant Imaging Results:  Relevant Lab Results:   Additional Information SS#[id number]  Anselm Pancoast, RN

## 2020-02-06 NOTE — ED Notes (Signed)
Pt's daughter Melanie Turner updated on pt's status and information given to daughter to the best of this RN's ability

## 2020-02-06 NOTE — ED Notes (Signed)
Pt resting with eyes closed, equal rise and fall of chest observed by this RN 

## 2020-02-06 NOTE — ED Notes (Signed)
Pt sleeping quietly

## 2020-02-06 NOTE — Discharge Instructions (Signed)
Patient is being discharged home with hospice with [facility].

## 2020-02-06 NOTE — ED Notes (Signed)
Son at bedside.

## 2020-02-06 NOTE — ED Notes (Signed)
Patient given breakfast tray.

## 2020-02-06 NOTE — TOC Progression Note (Signed)
Transition of Care [facility]) - Progression Note    Patient Details  Name: Melanie Turner MRN: [id number] Date of Birth: 05-14-1932  Transition [facility] Care [facility]) CM/SW Contact  [place] Cellar, RN Phone Number: 02/06/2020, 1:27 PM  Clinical Narrative:    Sherron Monday to [employer], daughter with bed offers from [facility]. Daughter is concerned about distance and is debating on taking patient home with [facility] for additional resources. Daughter requested Alvino Chapel with [facility] please call her for additional questions. RN CM outreached to [place] and confirmed daughters Lexicographer and request. Alvino Chapel states she will outreach to daughter and update RN CM with final decision on disposition.   Expected Discharge Plan: Skilled Nursing Facility Barriers to Discharge: Continued Medical Work up  Expected Discharge Plan and Services Expected Discharge Plan: Skilled Nursing Facility   Discharge Planning Services: CM Consult Post Acute Care Choice: Skilled Nursing Facility Living arrangements for the past 2 months: Skilled Nursing Facility                                       Social Determinants of Health (SDOH) Interventions    Readmission Risk Interventions No flowsheet data found.

## 2020-02-06 NOTE — ED Notes (Signed)
Informed son of information given to me by Child psychotherapist. Son seems very frustrated regarding decision to take patient home, informed him that I would ask the social worker to come and talk to him

## 2020-02-06 NOTE — TOC Initial Note (Signed)
Transition of Care [facility]) - Initial/Assessment Note    Patient Details  Name: Melanie Turner MRN: [id number] Date of Birth: 10-04-1932  [facility]) CM/SW Contact:    [place] Cellar, RN Phone Number: 02/06/2020, 10:40 AM  Clinical Narrative:                 Spoke to [employer], daughter, who expressed concerns regarding care received at [facility] and is requesting alternative placement. Daughter states she started Medicaid application a year ago but does not know what happened with it. Daughter is requesting [facility] however family is refusing COVID vaccine which is a requirement for admission. Family second choice is [facility] although they are open to other options. Family is interested in [facility] for additional resources at [facility]. RN CM contacted Alvino Chapel at [facility] who will verify what facilities they are open to work in.  Expected Discharge Plan: Skilled Nursing Facility Barriers to Discharge: Continued Medical Work up   Patient Goals and CMS Choice Patient states their goals for this hospitalization and ongoing recovery are:: Find placement for rehab   Choice offered to / list presented to : Adult Children  Expected Discharge Plan and Services Expected Discharge Plan: Skilled Nursing Facility   Discharge Planning Services: CM Consult Post Acute Care Choice: Skilled Nursing Facility Living arrangements for the past 2 months: Skilled Nursing Facility                                      Prior Living Arrangements/Services Living arrangements for the past 2 months: Skilled Nursing Facility Lives with:: Facility Resident(Recent admission to [facility]) Patient language and need for interpreter reviewed:: Yes Do you feel safe going back to the place where you live?: No   Family does not want patient returning to [facility]  Need for Family Participation in Patient Care: Yes (Comment) Care giver support system in place?: Yes (comment) Current home services:  DME(walker) Criminal Activity/Legal Involvement Pertinent to Current Situation/Hospitalization: No - Comment as needed  Activities of Daily Living      Permission Sought/Granted Permission sought to share information with : Family Supports Permission granted to share information with : Yes, Verbal Permission Granted  Share Information with NAME: Alvino Chapel  Permission granted to share info w AGENCY: [facility]        Emotional Assessment Appearance:: Appears stated age Attitude/Demeanor/Rapport: Unable to Assess Affect (typically observed): Unable to Assess Orientation: : Oriented to Self Alcohol / Substance Use: Never Used Psych Involvement: No (comment)  Admission diagnosis:  Fall Patient Active Problem List   Diagnosis Date Noted  . UTI (urinary tract infection) 01/28/2020  . Dementia (HCC) 01/28/2020  . Hypokalemia 01/28/2020  . Fall at home, initial encounter 01/28/2020  . Closed right hip fracture (HCC) 01/28/2020  . Type II diabetes mellitus with renal manifestations (HCC) 03/14/2019  . Benign essential HTN 03/14/2019  . Seizure (HCC) 03/08/2019   PCP:  Lauro Regulus, MD Pharmacy:   [facility] [phone] [place] Rough, [place] - [address] [place] [address] Phone: [phone] Fax: [phone]  [facility] [place], [place] - [address] [place] [address] Phone: [phone] Fax: [phone]     Social Determinants of Health (SDOH) Interventions    Readmission Risk Interventions No flowsheet data found.

## 2020-02-06 NOTE — ED Notes (Signed)
Pt resting with eyes closed at this time, pt has not eaten any of breakfast yet

## 2020-02-06 NOTE — ED Notes (Signed)
Pt's brief observed to be soiled with urine. Pt taken to empty exam room and brief changed and pt wiped with bath wipes. Crumbs cleaned up from patient's bed and patient given clean and warm blankets

## 2020-02-21 DIAGNOSIS — Z96641 Presence of right artificial hip joint: Secondary | ICD-10-CM | POA: Diagnosis not present

## 2020-02-21 DIAGNOSIS — Z09 Encounter for follow-up examination after completed treatment for conditions other than malignant neoplasm: Secondary | ICD-10-CM | POA: Diagnosis not present

## 2020-02-26 DIAGNOSIS — E119 Type 2 diabetes mellitus without complications: Secondary | ICD-10-CM | POA: Diagnosis not present

## 2020-02-26 DIAGNOSIS — E876 Hypokalemia: Secondary | ICD-10-CM | POA: Diagnosis not present

## 2020-02-26 DIAGNOSIS — M1990 Unspecified osteoarthritis, unspecified site: Secondary | ICD-10-CM | POA: Diagnosis not present

## 2020-02-26 DIAGNOSIS — F0280 Dementia in other diseases classified elsewhere without behavioral disturbance: Secondary | ICD-10-CM | POA: Diagnosis not present

## 2020-02-26 DIAGNOSIS — G309 Alzheimer's disease, unspecified: Secondary | ICD-10-CM | POA: Diagnosis not present

## 2020-02-26 DIAGNOSIS — F329 Major depressive disorder, single episode, unspecified: Secondary | ICD-10-CM | POA: Diagnosis not present

## 2020-02-26 DIAGNOSIS — I10 Essential (primary) hypertension: Secondary | ICD-10-CM | POA: Diagnosis not present

## 2020-02-26 DIAGNOSIS — F419 Anxiety disorder, unspecified: Secondary | ICD-10-CM | POA: Diagnosis not present

## 2020-02-26 DIAGNOSIS — S72011D Unspecified intracapsular fracture of right femur, subsequent encounter for closed fracture with routine healing: Secondary | ICD-10-CM | POA: Diagnosis not present

## 2020-02-28 DIAGNOSIS — Z09 Encounter for follow-up examination after completed treatment for conditions other than malignant neoplasm: Secondary | ICD-10-CM | POA: Diagnosis not present

## 2020-02-28 DIAGNOSIS — Z96641 Presence of right artificial hip joint: Secondary | ICD-10-CM | POA: Diagnosis not present

## 2020-03-03 DIAGNOSIS — M1990 Unspecified osteoarthritis, unspecified site: Secondary | ICD-10-CM | POA: Diagnosis not present

## 2020-03-03 DIAGNOSIS — G309 Alzheimer's disease, unspecified: Secondary | ICD-10-CM | POA: Diagnosis not present

## 2020-03-03 DIAGNOSIS — F0280 Dementia in other diseases classified elsewhere without behavioral disturbance: Secondary | ICD-10-CM | POA: Diagnosis not present

## 2020-03-03 DIAGNOSIS — F419 Anxiety disorder, unspecified: Secondary | ICD-10-CM | POA: Diagnosis not present

## 2020-03-03 DIAGNOSIS — E119 Type 2 diabetes mellitus without complications: Secondary | ICD-10-CM | POA: Diagnosis not present

## 2020-03-03 DIAGNOSIS — I10 Essential (primary) hypertension: Secondary | ICD-10-CM | POA: Diagnosis not present

## 2020-03-03 DIAGNOSIS — E876 Hypokalemia: Secondary | ICD-10-CM | POA: Diagnosis not present

## 2020-03-03 DIAGNOSIS — S72011D Unspecified intracapsular fracture of right femur, subsequent encounter for closed fracture with routine healing: Secondary | ICD-10-CM | POA: Diagnosis not present

## 2020-03-03 DIAGNOSIS — F329 Major depressive disorder, single episode, unspecified: Secondary | ICD-10-CM | POA: Diagnosis not present

## 2020-03-04 DIAGNOSIS — E119 Type 2 diabetes mellitus without complications: Secondary | ICD-10-CM | POA: Diagnosis not present

## 2020-03-04 DIAGNOSIS — F0280 Dementia in other diseases classified elsewhere without behavioral disturbance: Secondary | ICD-10-CM | POA: Diagnosis not present

## 2020-03-04 DIAGNOSIS — G309 Alzheimer's disease, unspecified: Secondary | ICD-10-CM | POA: Diagnosis not present

## 2020-03-04 DIAGNOSIS — E876 Hypokalemia: Secondary | ICD-10-CM | POA: Diagnosis not present

## 2020-03-04 DIAGNOSIS — M1990 Unspecified osteoarthritis, unspecified site: Secondary | ICD-10-CM | POA: Diagnosis not present

## 2020-03-04 DIAGNOSIS — F419 Anxiety disorder, unspecified: Secondary | ICD-10-CM | POA: Diagnosis not present

## 2020-03-04 DIAGNOSIS — S72011D Unspecified intracapsular fracture of right femur, subsequent encounter for closed fracture with routine healing: Secondary | ICD-10-CM | POA: Diagnosis not present

## 2020-03-04 DIAGNOSIS — I10 Essential (primary) hypertension: Secondary | ICD-10-CM | POA: Diagnosis not present

## 2020-03-04 DIAGNOSIS — F329 Major depressive disorder, single episode, unspecified: Secondary | ICD-10-CM | POA: Diagnosis not present

## 2020-03-05 DIAGNOSIS — F329 Major depressive disorder, single episode, unspecified: Secondary | ICD-10-CM | POA: Diagnosis not present

## 2020-03-05 DIAGNOSIS — E876 Hypokalemia: Secondary | ICD-10-CM | POA: Diagnosis not present

## 2020-03-05 DIAGNOSIS — G309 Alzheimer's disease, unspecified: Secondary | ICD-10-CM | POA: Diagnosis not present

## 2020-03-05 DIAGNOSIS — E119 Type 2 diabetes mellitus without complications: Secondary | ICD-10-CM | POA: Diagnosis not present

## 2020-03-05 DIAGNOSIS — M1990 Unspecified osteoarthritis, unspecified site: Secondary | ICD-10-CM | POA: Diagnosis not present

## 2020-03-05 DIAGNOSIS — F419 Anxiety disorder, unspecified: Secondary | ICD-10-CM | POA: Diagnosis not present

## 2020-03-05 DIAGNOSIS — F0280 Dementia in other diseases classified elsewhere without behavioral disturbance: Secondary | ICD-10-CM | POA: Diagnosis not present

## 2020-03-05 DIAGNOSIS — I10 Essential (primary) hypertension: Secondary | ICD-10-CM | POA: Diagnosis not present

## 2020-03-05 DIAGNOSIS — S72011D Unspecified intracapsular fracture of right femur, subsequent encounter for closed fracture with routine healing: Secondary | ICD-10-CM | POA: Diagnosis not present

## 2020-03-06 DIAGNOSIS — M1990 Unspecified osteoarthritis, unspecified site: Secondary | ICD-10-CM | POA: Diagnosis not present

## 2020-03-06 DIAGNOSIS — F0280 Dementia in other diseases classified elsewhere without behavioral disturbance: Secondary | ICD-10-CM | POA: Diagnosis not present

## 2020-03-06 DIAGNOSIS — E876 Hypokalemia: Secondary | ICD-10-CM | POA: Diagnosis not present

## 2020-03-06 DIAGNOSIS — S72011D Unspecified intracapsular fracture of right femur, subsequent encounter for closed fracture with routine healing: Secondary | ICD-10-CM | POA: Diagnosis not present

## 2020-03-06 DIAGNOSIS — G309 Alzheimer's disease, unspecified: Secondary | ICD-10-CM | POA: Diagnosis not present

## 2020-03-06 DIAGNOSIS — F419 Anxiety disorder, unspecified: Secondary | ICD-10-CM | POA: Diagnosis not present

## 2020-03-06 DIAGNOSIS — I10 Essential (primary) hypertension: Secondary | ICD-10-CM | POA: Diagnosis not present

## 2020-03-06 DIAGNOSIS — E119 Type 2 diabetes mellitus without complications: Secondary | ICD-10-CM | POA: Diagnosis not present

## 2020-03-06 DIAGNOSIS — F329 Major depressive disorder, single episode, unspecified: Secondary | ICD-10-CM | POA: Diagnosis not present

## 2020-03-09 DIAGNOSIS — E876 Hypokalemia: Secondary | ICD-10-CM | POA: Diagnosis not present

## 2020-03-09 DIAGNOSIS — M1990 Unspecified osteoarthritis, unspecified site: Secondary | ICD-10-CM | POA: Diagnosis not present

## 2020-03-09 DIAGNOSIS — F329 Major depressive disorder, single episode, unspecified: Secondary | ICD-10-CM | POA: Diagnosis not present

## 2020-03-09 DIAGNOSIS — G309 Alzheimer's disease, unspecified: Secondary | ICD-10-CM | POA: Diagnosis not present

## 2020-03-09 DIAGNOSIS — S72011D Unspecified intracapsular fracture of right femur, subsequent encounter for closed fracture with routine healing: Secondary | ICD-10-CM | POA: Diagnosis not present

## 2020-03-09 DIAGNOSIS — F0280 Dementia in other diseases classified elsewhere without behavioral disturbance: Secondary | ICD-10-CM | POA: Diagnosis not present

## 2020-03-09 DIAGNOSIS — E119 Type 2 diabetes mellitus without complications: Secondary | ICD-10-CM | POA: Diagnosis not present

## 2020-03-09 DIAGNOSIS — I10 Essential (primary) hypertension: Secondary | ICD-10-CM | POA: Diagnosis not present

## 2020-03-09 DIAGNOSIS — F419 Anxiety disorder, unspecified: Secondary | ICD-10-CM | POA: Diagnosis not present

## 2020-03-12 DIAGNOSIS — F0280 Dementia in other diseases classified elsewhere without behavioral disturbance: Secondary | ICD-10-CM | POA: Diagnosis not present

## 2020-03-12 DIAGNOSIS — E119 Type 2 diabetes mellitus without complications: Secondary | ICD-10-CM | POA: Diagnosis not present

## 2020-03-12 DIAGNOSIS — G309 Alzheimer's disease, unspecified: Secondary | ICD-10-CM | POA: Diagnosis not present

## 2020-03-12 DIAGNOSIS — I10 Essential (primary) hypertension: Secondary | ICD-10-CM | POA: Diagnosis not present

## 2020-03-12 DIAGNOSIS — S72011D Unspecified intracapsular fracture of right femur, subsequent encounter for closed fracture with routine healing: Secondary | ICD-10-CM | POA: Diagnosis not present

## 2020-03-12 DIAGNOSIS — M1990 Unspecified osteoarthritis, unspecified site: Secondary | ICD-10-CM | POA: Diagnosis not present

## 2020-03-12 DIAGNOSIS — F419 Anxiety disorder, unspecified: Secondary | ICD-10-CM | POA: Diagnosis not present

## 2020-03-12 DIAGNOSIS — F329 Major depressive disorder, single episode, unspecified: Secondary | ICD-10-CM | POA: Diagnosis not present

## 2020-03-12 DIAGNOSIS — E876 Hypokalemia: Secondary | ICD-10-CM | POA: Diagnosis not present

## 2020-03-13 DIAGNOSIS — F0280 Dementia in other diseases classified elsewhere without behavioral disturbance: Secondary | ICD-10-CM | POA: Diagnosis not present

## 2020-03-13 DIAGNOSIS — M1990 Unspecified osteoarthritis, unspecified site: Secondary | ICD-10-CM | POA: Diagnosis not present

## 2020-03-13 DIAGNOSIS — S72011D Unspecified intracapsular fracture of right femur, subsequent encounter for closed fracture with routine healing: Secondary | ICD-10-CM | POA: Diagnosis not present

## 2020-03-13 DIAGNOSIS — G309 Alzheimer's disease, unspecified: Secondary | ICD-10-CM | POA: Diagnosis not present

## 2020-03-13 DIAGNOSIS — F419 Anxiety disorder, unspecified: Secondary | ICD-10-CM | POA: Diagnosis not present

## 2020-03-13 DIAGNOSIS — E119 Type 2 diabetes mellitus without complications: Secondary | ICD-10-CM | POA: Diagnosis not present

## 2020-03-13 DIAGNOSIS — F329 Major depressive disorder, single episode, unspecified: Secondary | ICD-10-CM | POA: Diagnosis not present

## 2020-03-13 DIAGNOSIS — E876 Hypokalemia: Secondary | ICD-10-CM | POA: Diagnosis not present

## 2020-03-13 DIAGNOSIS — I10 Essential (primary) hypertension: Secondary | ICD-10-CM | POA: Diagnosis not present

## 2020-03-16 DIAGNOSIS — M1990 Unspecified osteoarthritis, unspecified site: Secondary | ICD-10-CM | POA: Diagnosis not present

## 2020-03-16 DIAGNOSIS — E119 Type 2 diabetes mellitus without complications: Secondary | ICD-10-CM | POA: Diagnosis not present

## 2020-03-16 DIAGNOSIS — I10 Essential (primary) hypertension: Secondary | ICD-10-CM | POA: Diagnosis not present

## 2020-03-16 DIAGNOSIS — G309 Alzheimer's disease, unspecified: Secondary | ICD-10-CM | POA: Diagnosis not present

## 2020-03-16 DIAGNOSIS — F329 Major depressive disorder, single episode, unspecified: Secondary | ICD-10-CM | POA: Diagnosis not present

## 2020-03-16 DIAGNOSIS — S72011D Unspecified intracapsular fracture of right femur, subsequent encounter for closed fracture with routine healing: Secondary | ICD-10-CM | POA: Diagnosis not present

## 2020-03-16 DIAGNOSIS — E876 Hypokalemia: Secondary | ICD-10-CM | POA: Diagnosis not present

## 2020-03-16 DIAGNOSIS — F419 Anxiety disorder, unspecified: Secondary | ICD-10-CM | POA: Diagnosis not present

## 2020-03-16 DIAGNOSIS — F0280 Dementia in other diseases classified elsewhere without behavioral disturbance: Secondary | ICD-10-CM | POA: Diagnosis not present

## 2020-03-20 DIAGNOSIS — M1990 Unspecified osteoarthritis, unspecified site: Secondary | ICD-10-CM | POA: Diagnosis not present

## 2020-03-20 DIAGNOSIS — F419 Anxiety disorder, unspecified: Secondary | ICD-10-CM | POA: Diagnosis not present

## 2020-03-20 DIAGNOSIS — I10 Essential (primary) hypertension: Secondary | ICD-10-CM | POA: Diagnosis not present

## 2020-03-20 DIAGNOSIS — E119 Type 2 diabetes mellitus without complications: Secondary | ICD-10-CM | POA: Diagnosis not present

## 2020-03-20 DIAGNOSIS — E876 Hypokalemia: Secondary | ICD-10-CM | POA: Diagnosis not present

## 2020-03-20 DIAGNOSIS — G309 Alzheimer's disease, unspecified: Secondary | ICD-10-CM | POA: Diagnosis not present

## 2020-03-20 DIAGNOSIS — F329 Major depressive disorder, single episode, unspecified: Secondary | ICD-10-CM | POA: Diagnosis not present

## 2020-03-20 DIAGNOSIS — S72011D Unspecified intracapsular fracture of right femur, subsequent encounter for closed fracture with routine healing: Secondary | ICD-10-CM | POA: Diagnosis not present

## 2020-03-20 DIAGNOSIS — F0280 Dementia in other diseases classified elsewhere without behavioral disturbance: Secondary | ICD-10-CM | POA: Diagnosis not present

## 2020-03-23 DIAGNOSIS — I10 Essential (primary) hypertension: Secondary | ICD-10-CM | POA: Diagnosis not present

## 2020-03-23 DIAGNOSIS — F419 Anxiety disorder, unspecified: Secondary | ICD-10-CM | POA: Diagnosis not present

## 2020-03-23 DIAGNOSIS — S72011D Unspecified intracapsular fracture of right femur, subsequent encounter for closed fracture with routine healing: Secondary | ICD-10-CM | POA: Diagnosis not present

## 2020-03-23 DIAGNOSIS — G309 Alzheimer's disease, unspecified: Secondary | ICD-10-CM | POA: Diagnosis not present

## 2020-03-23 DIAGNOSIS — F0280 Dementia in other diseases classified elsewhere without behavioral disturbance: Secondary | ICD-10-CM | POA: Diagnosis not present

## 2020-03-23 DIAGNOSIS — M1990 Unspecified osteoarthritis, unspecified site: Secondary | ICD-10-CM | POA: Diagnosis not present

## 2020-03-23 DIAGNOSIS — F329 Major depressive disorder, single episode, unspecified: Secondary | ICD-10-CM | POA: Diagnosis not present

## 2020-03-23 DIAGNOSIS — E876 Hypokalemia: Secondary | ICD-10-CM | POA: Diagnosis not present

## 2020-03-23 DIAGNOSIS — E119 Type 2 diabetes mellitus without complications: Secondary | ICD-10-CM | POA: Diagnosis not present

## 2020-03-25 DIAGNOSIS — M1990 Unspecified osteoarthritis, unspecified site: Secondary | ICD-10-CM | POA: Diagnosis not present

## 2020-03-25 DIAGNOSIS — F0280 Dementia in other diseases classified elsewhere without behavioral disturbance: Secondary | ICD-10-CM | POA: Diagnosis not present

## 2020-03-25 DIAGNOSIS — F329 Major depressive disorder, single episode, unspecified: Secondary | ICD-10-CM | POA: Diagnosis not present

## 2020-03-25 DIAGNOSIS — E119 Type 2 diabetes mellitus without complications: Secondary | ICD-10-CM | POA: Diagnosis not present

## 2020-03-25 DIAGNOSIS — E876 Hypokalemia: Secondary | ICD-10-CM | POA: Diagnosis not present

## 2020-03-25 DIAGNOSIS — F419 Anxiety disorder, unspecified: Secondary | ICD-10-CM | POA: Diagnosis not present

## 2020-03-25 DIAGNOSIS — G309 Alzheimer's disease, unspecified: Secondary | ICD-10-CM | POA: Diagnosis not present

## 2020-03-25 DIAGNOSIS — S72011D Unspecified intracapsular fracture of right femur, subsequent encounter for closed fracture with routine healing: Secondary | ICD-10-CM | POA: Diagnosis not present

## 2020-03-25 DIAGNOSIS — I10 Essential (primary) hypertension: Secondary | ICD-10-CM | POA: Diagnosis not present

## 2020-03-27 DIAGNOSIS — R399 Unspecified symptoms and signs involving the genitourinary system: Secondary | ICD-10-CM | POA: Diagnosis not present

## 2020-03-27 DIAGNOSIS — E119 Type 2 diabetes mellitus without complications: Secondary | ICD-10-CM | POA: Diagnosis not present

## 2020-03-27 DIAGNOSIS — F419 Anxiety disorder, unspecified: Secondary | ICD-10-CM | POA: Diagnosis not present

## 2020-03-27 DIAGNOSIS — S72011D Unspecified intracapsular fracture of right femur, subsequent encounter for closed fracture with routine healing: Secondary | ICD-10-CM | POA: Diagnosis not present

## 2020-03-27 DIAGNOSIS — E876 Hypokalemia: Secondary | ICD-10-CM | POA: Diagnosis not present

## 2020-03-27 DIAGNOSIS — G309 Alzheimer's disease, unspecified: Secondary | ICD-10-CM | POA: Diagnosis not present

## 2020-03-27 DIAGNOSIS — F0280 Dementia in other diseases classified elsewhere without behavioral disturbance: Secondary | ICD-10-CM | POA: Diagnosis not present

## 2020-03-27 DIAGNOSIS — F329 Major depressive disorder, single episode, unspecified: Secondary | ICD-10-CM | POA: Diagnosis not present

## 2020-03-27 DIAGNOSIS — I10 Essential (primary) hypertension: Secondary | ICD-10-CM | POA: Diagnosis not present

## 2020-03-27 DIAGNOSIS — M1990 Unspecified osteoarthritis, unspecified site: Secondary | ICD-10-CM | POA: Diagnosis not present

## 2020-04-01 DIAGNOSIS — G309 Alzheimer's disease, unspecified: Secondary | ICD-10-CM | POA: Diagnosis not present

## 2020-04-01 DIAGNOSIS — M1990 Unspecified osteoarthritis, unspecified site: Secondary | ICD-10-CM | POA: Diagnosis not present

## 2020-04-01 DIAGNOSIS — E876 Hypokalemia: Secondary | ICD-10-CM | POA: Diagnosis not present

## 2020-04-01 DIAGNOSIS — F329 Major depressive disorder, single episode, unspecified: Secondary | ICD-10-CM | POA: Diagnosis not present

## 2020-04-01 DIAGNOSIS — E119 Type 2 diabetes mellitus without complications: Secondary | ICD-10-CM | POA: Diagnosis not present

## 2020-04-01 DIAGNOSIS — I10 Essential (primary) hypertension: Secondary | ICD-10-CM | POA: Diagnosis not present

## 2020-04-01 DIAGNOSIS — S72011D Unspecified intracapsular fracture of right femur, subsequent encounter for closed fracture with routine healing: Secondary | ICD-10-CM | POA: Diagnosis not present

## 2020-04-01 DIAGNOSIS — F0280 Dementia in other diseases classified elsewhere without behavioral disturbance: Secondary | ICD-10-CM | POA: Diagnosis not present

## 2020-04-01 DIAGNOSIS — F419 Anxiety disorder, unspecified: Secondary | ICD-10-CM | POA: Diagnosis not present

## 2020-04-03 DIAGNOSIS — F329 Major depressive disorder, single episode, unspecified: Secondary | ICD-10-CM | POA: Diagnosis not present

## 2020-04-03 DIAGNOSIS — S72011D Unspecified intracapsular fracture of right femur, subsequent encounter for closed fracture with routine healing: Secondary | ICD-10-CM | POA: Diagnosis not present

## 2020-04-03 DIAGNOSIS — G309 Alzheimer's disease, unspecified: Secondary | ICD-10-CM | POA: Diagnosis not present

## 2020-04-03 DIAGNOSIS — I10 Essential (primary) hypertension: Secondary | ICD-10-CM | POA: Diagnosis not present

## 2020-04-03 DIAGNOSIS — F0280 Dementia in other diseases classified elsewhere without behavioral disturbance: Secondary | ICD-10-CM | POA: Diagnosis not present

## 2020-04-03 DIAGNOSIS — E119 Type 2 diabetes mellitus without complications: Secondary | ICD-10-CM | POA: Diagnosis not present

## 2020-04-03 DIAGNOSIS — F419 Anxiety disorder, unspecified: Secondary | ICD-10-CM | POA: Diagnosis not present

## 2020-04-03 DIAGNOSIS — M1990 Unspecified osteoarthritis, unspecified site: Secondary | ICD-10-CM | POA: Diagnosis not present

## 2020-04-03 DIAGNOSIS — E876 Hypokalemia: Secondary | ICD-10-CM | POA: Diagnosis not present

## 2020-04-06 DIAGNOSIS — I10 Essential (primary) hypertension: Secondary | ICD-10-CM | POA: Diagnosis not present

## 2020-04-06 DIAGNOSIS — F329 Major depressive disorder, single episode, unspecified: Secondary | ICD-10-CM | POA: Diagnosis not present

## 2020-04-06 DIAGNOSIS — F0280 Dementia in other diseases classified elsewhere without behavioral disturbance: Secondary | ICD-10-CM | POA: Diagnosis not present

## 2020-04-06 DIAGNOSIS — M1990 Unspecified osteoarthritis, unspecified site: Secondary | ICD-10-CM | POA: Diagnosis not present

## 2020-04-06 DIAGNOSIS — G309 Alzheimer's disease, unspecified: Secondary | ICD-10-CM | POA: Diagnosis not present

## 2020-04-06 DIAGNOSIS — S72011D Unspecified intracapsular fracture of right femur, subsequent encounter for closed fracture with routine healing: Secondary | ICD-10-CM | POA: Diagnosis not present

## 2020-04-06 DIAGNOSIS — E119 Type 2 diabetes mellitus without complications: Secondary | ICD-10-CM | POA: Diagnosis not present

## 2020-04-06 DIAGNOSIS — E876 Hypokalemia: Secondary | ICD-10-CM | POA: Diagnosis not present

## 2020-04-06 DIAGNOSIS — F419 Anxiety disorder, unspecified: Secondary | ICD-10-CM | POA: Diagnosis not present

## 2020-04-07 DIAGNOSIS — M1990 Unspecified osteoarthritis, unspecified site: Secondary | ICD-10-CM | POA: Diagnosis not present

## 2020-04-07 DIAGNOSIS — F0280 Dementia in other diseases classified elsewhere without behavioral disturbance: Secondary | ICD-10-CM | POA: Diagnosis not present

## 2020-04-07 DIAGNOSIS — G309 Alzheimer's disease, unspecified: Secondary | ICD-10-CM | POA: Diagnosis not present

## 2020-04-07 DIAGNOSIS — E876 Hypokalemia: Secondary | ICD-10-CM | POA: Diagnosis not present

## 2020-04-07 DIAGNOSIS — F419 Anxiety disorder, unspecified: Secondary | ICD-10-CM | POA: Diagnosis not present

## 2020-04-07 DIAGNOSIS — F329 Major depressive disorder, single episode, unspecified: Secondary | ICD-10-CM | POA: Diagnosis not present

## 2020-04-07 DIAGNOSIS — I10 Essential (primary) hypertension: Secondary | ICD-10-CM | POA: Diagnosis not present

## 2020-04-07 DIAGNOSIS — S72011D Unspecified intracapsular fracture of right femur, subsequent encounter for closed fracture with routine healing: Secondary | ICD-10-CM | POA: Diagnosis not present

## 2020-04-07 DIAGNOSIS — E119 Type 2 diabetes mellitus without complications: Secondary | ICD-10-CM | POA: Diagnosis not present

## 2020-04-09 DIAGNOSIS — I10 Essential (primary) hypertension: Secondary | ICD-10-CM | POA: Diagnosis not present

## 2020-04-09 DIAGNOSIS — S72011D Unspecified intracapsular fracture of right femur, subsequent encounter for closed fracture with routine healing: Secondary | ICD-10-CM | POA: Diagnosis not present

## 2020-04-09 DIAGNOSIS — M1990 Unspecified osteoarthritis, unspecified site: Secondary | ICD-10-CM | POA: Diagnosis not present

## 2020-04-09 DIAGNOSIS — E119 Type 2 diabetes mellitus without complications: Secondary | ICD-10-CM | POA: Diagnosis not present

## 2020-04-09 DIAGNOSIS — F0280 Dementia in other diseases classified elsewhere without behavioral disturbance: Secondary | ICD-10-CM | POA: Diagnosis not present

## 2020-04-09 DIAGNOSIS — G309 Alzheimer's disease, unspecified: Secondary | ICD-10-CM | POA: Diagnosis not present

## 2020-04-09 DIAGNOSIS — F329 Major depressive disorder, single episode, unspecified: Secondary | ICD-10-CM | POA: Diagnosis not present

## 2020-04-09 DIAGNOSIS — E876 Hypokalemia: Secondary | ICD-10-CM | POA: Diagnosis not present

## 2020-04-09 DIAGNOSIS — F419 Anxiety disorder, unspecified: Secondary | ICD-10-CM | POA: Diagnosis not present

## 2020-04-10 DIAGNOSIS — F419 Anxiety disorder, unspecified: Secondary | ICD-10-CM | POA: Diagnosis not present

## 2020-04-10 DIAGNOSIS — S72011D Unspecified intracapsular fracture of right femur, subsequent encounter for closed fracture with routine healing: Secondary | ICD-10-CM | POA: Diagnosis not present

## 2020-04-10 DIAGNOSIS — F0280 Dementia in other diseases classified elsewhere without behavioral disturbance: Secondary | ICD-10-CM | POA: Diagnosis not present

## 2020-04-10 DIAGNOSIS — F329 Major depressive disorder, single episode, unspecified: Secondary | ICD-10-CM | POA: Diagnosis not present

## 2020-04-10 DIAGNOSIS — E876 Hypokalemia: Secondary | ICD-10-CM | POA: Diagnosis not present

## 2020-04-10 DIAGNOSIS — I10 Essential (primary) hypertension: Secondary | ICD-10-CM | POA: Diagnosis not present

## 2020-04-10 DIAGNOSIS — E119 Type 2 diabetes mellitus without complications: Secondary | ICD-10-CM | POA: Diagnosis not present

## 2020-04-10 DIAGNOSIS — G309 Alzheimer's disease, unspecified: Secondary | ICD-10-CM | POA: Diagnosis not present

## 2020-04-10 DIAGNOSIS — M1990 Unspecified osteoarthritis, unspecified site: Secondary | ICD-10-CM | POA: Diagnosis not present

## 2020-04-14 DIAGNOSIS — E876 Hypokalemia: Secondary | ICD-10-CM | POA: Diagnosis not present

## 2020-04-14 DIAGNOSIS — F0280 Dementia in other diseases classified elsewhere without behavioral disturbance: Secondary | ICD-10-CM | POA: Diagnosis not present

## 2020-04-14 DIAGNOSIS — E119 Type 2 diabetes mellitus without complications: Secondary | ICD-10-CM | POA: Diagnosis not present

## 2020-04-14 DIAGNOSIS — F419 Anxiety disorder, unspecified: Secondary | ICD-10-CM | POA: Diagnosis not present

## 2020-04-14 DIAGNOSIS — I10 Essential (primary) hypertension: Secondary | ICD-10-CM | POA: Diagnosis not present

## 2020-04-14 DIAGNOSIS — F329 Major depressive disorder, single episode, unspecified: Secondary | ICD-10-CM | POA: Diagnosis not present

## 2020-04-14 DIAGNOSIS — S72011D Unspecified intracapsular fracture of right femur, subsequent encounter for closed fracture with routine healing: Secondary | ICD-10-CM | POA: Diagnosis not present

## 2020-04-14 DIAGNOSIS — M1990 Unspecified osteoarthritis, unspecified site: Secondary | ICD-10-CM | POA: Diagnosis not present

## 2020-04-14 DIAGNOSIS — G309 Alzheimer's disease, unspecified: Secondary | ICD-10-CM | POA: Diagnosis not present

## 2020-04-17 DIAGNOSIS — F0280 Dementia in other diseases classified elsewhere without behavioral disturbance: Secondary | ICD-10-CM | POA: Diagnosis not present

## 2020-04-17 DIAGNOSIS — G309 Alzheimer's disease, unspecified: Secondary | ICD-10-CM | POA: Diagnosis not present

## 2020-04-17 DIAGNOSIS — I10 Essential (primary) hypertension: Secondary | ICD-10-CM | POA: Diagnosis not present

## 2020-04-17 DIAGNOSIS — S72011D Unspecified intracapsular fracture of right femur, subsequent encounter for closed fracture with routine healing: Secondary | ICD-10-CM | POA: Diagnosis not present

## 2020-04-17 DIAGNOSIS — E876 Hypokalemia: Secondary | ICD-10-CM | POA: Diagnosis not present

## 2020-04-17 DIAGNOSIS — Z96641 Presence of right artificial hip joint: Secondary | ICD-10-CM | POA: Diagnosis not present

## 2020-04-17 DIAGNOSIS — E119 Type 2 diabetes mellitus without complications: Secondary | ICD-10-CM | POA: Diagnosis not present

## 2020-04-17 DIAGNOSIS — F329 Major depressive disorder, single episode, unspecified: Secondary | ICD-10-CM | POA: Diagnosis not present

## 2020-04-17 DIAGNOSIS — M1990 Unspecified osteoarthritis, unspecified site: Secondary | ICD-10-CM | POA: Diagnosis not present

## 2020-04-17 DIAGNOSIS — F419 Anxiety disorder, unspecified: Secondary | ICD-10-CM | POA: Diagnosis not present

## 2020-04-17 DIAGNOSIS — Z09 Encounter for follow-up examination after completed treatment for conditions other than malignant neoplasm: Secondary | ICD-10-CM | POA: Diagnosis not present

## 2020-04-21 DIAGNOSIS — M1990 Unspecified osteoarthritis, unspecified site: Secondary | ICD-10-CM | POA: Diagnosis not present

## 2020-04-21 DIAGNOSIS — E119 Type 2 diabetes mellitus without complications: Secondary | ICD-10-CM | POA: Diagnosis not present

## 2020-04-21 DIAGNOSIS — F419 Anxiety disorder, unspecified: Secondary | ICD-10-CM | POA: Diagnosis not present

## 2020-04-21 DIAGNOSIS — F329 Major depressive disorder, single episode, unspecified: Secondary | ICD-10-CM | POA: Diagnosis not present

## 2020-04-21 DIAGNOSIS — I10 Essential (primary) hypertension: Secondary | ICD-10-CM | POA: Diagnosis not present

## 2020-04-21 DIAGNOSIS — S72011D Unspecified intracapsular fracture of right femur, subsequent encounter for closed fracture with routine healing: Secondary | ICD-10-CM | POA: Diagnosis not present

## 2020-04-21 DIAGNOSIS — E876 Hypokalemia: Secondary | ICD-10-CM | POA: Diagnosis not present

## 2020-04-21 DIAGNOSIS — G309 Alzheimer's disease, unspecified: Secondary | ICD-10-CM | POA: Diagnosis not present

## 2020-04-21 DIAGNOSIS — F0280 Dementia in other diseases classified elsewhere without behavioral disturbance: Secondary | ICD-10-CM | POA: Diagnosis not present

## 2020-05-07 DIAGNOSIS — G309 Alzheimer's disease, unspecified: Secondary | ICD-10-CM | POA: Diagnosis not present

## 2020-05-07 DIAGNOSIS — I10 Essential (primary) hypertension: Secondary | ICD-10-CM | POA: Diagnosis not present

## 2020-05-07 DIAGNOSIS — E876 Hypokalemia: Secondary | ICD-10-CM | POA: Diagnosis not present

## 2020-05-07 DIAGNOSIS — F0280 Dementia in other diseases classified elsewhere without behavioral disturbance: Secondary | ICD-10-CM | POA: Diagnosis not present

## 2020-05-07 DIAGNOSIS — F419 Anxiety disorder, unspecified: Secondary | ICD-10-CM | POA: Diagnosis not present

## 2020-05-07 DIAGNOSIS — E119 Type 2 diabetes mellitus without complications: Secondary | ICD-10-CM | POA: Diagnosis not present

## 2020-05-07 DIAGNOSIS — S72011D Unspecified intracapsular fracture of right femur, subsequent encounter for closed fracture with routine healing: Secondary | ICD-10-CM | POA: Diagnosis not present

## 2020-05-07 DIAGNOSIS — F329 Major depressive disorder, single episode, unspecified: Secondary | ICD-10-CM | POA: Diagnosis not present

## 2020-05-07 DIAGNOSIS — M1990 Unspecified osteoarthritis, unspecified site: Secondary | ICD-10-CM | POA: Diagnosis not present

## 2020-05-08 DIAGNOSIS — E119 Type 2 diabetes mellitus without complications: Secondary | ICD-10-CM | POA: Diagnosis not present

## 2020-05-08 DIAGNOSIS — E876 Hypokalemia: Secondary | ICD-10-CM | POA: Diagnosis not present

## 2020-05-08 DIAGNOSIS — G309 Alzheimer's disease, unspecified: Secondary | ICD-10-CM | POA: Diagnosis not present

## 2020-05-08 DIAGNOSIS — F0280 Dementia in other diseases classified elsewhere without behavioral disturbance: Secondary | ICD-10-CM | POA: Diagnosis not present

## 2020-05-08 DIAGNOSIS — I10 Essential (primary) hypertension: Secondary | ICD-10-CM | POA: Diagnosis not present

## 2020-05-08 DIAGNOSIS — F329 Major depressive disorder, single episode, unspecified: Secondary | ICD-10-CM | POA: Diagnosis not present

## 2020-05-08 DIAGNOSIS — F419 Anxiety disorder, unspecified: Secondary | ICD-10-CM | POA: Diagnosis not present

## 2020-05-08 DIAGNOSIS — S72011D Unspecified intracapsular fracture of right femur, subsequent encounter for closed fracture with routine healing: Secondary | ICD-10-CM | POA: Diagnosis not present

## 2020-05-08 DIAGNOSIS — M1990 Unspecified osteoarthritis, unspecified site: Secondary | ICD-10-CM | POA: Diagnosis not present

## 2020-05-15 DIAGNOSIS — G309 Alzheimer's disease, unspecified: Secondary | ICD-10-CM | POA: Diagnosis not present

## 2020-05-15 DIAGNOSIS — E119 Type 2 diabetes mellitus without complications: Secondary | ICD-10-CM | POA: Diagnosis not present

## 2020-05-15 DIAGNOSIS — I10 Essential (primary) hypertension: Secondary | ICD-10-CM | POA: Diagnosis not present

## 2020-05-15 DIAGNOSIS — S72011D Unspecified intracapsular fracture of right femur, subsequent encounter for closed fracture with routine healing: Secondary | ICD-10-CM | POA: Diagnosis not present

## 2020-05-15 DIAGNOSIS — E876 Hypokalemia: Secondary | ICD-10-CM | POA: Diagnosis not present

## 2020-05-15 DIAGNOSIS — F419 Anxiety disorder, unspecified: Secondary | ICD-10-CM | POA: Diagnosis not present

## 2020-05-15 DIAGNOSIS — M1990 Unspecified osteoarthritis, unspecified site: Secondary | ICD-10-CM | POA: Diagnosis not present

## 2020-05-15 DIAGNOSIS — F0280 Dementia in other diseases classified elsewhere without behavioral disturbance: Secondary | ICD-10-CM | POA: Diagnosis not present

## 2020-05-15 DIAGNOSIS — F329 Major depressive disorder, single episode, unspecified: Secondary | ICD-10-CM | POA: Diagnosis not present

## 2020-05-20 DIAGNOSIS — F0280 Dementia in other diseases classified elsewhere without behavioral disturbance: Secondary | ICD-10-CM | POA: Diagnosis not present

## 2020-05-20 DIAGNOSIS — E876 Hypokalemia: Secondary | ICD-10-CM | POA: Diagnosis not present

## 2020-05-20 DIAGNOSIS — S72011D Unspecified intracapsular fracture of right femur, subsequent encounter for closed fracture with routine healing: Secondary | ICD-10-CM | POA: Diagnosis not present

## 2020-05-20 DIAGNOSIS — E119 Type 2 diabetes mellitus without complications: Secondary | ICD-10-CM | POA: Diagnosis not present

## 2020-05-20 DIAGNOSIS — M1990 Unspecified osteoarthritis, unspecified site: Secondary | ICD-10-CM | POA: Diagnosis not present

## 2020-05-20 DIAGNOSIS — F419 Anxiety disorder, unspecified: Secondary | ICD-10-CM | POA: Diagnosis not present

## 2020-05-20 DIAGNOSIS — F329 Major depressive disorder, single episode, unspecified: Secondary | ICD-10-CM | POA: Diagnosis not present

## 2020-05-20 DIAGNOSIS — G309 Alzheimer's disease, unspecified: Secondary | ICD-10-CM | POA: Diagnosis not present

## 2020-05-20 DIAGNOSIS — I10 Essential (primary) hypertension: Secondary | ICD-10-CM | POA: Diagnosis not present

## 2020-05-26 DIAGNOSIS — I10 Essential (primary) hypertension: Secondary | ICD-10-CM | POA: Diagnosis not present

## 2020-05-26 DIAGNOSIS — E119 Type 2 diabetes mellitus without complications: Secondary | ICD-10-CM | POA: Diagnosis not present

## 2020-05-26 DIAGNOSIS — F329 Major depressive disorder, single episode, unspecified: Secondary | ICD-10-CM | POA: Diagnosis not present

## 2020-05-26 DIAGNOSIS — F419 Anxiety disorder, unspecified: Secondary | ICD-10-CM | POA: Diagnosis not present

## 2020-05-26 DIAGNOSIS — G309 Alzheimer's disease, unspecified: Secondary | ICD-10-CM | POA: Diagnosis not present

## 2020-05-26 DIAGNOSIS — F0280 Dementia in other diseases classified elsewhere without behavioral disturbance: Secondary | ICD-10-CM | POA: Diagnosis not present

## 2020-05-26 DIAGNOSIS — M1990 Unspecified osteoarthritis, unspecified site: Secondary | ICD-10-CM | POA: Diagnosis not present

## 2020-05-26 DIAGNOSIS — E876 Hypokalemia: Secondary | ICD-10-CM | POA: Diagnosis not present

## 2020-05-26 DIAGNOSIS — S72011D Unspecified intracapsular fracture of right femur, subsequent encounter for closed fracture with routine healing: Secondary | ICD-10-CM | POA: Diagnosis not present

## 2020-06-01 DIAGNOSIS — F329 Major depressive disorder, single episode, unspecified: Secondary | ICD-10-CM | POA: Diagnosis not present

## 2020-06-01 DIAGNOSIS — G309 Alzheimer's disease, unspecified: Secondary | ICD-10-CM | POA: Diagnosis not present

## 2020-06-01 DIAGNOSIS — F0280 Dementia in other diseases classified elsewhere without behavioral disturbance: Secondary | ICD-10-CM | POA: Diagnosis not present

## 2020-06-01 DIAGNOSIS — I10 Essential (primary) hypertension: Secondary | ICD-10-CM | POA: Diagnosis not present

## 2020-06-01 DIAGNOSIS — E119 Type 2 diabetes mellitus without complications: Secondary | ICD-10-CM | POA: Diagnosis not present

## 2020-06-01 DIAGNOSIS — E876 Hypokalemia: Secondary | ICD-10-CM | POA: Diagnosis not present

## 2020-06-01 DIAGNOSIS — F419 Anxiety disorder, unspecified: Secondary | ICD-10-CM | POA: Diagnosis not present

## 2020-06-01 DIAGNOSIS — S72011D Unspecified intracapsular fracture of right femur, subsequent encounter for closed fracture with routine healing: Secondary | ICD-10-CM | POA: Diagnosis not present

## 2020-06-01 DIAGNOSIS — M1990 Unspecified osteoarthritis, unspecified site: Secondary | ICD-10-CM | POA: Diagnosis not present

## 2020-06-10 DIAGNOSIS — Z96641 Presence of right artificial hip joint: Secondary | ICD-10-CM | POA: Diagnosis not present

## 2020-08-04 DIAGNOSIS — M25551 Pain in right hip: Secondary | ICD-10-CM | POA: Diagnosis not present

## 2020-08-04 DIAGNOSIS — M25552 Pain in left hip: Secondary | ICD-10-CM | POA: Diagnosis not present

## 2020-10-09 ENCOUNTER — Emergency Department: Payer: Medicare HMO

## 2020-10-09 ENCOUNTER — Encounter: Payer: Self-pay | Admitting: *Deleted

## 2020-10-09 ENCOUNTER — Other Ambulatory Visit: Payer: Self-pay

## 2020-10-09 ENCOUNTER — Inpatient Hospital Stay
Admission: EM | Admit: 2020-10-09 | Discharge: 2020-10-11 | DRG: 071 | Disposition: A | Discharge status: Home / Self Care | Payer: Medicare HMO | Attending: Internal Medicine | Admitting: Internal Medicine

## 2020-10-09 DIAGNOSIS — Z66 Do not resuscitate: Secondary | ICD-10-CM | POA: Diagnosis not present

## 2020-10-09 DIAGNOSIS — Z20822 Contact with and (suspected) exposure to covid-19: Secondary | ICD-10-CM | POA: Diagnosis not present

## 2020-10-09 DIAGNOSIS — Z7984 Long term (current) use of oral hypoglycemic drugs: Secondary | ICD-10-CM | POA: Diagnosis not present

## 2020-10-09 DIAGNOSIS — E1165 Type 2 diabetes mellitus with hyperglycemia: Secondary | ICD-10-CM | POA: Diagnosis not present

## 2020-10-09 DIAGNOSIS — G9341 Metabolic encephalopathy: Secondary | ICD-10-CM | POA: Diagnosis not present

## 2020-10-09 DIAGNOSIS — Z96641 Presence of right artificial hip joint: Secondary | ICD-10-CM | POA: Diagnosis present

## 2020-10-09 DIAGNOSIS — A419 Sepsis, unspecified organism: Secondary | ICD-10-CM | POA: Diagnosis not present

## 2020-10-09 DIAGNOSIS — I10 Essential (primary) hypertension: Secondary | ICD-10-CM | POA: Diagnosis present

## 2020-10-09 DIAGNOSIS — E870 Hyperosmolality and hypernatremia: Secondary | ICD-10-CM | POA: Diagnosis not present

## 2020-10-09 DIAGNOSIS — Z79899 Other long term (current) drug therapy: Secondary | ICD-10-CM

## 2020-10-09 DIAGNOSIS — R059 Cough, unspecified: Secondary | ICD-10-CM

## 2020-10-09 DIAGNOSIS — R509 Fever, unspecified: Secondary | ICD-10-CM

## 2020-10-09 DIAGNOSIS — R4182 Altered mental status, unspecified: Secondary | ICD-10-CM | POA: Diagnosis not present

## 2020-10-09 DIAGNOSIS — F0390 Unspecified dementia without behavioral disturbance: Secondary | ICD-10-CM | POA: Diagnosis present

## 2020-10-09 DIAGNOSIS — E876 Hypokalemia: Secondary | ICD-10-CM | POA: Diagnosis not present

## 2020-10-09 DIAGNOSIS — E860 Dehydration: Secondary | ICD-10-CM | POA: Diagnosis not present

## 2020-10-09 DIAGNOSIS — E890 Postprocedural hypothyroidism: Secondary | ICD-10-CM | POA: Diagnosis present

## 2020-10-09 LAB — CBC WITH DIFFERENTIAL/PLATELET
Abs Immature Granulocytes: 0.02 10*3/uL (ref 0.00–0.07)
Basophils Absolute: 0.1 10*3/uL (ref 0.0–0.1)
Basophils Relative: 1 %
Eosinophils Absolute: 0.1 10*3/uL (ref 0.0–0.5)
Eosinophils Relative: 1 %
HCT: 40.5 % (ref 36.0–46.0)
Hemoglobin: 12.8 g/dL (ref 12.0–15.0)
Immature Granulocytes: 0 %
Lymphocytes Relative: 24 %
Lymphs Abs: 1.7 10*3/uL (ref 0.7–4.0)
MCH: 28.3 pg (ref 26.0–34.0)
MCHC: 31.6 g/dL (ref 30.0–36.0)
MCV: 89.4 fL (ref 80.0–100.0)
Monocytes Absolute: 0.4 10*3/uL (ref 0.1–1.0)
Monocytes Relative: 6 %
Neutro Abs: 4.8 10*3/uL (ref 1.7–7.7)
Neutrophils Relative %: 68 %
Platelets: 227 10*3/uL (ref 150–400)
RBC: 4.53 MIL/uL (ref 3.87–5.11)
RDW: 14.4 % (ref 11.5–15.5)
WBC: 7 10*3/uL (ref 4.0–10.5)
nRBC: 0 % (ref 0.0–0.2)

## 2020-10-09 LAB — COMPREHENSIVE METABOLIC PANEL
ALT: 14 U/L (ref 0–44)
AST: 21 U/L (ref 15–41)
Albumin: 4.2 g/dL (ref 3.5–5.0)
Alkaline Phosphatase: 86 U/L (ref 38–126)
Anion gap: 14 (ref 5–15)
BUN: 16 mg/dL (ref 8–23)
CO2: 28 mmol/L (ref 22–32)
Calcium: 9.4 mg/dL (ref 8.9–10.3)
Chloride: 107 mmol/L (ref 98–111)
Creatinine, Ser: 0.69 mg/dL (ref 0.44–1.00)
GFR, Estimated: >60 mL/min (ref >60)
Glucose, Bld: 418 mg/dL — ABNORMAL HIGH (ref 70–99)
Potassium: 3.3 mmol/L — ABNORMAL LOW (ref 3.5–5.1)
Sodium: 149 mmol/L — ABNORMAL HIGH (ref 135–145)
Total Bilirubin: 1.3 mg/dL — ABNORMAL HIGH (ref 0.3–1.2)
Total Protein: 8 g/dL (ref 6.5–8.1)

## 2020-10-09 LAB — URINALYSIS, COMPLETE (UACMP) WITH MICROSCOPIC
Bacteria, UA: NONE SEEN
Bilirubin Urine: NEGATIVE
Glucose, UA: >=500 mg/dL — AB
Hgb urine dipstick: NEGATIVE
Ketones, ur: 5 mg/dL — AB
Leukocytes,Ua: NEGATIVE
Nitrite: NEGATIVE
Protein, ur: NEGATIVE mg/dL
Specific Gravity, Urine: 1.03 (ref 1.005–1.030)
pH: 7 (ref 5.0–8.0)

## 2020-10-09 LAB — RESP PANEL BY RT-PCR (FLU A&B, COVID) ARPGX2
Influenza A by PCR: NEGATIVE
Influenza B by PCR: NEGATIVE
SARS Coronavirus 2 by RT PCR: NEGATIVE

## 2020-10-09 LAB — LACTIC ACID, PLASMA
Lactic Acid, Venous: 2.4 mmol/L (ref 0.5–1.9)
Lactic Acid, Venous: 1.5 mmol/L (ref 0.5–1.9)

## 2020-10-09 NOTE — ED Provider Notes (Signed)
[facility] Emergency Department Provider Note   ____________________________________________   Event Date/Time   First MD Initiated Contact with Patient 10/09/20 2104     (approximate)  I have reviewed the triage vital signs and the nursing notes.   HISTORY  Chief Complaint Altered Mental Status    HPI Melanie Turner is a 86 y.o. female with a stated past medical history of type 2 diabetes and hypertension who presents with her daughter stating that patient is confused and not acting herself.  Daughter notes that when she got to patient's house today she was "chewing food but not swallowing it".  Daughter states the patient has been confused all day and is concerned that she may have a urinary tract infection as this is happened in the past.  Also notes the patient has been "shaking a lot".  Patient cannot participate actively in history or review of systems         Past Medical History:  Diagnosis Date  . Diabetes mellitus without complication (HCC)   . Hypertension     Patient Active Problem List   Diagnosis Date Noted  . UTI (urinary tract infection) 01/28/2020  . Dementia (HCC) 01/28/2020  . Hypokalemia 01/28/2020  . Fall at home, initial encounter 01/28/2020  . Closed right hip fracture (HCC) 01/28/2020  . Type II diabetes mellitus with renal manifestations (HCC) 03/14/2019  . Benign essential HTN 03/14/2019  . Seizure (HCC) 03/08/2019    Past Surgical History:  Procedure Laterality Date  . HIP ARTHROPLASTY Right 01/29/2020   Procedure: ARTHROPLASTY BIPOLAR HIP (HEMIARTHROPLASTY);  Surgeon: Juanell Fairly, MD;  Location: [facility] ORS;  Service: Orthopedics;  Laterality: Right;  . THYROIDECTOMY      Prior to Admission medications   Medication Sig Start Date End Date Taking? Authorizing Provider  amLODipine (NORVASC) 5 MG tablet Take 1 tablet (5 mg total) by mouth daily. 02/05/20   Pieter Partridge, MD  clotrimazole-betamethasone  (LOTRISONE) cream Apply 1 application topically 2 (two) times daily.    [provider]  donepezil (ARICEPT) 5 MG tablet Take 5 mg by mouth daily.  06/01/18   [provider]  enoxaparin (LOVENOX) 40 MG/0.4ML injection Inject 40 mg into the skin daily.    [provider]  Infant Care Products [facility] EX) Apply 1 application topically 2 (two) times daily. To sacrum and buttocks for nonspecific rash.    [provider]  metFORMIN (GLUCOPHAGE) 500 MG tablet Take 1 tablet (500 mg total) by mouth 2 (two) times daily with a meal for 30 days. 03/14/19 02/06/20  Amin, Loura Halt, MD  mirtazapine (REMERON) 7.5 MG tablet Take 7.5 mg by mouth daily.  08/31/18   [provider]    Allergies Patient has no known allergies.  Family History  Problem Relation Age of Onset  . Cancer Brother        not know which type of cancer    Social History Social History   Tobacco Use  . Smoking status: Never Smoker  . Smokeless tobacco: Never Used  Vaping Use  . Vaping Use: Never used  Substance Use Topics  . Alcohol use: Yes  . Drug use: No    Review of Systems Unable to assess  ____________________________________________   PHYSICAL EXAM:  VITAL SIGNS: ED Triage Vitals [10/09/20 2017]  Enc Vitals Group     BP (!) 154/100     Pulse Rate (!) 110     Resp 18     Temp  100.2 F (37.9 C)     Temp Source Oral     SpO2 97 %     Weight      Height      Head Circumference      [facility] Flow      Pain Score      Pain Loc      Pain Edu?      Excl. in GC?    Constitutional: Alert and oriented only to self. Well appearing and in no acute distress. Eyes: Conjunctivae are normal. PERRL. Head: Atraumatic. Nose: No congestion/rhinnorhea. Mouth/Throat: Mucous membranes are moist. Neck: No stridor Cardiovascular: Grossly normal heart sounds.  Good peripheral circulation. Respiratory: Normal respiratory effort.  No retractions. Gastrointestinal: Soft and  nontender. No distention. Musculoskeletal: No obvious deformities Neurologic:  Normal speech and language. No gross focal neurologic deficits are appreciated. Skin:  Skin is warm and dry. No rash noted. Psychiatric: Cooperative.  Speech is normal.  ____________________________________________   LABS (all labs ordered are listed, but only abnormal results are displayed)  Labs Reviewed  LACTIC ACID, PLASMA - Abnormal; Notable for the following components:      Result Value   Lactic Acid, Venous 2.4 (*)    All other components within normal limits  COMPREHENSIVE METABOLIC PANEL - Abnormal; Notable for the following components:   Sodium 149 (*)    Potassium 3.3 (*)    Glucose, Bld 418 (*)    Total Bilirubin 1.3 (*)    All other components within normal limits  URINALYSIS, COMPLETE (UACMP) WITH MICROSCOPIC - Abnormal; Notable for the following components:   Color, Urine STRAW (*)    APPearance CLEAR (*)    Glucose, UA >=500 (*)    Ketones, ur 5 (*)    All other components within normal limits  RESP PANEL BY RT-PCR (FLU A&B, COVID) ARPGX2  LACTIC ACID, PLASMA  CBC WITH DIFFERENTIAL/PLATELET  BLOOD GAS, VENOUS   RADIOLOGY  ED MD interpretation: One-view portable x-ray of the chest shows no evidence of acute abnormalities including no pneumonia, pneumothorax, or widened mediastinum  Official radiology report(s): DG Chest Port 1 View  Result Date: 10/09/2020 CLINICAL DATA:  Cough EXAM: PORTABLE CHEST 1 VIEW COMPARISON:  01/28/2020 FINDINGS: Heart is normal size. Aorta normal caliber. Aortic calcifications. No confluent opacities or effusions. No acute bony abnormality. Degenerative changes in the shoulders. IMPRESSION: No active disease. Electronically Signed   By: Charlett Nose M.D.   On: 10/09/2020 21:13    ____________________________________________   PROCEDURES  Procedure(s) performed (including Critical  Care):  Procedures   ____________________________________________   INITIAL IMPRESSION / ASSESSMENT AND PLAN / ED COURSE  As part of my medical decision making, I reviewed the following data within the electronic MEDICAL RECORD NUMBER Nursing notes reviewed and incorporated, Labs reviewed, EKG interpreted, Old chart reviewed, Radiograph reviewed and Notes from prior ED visits reviewed and incorporated        Patient is an 86 year old female the presents for altered mental status.  Patient is pending laboratory and radiologic evaluation at the end of my shift.  Care of this patient will be signed out to the oncoming physician at the end of my shift.  All pertinent patient information conveyed and all questions answered.  All further care and disposition decisions will be made by the oncoming physician.      ____________________________________________   FINAL CLINICAL IMPRESSION(S) / ED DIAGNOSES  Final diagnoses:  Cough     ED Discharge Orders    None  Note:  This document was prepared using Dragon voice recognition software and may include unintentional dictation errors.   Merwyn Katos, MD 10/10/20 [phone]

## 2020-10-09 NOTE — ED Notes (Signed)
ED Provider at bedside. 

## 2020-10-09 NOTE — ED Notes (Signed)
Patient noted to have brief on that was full of urine and clothing damp with urine, clothing removed, placed in hospital gown and clean brief.

## 2020-10-09 NOTE — ED Triage Notes (Signed)
Pt arrives with her son who states when he got home from work around 0730 this morning, she was sitting up eating and was not swallowing her food ( she was coughing and holding food in her mouth). He says she has been confused today, she gets like this when she has a UTI. She has been shaking a lot. Fidgety in triage, oriented to person, mae x 4

## 2020-10-10 ENCOUNTER — Inpatient Hospital Stay: Payer: Medicare HMO

## 2020-10-10 DIAGNOSIS — Z7984 Long term (current) use of oral hypoglycemic drugs: Secondary | ICD-10-CM | POA: Diagnosis not present

## 2020-10-10 DIAGNOSIS — E1165 Type 2 diabetes mellitus with hyperglycemia: Secondary | ICD-10-CM

## 2020-10-10 DIAGNOSIS — Z96641 Presence of right artificial hip joint: Secondary | ICD-10-CM | POA: Diagnosis present

## 2020-10-10 DIAGNOSIS — E870 Hyperosmolality and hypernatremia: Secondary | ICD-10-CM | POA: Diagnosis present

## 2020-10-10 DIAGNOSIS — R509 Fever, unspecified: Secondary | ICD-10-CM | POA: Diagnosis not present

## 2020-10-10 DIAGNOSIS — R4182 Altered mental status, unspecified: Secondary | ICD-10-CM | POA: Diagnosis not present

## 2020-10-10 DIAGNOSIS — Z20822 Contact with and (suspected) exposure to covid-19: Secondary | ICD-10-CM | POA: Diagnosis present

## 2020-10-10 DIAGNOSIS — G9341 Metabolic encephalopathy: Principal | ICD-10-CM

## 2020-10-10 DIAGNOSIS — Z79899 Other long term (current) drug therapy: Secondary | ICD-10-CM | POA: Diagnosis not present

## 2020-10-10 DIAGNOSIS — E876 Hypokalemia: Secondary | ICD-10-CM | POA: Diagnosis present

## 2020-10-10 DIAGNOSIS — I10 Essential (primary) hypertension: Secondary | ICD-10-CM | POA: Diagnosis present

## 2020-10-10 DIAGNOSIS — Z66 Do not resuscitate: Secondary | ICD-10-CM | POA: Diagnosis present

## 2020-10-10 DIAGNOSIS — A419 Sepsis, unspecified organism: Secondary | ICD-10-CM

## 2020-10-10 DIAGNOSIS — R059 Cough, unspecified: Secondary | ICD-10-CM | POA: Diagnosis present

## 2020-10-10 DIAGNOSIS — E860 Dehydration: Secondary | ICD-10-CM | POA: Diagnosis present

## 2020-10-10 DIAGNOSIS — F0390 Unspecified dementia without behavioral disturbance: Secondary | ICD-10-CM | POA: Diagnosis present

## 2020-10-10 DIAGNOSIS — E890 Postprocedural hypothyroidism: Secondary | ICD-10-CM | POA: Diagnosis present

## 2020-10-10 LAB — BASIC METABOLIC PANEL
Anion gap: 13 (ref 5–15)
BUN: 10 mg/dL (ref 8–23)
CO2: 25 mmol/L (ref 22–32)
Calcium: 8.6 mg/dL — ABNORMAL LOW (ref 8.9–10.3)
Chloride: 106 mmol/L (ref 98–111)
Creatinine, Ser: 0.55 mg/dL (ref 0.44–1.00)
GFR, Estimated: >60 mL/min (ref >60)
Glucose, Bld: 219 mg/dL — ABNORMAL HIGH (ref 70–99)
Potassium: 3 mmol/L — ABNORMAL LOW (ref 3.5–5.1)
Sodium: 144 mmol/L (ref 135–145)

## 2020-10-10 LAB — CBC
HCT: 38.2 % (ref 36.0–46.0)
Hemoglobin: 12.3 g/dL (ref 12.0–15.0)
MCH: 28.2 pg (ref 26.0–34.0)
MCHC: 32.2 g/dL (ref 30.0–36.0)
MCV: 87.6 fL (ref 80.0–100.0)
Platelets: 205 10*3/uL (ref 150–400)
RBC: 4.36 MIL/uL (ref 3.87–5.11)
RDW: 14.1 % (ref 11.5–15.5)
WBC: 6.9 10*3/uL (ref 4.0–10.5)
nRBC: 0 % (ref 0.0–0.2)

## 2020-10-10 LAB — BLOOD GAS, VENOUS
Acid-Base Excess: 3.3 mmol/L — ABNORMAL HIGH (ref 0.0–2.0)
Bicarbonate: 29.1 mmol/L — ABNORMAL HIGH (ref 20.0–28.0)
O2 Saturation: 87.9 %
Patient temperature: 37
pCO2, Ven: 48 mmHg (ref 44.0–60.0)
pH, Ven: 7.39 (ref 7.250–7.430)
pO2, Ven: 55 mmHg — ABNORMAL HIGH (ref 32.0–45.0)

## 2020-10-10 LAB — GLUCOSE, CAPILLARY
Glucose-Capillary: 234 mg/dL — ABNORMAL HIGH (ref 70–99)
Glucose-Capillary: 320 mg/dL — ABNORMAL HIGH (ref 70–99)
Glucose-Capillary: 60 mg/dL — ABNORMAL LOW (ref 70–99)
Glucose-Capillary: 76 mg/dL (ref 70–99)
Glucose-Capillary: 230 mg/dL — ABNORMAL HIGH (ref 70–99)

## 2020-10-10 LAB — PHOSPHORUS: Phosphorus: 2.3 mg/dL — ABNORMAL LOW (ref 2.5–4.6)

## 2020-10-10 LAB — HEMOGLOBIN A1C
Hgb A1c MFr Bld: 10.1 % — ABNORMAL HIGH (ref 4.8–5.6)
Mean Plasma Glucose: 243.17 mg/dL

## 2020-10-10 LAB — PROCALCITONIN: Procalcitonin: <0.1 ng/mL

## 2020-10-10 LAB — PROTIME-INR
INR: 1.1 (ref 0.8–1.2)
Prothrombin Time: 13.4 seconds (ref 11.4–15.2)

## 2020-10-10 LAB — CORTISOL-AM, BLOOD: Cortisol - AM: 37.3 ug/dL — ABNORMAL HIGH (ref 6.7–22.6)

## 2020-10-10 LAB — MAGNESIUM: Magnesium: 1.4 mg/dL — ABNORMAL LOW (ref 1.7–2.4)

## 2020-10-10 LAB — CBG MONITORING, ED: Glucose-Capillary: 316 mg/dL — ABNORMAL HIGH (ref 70–99)

## 2020-10-10 MED ORDER — ONDANSETRON HCL 4 MG/2ML IJ SOLN: 4.0000 mg | Freq: Four times a day (QID) | INTRAMUSCULAR | Status: DC | PRN

## 2020-10-10 MED ORDER — SODIUM CHLORIDE 0.9 % IV BOLUS (SEPSIS)
500.0000 mL | Freq: Once | INTRAVENOUS | Status: AC
  Administered 2020-10-10: 02:00:00 500 mL via INTRAVENOUS

## 2020-10-10 MED ORDER — SODIUM CHLORIDE 0.9 % IV SOLN
2.0000 g | Freq: Once | INTRAVENOUS | Status: AC
  Administered 2020-10-10: 01:00:00 2 g via INTRAVENOUS
  Filled 2020-10-10: qty 2

## 2020-10-10 MED ORDER — LABETALOL HCL 5 MG/ML IV SOLN
INTRAVENOUS | Status: AC
  Administered 2020-10-10: 01:00:00 10 mg via INTRAVENOUS
  Filled 2020-10-10: qty 4

## 2020-10-10 MED ORDER — ACETAMINOPHEN 650 MG RE SUPP: 650.0000 mg | Freq: Four times a day (QID) | RECTAL | Status: DC | PRN

## 2020-10-10 MED ORDER — SODIUM CHLORIDE 0.9 % IV BOLUS (SEPSIS)
250.0000 mL | Freq: Once | INTRAVENOUS | Status: AC
  Administered 2020-10-10: 03:00:00 250 mL via INTRAVENOUS

## 2020-10-10 MED ORDER — ENOXAPARIN SODIUM 40 MG/0.4ML ~~LOC~~ SOLN
40.0000 mg | SUBCUTANEOUS | Status: DC
  Administered 2020-10-10 – 2020-10-11 (×2): 40 mg via SUBCUTANEOUS
  Filled 2020-10-10 (×2): qty 0.4

## 2020-10-10 MED ORDER — LABETALOL HCL 5 MG/ML IV SOLN: 10.0000 mg | Freq: Once | INTRAVENOUS | Status: AC

## 2020-10-10 MED ORDER — ORAL CARE MOUTH RINSE
15.0000 mL | Freq: Two times a day (BID) | OROMUCOSAL | Status: DC
  Administered 2020-10-11 (×2): 15 mL via OROMUCOSAL

## 2020-10-10 MED ORDER — POTASSIUM CHLORIDE 20 MEQ PO PACK
40.0000 meq | PACK | Freq: Two times a day (BID) | ORAL | Status: AC
  Administered 2020-10-10 (×2): 40 meq via ORAL
  Filled 2020-10-10: qty 2

## 2020-10-10 MED ORDER — SODIUM CHLORIDE 0.9 % IV BOLUS (SEPSIS)
1000.0000 mL | Freq: Once | INTRAVENOUS | Status: AC
  Administered 2020-10-10: 1000 mL via INTRAVENOUS

## 2020-10-10 MED ORDER — SODIUM CHLORIDE 0.9 % IV SOLN: INTRAVENOUS | Status: DC

## 2020-10-10 MED ORDER — LACTATED RINGERS IV SOLN: INTRAVENOUS | Status: DC

## 2020-10-10 MED ORDER — INSULIN ASPART 100 UNIT/ML ~~LOC~~ SOLN
0.0000 [IU] | Freq: Every day | SUBCUTANEOUS | Status: DC
  Administered 2020-10-10: 23:00:00 2 [IU] via SUBCUTANEOUS
  Administered 2020-10-10: 01:00:00 4 [IU] via SUBCUTANEOUS
  Filled 2020-10-10: qty 1

## 2020-10-10 MED ORDER — VANCOMYCIN HCL IN DEXTROSE 1-5 GM/200ML-% IV SOLN
1000.0000 mg | Freq: Once | INTRAVENOUS | Status: AC
  Administered 2020-10-10: 01:00:00 1000 mg via INTRAVENOUS
  Filled 2020-10-10: qty 200

## 2020-10-10 MED ORDER — VANCOMYCIN HCL 500 MG/100ML IV SOLN
500.0000 mg | INTRAVENOUS | Status: DC
  Administered 2020-10-11: 04:00:00 500 mg via INTRAVENOUS
  Filled 2020-10-10 (×2): qty 100

## 2020-10-10 MED ORDER — INSULIN ASPART 100 UNIT/ML ~~LOC~~ SOLN
0.0000 [IU] | Freq: Three times a day (TID) | SUBCUTANEOUS | Status: DC
  Administered 2020-10-10: 13:00:00 7 [IU] via SUBCUTANEOUS
  Administered 2020-10-10 – 2020-10-11 (×2): 3 [IU] via SUBCUTANEOUS
  Administered 2020-10-11: 12:00:00 5 [IU] via SUBCUTANEOUS
  Filled 2020-10-10 (×4): qty 1

## 2020-10-10 MED ORDER — CHLORHEXIDINE GLUCONATE 0.12 % MT SOLN
15.0000 mL | Freq: Two times a day (BID) | OROMUCOSAL | Status: DC
  Administered 2020-10-10 – 2020-10-11 (×2): 15 mL via OROMUCOSAL
  Filled 2020-10-10: qty 15

## 2020-10-10 MED ORDER — MAGNESIUM SULFATE 2 GM/50ML IV SOLN
2.0000 g | Freq: Once | INTRAVENOUS | Status: AC
  Administered 2020-10-10: 12:00:00 2 g via INTRAVENOUS
  Filled 2020-10-10: qty 50

## 2020-10-10 MED ORDER — ONDANSETRON HCL 4 MG PO TABS: 4.0000 mg | ORAL_TABLET | Freq: Four times a day (QID) | ORAL | Status: DC | PRN

## 2020-10-10 MED ORDER — SODIUM CHLORIDE 0.9 % IV SOLN
2.0000 g | Freq: Two times a day (BID) | INTRAVENOUS | Status: DC
  Administered 2020-10-10 – 2020-10-11 (×3): 2 g via INTRAVENOUS
  Filled 2020-10-10 (×5): qty 2

## 2020-10-10 MED ORDER — ACETAMINOPHEN 325 MG PO TABS: 650.0000 mg | ORAL_TABLET | Freq: Four times a day (QID) | ORAL | Status: DC | PRN

## 2020-10-10 NOTE — Progress Notes (Signed)
Pharmacy Antibiotic Note  Melanie Turner is a 86 y.o. female admitted on 10/09/2020 with altered mental status/possible sepsis. Pharmacy has been consulted for Cefepime and Vancomycin dosing.  Plan: Ordered Cefepime 2 gm q12hr and Vancomycin 500 mg q24hr per abx nomogram.  Pharmacy will continue to follow CrCl and adjust medications when warranted.  Pharmacy will continue to monitor BCx and UCx.  Height: 5\' 2"  (157.5 cm) Weight: 51.2 kg (112 lb 14.4 oz) IBW/kg (Calculated) : 50.1  Temp (24hrs), Avg:99.4 F (37.4 C), Min:98.2 F (36.8 C), Max:100.4 F (38 C)  Recent Labs  Lab 10/09/20 2018 10/09/20 2256  WBC 7.0  --   CREATININE 0.69  --   LATICACIDVEN 2.4* 1.5    Estimated Creatinine Clearance: 39.9 mL/min (by C-G formula based on SCr of 0.69 mg/dL).    No Known Allergies  Antimicrobials this admission: 12/11 Cefepime >>  12/11 Vancomycin >>     Microbiology results: 12/11 BCx: Pending 12/11 UCx: Pending   Thank you for allowing pharmacy to be a part of this patient's care.  14/11, PharmD, [facility] 10/10/2020 5:39 AM

## 2020-10-10 NOTE — Progress Notes (Signed)
Hypoglycemic Event  CBG: 60 at 1615  Treatment: 4oz of soda.  Symptoms: None  Follow-up CBG: Time:1717 CBG Result:76  Possible Reasons for Event: Poor appetite  Comments/MD notified: Dr. Myriam Forehand made aware.  Diet advancved to cardiac diet.     Melanie Turner

## 2020-10-10 NOTE — Progress Notes (Addendum)
Patient seen and examined on the bedside.  She has no complaints.  However, she is confused and unable to provide adequate history because of underlying dementia.  Her son reported that patient had eaten some of her stools the other night and is wondering whether that is the source of her infection.  Replete potassium and magnesium and monitor levels.  Repeat chest x-ray today.  Continue current management plan.  Plan of care was discussed with her son, Zoe Lan, at the bedside.

## 2020-10-10 NOTE — ED Provider Notes (Signed)
-----------------------------------------   12:01 AM on 10/10/2020 -----------------------------------------  Patient care assumed from Dr. Karsten Ro.  Patient's urinalysis has resulted overall normal.  Patient's lab work does show an elevated lactic acid at 2.4.  I spoke to the patient's family as the patient remains confused more so than baseline febrile to 100.4 tachycardia greater than 90 with a respiratory rate greater than 20 meeting sepsis criteria we will admit to the hospitalist service with sepsis of unknown source.  We will start the patient on broad-spectrum antibiotics until the patient's blood and urine cultures result.  We will dose 30 mL/kg of IV fluids given the patient's elevated lactic acid as well.   Minna Antis, MD 10/10/20 0002

## 2020-10-10 NOTE — H&P (Signed)
History and Physical    Melanie Turner HGD:[id number] DOB: 01-25-1932 DOA: 10/09/2020  PCP: Kirk Ruths, MD   Patient coming from: Home  I have personally briefly reviewed patient's old medical records in [place]  Chief Complaint: Altered mental status  HPI: Melanie Turner is a 86 y.o. female with medical history significant for DM, HTN, dementia who was brought to the emergency room after her daughter found her to not be acting herself.  She appeared more confused and she was holding food in her mouth and not swallowing.  She was also noted to have a cough but no fever.  Patient unable to contribute due to dementia.  History provided by son at bedside.  ED Course: On arrival she had a low-grade temperature of 100.2, tachycardic at 110, BP 154/100 with O2 sat 97% on room air.  CBC was normal but she had an elevated lactic acid of 2.4>>1.5, hypernatremia of 149 and serum glucose of 418.  Urinalysis unremarkable.  Covid and flu negative Imaging: Chest x-ray with no acute disease Due to fever tachycardia and elevated lactic acid patient met borderline sepsis criteria and was started on IV fluid bolus and IV antibiotics for sepsis of unknown source.  Hospitalist consulted for admission.  Review of Systems: Unable to obtain due to patient's dementia   Past Medical History:  Diagnosis Date  . Diabetes mellitus without complication ([place])   . Hypertension     Past Surgical History:  Procedure Laterality Date  . HIP ARTHROPLASTY Right 01/29/2020   Procedure: ARTHROPLASTY BIPOLAR HIP (HEMIARTHROPLASTY);  Surgeon: Thornton Park, MD;  Location: [facility] ORS;  Service: Orthopedics;  Laterality: Right;  . THYROIDECTOMY       reports that she has never smoked. She has never used smokeless tobacco. She reports current alcohol use. She reports that she does not use drugs.  No Known Allergies  Family History  Problem Relation Age of Onset  . Cancer Brother        not know which  type of cancer      Prior to Admission medications   Medication Sig Start Date End Date Taking? Authorizing Provider  amLODipine (NORVASC) 5 MG tablet Take 1 tablet (5 mg total) by mouth daily. 02/05/20   Vashti Hey, MD  clotrimazole-betamethasone (LOTRISONE) cream Apply 1 application topically 2 (two) times daily.    [provider]  donepezil (ARICEPT) 5 MG tablet Take 5 mg by mouth daily.  06/01/18   [provider]  enoxaparin (LOVENOX) 40 MG/0.4ML injection Inject 40 mg into the skin daily.    [provider]  Infant Care Products [facility] EX) Apply 1 application topically 2 (two) times daily. To sacrum and buttocks for nonspecific rash.    [provider]  metFORMIN (GLUCOPHAGE) 500 MG tablet Take 1 tablet (500 mg total) by mouth 2 (two) times daily with a meal for 30 days. 03/14/19 02/06/20  Amin, Jeanella Flattery, MD  mirtazapine (REMERON) 7.5 MG tablet Take 7.5 mg by mouth daily.  08/31/18   [provider]    Physical Exam: Vitals:   10/09/20 2330 10/09/20 2345 10/10/20 0008 10/10/20 0039  BP: (!) 159/92 (!) 173/95  (!) 195/92  Pulse: 96 97 (!) 105 99  Resp: (!) 24 (!) 27 (!) 23 19  Temp:   98.9 F (37.2 C)   TempSrc:      SpO2: 100% 99% 100% 97%     Vitals:   10/09/20 2330 10/09/20 2345 10/10/20 0008 10/10/20  0039  BP: (!) 159/92 (!) 173/95  (!) 195/92  Pulse: 96 97 (!) 105 99  Resp: (!) 24 (!) 27 (!) 23 19  Temp:   98.9 F (37.2 C)   TempSrc:      SpO2: 100% 99% 100% 97%      Constitutional:  Frail, chronically ill-appearing, lethargic, restless in bed.  HEENT:      Head: Normocephalic and atraumatic.         Eyes: PERLA, EOMI, Conjunctivae are normal. Sclera is non-icteric.       Mouth/Throat: Mucous membranes are moist.       Neck: Supple with no signs of meningismus. Cardiovascular: Regular rate and rhythm. No murmurs, gallops, or rubs. 2+ symmetrical distal pulses are present . No JVD. No LE  edema Respiratory: Respiratory effort normal .Lungs sounds clear bilaterally. No wheezes, crackles, or rhonchi.  Gastrointestinal: Soft, non tender, and non distended with positive bowel sounds.  Genitourinary: No CVA tenderness. Musculoskeletal: Nontender with normal range of motion in all extremities. No cyanosis, or erythema of extremities. Neurologic:  Face is symmetric. Moving all extremities. No gross focal neurologic deficits . Skin: Skin is warm, dry.  No rash or ulcers Psychiatric: Restless mild agitation   Labs on Admission: I have personally reviewed following labs and imaging studies  CBC: Recent Labs  Lab 10/09/20 2018  WBC 7.0  NEUTROABS 4.8  HGB 12.8  HCT 40.5  MCV 89.4  PLT 916   Basic Metabolic Panel: Recent Labs  Lab 10/09/20 2018  NA 149*  K 3.3*  CL 107  CO2 28  GLUCOSE 418*  BUN 16  CREATININE 0.69  CALCIUM 9.4   GFR: CrCl cannot be calculated (Unknown ideal weight.). Liver Function Tests: Recent Labs  Lab 10/09/20 2018  AST 21  ALT 14  ALKPHOS 86  BILITOT 1.3*  PROT 8.0  ALBUMIN 4.2   No results for input(s): LIPASE, AMYLASE in the last 168 hours. No results for input(s): AMMONIA in the last 168 hours. Coagulation Profile: No results for input(s): INR, PROTIME in the last 168 hours. Cardiac Enzymes: No results for input(s): CKTOTAL, CKMB, CKMBINDEX, TROPONINI in the last 168 hours. BNP (last 3 results) No results for input(s): PROBNP in the last 8760 hours. HbA1C: No results for input(s): HGBA1C in the last 72 hours. CBG: Recent Labs  Lab 10/10/20 0055  GLUCAP 316*   Lipid Profile: No results for input(s): CHOL, HDL, LDLCALC, TRIG, CHOLHDL, LDLDIRECT in the last 72 hours. Thyroid Function Tests: No results for input(s): TSH, T4TOTAL, FREET4, T3FREE, THYROIDAB in the last 72 hours. Anemia Panel: No results for input(s): VITAMINB12, FOLATE, FERRITIN, TIBC, IRON, RETICCTPCT in the last 72 hours. Urine analysis:    Component  Value Date/Time   COLORURINE STRAW (A) 10/09/2020 2206   APPEARANCEUR CLEAR (A) 10/09/2020 2206   APPEARANCEUR Clear 06/30/2014 1501   LABSPEC 1.030 10/09/2020 2206   LABSPEC 1.011 06/30/2014 1501   PHURINE 7.0 10/09/2020 2206   GLUCOSEU >=500 (A) 10/09/2020 2206   GLUCOSEU Negative 06/30/2014 1501   HGBUR NEGATIVE 10/09/2020 2206   [place] 10/09/2020 2206   BILIRUBINUR Negative 06/30/2014 1501   KETONESUR 5 (A) 10/09/2020 2206   PROTEINUR NEGATIVE 10/09/2020 2206   NITRITE NEGATIVE 10/09/2020 2206   LEUKOCYTESUR NEGATIVE 10/09/2020 2206   LEUKOCYTESUR Trace 06/30/2014 1501    Radiological Exams on Admission: DG Chest Port 1 View  Result Date: 10/09/2020 CLINICAL DATA:  Cough EXAM: PORTABLE CHEST 1 VIEW COMPARISON:  01/28/2020 FINDINGS: Heart  is normal size. Aorta normal caliber. Aortic calcifications. No confluent opacities or effusions. No acute bony abnormality. Degenerative changes in the shoulders. IMPRESSION: No active disease. Electronically Signed   By: Rolm Baptise M.D.   On: 10/09/2020 21:13     Assessment/Plan 86 year old female with history of DM, HTN, dementia presenting with altered mental status   Acute metabolic encephalopathy Sepsis ([place]) -Patient presented with altered mental status meeting borderline sepsis criteria with fever tachycardia and lactic acidosis.  WBC normal. -Covid and flu negative.  Urinalysis unremarkable and chest x-ray clear -Lactic acid improved with fluid bolus 2.4>1.5 -Continue cefepime and vancomycin for now for sepsis of unknown source and de-escalate if no further evidence of sepsis -Neurologic checks, fall and aspiration precautions    Hyperglycemia due to type 2 diabetes mellitus (HCC) -Serum blood sugar 418 -Sliding scale insulin coverage    Benign essential HTN -Continue home amlodipine    Dementia (HCC) without behavioral disturbance -Continue Remeron and donepezil    DVT prophylaxis: Lovenox  Code Status:  DNR  Family Communication: Son at bedside Disposition Plan: Back to previous home environment Consults called: none  Status:At the time of admission, it appears that the appropriate admission status for this patient is INPATIENT. This is judged to be reasonable and necessary in order to provide the required intensity of service to ensure the patient's safety given the presenting symptoms, physical exam findings, and initial radiographic and laboratory data in the context of their  Comorbid conditions.   Patient requires inpatient status due to high intensity of service, high risk for further deterioration and high frequency of surveillance required.   I certify that at the point of admission it is my clinical judgment that the patient will require inpatient hospital care spanning beyond [place] MD [facility]     10/10/2020, 1:00 AM

## 2020-10-10 NOTE — Progress Notes (Signed)
CODE SEPSIS - PHARMACY COMMUNICATION  **Broad Spectrum Antibiotics should be administered within 1 hour of Sepsis diagnosis**  Time Code Sepsis Called/Page Received: 0000  Antibiotics Ordered: Cefepime and Vancomycin  Time of 1st antibiotic administration: 0015  Otelia Sergeant, PharmD, [facility] 10/10/2020 1:37 AM

## 2020-10-11 DIAGNOSIS — E1165 Type 2 diabetes mellitus with hyperglycemia: Secondary | ICD-10-CM

## 2020-10-11 LAB — MAGNESIUM: Magnesium: 1.9 mg/dL (ref 1.7–2.4)

## 2020-10-11 LAB — BASIC METABOLIC PANEL
Anion gap: 7 (ref 5–15)
BUN: 12 mg/dL (ref 8–23)
CO2: 28 mmol/L (ref 22–32)
Calcium: 8.5 mg/dL — ABNORMAL LOW (ref 8.9–10.3)
Chloride: 100 mmol/L (ref 98–111)
Creatinine, Ser: 0.66 mg/dL (ref 0.44–1.00)
GFR, Estimated: >60 mL/min (ref >60)
Glucose, Bld: 238 mg/dL — ABNORMAL HIGH (ref 70–99)
Potassium: 3.4 mmol/L — ABNORMAL LOW (ref 3.5–5.1)
Sodium: 135 mmol/L (ref 135–145)

## 2020-10-11 LAB — GLUCOSE, CAPILLARY
Glucose-Capillary: 229 mg/dL — ABNORMAL HIGH (ref 70–99)
Glucose-Capillary: 284 mg/dL — ABNORMAL HIGH (ref 70–99)
Glucose-Capillary: 127 mg/dL — ABNORMAL HIGH (ref 70–99)

## 2020-10-11 LAB — URINE CULTURE

## 2020-10-11 MED ORDER — MIRTAZAPINE 15 MG PO TABS
7.5000 mg | ORAL_TABLET | Freq: Every day | ORAL | Status: DC
  Administered 2020-10-11: 09:00:00 7.5 mg via ORAL
  Filled 2020-10-11: qty 1

## 2020-10-11 MED ORDER — AMLODIPINE BESYLATE 5 MG PO TABS
5.0000 mg | ORAL_TABLET | Freq: Every day | ORAL | Status: DC
  Administered 2020-10-11: 09:00:00 5 mg via ORAL
  Filled 2020-10-11: qty 1

## 2020-10-11 MED ORDER — POTASSIUM CHLORIDE 20 MEQ PO PACK
40.0000 meq | PACK | Freq: Two times a day (BID) | ORAL | Status: DC
  Administered 2020-10-11: 09:00:00 40 meq via ORAL
  Filled 2020-10-11: qty 2

## 2020-10-11 MED ORDER — DONEPEZIL HCL 5 MG PO TABS
5.0000 mg | ORAL_TABLET | Freq: Every day | ORAL | Status: DC
  Administered 2020-10-11: 09:00:00 5 mg via ORAL
  Filled 2020-10-11: qty 1

## 2020-10-11 NOTE — Progress Notes (Addendum)
Patient is being discharged home. Discharge papers explained over the phone to patient's son, Karee Forge. Son verbalized understanding.  Awaiting transportation.   Notified son to follow up with PCP in one week.  Pt's son verbalized understanding.

## 2020-10-11 NOTE — Discharge Summary (Addendum)
Physician Discharge Summary  Melanie DenseClastene Rouch ZOX:[id number] DOB: [birth year] DOA: 10/09/2020  PCP: Melanie RegulusAnderson, Marshall W, MD  Admit date: 10/09/2020 Discharge date: 10/12/2020  Discharge disposition: Home   Recommendations for Outpatient Follow-Up:   Follow-up with PCP in 1 week   Discharge Diagnosis:   Principal Problem:   Acute metabolic encephalopathy Active Problems:   Benign essential HTN   Dementia (HCC)   Hypokalemia   Hyperglycemia due to type 2 diabetes mellitus (HCC)   Hypomagnesemia    Discharge Condition: Stable.  Diet recommendation:  Diet Order    None        Code Status: Prior     Hospital Course:   [name] is an 86 year old woman with medical history significant for type II DM, hypertension, dementia, history of right hip fracture in April 2021.  She was brought to the hospital because of worsening confusion on the fourth of shortness holding food in her mouth without swallowing.  Her son reported that she had eaten some of her feces.  She had a low-grade fever with temperature 100.4 in the ED and she was slightly tachycardic but hypertensive.  Lactic acid was slightly elevated at 2.4.  She was hyperglycemic, hypernatremic and dehydrated.  Initially, there was concern for sepsis so she was started on empiric IV antibiotics.  However, there was no evidence of infection so sepsis was ruled out.  She was also treated with IV fluids and NovoLog.  She had hypokalemia that was repleted.  She has not had any more fever in the hospital.  There has been no growth on blood cultures thus far (at the time of discharge).  Her condition has improved and she is deemed stable for discharge to home today.  Discharge plan was discussed with her son, Melanie Turner, at the bedside and he is agreeable with the plan.  He understands that patient may be called back into the hospital if there is any significant growth on blood culture.     Discharge Exam:    Vitals:    10/11/20 0532 10/11/20 0755 10/11/20 1154 10/11/20 1500  BP: (!) 180/72 (!) 191/98 (!) 153/93 (!) 156/95  Pulse: 89 87 92 91  Resp: 18 17 17 17   Temp: 98.2 F (36.8 C) 97.8 F (36.6 C) 98 F (36.7 C) 98.1 F (36.7 C)  TempSrc:      SpO2: 98%  100% 100%  Weight:      Height:         GEN: NAD SKIN: Warm and dry EYES: No pallor or icterus ENT: MMM CV: RRR PULM: CTA B ABD: soft, ND, NT, +BS CNS: AAO x 1 (person), non focal EXT: No edema or tenderness   The results of significant diagnostics from this hospitalization (including imaging, microbiology, ancillary and laboratory) are listed below for reference.     Procedures and Diagnostic Studies:   DG Chest Port 1 View  Result Date: 10/10/2020 CLINICAL DATA:  Fever and altered mental status. EXAM: PORTABLE CHEST 1 VIEW COMPARISON:  Radiograph yesterday. FINDINGS: Patient is rotated. The heart is normal in size. Aortic atherosclerosis and tortuosity, accentuated by rotation. No acute or focal airspace disease. No pulmonary edema. No pleural fluid. No pneumothorax. Stable osseous structures. Remote right rib fractures. IMPRESSION: Rotated exam without acute abnormality. Electronically Signed   By: Melanie RutherfordMelanie  Turner M.D.   On: 10/10/2020 15:39   DG Chest Port 1 View  Result Date: 10/09/2020 CLINICAL DATA:  Cough EXAM: PORTABLE CHEST 1 VIEW COMPARISON:  01/28/2020  FINDINGS: Heart is normal size. Aorta normal caliber. Aortic calcifications. No confluent opacities or effusions. No acute bony abnormality. Degenerative changes in the shoulders. IMPRESSION: No active disease. Electronically Signed   By: Charlett Nose M.D.   On: 10/09/2020 21:13     Labs:   Basic Metabolic Panel: Recent Labs  Lab 10/09/20 2018 10/10/20 0653 10/11/20 0408  NA 149* 144 135  K 3.3* 3.0* 3.4*  CL 107 106 100  CO2 28 25 28   GLUCOSE 418* 219* 238*  BUN 16 10 12   CREATININE 0.69 0.55 0.66  CALCIUM 9.4 8.6* 8.5*  MG  --  1.4* 1.9  PHOS  --  2.3*   --    GFR Estimated Creatinine Clearance: 39.9 mL/min (by C-G formula based on SCr of 0.66 mg/dL). Liver Function Tests: Recent Labs  Lab 10/09/20 2018  AST 21  ALT 14  ALKPHOS 86  BILITOT 1.3*  PROT 8.0  ALBUMIN 4.2   No results for input(s): LIPASE, AMYLASE in the last 168 hours. No results for input(s): AMMONIA in the last 168 hours. Coagulation profile Recent Labs  Lab 10/10/20 0653  INR 1.1    CBC: Recent Labs  Lab 10/09/20 2018 10/10/20 0653  WBC 7.0 6.9  NEUTROABS 4.8  --   HGB 12.8 12.3  HCT 40.5 38.2  MCV 89.4 87.6  PLT 227 205   Cardiac Enzymes: No results for input(s): CKTOTAL, CKMB, CKMBINDEX, TROPONINI in the last 168 hours. BNP: Invalid input(s): POCBNP CBG: Recent Labs  Lab 10/10/20 1717 10/10/20 2126 10/11/20 0820 10/11/20 1153 10/11/20 1719  GLUCAP 76 230* 229* 284* 127*   D-Dimer No results for input(s): DDIMER in the last 72 hours. Hgb A1c Recent Labs    10/09/20 2018  HGBA1C 10.1*   Lipid Profile No results for input(s): CHOL, HDL, LDLCALC, TRIG, CHOLHDL, LDLDIRECT in the last 72 hours. Thyroid function studies No results for input(s): TSH, T4TOTAL, T3FREE, THYROIDAB in the last 72 hours.  Invalid input(s): FREET3 Anemia work up No results for input(s): VITAMINB12, FOLATE, FERRITIN, TIBC, IRON, RETICCTPCT in the last 72 hours. Microbiology Recent Results (from the past 240 hour(s))  Resp Panel by RT-PCR (Flu A&B, Covid) Nasopharyngeal Swab     Status: None   Collection Time: 10/09/20 10:06 PM   Specimen: Nasopharyngeal Swab; Nasopharyngeal(NP) swabs in vial transport medium  Result Value Ref Range Status   SARS Coronavirus 2 by RT PCR NEGATIVE NEGATIVE Final    Comment: (NOTE) SARS-CoV-2 target nucleic acids are NOT DETECTED.  The SARS-CoV-2 RNA is generally detectable in upper respiratory specimens during the acute phase of infection. The lowest concentration of SARS-CoV-2 viral copies this assay can detect is 138  copies/mL. A negative result does not preclude SARS-Cov-2 infection and should not be used as the sole basis for treatment or other patient management decisions. A negative result may occur with  improper specimen collection/handling, submission of specimen other than nasopharyngeal swab, presence of viral mutation(s) within the areas targeted by this assay, and inadequate number of viral copies(<138 copies/mL). A negative result must be combined with clinical observations, patient history, and epidemiological information. The expected result is Negative.  Fact Sheet for Patients:  14/10/21  Fact Sheet for Healthcare Providers:  14/10/21  This test is no t yet approved or cleared by the [employer] FDA and  has been authorized for detection and/or diagnosis of SARS-CoV-2 by FDA under an Emergency Use Authorization (EUA). This EUA will remain  in effect (meaning this test  can be used) for the duration of the COVID-19 declaration under Section 564(b)(1) of the Act, 21 U.S.C.section 360bbb-3(b)(1), unless the authorization is terminated  or revoked sooner.       Influenza A by PCR NEGATIVE NEGATIVE Final   Influenza B by PCR NEGATIVE NEGATIVE Final    Comment: (NOTE) The Xpert Xpress SARS-CoV-2/FLU/RSV plus assay is intended as an aid in the diagnosis of influenza from Nasopharyngeal swab specimens and should not be used as a sole basis for treatment. Nasal washings and aspirates are unacceptable for Xpert Xpress SARS-CoV-2/FLU/RSV testing.  Fact Sheet for Patients: [employer]gerCourse[employer]com  Fact Sheet for Healthcare Providers: SeriousBroker.it  This test is not yet approved or cleared by the [place] FDA and has been authorized for detection and/or diagnosis of SARS-CoV-2 by FDA under an Emergency Use Authorization (EUA). This EUA will remain in effect (meaning  this test can be used) for the duration of the COVID-19 declaration under Section 564(b)(1) of the Act, 21 U.S.C. section 360bbb-3(b)(1), unless the authorization is terminated or revoked.  Performed at [facility] [address] [place], [place] [address]   Urine culture     Status: Abnormal   Collection Time: 10/09/20 10:06 PM   Specimen: In/Out Cath Urine  Result Value Ref Range Status   Specimen Description   Final    IN/OUT CATH URINE Performed at [facility], [address] [place], [place] [address]    Special Requests   Final    NONE Performed at [facility], [address] [place], [place] [address]    Culture MULTIPLE SPECIES PRESENT, SUGGEST RECOLLECTION (A)  Final   Report Status 10/11/2020 FINAL  Final  Blood Culture (routine x 2)     Status: None (Preliminary result)   Collection Time: 10/10/20 12:41 AM   Specimen: BLOOD  Result Value Ref Range Status   Specimen Description BLOOD BLOOD LEFT FOREARM  Final   Special Requests   Final    BOTTLES DRAWN AEROBIC ONLY Blood Culture adequate volume   Culture   Final    NO GROWTH 2 DAYS Performed at [facility], [address], [place], [place] [address]    Report Status PENDING  Incomplete  Blood Culture (routine x 2)     Status: None (Preliminary result)   Collection Time: 10/10/20  6:52 AM   Specimen: BLOOD  Result Value Ref Range Status   Specimen Description BLOOD RIGHT HAND  Final   Special Requests   Final    BOTTLES DRAWN AEROBIC AND ANAEROBIC Blood Culture adequate volume   Culture   Final    NO GROWTH 2 DAYS Performed at [facility], [address], [place], [place] [address]    Report Status PENDING  Incomplete     Discharge Instructions:   Discharge Instructions    Call MD for:  difficulty breathing, headache or visual disturbances   Complete by: As directed    Call MD for:  persistant dizziness or light-headedness   Complete by: As  directed    Call MD for:  persistant nausea and vomiting   Complete by: As directed    Call MD for:  temperature >100.4   Complete by: As directed    Increase activity slowly   Complete by: As directed      Allergies as of 10/11/2020   No Known Allergies     Medication List    STOP taking these medications   enoxaparin 40 MG/0.4ML injection Commonly  known as: LOVENOX     TAKE these medications   amLODipine 5 MG tablet Commonly known as: NORVASC Take 1 tablet (5 mg total) by mouth daily.   clotrimazole-betamethasone cream Commonly known as: LOTRISONE Apply 1 application topically 2 (two) times daily.   DERMACLOUD EX Apply 1 application topically 2 (two) times daily. To sacrum and buttocks for nonspecific rash.   donepezil 5 MG tablet Commonly known as: ARICEPT Take 5 mg by mouth daily.   metFORMIN 500 MG tablet Commonly known as: Glucophage Take 1 tablet (500 mg total) by mouth 2 (two) times daily with a meal for 30 days.   mirtazapine 7.5 MG tablet Commonly known as: REMERON Take 7.5 mg by mouth daily.         Time coordinating discharge: 32 minutes  Signed:  Chistina Roston  [facility] 10/12/2020, 11:23 AM   Pager on [email or web address]. If 7PM-7AM, please contact night-coverage at [email or web address]

## 2020-10-15 LAB — CULTURE, BLOOD (ROUTINE X 2)
Culture: NO GROWTH
Culture: NO GROWTH
Special Requests: ADEQUATE
Special Requests: ADEQUATE

## 2020-10-21 DIAGNOSIS — F0390 Unspecified dementia without behavioral disturbance: Secondary | ICD-10-CM | POA: Diagnosis not present

## 2020-10-21 DIAGNOSIS — N182 Chronic kidney disease, stage 2 (mild): Secondary | ICD-10-CM | POA: Diagnosis not present

## 2020-10-21 DIAGNOSIS — Z8739 Personal history of other diseases of the musculoskeletal system and connective tissue: Secondary | ICD-10-CM | POA: Diagnosis not present

## 2020-10-21 DIAGNOSIS — Z0000 Encounter for general adult medical examination without abnormal findings: Secondary | ICD-10-CM | POA: Diagnosis not present

## 2020-10-21 DIAGNOSIS — I10 Essential (primary) hypertension: Secondary | ICD-10-CM | POA: Diagnosis not present

## 2020-10-21 DIAGNOSIS — E1122 Type 2 diabetes mellitus with diabetic chronic kidney disease: Secondary | ICD-10-CM | POA: Diagnosis not present

## 2020-10-21 DIAGNOSIS — E441 Mild protein-calorie malnutrition: Secondary | ICD-10-CM | POA: Diagnosis not present

## 2020-11-24 ENCOUNTER — Other Ambulatory Visit: Payer: Self-pay

## 2020-11-24 ENCOUNTER — Emergency Department: Payer: Medicare HMO

## 2020-11-24 ENCOUNTER — Inpatient Hospital Stay
Admission: EM | Admit: 2020-11-24 | Discharge: 2020-11-27 | DRG: 871 | Disposition: A | Discharge status: Home Health | Payer: Medicare HMO | Attending: Internal Medicine | Admitting: Internal Medicine

## 2020-11-24 DIAGNOSIS — R4182 Altered mental status, unspecified: Secondary | ICD-10-CM | POA: Diagnosis not present

## 2020-11-24 DIAGNOSIS — Z7984 Long term (current) use of oral hypoglycemic drugs: Secondary | ICD-10-CM | POA: Diagnosis not present

## 2020-11-24 DIAGNOSIS — A4189 Other specified sepsis: Principal | ICD-10-CM | POA: Diagnosis present

## 2020-11-24 DIAGNOSIS — Z96641 Presence of right artificial hip joint: Secondary | ICD-10-CM | POA: Diagnosis present

## 2020-11-24 DIAGNOSIS — E11649 Type 2 diabetes mellitus with hypoglycemia without coma: Secondary | ICD-10-CM | POA: Diagnosis not present

## 2020-11-24 DIAGNOSIS — G9340 Encephalopathy, unspecified: Secondary | ICD-10-CM | POA: Diagnosis not present

## 2020-11-24 DIAGNOSIS — G9341 Metabolic encephalopathy: Secondary | ICD-10-CM | POA: Diagnosis present

## 2020-11-24 DIAGNOSIS — R6520 Severe sepsis without septic shock: Secondary | ICD-10-CM | POA: Diagnosis not present

## 2020-11-24 DIAGNOSIS — G309 Alzheimer's disease, unspecified: Secondary | ICD-10-CM

## 2020-11-24 DIAGNOSIS — F0390 Unspecified dementia without behavioral disturbance: Secondary | ICD-10-CM | POA: Diagnosis present

## 2020-11-24 DIAGNOSIS — Z66 Do not resuscitate: Secondary | ICD-10-CM | POA: Diagnosis present

## 2020-11-24 DIAGNOSIS — A0839 Other viral enteritis: Secondary | ICD-10-CM | POA: Diagnosis present

## 2020-11-24 DIAGNOSIS — S72001A Fracture of unspecified part of neck of right femur, initial encounter for closed fracture: Secondary | ICD-10-CM | POA: Diagnosis present

## 2020-11-24 DIAGNOSIS — R1110 Vomiting, unspecified: Secondary | ICD-10-CM | POA: Diagnosis not present

## 2020-11-24 DIAGNOSIS — E876 Hypokalemia: Secondary | ICD-10-CM | POA: Diagnosis not present

## 2020-11-24 DIAGNOSIS — J1282 Pneumonia due to coronavirus disease 2019: Secondary | ICD-10-CM | POA: Diagnosis present

## 2020-11-24 DIAGNOSIS — F0280 Dementia in other diseases classified elsewhere without behavioral disturbance: Secondary | ICD-10-CM

## 2020-11-24 DIAGNOSIS — I10 Essential (primary) hypertension: Secondary | ICD-10-CM | POA: Diagnosis present

## 2020-11-24 DIAGNOSIS — E1129 Type 2 diabetes mellitus with other diabetic kidney complication: Secondary | ICD-10-CM | POA: Diagnosis present

## 2020-11-24 DIAGNOSIS — U071 COVID-19: Secondary | ICD-10-CM | POA: Diagnosis present

## 2020-11-24 DIAGNOSIS — S72001S Fracture of unspecified part of neck of right femur, sequela: Secondary | ICD-10-CM

## 2020-11-24 DIAGNOSIS — M25851 Other specified joint disorders, right hip: Secondary | ICD-10-CM | POA: Diagnosis present

## 2020-11-24 DIAGNOSIS — Z79899 Other long term (current) drug therapy: Secondary | ICD-10-CM

## 2020-11-24 DIAGNOSIS — A419 Sepsis, unspecified organism: Secondary | ICD-10-CM | POA: Diagnosis present

## 2020-11-24 DIAGNOSIS — E1165 Type 2 diabetes mellitus with hyperglycemia: Secondary | ICD-10-CM | POA: Diagnosis present

## 2020-11-24 DIAGNOSIS — E1110 Type 2 diabetes mellitus with ketoacidosis without coma: Secondary | ICD-10-CM | POA: Diagnosis present

## 2020-11-24 LAB — URINALYSIS, COMPLETE (UACMP) WITH MICROSCOPIC
Bilirubin Urine: NEGATIVE
Glucose, UA: >=500 mg/dL — AB
Ketones, ur: 20 mg/dL — AB
Leukocytes,Ua: NEGATIVE
Nitrite: NEGATIVE
Protein, ur: 100 mg/dL — AB
Specific Gravity, Urine: 1.026 (ref 1.005–1.030)
Squamous Epithelial / HPF: NONE SEEN (ref 0–5)
pH: 6 (ref 5.0–8.0)

## 2020-11-24 LAB — BASIC METABOLIC PANEL
Anion gap: 15 (ref 5–15)
Anion gap: 8 (ref 5–15)
BUN: 14 mg/dL (ref 8–23)
BUN: 11 mg/dL (ref 8–23)
CO2: 23 mmol/L (ref 22–32)
CO2: 28 mmol/L (ref 22–32)
Calcium: 8.6 mg/dL — ABNORMAL LOW (ref 8.9–10.3)
Calcium: 8.4 mg/dL — ABNORMAL LOW (ref 8.9–10.3)
Chloride: 90 mmol/L — ABNORMAL LOW (ref 98–111)
Chloride: 96 mmol/L — ABNORMAL LOW (ref 98–111)
Creatinine, Ser: 0.61 mg/dL (ref 0.44–1.00)
Creatinine, Ser: 0.62 mg/dL (ref 0.44–1.00)
GFR, Estimated: >60 mL/min (ref >60)
GFR, Estimated: >60 mL/min (ref >60)
Glucose, Bld: 306 mg/dL — ABNORMAL HIGH (ref 70–99)
Glucose, Bld: 218 mg/dL — ABNORMAL HIGH (ref 70–99)
Potassium: 3.5 mmol/L (ref 3.5–5.1)
Potassium: 4.4 mmol/L (ref 3.5–5.1)
Sodium: 128 mmol/L — ABNORMAL LOW (ref 135–145)
Sodium: 132 mmol/L — ABNORMAL LOW (ref 135–145)

## 2020-11-24 LAB — TSH: TSH: 1.243 u[IU]/mL (ref 0.350–4.500)

## 2020-11-24 LAB — COMPREHENSIVE METABOLIC PANEL
ALT: 18 U/L (ref 0–44)
AST: 39 U/L (ref 15–41)
Albumin: 4.1 g/dL (ref 3.5–5.0)
Alkaline Phosphatase: 82 U/L (ref 38–126)
Anion gap: 23 — ABNORMAL HIGH (ref 5–15)
BUN: 14 mg/dL (ref 8–23)
CO2: 19 mmol/L — ABNORMAL LOW (ref 22–32)
Calcium: 9.1 mg/dL (ref 8.9–10.3)
Chloride: 87 mmol/L — ABNORMAL LOW (ref 98–111)
Creatinine, Ser: 0.99 mg/dL (ref 0.44–1.00)
GFR, Estimated: 56 mL/min — ABNORMAL LOW (ref >60)
Glucose, Bld: 501 mg/dL (ref 70–99)
Potassium: 3.5 mmol/L (ref 3.5–5.1)
Sodium: 129 mmol/L — ABNORMAL LOW (ref 135–145)
Total Bilirubin: 1.1 mg/dL (ref 0.3–1.2)
Total Protein: 8.5 g/dL — ABNORMAL HIGH (ref 6.5–8.1)

## 2020-11-24 LAB — CBC WITH DIFFERENTIAL/PLATELET
Abs Immature Granulocytes: 0.04 10*3/uL (ref 0.00–0.07)
Basophils Absolute: 0 10*3/uL (ref 0.0–0.1)
Basophils Relative: 0 %
Eosinophils Absolute: 0 10*3/uL (ref 0.0–0.5)
Eosinophils Relative: 0 %
HCT: 38.1 % (ref 36.0–46.0)
Hemoglobin: 12.6 g/dL (ref 12.0–15.0)
Immature Granulocytes: 0 %
Lymphocytes Relative: 4 %
Lymphs Abs: 0.4 10*3/uL — ABNORMAL LOW (ref 0.7–4.0)
MCH: 28.5 pg (ref 26.0–34.0)
MCHC: 33.1 g/dL (ref 30.0–36.0)
MCV: 86.2 fL (ref 80.0–100.0)
Monocytes Absolute: 0.5 10*3/uL (ref 0.1–1.0)
Monocytes Relative: 4 %
Neutro Abs: 10.3 10*3/uL — ABNORMAL HIGH (ref 1.7–7.7)
Neutrophils Relative %: 92 %
Platelets: 295 10*3/uL (ref 150–400)
RBC: 4.42 MIL/uL (ref 3.87–5.11)
RDW: 13.6 % (ref 11.5–15.5)
WBC: 11.2 10*3/uL — ABNORMAL HIGH (ref 4.0–10.5)
nRBC: 0 % (ref 0.0–0.2)

## 2020-11-24 LAB — CBG MONITORING, ED
Glucose-Capillary: 457 mg/dL — ABNORMAL HIGH (ref 70–99)
Glucose-Capillary: 226 mg/dL — ABNORMAL HIGH (ref 70–99)
Glucose-Capillary: 261 mg/dL — ABNORMAL HIGH (ref 70–99)
Glucose-Capillary: 242 mg/dL — ABNORMAL HIGH (ref 70–99)
Glucose-Capillary: 176 mg/dL — ABNORMAL HIGH (ref 70–99)

## 2020-11-24 LAB — POC SARS CORONAVIRUS 2 AG -  ED: SARS Coronavirus 2 Ag: NEGATIVE

## 2020-11-24 LAB — PROTIME-INR
INR: 1.1 (ref 0.8–1.2)
Prothrombin Time: 13.3 seconds (ref 11.4–15.2)

## 2020-11-24 LAB — SARS CORONAVIRUS 2 BY RT PCR (HOSPITAL ORDER, PERFORMED IN ~~LOC~~ HOSPITAL LAB): SARS Coronavirus 2: POSITIVE — AB

## 2020-11-24 LAB — LACTIC ACID, PLASMA
Lactic Acid, Venous: 9.9 mmol/L (ref 0.5–1.9)
Lactic Acid, Venous: 5.1 mmol/L (ref 0.5–1.9)

## 2020-11-24 LAB — APTT: aPTT: <24 seconds — ABNORMAL LOW (ref 24–36)

## 2020-11-24 LAB — TROPONIN I (HIGH SENSITIVITY): Troponin I (High Sensitivity): <2 ng/L (ref <18)

## 2020-11-24 MED ORDER — ENOXAPARIN SODIUM 40 MG/0.4ML ~~LOC~~ SOLN
40.0000 mg | SUBCUTANEOUS | Status: DC
  Administered 2020-11-24 – 2020-11-26 (×3): 40 mg via SUBCUTANEOUS
  Filled 2020-11-24 (×3): qty 0.4

## 2020-11-24 MED ORDER — DEXTROSE IN LACTATED RINGERS 5 % IV SOLN: INTRAVENOUS | Status: DC

## 2020-11-24 MED ORDER — ACETAMINOPHEN 325 MG PO TABS: 650.0000 mg | ORAL_TABLET | Freq: Four times a day (QID) | ORAL | Status: DC | PRN

## 2020-11-24 MED ORDER — INSULIN ASPART 100 UNIT/ML ~~LOC~~ SOLN
3.0000 [IU] | Freq: Three times a day (TID) | SUBCUTANEOUS | Status: DC
  Administered 2020-11-25 (×2): 3 [IU] via SUBCUTANEOUS
  Filled 2020-11-24 (×2): qty 1

## 2020-11-24 MED ORDER — SODIUM CHLORIDE 0.9 % IV SOLN
2.0000 g | Freq: Once | INTRAVENOUS | Status: AC
  Administered 2020-11-24: 2 g via INTRAVENOUS
  Filled 2020-11-24: qty 2

## 2020-11-24 MED ORDER — HYDROCOD POLST-CPM POLST ER 10-8 MG/5ML PO SUER
5.0000 mL | Freq: Two times a day (BID) | ORAL | Status: DC | PRN
  Administered 2020-11-25 – 2020-11-27 (×2): 5 mL via ORAL
  Filled 2020-11-24 (×2): qty 5

## 2020-11-24 MED ORDER — ONDANSETRON HCL 4 MG/2ML IJ SOLN: 4.0000 mg | Freq: Four times a day (QID) | INTRAMUSCULAR | Status: DC | PRN

## 2020-11-24 MED ORDER — IOHEXOL 350 MG/ML SOLN
75.0000 mL | Freq: Once | INTRAVENOUS | Status: AC | PRN
  Administered 2020-11-24: 75 mL via INTRAVENOUS

## 2020-11-24 MED ORDER — INSULIN ASPART 100 UNIT/ML ~~LOC~~ SOLN
0.0000 [IU] | Freq: Three times a day (TID) | SUBCUTANEOUS | Status: DC
  Administered 2020-11-25: 2 [IU] via SUBCUTANEOUS
  Administered 2020-11-25: 3 [IU] via SUBCUTANEOUS
  Administered 2020-11-26: 8 [IU] via SUBCUTANEOUS
  Administered 2020-11-26: 12:00:00 5 [IU] via SUBCUTANEOUS
  Administered 2020-11-27: 09:00:00 3 [IU] via SUBCUTANEOUS
  Filled 2020-11-24 (×5): qty 1

## 2020-11-24 MED ORDER — LACTATED RINGERS IV BOLUS (SEPSIS)
1000.0000 mL | Freq: Once | INTRAVENOUS | Status: AC
  Administered 2020-11-24: 1000 mL via INTRAVENOUS

## 2020-11-24 MED ORDER — INSULIN ASPART 100 UNIT/ML ~~LOC~~ SOLN
6.0000 [IU] | Freq: Once | SUBCUTANEOUS | Status: AC
  Administered 2020-11-24: 6 [IU] via INTRAVENOUS
  Filled 2020-11-24: qty 1

## 2020-11-24 MED ORDER — VANCOMYCIN HCL IN DEXTROSE 1-5 GM/200ML-% IV SOLN: 1000.0000 mg | Freq: Once | INTRAVENOUS | Status: DC

## 2020-11-24 MED ORDER — METHYLPREDNISOLONE SODIUM SUCC 40 MG IJ SOLR
0.5000 mg/kg | Freq: Two times a day (BID) | INTRAMUSCULAR | Status: DC
  Administered 2020-11-24 – 2020-11-26 (×5): 26 mg via INTRAVENOUS
  Filled 2020-11-24 (×5): qty 1

## 2020-11-24 MED ORDER — PREDNISONE 50 MG PO TABS: 50.0000 mg | ORAL_TABLET | Freq: Every day | ORAL | Status: DC

## 2020-11-24 MED ORDER — ACETAMINOPHEN 650 MG RE SUPP: 650.0000 mg | Freq: Four times a day (QID) | RECTAL | Status: DC | PRN

## 2020-11-24 MED ORDER — DEXTROSE 50 % IV SOLN
0.0000 mL | INTRAVENOUS | Status: DC | PRN
  Administered 2020-11-26: 05:00:00 25 mL via INTRAVENOUS
  Filled 2020-11-24: qty 50

## 2020-11-24 MED ORDER — GUAIFENESIN-DM 100-10 MG/5ML PO SYRP: 10.0000 mL | ORAL_SOLUTION | ORAL | Status: DC | PRN

## 2020-11-24 MED ORDER — INSULIN ASPART 100 UNIT/ML ~~LOC~~ SOLN: 0.0000 [IU] | Freq: Every day | SUBCUTANEOUS | Status: DC

## 2020-11-24 MED ORDER — SODIUM CHLORIDE 0.9 % IV SOLN
200.0000 mg | Freq: Once | INTRAVENOUS | Status: AC
  Administered 2020-11-24: 200 mg via INTRAVENOUS
  Filled 2020-11-24: qty 200

## 2020-11-24 MED ORDER — LACTATED RINGERS IV BOLUS (SEPSIS)
500.0000 mL | Freq: Once | INTRAVENOUS | Status: AC
  Administered 2020-11-24: 500 mL via INTRAVENOUS

## 2020-11-24 MED ORDER — METRONIDAZOLE IN NACL 5-0.79 MG/ML-% IV SOLN
500.0000 mg | Freq: Three times a day (TID) | INTRAVENOUS | Status: DC
  Administered 2020-11-24 – 2020-11-25 (×2): 500 mg via INTRAVENOUS
  Filled 2020-11-24 (×2): qty 100

## 2020-11-24 MED ORDER — INSULIN DETEMIR 100 UNIT/ML ~~LOC~~ SOLN
0.3000 [IU]/kg | SUBCUTANEOUS | Status: DC
  Administered 2020-11-24 – 2020-11-25 (×2): 16 [IU] via SUBCUTANEOUS
  Filled 2020-11-24 (×3): qty 0.16

## 2020-11-24 MED ORDER — ADULT MULTIVITAMIN W/MINERALS CH
1.0000 | ORAL_TABLET | Freq: Every day | ORAL | Status: DC
  Administered 2020-11-25 – 2020-11-27 (×3): 1 via ORAL
  Filled 2020-11-24 (×4): qty 1

## 2020-11-24 MED ORDER — SODIUM CHLORIDE 0.9 % IV SOLN: 2.0000 g | Freq: Once | INTRAVENOUS | Status: DC

## 2020-11-24 MED ORDER — POTASSIUM CHLORIDE 10 MEQ/100ML IV SOLN
10.0000 meq | INTRAVENOUS | Status: AC
  Administered 2020-11-24 (×2): 10 meq via INTRAVENOUS
  Filled 2020-11-24 (×3): qty 100

## 2020-11-24 MED ORDER — VANCOMYCIN HCL IN DEXTROSE 1-5 GM/200ML-% IV SOLN
1000.0000 mg | Freq: Once | INTRAVENOUS | Status: AC
  Administered 2020-11-24: 1000 mg via INTRAVENOUS
  Filled 2020-11-24: qty 200

## 2020-11-24 MED ORDER — AMLODIPINE BESYLATE 5 MG PO TABS
5.0000 mg | ORAL_TABLET | Freq: Every day | ORAL | Status: DC
  Administered 2020-11-25 – 2020-11-27 (×3): 5 mg via ORAL
  Filled 2020-11-24 (×4): qty 1

## 2020-11-24 MED ORDER — LACTATED RINGERS IV BOLUS: 1000.0000 mL | Freq: Once | INTRAVENOUS | Status: DC

## 2020-11-24 MED ORDER — DONEPEZIL HCL 5 MG PO TABS
5.0000 mg | ORAL_TABLET | Freq: Every day | ORAL | Status: DC
  Administered 2020-11-25 – 2020-11-26 (×2): 5 mg via ORAL
  Filled 2020-11-24 (×2): qty 1

## 2020-11-24 MED ORDER — ASCORBIC ACID 500 MG PO TABS
500.0000 mg | ORAL_TABLET | Freq: Every day | ORAL | Status: DC
  Administered 2020-11-25 – 2020-11-27 (×3): 500 mg via ORAL
  Filled 2020-11-24 (×4): qty 1

## 2020-11-24 MED ORDER — ONDANSETRON HCL 4 MG PO TABS: 4.0000 mg | ORAL_TABLET | Freq: Four times a day (QID) | ORAL | Status: DC | PRN

## 2020-11-24 MED ORDER — INSULIN REGULAR(HUMAN) IN NACL 100-0.9 UT/100ML-% IV SOLN
INTRAVENOUS | Status: DC
  Administered 2020-11-24: 2.2 [IU]/h via INTRAVENOUS
  Filled 2020-11-24: qty 100

## 2020-11-24 MED ORDER — LACTATED RINGERS IV SOLN: INTRAVENOUS | Status: DC

## 2020-11-24 MED ORDER — ZINC SULFATE 220 (50 ZN) MG PO CAPS
220.0000 mg | ORAL_CAPSULE | Freq: Every day | ORAL | Status: DC
  Administered 2020-11-25 – 2020-11-27 (×3): 220 mg via ORAL
  Filled 2020-11-24 (×4): qty 1

## 2020-11-24 MED ORDER — METRONIDAZOLE IN NACL 5-0.79 MG/ML-% IV SOLN: 500.0000 mg | Freq: Three times a day (TID) | INTRAVENOUS | Status: DC

## 2020-11-24 MED ORDER — SODIUM CHLORIDE 0.9 % IV SOLN
100.0000 mg | Freq: Every day | INTRAVENOUS | Status: DC
  Administered 2020-11-25: 100 mg via INTRAVENOUS
  Filled 2020-11-24: qty 20

## 2020-11-24 MED ORDER — LABETALOL HCL 5 MG/ML IV SOLN
5.0000 mg | Freq: Once | INTRAVENOUS | Status: AC
  Administered 2020-11-24: 5 mg via INTRAVENOUS
  Filled 2020-11-24: qty 4

## 2020-11-24 MED ORDER — SODIUM CHLORIDE 0.9 % IV SOLN
2.0000 g | INTRAVENOUS | Status: DC
  Administered 2020-11-25 – 2020-11-26 (×2): 2 g via INTRAVENOUS
  Filled 2020-11-24 (×2): qty 2

## 2020-11-24 MED ORDER — METRONIDAZOLE IN NACL 5-0.79 MG/ML-% IV SOLN
500.0000 mg | Freq: Once | INTRAVENOUS | Status: AC
  Administered 2020-11-24: 500 mg via INTRAVENOUS
  Filled 2020-11-24: qty 100

## 2020-11-24 MED ORDER — VANCOMYCIN HCL 750 MG/150ML IV SOLN
750.0000 mg | INTRAVENOUS | Status: DC
  Filled 2020-11-24: qty 150

## 2020-11-24 MED ORDER — MIRTAZAPINE 15 MG PO TABS
7.5000 mg | ORAL_TABLET | Freq: Every day | ORAL | Status: DC
  Administered 2020-11-25 – 2020-11-26 (×2): 7.5 mg via ORAL
  Filled 2020-11-24 (×2): qty 1

## 2020-11-24 NOTE — Progress Notes (Signed)
Following for code sepsis 

## 2020-11-24 NOTE — Progress Notes (Addendum)
Inpatient Diabetes Program Recommendations  AACE/ADA: New Consensus Statement on Inpatient Glycemic Control (2015)  Target Ranges:  Prepandial:   less than 140 mg/dL      Peak postprandial:   less than 180 mg/dL (1-2 hours)      Critically ill patients:  140 - 180 mg/dL   Lab Results  Component Value Date   GLUCAP 242 (H) 11/24/2020   HGBA1C 10.1 (H) 10/09/2020    Review of Glycemic Control Results for Melanie, Turner (MRN [id number]) as of 11/24/2020 15:59  Ref. Range 11/24/2020 13:21 11/24/2020 14:44 11/24/2020 15:48  Glucose-Capillary Latest Ref Range: 70 - 99 mg/dL 275 (H) 170 (H) 017 (H)   Diabetes history: DM 2 Outpatient Diabetes medications:  Metformin 500 mg bid,  Current orders for Inpatient glycemic control:  IV insulin Note steroids also being started Inpatient Diabetes Program Recommendations:    Once CBG's within goal of 140-180 and anion gap closed, consider adding Levemir 8 units bid (0.15 units/kg). She will also need Novolog correction sensitive q 4 hours. Will follow.   Thanks,  Beryl Meager, RN, BC-ADM Inpatient Diabetes Coordinator Pager [phone] (8a-5p)

## 2020-11-24 NOTE — ED Provider Notes (Signed)
[facility] Emergency Department Provider Note    Event Date/Time   First MD Initiated Contact with Patient 11/24/20 [phone]     (approximate)  I have reviewed the triage vital signs and the nursing notes.   HISTORY  Chief Complaint Altered Mental Status  Level V Caveat: AMS  HPI Melanie Turner is a 87 y.o. female below listed past medical history presents to the ER for altered mental status nausea and vomiting times at least 24 hours.  Not complaining of any specific symptoms at this time.  Patient's son brought her to the ER today after he got off night shift reported through the night was patient was increasingly confused.  No reported fevers or chills.  No cough.  No one else sick at home.    Past Medical History:  Diagnosis Date  . Diabetes mellitus without complication ([place])   . Hypertension    Family History  Problem Relation Age of Onset  . Cancer Brother        not know which type of cancer   Past Surgical History:  Procedure Laterality Date  . HIP ARTHROPLASTY Right 01/29/2020   Procedure: ARTHROPLASTY BIPOLAR HIP (HEMIARTHROPLASTY);  Surgeon: Thornton Park, MD;  Location: [facility] ORS;  Service: Orthopedics;  Laterality: Right;  . THYROIDECTOMY     Patient Active Problem List   Diagnosis Date Noted  . Acute metabolic encephalopathy 03/00/9233  . Hyperglycemia due to type 2 diabetes mellitus ([place]) 10/10/2020  . Hypomagnesemia 10/10/2020  . UTI (urinary tract infection) 01/28/2020  . Dementia ([place]) 01/28/2020  . Hypokalemia 01/28/2020  . Fall at home, initial encounter 01/28/2020  . Closed right hip fracture ([place]) 01/28/2020  . Type II diabetes mellitus with renal manifestations ([place]) 03/14/2019  . Benign essential HTN 03/14/2019  . Seizure ([place]) 03/08/2019      Prior to Admission medications   Medication Sig Start Date End Date Taking? Authorizing Provider  amLODipine (NORVASC) 5 MG tablet Take 1 tablet (5 mg total) by mouth  daily. 02/05/20   Vashti Hey, MD  clotrimazole-betamethasone (LOTRISONE) cream Apply 1 application topically 2 (two) times daily.    [provider]  donepezil (ARICEPT) 5 MG tablet Take 5 mg by mouth daily.  06/01/18   [provider]  Infant [employer] Products (DERMACLOUD EX) Apply 1 application topically 2 (two) times daily. To sacrum and buttocks for nonspecific rash.    [provider]  metFORMIN (GLUCOPHAGE) 500 MG tablet Take 1 tablet (500 mg total) by mouth 2 (two) times daily with a meal for 30 days. 03/14/19 02/06/20  Amin, Jeanella Flattery, MD  mirtazapine (REMERON) 7.5 MG tablet Take 7.5 mg by mouth daily.  08/31/18   [provider]    Allergies Patient has no known allergies.    Social History Social History   Tobacco Use  . Smoking status: Never Smoker  . Smokeless tobacco: Never Used  Vaping Use  . Vaping Use: Never used  Substance Use Topics  . Alcohol use: Yes  . Drug use: No    Review of Systems Patient denies headaches, rhinorrhea, blurry vision, numbness, shortness of breath, chest pain, edema, cough, abdominal pain, nausea, vomiting, diarrhea, dysuria, fevers, rashes or hallucinations unless otherwise stated above in HPI. ____________________________________________   PHYSICAL EXAM:  VITAL SIGNS: Vitals:   11/24/20 1245 11/24/20 1300  BP:  (!) 192/113  Pulse: 93 98  Resp:    Temp:    SpO2: 96% 100%    Constitutional: Alert,  frail appearing Eyes: Conjunctivae are normal.  Head: Atraumatic. Nose: No congestion/rhinnorhea. Mouth/Throat: Mucous membranes are moist.   Neck: No stridor. Painless ROM.  Cardiovascular: Normal rate, regular rhythm. Grossly normal heart sounds.  Good peripheral circulation. Respiratory: Normal respiratory effort.  No retractions. Lungs CTAB. Gastrointestinal: Soft and nontender. No distention. No abdominal bruits. No CVA tenderness. Genitourinary:  Musculoskeletal: No lower extremity  tenderness nor edema.  No joint effusions. Neurologic:  Normal speech and language. No gross focal neurologic deficits are appreciated. No facial droop Skin:  Skin is warm, dry and intact. No rash noted. Psychiatric: Mood and affect are normal. Speech and behavior are normal.  ____________________________________________   LABS (all labs ordered are listed, but only abnormal results are displayed)  Results for orders placed or performed during the hospital encounter of 11/24/20 (from the past 24 hour(s))  CBG monitoring, ED     Status: Abnormal   Collection Time: 11/24/20  8:56 AM  Result Value Ref Range   Glucose-Capillary 457 (H) 70 - 99 mg/dL  Lactic acid, plasma     Status: Abnormal   Collection Time: 11/24/20  9:07 AM  Result Value Ref Range   Lactic Acid, Venous 9.9 (HH) 0.5 - 1.9 mmol/L  Comprehensive metabolic panel     Status: Abnormal   Collection Time: 11/24/20  9:07 AM  Result Value Ref Range   Sodium 129 (L) 135 - 145 mmol/L   Potassium 3.5 3.5 - 5.1 mmol/L   Chloride 87 (L) 98 - 111 mmol/L   CO2 19 (L) 22 - 32 mmol/L   Glucose, Bld 501 (HH) 70 - 99 mg/dL   BUN 14 8 - 23 mg/dL   Creatinine, Ser 0.99 0.44 - 1.00 mg/dL   Calcium 9.1 8.9 - 10.3 mg/dL   Total Protein 8.5 (H) 6.5 - 8.1 g/dL   Albumin 4.1 3.5 - 5.0 g/dL   AST 39 15 - 41 U/L   ALT 18 0 - 44 U/L   Alkaline Phosphatase 82 38 - 126 U/L   Total Bilirubin 1.1 0.3 - 1.2 mg/dL   GFR, Estimated 56 (L) >60 mL/min   Anion gap 23 (H) 5 - 15  CBC WITH DIFFERENTIAL     Status: Abnormal   Collection Time: 11/24/20  9:07 AM  Result Value Ref Range   WBC 11.2 (H) 4.0 - 10.5 K/uL   RBC 4.42 3.87 - 5.11 MIL/uL   Hemoglobin 12.6 12.0 - 15.0 g/dL   HCT 38.1 36.0 - 46.0 %   MCV 86.2 80.0 - 100.0 fL   MCH 28.5 26.0 - 34.0 pg   MCHC 33.1 30.0 - 36.0 g/dL   RDW 13.6 11.5 - 15.5 %   Platelets 295 150 - 400 K/uL   nRBC 0.0 0.0 - 0.2 %   Neutrophils Relative % 92 %   Neutro Abs 10.3 (H) 1.7 - 7.7 K/uL   Lymphocytes  Relative 4 %   Lymphs Abs 0.4 (L) 0.7 - 4.0 K/uL   Monocytes Relative 4 %   Monocytes Absolute 0.5 0.1 - 1.0 K/uL   Eosinophils Relative 0 %   Eosinophils Absolute 0.0 0.0 - 0.5 K/uL   Basophils Relative 0 %   Basophils Absolute 0.0 0.0 - 0.1 K/uL   Immature Granulocytes 0 %   Abs Immature Granulocytes 0.04 0.00 - 0.07 K/uL  Protime-INR     Status: None   Collection Time: 11/24/20  9:07 AM  Result Value Ref Range   Prothrombin Time 13.3 11.4 -  15.2 seconds   INR 1.1 0.8 - 1.2  APTT     Status: Abnormal   Collection Time: 11/24/20  9:07 AM  Result Value Ref Range   aPTT <24 (L) 24 - 36 seconds  Urinalysis, Complete w Microscopic     Status: Abnormal   Collection Time: 11/24/20  9:07 AM  Result Value Ref Range   Color, Urine YELLOW (A) YELLOW   APPearance HAZY (A) CLEAR   Specific Gravity, Urine 1.026 1.005 - 1.030   pH 6.0 5.0 - 8.0   Glucose, UA >=500 (A) NEGATIVE mg/dL   Hgb urine dipstick SMALL (A) NEGATIVE   Bilirubin Urine NEGATIVE NEGATIVE   Ketones, ur 20 (A) NEGATIVE mg/dL   Protein, ur 100 (A) NEGATIVE mg/dL   Nitrite NEGATIVE NEGATIVE   Leukocytes,Ua NEGATIVE NEGATIVE   RBC / HPF 0-5 0 - 5 RBC/hpf   WBC, UA 0-5 0 - 5 WBC/hpf   Bacteria, UA RARE (A) NONE SEEN   Squamous Epithelial / LPF NONE SEEN 0 - 5   Mucus PRESENT    Hyaline Casts, UA PRESENT   POC SARS Coronavirus 2 Ag-ED - Nasal Swab (BD Veritor Kit)     Status: None   Collection Time: 11/24/20 10:43 AM  Result Value Ref Range   SARS Coronavirus 2 Ag NEGATIVE NEGATIVE  Lactic acid, plasma     Status: Abnormal   Collection Time: 11/24/20 11:12 AM  Result Value Ref Range   Lactic Acid, Venous 5.1 (HH) 0.5 - 1.9 mmol/L  SARS Coronavirus 2 by RT PCR (hospital order, performed in [place] hospital lab) Nasopharyngeal Nasopharyngeal Swab     Status: Abnormal   Collection Time: 11/24/20 11:12 AM   Specimen: Nasopharyngeal Swab  Result Value Ref Range   SARS Coronavirus 2 POSITIVE (A) NEGATIVE    ____________________________________________  EKG My review and personal interpretation at Time: 9:00   Indication: ams  Rate: 115  Rhythm: sinus Axis: normal Other: normal intervals, no stemi ____________________________________________  RADIOLOGY  I personally reviewed all radiographic images ordered to evaluate for the above acute complaints and reviewed radiology reports and findings.  These findings were personally discussed with the patient.  Please see medical record for radiology report.  ____________________________________________   PROCEDURES  Procedure(s) performed:  .Critical Care Performed by: Merlyn Lot, MD Authorized by: Merlyn Lot, MD   Critical care provider statement:    Critical care time (minutes):  40   Critical care time was exclusive of:  Separately billable procedures and treating other patients   Critical care was necessary to treat or prevent imminent or life-threatening deterioration of the following conditions:  Sepsis   Critical care was time spent personally by me on the following activities:  Development of treatment plan with patient or surrogate, discussions with consultants, evaluation of patient's response to treatment, examination of patient, obtaining history from patient or surrogate, ordering and performing treatments and interventions, ordering and review of laboratory studies, ordering and review of radiographic studies, pulse oximetry, re-evaluation of patient's condition and review of old charts      Critical Care performed: yes ____________________________________________   INITIAL IMPRESSION / [place] / ED COURSE  Pertinent labs & imaging results that were available during my care of the patient were reviewed by me and considered in my medical decision making (see chart for details).   DDX: Dehydration, sepsis, pna, uti, hypoglycemia, cva, drug effect, withdrawal   Rayah Roscoe is a 87 y.o. who  presents to the  ED with presentation as described above.  Patient frail elderly and ill-appearing but protecting her own airway.  Hemodynamically stable but does have some tachycardia.  Blood work sent for the but differential given presentation concerning for sepsis.  The patient will be placed on continuous pulse oximetry and telemetry for monitoring.  Laboratory evaluation will be sent to evaluate for the above complaints.     Clinical Course as of 11/24/20 1310  Tue Nov 24, 2020  1010 Patient with significant lactic acidosis.  Will give IV fluids.  Given her vague abdominal pain nausea and vomiting will order CTA to evaluate for any evidence of mesenteric ischemia. [PR]  1108 Patient with much better mentation after IV fluid. [PR]  1145 Lactate is improving. [PR]  1310 CTA negative.  Patient is Covid positive.  Unfortunate poor prognosis.  We will continue with IV fluids as well as medical management.  Have ordered remdesivir.  I discussed case in consultation with hospitalist for admission. [PR]    Clinical Course User Index [PR] Merlyn Lot, MD    The patient was evaluated in Emergency Department today for the symptoms described in the history of present illness. He/she was evaluated in the context of the global COVID-19 pandemic, which necessitated consideration that the patient might be at risk for infection with the SARS-CoV-2 virus that causes COVID-19. Institutional protocols and algorithms that pertain to the evaluation of patients at risk for COVID-19 are in a state of rapid change based on information released by regulatory bodies including the CDC and federal and state organizations. These policies and algorithms were followed during the patient's care in the ED.  As part of my medical decision making, I reviewed the following data within the [place] notes reviewed and incorporated, Labs reviewed, notes from prior ED visits and [place] Controlled Substance  Database   ____________________________________________   FINAL CLINICAL IMPRESSION(S) / ED DIAGNOSES  Final diagnoses:  Altered mental status, unspecified altered mental status type  Sepsis with encephalopathy, due to unspecified organism, unspecified whether septic shock present ([place])  COVID-19 virus infection      NEW MEDICATIONS STARTED DURING THIS VISIT:  New Prescriptions   No medications on file     Note:  This document was prepared using Dragon voice recognition software and may include unintentional dictation errors.    Merlyn Lot, MD 11/24/20 1310

## 2020-11-24 NOTE — Progress Notes (Signed)
Notified bedside nurse of need to administer fluid bolus, needs 1536 cc fluid and repeat lactate due.

## 2020-11-24 NOTE — Progress Notes (Signed)
CODE SEPSIS - PHARMACY COMMUNICATION  **Broad Spectrum Antibiotics should be administered within 1 hour of Sepsis diagnosis**  Time Code Sepsis Called/Page Received: 1013  Antibiotics Ordered: cefepime/vancomycin/metronidazole  Time of 1st antibiotic administration: 1023     Pricilla Riffle ,PharmD Clinical Pharmacist  11/24/2020  11:48 AM

## 2020-11-24 NOTE — ED Triage Notes (Addendum)
Pt comes with son with c/o AMS. Per son he got home from work at 0700 this morning and his daughter told him that the pt was vomiting last night and not acting right. Pt normally able to speak and is not communicating at this time.  Pt not able to follow commands.  Son states LKW was 19:30 Monday night. Pt began to not eat and was shaking.

## 2020-11-24 NOTE — ED Notes (Signed)
RN Shanda Bumps at bedside

## 2020-11-24 NOTE — Progress Notes (Signed)
PHARMACY -  BRIEF ANTIBIOTIC NOTE   Pharmacy has received consult(s) for vancomycin and cefepime from an ED provider.  The patient's profile has been reviewed for ht/wt/allergies/indication/available labs.    One time order(s) placed for vanc 1 g + cefepime 2 g  Further antibiotics/pharmacy consults should be ordered by admitting physician if indicated.                       Thank you,  Pricilla Riffle, PharmD 11/24/2020  10:25 AM

## 2020-11-24 NOTE — ED Notes (Signed)
Pt unable to follow commands to wrap lips around straw to drink. Holding PO meds at this time.

## 2020-11-24 NOTE — Progress Notes (Signed)
Remdesivir - Pharmacy Brief Note   O:  ALT: 18 CXR: No acute cardiopulmonary abnormality.   A/P:  Remdesivir 200 mg IVPB once followed by 100 mg IVPB daily x 4 days.   Laureen Ochs, PharmD 11/24/2020 1:19 PM

## 2020-11-24 NOTE — Progress Notes (Signed)
Pharmacy Antibiotic Note  Melanie Turner is a 87 y.o. female admitted on 11/24/2020. Pharmacy has been consulted for vancomycin and cefepime dosing.  Plan: Vancomycin 1000 mg IV x 1 followed by 750 mg IV Q 36 hrs Goal AUC 400-550 Expected AUC: 488 SCr used: 1.0  Cefepime 2 g IV q24h     Temp (24hrs), Avg:99 F (37.2 C), Min:99 F (37.2 C), Max:99 F (37.2 C)  Recent Labs  Lab 11/24/20 0907 11/24/20 1112  WBC 11.2*  --   CREATININE 0.99  --   LATICACIDVEN 9.9* 5.1*    CrCl cannot be calculated (Unknown ideal weight.).    No Known Allergies  Antimicrobials this admission: Vancomycin 1/25 >> Cefepime 1/25 >> Metronidazole 1/25 >>   Microbiology results: 1/25 BCx: pending 1/25 UCx: pending    Thank you for allowing pharmacy to be a part of this patient's care.  Pricilla Riffle, PharmD 11/24/2020 2:10 PM

## 2020-11-24 NOTE — ED Notes (Signed)
Called lab to add on trop 

## 2020-11-24 NOTE — H&P (Signed)
History and Physical   Melanie Turner ZOX:[id number] DOB: 09-Nov-1931 DOA: 11/24/2020  PCP: Lauro RegulusAnderson, Marshall W, MD  Outpatient Specialists: Dr. Mathis BudHernandez-Soria, orthopedic Patient coming from: home  I have personally briefly reviewed patient's old medical records in [facility] EMR.  Chief Concern: poor PO intake, nausea, vomiting  HPI: Melanie DenseClastene Rieger is a 87 y.o. female with medical history significant for non-insulin-dependent diabetes mellitus on Metformin at home, dementia, hypertension, presented to the emergency department for chief concerns of altered mentation per family.  Family states that on 11/23/2020, patient was not behaving like herself including poor p.o. intake and had unknown episodes of nausea and vomiting.  H&P was obtained by son via the phone.  Son did not know how many times his mother vomited the granddaughter to care of patient at night when son is working.  Son did say that patient was not persistently vomiting.  Son denies fever and diarrhea. Son did endorse clear vomitus x 2 with further prompting and poor PO intake for 1 day.  At bedside, patient was able to tell me her name.  She was unable to tell me her age, current location, current year.  This appears to be her baseline as patient has advanced dementia. She was able to follow commands except for the right lower extremity strenght is 2/5 and per family this is baseline.  Vaccination: Son endorses that patient is unvaccinated for COVID-19.  He states that she does not have any exposures and does not leave the house.  However I did talk to son that patient does have exposure and that is with himself, son, and granddaughter.  The entire family is unvaccinated.  ROS: Unable to obtain as patient has dementia  ED Course: Discussed with ED provider, patient requiring hospitalization due to DKA and suspected sepsis presentation.  Patient had a Covid antigen negative however Covid PCR was  positive.  Assessment/Plan  Principal Problem:   DKA (diabetic ketoacidosis) (HCC) Active Problems:   Type II diabetes mellitus with renal manifestations (HCC)   Benign essential HTN   Dementia (HCC)   Closed right hip fracture (HCC)   Hyperglycemia due to type 2 diabetes mellitus (HCC)   Sepsis (HCC)   COVID-19 virus infection   Gastroenteritis due to COVID-19 virus   Sepsis-suspect secondary to COVID-19 infection -Remdesivir IV initiated -Solu-Medrol IV started per COVID-19 order panel -Blood cultures x2 -Broad-spectrum antibiotics pending blood cultures -Patient is maintaining MAP and has received bolus fluid resuscitation per ED provider  DKA-suspect secondary to sepsis in setting of COVID-19 infection -DKA protocol initiated -Aggressive fluid hydration -We will stop insulin GTT pending gap closure -Low clinical suspicion for ACS, EKG was read personally by me and showed sinus tachycardia, rate of 113, QTC 500 -Troponin HS stat once  COVID-19 infection-suspect secondary to exposure to family and patient is not vaccinated Gastroenteritis suspect secondary to COVID-19 infection-treat as above -Remdesivir, Solu-Medrol IV daily -Daily labs: CMP, CBC, CRP, D-dimer  Diabetes mellitus-not on insulin -Patient takes Metformin 1500 daily with dinner, sitagliptin 100 mg daily -These have been placed on hold -SLP has been consulted  Dementia-resumed home donepezil 5 mg daily  Hypertension-takes amlodipine 5 mg daily  Minimal movement of right lower extremity suspect secondary to closed right hip fracture-outpatient follow-up with orthopedic surgeon  Chart reviewed.   DVT prophylaxis: enoxaparin Code Status: dnr, confirmed with son via the phone Diet: npo pending slp evaluation Family Communication: son via phone Disposition Plan: pending clinical course Consults called: dietary, toc Admission status:  observation to step down  Past Medical History:  Diagnosis Date  .  Diabetes mellitus without complication (HCC)   . Hypertension    Past Surgical History:  Procedure Laterality Date  . HIP ARTHROPLASTY Right 01/29/2020   Procedure: ARTHROPLASTY BIPOLAR HIP (HEMIARTHROPLASTY);  Surgeon: Juanell Fairly, MD;  Location: [facility] ORS;  Service: Orthopedics;  Laterality: Right;  . THYROIDECTOMY     Social History:  reports that she has never smoked. She has never used smokeless tobacco. She reports current alcohol use. She reports that she does not use drugs.  No Known Allergies Family History  Problem Relation Age of Onset  . Cancer Brother        not know which type of cancer   Family history: Family history reviewed and not pertinent  Prior to Admission medications   Medication Sig Start Date End Date Taking? Authorizing Provider  amLODipine (NORVASC) 5 MG tablet Take 1 tablet (5 mg total) by mouth daily. 02/05/20   Pieter Partridge, MD  clotrimazole-betamethasone (LOTRISONE) cream Apply 1 application topically 2 (two) times daily.    [provider]  donepezil (ARICEPT) 5 MG tablet Take 5 mg by mouth daily.  06/01/18   [provider]  Infant Care Products (DERMACLOUD EX) Apply 1 application topically 2 (two) times daily. To sacrum and buttocks for nonspecific rash.    [provider]  metFORMIN (GLUCOPHAGE) 500 MG tablet Take 1 tablet (500 mg total) by mouth 2 (two) times daily with a meal for 30 days. 03/14/19 02/06/20  Amin, Loura Halt, MD  mirtazapine (REMERON) 7.5 MG tablet Take 7.5 mg by mouth daily.  08/31/18   [provider]   Physical Exam: Vitals:   11/24/20 1155 11/24/20 1230 11/24/20 1245 11/24/20 1300  BP:  (!) 188/96  (!) 192/113  Pulse: 98 97 93 98  Resp:      Temp:      SpO2: 98% 96% 96% 100%   Constitutional: appears age-appropriate, NAD, calm, comfortable Eyes: PERRL, lids and conjunctivae normal ENMT: Mucous membranes are moist. Posterior pharynx clear of any exudate or lesions.  Age-appropriate dentition. Hearing appropriate Neck: normal, supple, no masses, no thyromegaly Respiratory: clear to auscultation bilaterally, no wheezing, no crackles. Normal respiratory effort. No accessory muscle use.  Cardiovascular: Regular rate and rhythm, no murmurs / rubs / gallops. No extremity edema. 2+ pedal pulses. No carotid bruits.  Abdomen: no tenderness, no masses palpated, no hepatosplenomegaly. Bowel sounds positive.  Musculoskeletal: no clubbing / cyanosis. No joint deformity upper and lower extremities. Good ROM, no contractures, no atrophy. Normal muscle tone.  Skin: no rashes, lesions, ulcers. No induration Neurologic: Sensation intact. Strength 5/5 in bilateral upper extremities and left lower extremity.  Strength is 2 out of 5 for right lower extremity.  This appears to be baseline. Psychiatric: Patient is awake alert and oriented to self only.  She is pleasant and able to follow commands.  EKG: independently reviewed, showing sinus tachycardia, rate of 113, QTC 500  Chest x-ray on Admission: I personally reviewed and I agree with radiologist reading as below.  DG Chest Port 1 View  Result Date: 11/24/2020 CLINICAL DATA:  87 year old female with possible sepsis. Altered mental status. EXAM: PORTABLE CHEST 1 VIEW COMPARISON:  Portable chest 10/10/2020 and earlier. FINDINGS: Portable AP upright view at 0912 hours. Lung volumes and mediastinal contours are stable and within normal limits. Calcified aortic atherosclerosis. Visualized tracheal air column is within normal limits. Allowing for portable technique the lungs are clear.  No pneumothorax or pleural effusion. No acute osseous abnormality identified. Paucity of bowel gas in the upper abdomen. IMPRESSION: No acute cardiopulmonary abnormality. Electronically Signed   By: Odessa Fleming M.D.   On: 11/24/2020 09:25   CT Angio Abd/Pel W and/or Wo Contrast  Result Date: 11/24/2020 CLINICAL DATA:  Mental status changes and vomiting.  Elevated lactate and possible mesenteric ischemia versus sepsis. EXAM: CT ANGIOGRAPHY ABDOMEN AND PELVIS WITH CONTRAST TECHNIQUE: Multidetector CT imaging of the abdomen and pelvis was performed using the standard protocol during bolus administration of intravenous contrast. Multiplanar reconstructed images and MIPs were obtained and reviewed to evaluate the vascular anatomy. CONTRAST:  45mL OMNIPAQUE IOHEXOL 350 MG/ML SOLN COMPARISON:  Prior CT of the abdomen and pelvis with contrast on 11/13/2008 FINDINGS: VASCULAR Aorta: Mild atherosclerosis without evidence of aneurysm, dissection or stenosis. Celiac: Mild calcified plaque at the origin of the celiac axis without stenosis. Distal branches are normally patent and demonstrate normal branching anatomy. SMA: Mild calcified plaque at the SMA origin without evidence of stenosis. Visualized distal branches demonstrate normal patency. Renals: Normally patent bilateral single renal arteries. IMA: Normally patent. Inflow: Normally patent bilateral iliac arteries. Proximal Outflow: Normally patent bilateral common femoral arteries and femoral bifurcations. Veins: Venous phase imaging demonstrates normal patency of visualized mesenteric veins, portal vein, splenic vein, renal veins, IVC, iliac veins and common femoral veins. Review of the MIP images confirms the above findings. NON-VASCULAR Lower chest: No acute abnormality.  Bibasilar atelectasis/scarring. Hepatobiliary: No focal liver abnormality is seen. No gallstones, gallbladder wall thickening, or biliary dilatation. Pancreas: Unremarkable. No pancreatic ductal dilatation or surrounding inflammatory changes. Spleen: Normal in size without focal abnormality. Adrenals/Urinary Tract: Adrenal glands are unremarkable. Kidneys are normal, without renal calculi, focal lesion, or hydronephrosis. Bladder is unremarkable. Stomach/Bowel: Visualized bowel demonstrates no evidence of obstruction, ileus, inflammation or lesion. No  free air identified. Lymphatic: No enlarged abdominal or pelvic lymph nodes. Reproductive: Prostate is unremarkable. Other: No ascites or focal abscess.  No hernias identified. Musculoskeletal: No acute or significant osseous findings. IMPRESSION: 1. No evidence of significant mesenteric arterial occlusive disease. 2. No acute findings in the abdomen or pelvis. 3. Mild atherosclerosis of the abdominal aorta. Aortic Atherosclerosis (ICD10-I70.0). Electronically Signed   By: Irish Lack M.D.   On: 11/24/2020 12:14   Labs on Admission: I have personally reviewed following labs  CBC: Recent Labs  Lab 11/24/20 0907  WBC 11.2*  NEUTROABS 10.3*  HGB 12.6  HCT 38.1  MCV 86.2  PLT 295   Basic Metabolic Panel: Recent Labs  Lab 11/24/20 0907 11/24/20 1419  NA 129* 128*  K 3.5 3.5  CL 87* 90*  CO2 19* 23  GLUCOSE 501* 306*  BUN 14 14  CREATININE 0.99 0.61  CALCIUM 9.1 8.6*   Liver Function Tests: Recent Labs  Lab 11/24/20 0907  AST 39  ALT 18  ALKPHOS 82  BILITOT 1.1  PROT 8.5*  ALBUMIN 4.1   Coagulation Profile: Recent Labs  Lab 11/24/20 0907  INR 1.1   CBG: Recent Labs  Lab 11/24/20 0856 11/24/20 1321 11/24/20 1444  GLUCAP 457* 226* 261*   Thyroid Function Tests: Recent Labs    11/24/20 0907  TSH 1.243   Urine analysis:    Component Value Date/Time   COLORURINE YELLOW (A) 11/24/2020 0907   APPEARANCEUR HAZY (A) 11/24/2020 0907   APPEARANCEUR Clear 06/30/2014 1501   LABSPEC 1.026 11/24/2020 0907   LABSPEC 1.011 06/30/2014 1501   PHURINE 6.0 11/24/2020 6384  GLUCOSEU >=500 (A) 11/24/2020 0907   GLUCOSEU Negative 06/30/2014 1501   HGBUR SMALL (A) 11/24/2020 0907   BILIRUBINUR NEGATIVE 11/24/2020 0907   BILIRUBINUR Negative 06/30/2014 1501   KETONESUR 20 (A) 11/24/2020 0907   PROTEINUR 100 (A) 11/24/2020 0907   NITRITE NEGATIVE 11/24/2020 0907   LEUKOCYTESUR NEGATIVE 11/24/2020 0907   LEUKOCYTESUR Trace 06/30/2014 1501   Johneisha Broaden N Kristalynn Coddington D.O. [facility]  If 7PM-7AM, please contact overnight-coverage provider If 7AM-7PM, please contact day coverage provider [email or web address]  11/24/2020, 3:08 PM

## 2020-11-24 NOTE — ED Notes (Signed)
Hospitalist at bedside 

## 2020-11-25 DIAGNOSIS — A0839 Other viral enteritis: Secondary | ICD-10-CM | POA: Diagnosis not present

## 2020-11-25 DIAGNOSIS — J1282 Pneumonia due to coronavirus disease 2019: Secondary | ICD-10-CM | POA: Diagnosis present

## 2020-11-25 DIAGNOSIS — G309 Alzheimer's disease, unspecified: Secondary | ICD-10-CM | POA: Diagnosis not present

## 2020-11-25 DIAGNOSIS — A4189 Other specified sepsis: Secondary | ICD-10-CM | POA: Diagnosis present

## 2020-11-25 DIAGNOSIS — G9340 Encephalopathy, unspecified: Secondary | ICD-10-CM

## 2020-11-25 DIAGNOSIS — M25851 Other specified joint disorders, right hip: Secondary | ICD-10-CM | POA: Diagnosis present

## 2020-11-25 DIAGNOSIS — S72001S Fracture of unspecified part of neck of right femur, sequela: Secondary | ICD-10-CM | POA: Diagnosis not present

## 2020-11-25 DIAGNOSIS — G9341 Metabolic encephalopathy: Secondary | ICD-10-CM | POA: Diagnosis present

## 2020-11-25 DIAGNOSIS — R6520 Severe sepsis without septic shock: Secondary | ICD-10-CM | POA: Diagnosis not present

## 2020-11-25 DIAGNOSIS — U071 COVID-19: Secondary | ICD-10-CM | POA: Diagnosis present

## 2020-11-25 DIAGNOSIS — I10 Essential (primary) hypertension: Secondary | ICD-10-CM | POA: Diagnosis not present

## 2020-11-25 DIAGNOSIS — Z96641 Presence of right artificial hip joint: Secondary | ICD-10-CM | POA: Diagnosis present

## 2020-11-25 DIAGNOSIS — Z79899 Other long term (current) drug therapy: Secondary | ICD-10-CM | POA: Diagnosis not present

## 2020-11-25 DIAGNOSIS — Z66 Do not resuscitate: Secondary | ICD-10-CM | POA: Diagnosis present

## 2020-11-25 DIAGNOSIS — F0390 Unspecified dementia without behavioral disturbance: Secondary | ICD-10-CM | POA: Diagnosis present

## 2020-11-25 DIAGNOSIS — F0280 Dementia in other diseases classified elsewhere without behavioral disturbance: Secondary | ICD-10-CM | POA: Diagnosis not present

## 2020-11-25 DIAGNOSIS — R4182 Altered mental status, unspecified: Secondary | ICD-10-CM | POA: Diagnosis not present

## 2020-11-25 DIAGNOSIS — E11649 Type 2 diabetes mellitus with hypoglycemia without coma: Secondary | ICD-10-CM | POA: Diagnosis not present

## 2020-11-25 DIAGNOSIS — E1110 Type 2 diabetes mellitus with ketoacidosis without coma: Secondary | ICD-10-CM | POA: Diagnosis present

## 2020-11-25 DIAGNOSIS — A419 Sepsis, unspecified organism: Secondary | ICD-10-CM | POA: Diagnosis present

## 2020-11-25 DIAGNOSIS — Z7984 Long term (current) use of oral hypoglycemic drugs: Secondary | ICD-10-CM | POA: Diagnosis not present

## 2020-11-25 LAB — CBC WITH DIFFERENTIAL/PLATELET
Abs Immature Granulocytes: 0.1 K/uL — ABNORMAL HIGH (ref 0.00–0.07)
Basophils Absolute: 0 K/uL (ref 0.0–0.1)
Basophils Relative: 0 %
Eosinophils Absolute: 0 K/uL (ref 0.0–0.5)
Eosinophils Relative: 0 %
HCT: 35.4 % — ABNORMAL LOW (ref 36.0–46.0)
Hemoglobin: 11.8 g/dL — ABNORMAL LOW (ref 12.0–15.0)
Immature Granulocytes: 1 %
Lymphocytes Relative: 11 %
Lymphs Abs: 1.7 K/uL (ref 0.7–4.0)
MCH: 28.8 pg (ref 26.0–34.0)
MCHC: 33.3 g/dL (ref 30.0–36.0)
MCV: 86.3 fL (ref 80.0–100.0)
Monocytes Absolute: 0.6 K/uL (ref 0.1–1.0)
Monocytes Relative: 4 %
Neutro Abs: 13.3 K/uL — ABNORMAL HIGH (ref 1.7–7.7)
Neutrophils Relative %: 84 %
Platelets: 234 K/uL (ref 150–400)
RBC: 4.1 MIL/uL (ref 3.87–5.11)
RDW: 14.2 % (ref 11.5–15.5)
WBC: 15.7 K/uL — ABNORMAL HIGH (ref 4.0–10.5)
nRBC: 0 % (ref 0.0–0.2)

## 2020-11-25 LAB — PROTIME-INR
INR: 1.3 — ABNORMAL HIGH (ref 0.8–1.2)
Prothrombin Time: 15.6 seconds — ABNORMAL HIGH (ref 11.4–15.2)

## 2020-11-25 LAB — FIBRIN DERIVATIVES D-DIMER (ARMC ONLY): Fibrin derivatives D-dimer (ARMC): 1071.05 ng/mL (FEU) — ABNORMAL HIGH (ref 0.00–499.00)

## 2020-11-25 LAB — BASIC METABOLIC PANEL
Anion gap: 11 (ref 5–15)
BUN: 12 mg/dL (ref 8–23)
CO2: 27 mmol/L (ref 22–32)
Calcium: 8.9 mg/dL (ref 8.9–10.3)
Chloride: 97 mmol/L — ABNORMAL LOW (ref 98–111)
Creatinine, Ser: 0.52 mg/dL (ref 0.44–1.00)
GFR, Estimated: >60 mL/min (ref >60)
Glucose, Bld: 153 mg/dL — ABNORMAL HIGH (ref 70–99)
Potassium: 3.5 mmol/L (ref 3.5–5.1)
Sodium: 135 mmol/L (ref 135–145)

## 2020-11-25 LAB — CBG MONITORING, ED
Glucose-Capillary: 151 mg/dL — ABNORMAL HIGH (ref 70–99)
Glucose-Capillary: 166 mg/dL — ABNORMAL HIGH (ref 70–99)
Glucose-Capillary: 181 mg/dL — ABNORMAL HIGH (ref 70–99)
Glucose-Capillary: 205 mg/dL — ABNORMAL HIGH (ref 70–99)
Glucose-Capillary: 205 mg/dL — ABNORMAL HIGH (ref 70–99)
Glucose-Capillary: 206 mg/dL — ABNORMAL HIGH (ref 70–99)
Glucose-Capillary: 207 mg/dL — ABNORMAL HIGH (ref 70–99)
Glucose-Capillary: 223 mg/dL — ABNORMAL HIGH (ref 70–99)
Glucose-Capillary: 153 mg/dL — ABNORMAL HIGH (ref 70–99)
Glucose-Capillary: 111 mg/dL — ABNORMAL HIGH (ref 70–99)
Glucose-Capillary: 135 mg/dL — ABNORMAL HIGH (ref 70–99)
Glucose-Capillary: 133 mg/dL — ABNORMAL HIGH (ref 70–99)

## 2020-11-25 LAB — C-REACTIVE PROTEIN: CRP: 11.7 mg/dL — ABNORMAL HIGH (ref <1.0)

## 2020-11-25 LAB — GLUCOSE, CAPILLARY: Glucose-Capillary: 82 mg/dL (ref 70–99)

## 2020-11-25 LAB — URINE CULTURE: Culture: NO GROWTH

## 2020-11-25 LAB — LACTIC ACID, PLASMA: Lactic Acid, Venous: 1.7 mmol/L (ref 0.5–1.9)

## 2020-11-25 LAB — PROCALCITONIN: Procalcitonin: 3.84 ng/mL

## 2020-11-25 NOTE — ED Notes (Signed)
Peri-care completed, purewick and pad changed.

## 2020-11-25 NOTE — Progress Notes (Signed)
[facility]  - [place] [facility]   [name]    MR#:  [id number]  DATE OF BIRTH:  January 24, 1932  SUBJECTIVE:  seen in the ER. Communicating some. Poor historian. No family. Was brought in with altered mental status some vomiting. Found to have COVID positive pneumonia. No respiratory distress.  REVIEW OF SYSTEMS:   Review of Systems  Unable to perform ROS: Mental status change   Tolerating Diet: Tolerating PT:   DRUG ALLERGIES:  No Known Allergies  VITALS:  Blood pressure (!) 169/111, pulse 80, temperature 99 F (37.2 C), resp. rate 18, weight 52 kg, SpO2 100 %.  PHYSICAL EXAMINATION:   Physical Exam  GENERAL:  87 y.o.-year-old patient lying in the bed with no acute distress.  LUNGS: Normal breath sounds bilaterally, no wheezing, rales, rhonchi. No use of accessory muscles of respiration.  CARDIOVASCULAR: S1, S2 normal. No murmurs, rubs, or gallops.  ABDOMEN: Soft, nontender, nondistended. Bowel sounds present. No organomegaly or mass.  EXTREMITIES: No cyanosis, clubbing or edema b/l.    NEUROLOGIC: moves all extremities spontaneously. Unable to do detail exam secondary to dementia. PSYCHIATRIC:  patient is alert and confused at baseline. SKIN: No obvious rash, lesion, or ulcer.   LABORATORY PANEL:  CBC Recent Labs  Lab 11/25/20 0430  WBC 15.7*  HGB 11.8*  HCT 35.4*  PLT 234    Chemistries  Recent Labs  Lab 11/24/20 0907 11/24/20 1419 11/25/20 0431  NA 129*   < > 135  K 3.5   < > 3.5  CL 87*   < > 97*  CO2 19*   < > 27  GLUCOSE 501*   < > 153*  BUN 14   < > 12  CREATININE 0.99   < > 0.52  CALCIUM 9.1   < > 8.9  AST 39  --   --   ALT 18  --   --   ALKPHOS 82  --   --   BILITOT 1.1  --   --    < > = values in this interval not displayed.   Cardiac Enzymes No results for input(s): TROPONINI in the last 168 hours. RADIOLOGY:  DG Chest Port 1 View  Result Date: 11/24/2020 CLINICAL DATA:  87 year old female with  possible sepsis. Altered mental status. EXAM: PORTABLE CHEST 1 VIEW COMPARISON:  Portable chest 10/10/2020 and earlier. FINDINGS: Portable AP upright view at 0912 hours. Lung volumes and mediastinal contours are stable and within normal limits. Calcified aortic atherosclerosis. Visualized tracheal air column is within normal limits. Allowing for portable technique the lungs are clear. No pneumothorax or pleural effusion. No acute osseous abnormality identified. Paucity of bowel gas in the upper abdomen. IMPRESSION: No acute cardiopulmonary abnormality. Electronically Signed   By: Odessa Fleming M.D.   On: 11/24/2020 09:25   CT Angio Abd/Pel W and/or Wo Contrast  Result Date: 11/24/2020 CLINICAL DATA:  Mental status changes and vomiting. Elevated lactate and possible mesenteric ischemia versus sepsis. EXAM: CT ANGIOGRAPHY ABDOMEN AND PELVIS WITH CONTRAST TECHNIQUE: Multidetector CT imaging of the abdomen and pelvis was performed using the standard protocol during bolus administration of intravenous contrast. Multiplanar reconstructed images and MIPs were obtained and reviewed to evaluate the vascular anatomy. CONTRAST:  51mL OMNIPAQUE IOHEXOL 350 MG/ML SOLN COMPARISON:  Prior CT of the abdomen and pelvis with contrast on 11/13/2008 FINDINGS: VASCULAR Aorta: Mild atherosclerosis without evidence of aneurysm, dissection or stenosis. Celiac: Mild calcified plaque at the origin of the  celiac axis without stenosis. Distal branches are normally patent and demonstrate normal branching anatomy. SMA: Mild calcified plaque at the SMA origin without evidence of stenosis. Visualized distal branches demonstrate normal patency. Renals: Normally patent bilateral single renal arteries. IMA: Normally patent. Inflow: Normally patent bilateral iliac arteries. Proximal Outflow: Normally patent bilateral common femoral arteries and femoral bifurcations. Veins: Venous phase imaging demonstrates normal patency of visualized mesenteric veins,  portal vein, splenic vein, renal veins, IVC, iliac veins and common femoral veins. Review of the MIP images confirms the above findings. NON-VASCULAR Lower chest: No acute abnormality.  Bibasilar atelectasis/scarring. Hepatobiliary: No focal liver abnormality is seen. No gallstones, gallbladder wall thickening, or biliary dilatation. Pancreas: Unremarkable. No pancreatic ductal dilatation or surrounding inflammatory changes. Spleen: Normal in size without focal abnormality. Adrenals/Urinary Tract: Adrenal glands are unremarkable. Kidneys are normal, without renal calculi, focal lesion, or hydronephrosis. Bladder is unremarkable. Stomach/Bowel: Visualized bowel demonstrates no evidence of obstruction, ileus, inflammation or lesion. No free air identified. Lymphatic: No enlarged abdominal or pelvic lymph nodes. Reproductive: Prostate is unremarkable. Other: No ascites or focal abscess.  No hernias identified. Musculoskeletal: No acute or significant osseous findings. IMPRESSION: 1. No evidence of significant mesenteric arterial occlusive disease. 2. No acute findings in the abdomen or pelvis. 3. Mild atherosclerosis of the abdominal aorta. Aortic Atherosclerosis (ICD10-I70.0). Electronically Signed   By: Irish Lack M.D.   On: 11/24/2020 12:14   ASSESSMENT AND PLAN:  Melanie Turner is a 87 y.o. female with medical history significant for non-insulin-dependent diabetes mellitus on Metformin at home, dementia, hypertension, presented to the emergency department for chief concerns of altered mentation per family. Family states that on 11/23/2020, patient was not behaving like herself including poor p.o. intake and had unknown episodes of nausea and vomiting.  Sepsis-suspect secondary to COVID-19 infection -Remdesivir IV initiated -Solu-Medrol IV started per COVID-19 order panel -Blood cultures x2--so far negative -Broad-spectrum antibiotics narrowed down to IV cefepime. Will DC antibiotics if no source  identified. -Patient received IV fluids per sepsis protocol. Lactic acid down to 9.9--5.1-- 1.7  type II diabetes uncontrolled/hyperglycemia -suspect secondary to sepsis in setting of COVID-19 infection -DKA protocol initiated-- however resolved. I believe patient was not on insulin drip. -Received aggressive fluid hydration ---Patient takes Metformin 1500 daily with dinner, sitagliptin 100 mg daily  COVID-19 infection-suspect secondary to exposure to family and patient is not vaccinated  Gastroenteritis suspect secondary to COVID-19 infection-treat as above -Remdesivir, Solu-Medrol IV daily -Daily labs: CMP, CBC, CRP, D-dimer  acute metabolic encephalopathy in the setting of chronic advance dementia -resumed home donepezil 5 mg daily -- patient's mentation is little better today. She is communicating. Poor PO intake.  Hypertension-takes amlodipine 5 mg daily  Minimal movement of right lower extremity suspect secondary to closed right hip fracture-outpatient follow-up with orthopedic surgeon  DVT prophylaxis: enoxaparin Code Status: dnr, confirmed with son via the phone Diet: npo pending slp evaluation Family Communication: son via phone Disposition Plan: pending clinical course Consults called: dietary, toc Level of care: Med-Surg Status is: Inpatient               Patient currently is not medically stable to d/c.   Difficult to place patient No        TOTAL TIME TAKING CARE OF THIS PATIENT: 25 minutes.  >50% time spent on counselling and coordination of care  Note: This dictation was prepared with Dragon dictation along with smaller phrase technology. Any transcriptional errors that result from this process are unintentional.  [name] M.D    [facility]   CC: Primary care physician; Lauro Regulus, MDPatient ID: Sharlette Dense, female   DOB: [birth year], 87 y.o.   MRN: [id number]

## 2020-11-25 NOTE — ED Notes (Signed)
Patient refused blood draw from phlebotomy, MD notified. Lactic order discontinued. Patients last lactic acid level was within normal limits.

## 2020-11-25 NOTE — Evaluation (Signed)
Clinical/Bedside Swallow Evaluation Patient Details  Name: Melanie Turner MRN: [id number] Date of Birth: 1932-06-09  Today's Date: 11/25/2020 Time: SLP Start Time (ACUTE ONLY): 1000 SLP Stop Time (ACUTE ONLY): 1020 SLP Time Calculation (min) (ACUTE ONLY): 20 min  Past Medical History:  Past Medical History:  Diagnosis Date  . Diabetes mellitus without complication (HCC)   . Hypertension    Past Surgical History:  Past Surgical History:  Procedure Laterality Date  . HIP ARTHROPLASTY Right 01/29/2020   Procedure: ARTHROPLASTY BIPOLAR HIP (HEMIARTHROPLASTY);  Surgeon: Juanell Fairly, MD;  Location: [facility] ORS;  Service: Orthopedics;  Laterality: Right;  . THYROIDECTOMY     HPI:  Melanie Turner is a 87 y.o. female with medical history significant for non-insulin-dependent diabetes mellitus on Metformin at home, dementia, hypertension, presented to the emergency department for chief concerns of altered mentation per family. CT abdomen was negative for mesenteric arterial occlusive disease, no acute findins in the abdomen or pelvis, mild atherosclerosis of the abdominal aorta.  Chest x-ray negative.   Assessment / Plan / Recommendation Clinical Impression  Pt presents with mild risk of aspiration d/t advanced dementia and inability to follow directions. During this evaluation, pt was awake/alert, oriented to self only and pleasantly interacted with this Clinical research associate. When consuming puree, pt orally accepted half of the bolus on spoon but demonstrated grossly functional oral abilities albeit slowly. Given slightly increased oral phase with puree and pt's decreased attention, soft solids were note attempted. When consuming thin liquids via straw, pt paced her self with slow small sips. Her pharyngeal swallow appeared swift with no overt s/s of aspiration. At this time, recommend dysphagia 1 diet with thin liquids, medicine crushed in puree. Pt will require assistance with self-feeding d/t mentation  deficits. At this time, skilled ST intervention is not indicated given advanced cognitive impairments. ST to sign off. SLP Visit Diagnosis: Dysphagia, oral phase (R13.11)    Aspiration Risk  Mild aspiration risk    Diet Recommendation Dysphagia 1 (Puree);Thin liquid   Liquid Administration via: Straw Medication Administration: Crushed with puree Supervision: Staff to assist with self feeding;Full supervision/cueing for compensatory strategies Compensations: Minimize environmental distractions;Slow rate;Small sips/bites Postural Changes: Seated upright at 90 degrees    Other  Recommendations Oral Care Recommendations: Oral care BID   Follow up Recommendations None      Frequency and Duration            Prognosis Prognosis for Safe Diet Advancement:  (poor d/t advanced dementia)      Swallow Study   General Date of Onset: 11/24/20 HPI: Melanie Turner is a 87 y.o. female with medical history significant for non-insulin-dependent diabetes mellitus on Metformin at home, dementia, hypertension, presented to the emergency department for chief concerns of altered mentation per family. CT abdomen was negative for mesenteric arterial occlusive disease, no acute findins in the abdomen or pelvis, mild atherosclerosis of the abdominal aorta.  Chest x-ray negative. Type of Study: Bedside Swallow Evaluation Previous Swallow Assessment: none in chart Diet Prior to this Study: NPO Temperature Spikes Noted: No Respiratory Status: Room air History of Recent Intubation: No Behavior/Cognition: Alert;Pleasant mood;Confused Oral Cavity Assessment: Within Functional Limits Oral Care Completed by SLP: Yes Oral Cavity - Dentition: Adequate natural dentition Vision:  (d/t AMS) Self-Feeding Abilities: Total assist Patient Positioning: Upright in bed Baseline Vocal Quality: Normal Volitional Cough: Cognitively unable to elicit Volitional Swallow: Unable to elicit    Oral/Motor/Sensory Function  Overall Oral Motor/Sensory Function: Generalized oral weakness   Ice Chips  Thin Liquid Thin Liquid: Within functional limits Presentation: Straw (able to hold cup with assistance from SLP)    Nectar Thick Nectar Thick Liquid: Not tested   Honey Thick Honey Thick Liquid: Not tested   Puree Puree: Impaired Presentation: Spoon Oral Phase Impairments: Reduced lingual movement/coordination Other Comments: when offered spoon, pt accepted ~ half spoon of food   Solid     Solid: Not tested     Abbegail Matuska B. Dreama Saa M.S., CCC-SLP, [facility] Speech-Language Pathologist Rehabilitation Services Office [phone]  Palmer Fahrner Dreama Saa 11/25/2020,1:46 PM

## 2020-11-25 NOTE — ED Notes (Signed)
Assisted pt with meal tray

## 2020-11-25 NOTE — Progress Notes (Signed)
Inpatient Diabetes Program Recommendations  AACE/ADA: New Consensus Statement on Inpatient Glycemic Control (2015)  Target Ranges:  Prepandial:   less than 140 mg/dL      Peak postprandial:   less than 180 mg/dL (1-2 hours)      Critically ill patients:  140 - 180 mg/dL   Lab Results  Component Value Date   GLUCAP 111 (H) 11/25/2020   HGBA1C 10.1 (H) 10/09/2020   Diabetes history: DM 2 Outpatient Diabetes medications:  Metformin 500 mg bid Current orders for Inpatient glycemic control:  Novolog moderate tid with meals and HS, Levemir 16 units daily, Novolog 3 units tid with meals Inpatient Diabetes Program Recommendations:    Agree with current orders.  Patient may need insulin at discharge.  If she goes back home, will need to teach family/caregiver how to inject insulin. Will follow.    Thanks,  Beryl Meager, RN, BC-ADM Inpatient Diabetes Coordinator Pager [phone] (8a-5p)

## 2020-11-25 NOTE — Plan of Care (Signed)
  Problem: Education: Goal: Knowledge of risk factors and measures for prevention of condition will improve Outcome: Progressing   Problem: Coping: Goal: Psychosocial and spiritual needs will be supported Outcome: Progressing   Problem: Respiratory: Goal: Will maintain a patent airway Outcome: Progressing Goal: Complications related to the disease process, condition or treatment will be avoided or minimized Outcome: Progressing   

## 2020-11-25 NOTE — ED Notes (Signed)
Lab tried to draw lactic acid, pt refused to have blood drawn. MD hospitalist made aware .

## 2020-11-26 DIAGNOSIS — G309 Alzheimer's disease, unspecified: Secondary | ICD-10-CM | POA: Diagnosis not present

## 2020-11-26 DIAGNOSIS — A419 Sepsis, unspecified organism: Secondary | ICD-10-CM | POA: Diagnosis not present

## 2020-11-26 DIAGNOSIS — G9341 Metabolic encephalopathy: Secondary | ICD-10-CM | POA: Diagnosis not present

## 2020-11-26 DIAGNOSIS — U071 COVID-19: Secondary | ICD-10-CM | POA: Diagnosis not present

## 2020-11-26 LAB — GLUCOSE, CAPILLARY
Glucose-Capillary: 39 mg/dL — CL (ref 70–99)
Glucose-Capillary: 107 mg/dL — ABNORMAL HIGH (ref 70–99)
Glucose-Capillary: 121 mg/dL — ABNORMAL HIGH (ref 70–99)
Glucose-Capillary: 125 mg/dL — ABNORMAL HIGH (ref 70–99)
Glucose-Capillary: 235 mg/dL — ABNORMAL HIGH (ref 70–99)
Glucose-Capillary: 270 mg/dL — ABNORMAL HIGH (ref 70–99)
Glucose-Capillary: 137 mg/dL — ABNORMAL HIGH (ref 70–99)

## 2020-11-26 LAB — CBC WITH DIFFERENTIAL/PLATELET
Abs Immature Granulocytes: 0.05 10*3/uL (ref 0.00–0.07)
Basophils Absolute: 0 10*3/uL (ref 0.0–0.1)
Basophils Relative: 0 %
Eosinophils Absolute: 0 10*3/uL (ref 0.0–0.5)
Eosinophils Relative: 0 %
HCT: 33.2 % — ABNORMAL LOW (ref 36.0–46.0)
Hemoglobin: 11.2 g/dL — ABNORMAL LOW (ref 12.0–15.0)
Immature Granulocytes: 0 %
Lymphocytes Relative: 19 %
Lymphs Abs: 2.6 10*3/uL (ref 0.7–4.0)
MCH: 28.9 pg (ref 26.0–34.0)
MCHC: 33.7 g/dL (ref 30.0–36.0)
MCV: 85.8 fL (ref 80.0–100.0)
Monocytes Absolute: 0.6 10*3/uL (ref 0.1–1.0)
Monocytes Relative: 5 %
Neutro Abs: 10.2 10*3/uL — ABNORMAL HIGH (ref 1.7–7.7)
Neutrophils Relative %: 76 %
Platelets: 265 10*3/uL (ref 150–400)
RBC: 3.87 MIL/uL (ref 3.87–5.11)
RDW: 14.2 % (ref 11.5–15.5)
WBC: 13.5 10*3/uL — ABNORMAL HIGH (ref 4.0–10.5)
nRBC: 0 % (ref 0.0–0.2)

## 2020-11-26 LAB — BASIC METABOLIC PANEL
Anion gap: 10 (ref 5–15)
BUN: 16 mg/dL (ref 8–23)
CO2: 30 mmol/L (ref 22–32)
Calcium: 8.5 mg/dL — ABNORMAL LOW (ref 8.9–10.3)
Chloride: 96 mmol/L — ABNORMAL LOW (ref 98–111)
Creatinine, Ser: 0.44 mg/dL (ref 0.44–1.00)
GFR, Estimated: >60 mL/min (ref >60)
Glucose, Bld: 42 mg/dL — CL (ref 70–99)
Potassium: 2.5 mmol/L — CL (ref 3.5–5.1)
Sodium: 136 mmol/L (ref 135–145)

## 2020-11-26 LAB — C-REACTIVE PROTEIN: CRP: 6.2 mg/dL — ABNORMAL HIGH (ref <1.0)

## 2020-11-26 LAB — HEMOGLOBIN A1C
Hgb A1c MFr Bld: 10.5 % — ABNORMAL HIGH (ref 4.8–5.6)
Mean Plasma Glucose: 254.65 mg/dL

## 2020-11-26 LAB — MAGNESIUM: Magnesium: 1.9 mg/dL (ref 1.7–2.4)

## 2020-11-26 LAB — PROCALCITONIN: Procalcitonin: 3.03 ng/mL

## 2020-11-26 LAB — FIBRIN DERIVATIVES D-DIMER (ARMC ONLY): Fibrin derivatives D-dimer (ARMC): 780.09 ng/mL (FEU) — ABNORMAL HIGH (ref 0.00–499.00)

## 2020-11-26 MED ORDER — POTASSIUM CHLORIDE 10 MEQ/100ML IV SOLN
10.0000 meq | INTRAVENOUS | Status: AC
  Administered 2020-11-26 (×3): 10 meq via INTRAVENOUS
  Filled 2020-11-26 (×3): qty 100

## 2020-11-26 MED ORDER — SODIUM CHLORIDE 0.9 % IV SOLN
100.0000 mg | Freq: Every day | INTRAVENOUS | Status: DC
  Administered 2020-11-26: 13:00:00 100 mg via INTRAVENOUS
  Filled 2020-11-26: qty 20

## 2020-11-26 MED ORDER — PREDNISONE 50 MG PO TABS
50.0000 mg | ORAL_TABLET | Freq: Every day | ORAL | Status: DC
  Administered 2020-11-27: 50 mg via ORAL
  Filled 2020-11-26: qty 1

## 2020-11-26 MED ORDER — POTASSIUM CHLORIDE 20 MEQ PO PACK
40.0000 meq | PACK | Freq: Once | ORAL | Status: AC
  Administered 2020-11-26: 07:00:00 40 meq via ORAL

## 2020-11-26 MED ORDER — DEXTROSE 5 % IV SOLN: INTRAVENOUS | Status: DC

## 2020-11-26 MED ORDER — HYDRALAZINE HCL 50 MG PO TABS
50.0000 mg | ORAL_TABLET | Freq: Four times a day (QID) | ORAL | Status: DC | PRN
  Administered 2020-11-26 – 2020-11-27 (×2): 50 mg via ORAL
  Filled 2020-11-26 (×2): qty 1

## 2020-11-26 MED ORDER — POTASSIUM CHLORIDE 20 MEQ PO PACK
40.0000 meq | PACK | Freq: Once | ORAL | Status: DC
  Filled 2020-11-26: qty 2

## 2020-11-26 NOTE — Progress Notes (Signed)
MD notified of all critical labs. New orders have been initiated.

## 2020-11-26 NOTE — Consult Note (Signed)
PHARMACY CONSULT NOTE - FOLLOW UP  Pharmacy Consult for Potassium Monitoring and Replacement   Recent Labs: Potassium (mmol/L)  Date Value  11/26/2020 2.5 (LL)  06/30/2014 4.1   Magnesium (mg/dL)  Date Value  37/07/6268 1.9   Calcium (mg/dL)  Date Value  48/54/6270 8.5 (L)   Calcium, Total (mg/dL)  Date Value  35/00/9381 9.1   Albumin (g/dL)  Date Value  82/99/3716 4.1   Phosphorus (mg/dL)  Date Value  96/78/9381 2.3 (L)   Sodium (mmol/L)  Date Value  11/26/2020 136  06/30/2014 139     Assessment: 87 yo female with PMH sig for NIDDM, dementia, &  HTN presenting with AMS, poor PO intake, & episodes of N/V.  Gastroenteritis 2/2 covid infection ISO no covid vaccination. Pharmacy is consulted for the mgmt of potassium.  K+: 4.4>3.5>(20 meq IV KCL)>2.5>( ___ ) Mg2+: WNL  Goal of Therapy:  K+ WNL  Plan:  - Ordered for KCL PO and IV q1h in 4 doses.  Will follow up with AM labs  Martyn Malay ,PharmD Clinical Pharmacist 11/26/2020 3:00 PM

## 2020-11-26 NOTE — Progress Notes (Signed)
CBG at the bedside is 39. Pt is alert to self which is her baseline. Hypoglycemic PRN is followed. See MAR for adminstration

## 2020-11-26 NOTE — Progress Notes (Signed)
[facility]  - [place] [facility]   [name]    MR#:  [id number]  DATE OF BIRTH:  1932-02-28  SUBJECTIVE:  episode of hypoglycemia. During my evaluation she was drinking some orange juice. Sugars were 125. Patient has dementia not able to hold up much conversation however she is pleasant. REVIEW OF SYSTEMS:   Review of Systems  Unable to perform ROS: Mental status change   Tolerating Diet:yes Tolerating PT: pending  DRUG ALLERGIES:  No Known Allergies  VITALS:  Blood pressure (!) 161/75, pulse 80, temperature 98.1 F (36.7 C), resp. rate 16, weight 52 kg, SpO2 90 %.  PHYSICAL EXAMINATION:   Physical Exam  GENERAL:  87 y.o.-year-old patient lying in the bed with no acute distress.  LUNGS: Normal breath sounds bilaterally, no wheezing, rales, rhonchi. No use of accessory muscles of respiration.  CARDIOVASCULAR: S1, S2 normal. No murmurs, rubs, or gallops.  ABDOMEN: Soft, nontender, nondistended. Bowel sounds present. No organomegaly or mass.  EXTREMITIES: No cyanosis, clubbing or edema b/l.    NEUROLOGIC: moves all extremities spontaneously. Unable to do detail exam secondary to dementia. PSYCHIATRIC:  patient is alert and confused at baseline. SKIN: No obvious rash, lesion, or ulcer.   LABORATORY PANEL:  CBC Recent Labs  Lab 11/26/20 0408  WBC 13.5*  HGB 11.2*  HCT 33.2*  PLT 265    Chemistries  Recent Labs  Lab 11/24/20 0907 11/24/20 1419 11/26/20 0408 11/26/20 1023  NA 129*   < > 136  --   K 3.5   < > 2.5*  --   CL 87*   < > 96*  --   CO2 19*   < > 30  --   GLUCOSE 501*   < > 42*  --   BUN 14   < > 16  --   CREATININE 0.99   < > 0.44  --   CALCIUM 9.1   < > 8.5*  --   MG  --   --   --  1.9  AST 39  --   --   --   ALT 18  --   --   --   ALKPHOS 82  --   --   --   BILITOT 1.1  --   --   --    < > = values in this interval not displayed.   Cardiac Enzymes No results for input(s): TROPONINI in the last 168  hours. RADIOLOGY:  No results found. ASSESSMENT AND PLAN:  Melanie Turner is a 87 y.o. female with medical history significant for non-insulin-dependent diabetes mellitus on Metformin at home, dementia, hypertension, presented to the emergency department for chief concerns of altered mentation per family. Family states that on 11/23/2020, patient was not behaving like herself including poor p.o. intake and had unknown episodes of nausea and vomiting.  Sepsis-suspect secondary to COVID-19 infection -Remdesivir IV initiated--will do 3 doses -Solu-Medrol IV started per COVID-19 order panel -Blood cultures x2--so far negative -Broad-spectrum antibiotics narrowed down to IV cefepime. Will DC antibiotics today since  no bacterial source identified. -Patient received IV fluids per sepsis protocol. Lactic acid down to 9.9--5.1-- 1.7 -CXR no PNA  Type II diabetes uncontrolled/hyperglycemia -suspect secondary to sepsis in setting of COVID-19 infection -DKA protocol initiated-- however resolved. I believe patient was not on insulin drip. --Patient takes Metformin, sitagliptin 100 mg daily --A1c--10.1 (47 days)  Gastroenteritis suspect secondary to COVID-19 infection-treat as above -Remdesivir, Solu-Medrol IV daily--changed  to po prednisone  Acute metabolic encephalopathy in the setting of chronic advance dementia -resumed home donepezil 5 mg daily --patient's mentation is little better today.   Hypertension-takes amlodipine 5 mg daily  decreasedmovement of right lower extremity suspect secondary to closed right hip fracture-outpatient follow-up with orthopedic surgeon -- will attempt to get physical therapy tomorrow. Given dementia not sure how much I'll participate. According to her granddaughter [name] patient does ambulate some however most of the time family helps her out with her ADLs.  DVT prophylaxis: enoxaparin Code Status: dnr, confirmed with son via the phone--  admission Family Communication:  granddaughter [name] late Disposition Plan:  home likely tomorrow  consults called: dietary, toc Level of care: Med-Surg Status is: Inpatient               Patient currently is not medically stable to d/c.   Difficult to place patient No patient had episode of hypoglycemia. Will monitor one more day. Sugars are improving.Physical therapy to evaluate if patient participates. TOC for discharge plan.       TOTAL TIME TAKING CARE OF THIS PATIENT: 25 minutes.  >50% time spent on counselling and coordination of care  Note: This dictation was prepared with Dragon dictation along with smaller phrase technology. Any transcriptional errors that result from this process are unintentional.  [name] M.D    [facility]   CC: Primary care physician; Lauro Regulus, MDPatient ID: Melanie Turner, female   DOB: 1932-08-31, 87 y.o.   MRN: [id number]

## 2020-11-26 NOTE — Progress Notes (Signed)
Lab reports critical potassium 2.5 glucose is 42. Nurse will recheck capillary glucose at the bedside.

## 2020-11-26 NOTE — Progress Notes (Signed)
CBG recheck in 15 mins of post hypgolycemic protocol initiation is 121.

## 2020-11-26 NOTE — Progress Notes (Signed)
Inpatient Diabetes Program Recommendations  AACE/ADA: New Consensus Statement on Inpatient Glycemic Control (2015)  Target Ranges:  Prepandial:   less than 140 mg/dL      Peak postprandial:   less than 180 mg/dL (1-2 hours)      Critically ill patients:  140 - 180 mg/dL   Lab Results  Component Value Date   GLUCAP 125 (H) 11/26/2020   HGBA1C 10.1 (H) 10/09/2020    Review of Glycemic Control Results for Melanie Turner, Melanie Turner (MRN [id number]) as of 11/26/2020 09:50  Ref. Range 11/25/2020 08:15 11/25/2020 12:11 11/25/2020 15:29 11/25/2020 16:53 11/25/2020 22:56 11/26/2020 05:03 11/26/2020 05:37 11/26/2020 05:58 11/26/2020 07:37  Glucose-Capillary Latest Ref Range: 70 - 99 mg/dL 017 (H) 510 (H) 258 (H) 133 (H) 82 39 (LL) 121 (H) 107 (H) 125 (H)   Diabetes history: DM 2 Outpatient Diabetes medications:  Glucotrol 2.5 mg q AM, Metformin 1500 mg daily, Januvia 100 mg daily Current orders for Inpatient glycemic control:  Solumedrol 26 mg IV q 12 hours Novolog moderate tid with meals and HS Levemir 16 units q HS Novolog 3 units tid with meals Inpatient Diabetes Program Recommendations:    Consider d/c of Novolog meal coverage.  Also consider reduction of Levemir to 8 units q HS.   Thanks,  Beryl Meager, RN, BC-ADM Inpatient Diabetes Coordinator Pager [phone] (8a-5p)

## 2020-11-26 NOTE — Plan of Care (Signed)
  Problem: Education: Goal: Knowledge of risk factors and measures for prevention of condition will improve 11/26/2020 0000 by Sherlyn Lick, RN Outcome: Progressing 11/25/2020 2359 by Sherlyn Lick, RN Outcome: Progressing   Problem: Coping: Goal: Psychosocial and spiritual needs will be supported 11/26/2020 0000 by Sherlyn Lick, RN Outcome: Progressing 11/25/2020 2359 by Sherlyn Lick, RN Outcome: Progressing   Problem: Respiratory: Goal: Will maintain a patent airway 11/26/2020 0000 by Sherlyn Lick, RN Outcome: Progressing 11/25/2020 2359 by Sherlyn Lick, RN Outcome: Progressing Goal: Complications related to the disease process, condition or treatment will be avoided or minimized 11/26/2020 0000 by Sherlyn Lick, RN Outcome: Progressing 11/25/2020 2359 by Sherlyn Lick, RN Outcome: Progressing

## 2020-11-27 DIAGNOSIS — U071 COVID-19: Secondary | ICD-10-CM | POA: Diagnosis not present

## 2020-11-27 DIAGNOSIS — I10 Essential (primary) hypertension: Secondary | ICD-10-CM | POA: Diagnosis not present

## 2020-11-27 DIAGNOSIS — R4182 Altered mental status, unspecified: Secondary | ICD-10-CM

## 2020-11-27 DIAGNOSIS — G309 Alzheimer's disease, unspecified: Secondary | ICD-10-CM | POA: Diagnosis not present

## 2020-11-27 LAB — FIBRIN DERIVATIVES D-DIMER (ARMC ONLY): Fibrin derivatives D-dimer (ARMC): 703.15 ng/mL (FEU) — ABNORMAL HIGH (ref 0.00–499.00)

## 2020-11-27 LAB — C-REACTIVE PROTEIN: CRP: 3.7 mg/dL — ABNORMAL HIGH (ref <1.0)

## 2020-11-27 LAB — BASIC METABOLIC PANEL
Anion gap: 12 (ref 5–15)
BUN: 18 mg/dL (ref 8–23)
CO2: 29 mmol/L (ref 22–32)
Calcium: 8.2 mg/dL — ABNORMAL LOW (ref 8.9–10.3)
Chloride: 95 mmol/L — ABNORMAL LOW (ref 98–111)
Creatinine, Ser: 0.52 mg/dL (ref 0.44–1.00)
GFR, Estimated: >60 mL/min (ref >60)
Glucose, Bld: 255 mg/dL — ABNORMAL HIGH (ref 70–99)
Potassium: 3.8 mmol/L (ref 3.5–5.1)
Sodium: 136 mmol/L (ref 135–145)

## 2020-11-27 LAB — GLUCOSE, CAPILLARY: Glucose-Capillary: 197 mg/dL — ABNORMAL HIGH (ref 70–99)

## 2020-11-27 LAB — MAGNESIUM: Magnesium: 1.7 mg/dL (ref 1.7–2.4)

## 2020-11-27 MED ORDER — LORAZEPAM 0.5 MG PO TABS
0.5000 mg | ORAL_TABLET | ORAL | Status: DC | PRN
  Administered 2020-11-27: 0.5 mg via ORAL
  Filled 2020-11-27: qty 1

## 2020-11-27 MED ORDER — PREDNISONE 10 MG PO TABS: ORAL_TABLET | ORAL | 0 refills | Status: DC

## 2020-11-27 MED ORDER — CLONIDINE HCL 0.1 MG PO TABS
0.1000 mg | ORAL_TABLET | Freq: Four times a day (QID) | ORAL | Status: DC | PRN
Start: 2020-11-27 — End: 2020-11-27
  Administered 2020-11-27: 0.1 mg via ORAL
  Filled 2020-11-27: qty 1

## 2020-11-27 MED ORDER — BLOOD GLUCOSE MONITOR KIT: PACK | 0 refills | Status: DC

## 2020-11-27 MED ORDER — TRAZODONE HCL 50 MG PO TABS
25.0000 mg | ORAL_TABLET | Freq: Every evening | ORAL | Status: DC | PRN
  Administered 2020-11-27: 25 mg via ORAL
  Filled 2020-11-27: qty 1

## 2020-11-27 MED ORDER — GUAIFENESIN-DM 100-10 MG/5ML PO SYRP: 10.0000 mL | ORAL_SOLUTION | ORAL | 0 refills | Status: DC | PRN

## 2020-11-27 NOTE — Evaluation (Signed)
Physical Therapy Evaluation Patient Details Name: Melanie Turner MRN: [id number] DOB: [birth year] Today's Date: 11/27/2020   History of Present Illness  Pt is an 87 yo female with PMH sig for NIDDM, dementia, &  HTN presenting with AMS, poor PO intake, & episodes of N/V.  Gastroenteritis 2/2 covid infection.    Clinical Impression  Pt alert, but did have eyes closed for majority of session, needed extended time to answer PT questions. PLOF gathered from chart review and staff; pt ambulatory with walker at home as she wanted, lives with family.  The patient demonstrated supine to sit with maxA. Able to sit EOB for several minutes, varying from modA to maintain to CGA, pt often verbalized "I'm not ready yet" "not now, later today" when pt was encouraged to attempt mobility with PT. Sit <> Stand with two person hand held assist, totalAx2. Pt unable/unwilling to stand at this time. Returned to supine with second assist for safety and pt repositioned. The patient demonstrated acute decline in functional mobility, difficulty assessing due to true weakness or cognitive status. The would benefit from PT however to maximize safety, mobility and independence. Recommendation is HHPT with 24/7 supervision/assistance.     Follow Up Recommendations Home health PT;Supervision/Assistance - 24 hour    Equipment Recommendations  None recommended by PT    Recommendations for Other Services       Precautions / Restrictions Precautions Precautions: Fall Restrictions Weight Bearing Restrictions: No      Mobility  Bed Mobility Overal bed mobility: Needs Assistance Bed Mobility: Supine to Sit;Sit to Supine     Supine to sit: Max assist;HOB elevated Sit to supine: Total assist;HOB elevated;+2 for safety/equipment        Transfers Overall transfer level: Needs assistance   Transfers: Sit to/from Stand Sit to Stand: Total assist;+2 physical assistance            Ambulation/Gait              General Gait Details: unable  Stairs            Wheelchair Mobility    Modified Rankin (Stroke Patients Only)       Balance Overall balance assessment: Needs assistance Sitting-balance support: Feet supported Sitting balance-[name] Scale: Poor Sitting balance - Comments: pt intermittently able to sit without PT assist but needed at least unilateral support this AM to maintain balance     Standing balance-[name] Scale: Zero Standing balance comment: 2+ total assist                             Pertinent Vitals/Pain Pain Assessment: No/denies pain    Home Living                   Additional Comments: Pt did not provide PLOF, per chart was living with her mother in a 1 level home with stairs 10 months ago. Per CSW and MD, pt ambulatory sometimes when she wanted to walk    Prior Function                 Hand Dominance        Extremity/Trunk Assessment   Upper Extremity Assessment Upper Extremity Assessment: Difficult to assess due to impaired cognition    Lower Extremity Assessment Lower Extremity Assessment: Difficult to assess due to impaired cognition;Generalized weakness    Cervical / Trunk Assessment Cervical / Trunk Assessment: Kyphotic  Communication      Cognition Arousal/Alertness:  Awake/alert Behavior During Therapy: Flat affect Overall Cognitive Status: No family/caregiver present to determine baseline cognitive functioning                                 General Comments: oriented to name      General Comments      Exercises     Assessment/Plan    PT Assessment Patient needs continued PT services  PT Problem List Decreased strength;Decreased activity tolerance;Decreased balance;Decreased range of motion;Decreased knowledge of use of DME       PT Treatment Interventions DME instruction;Therapeutic exercise;Gait training;Balance training;Stair training;Neuromuscular re-education;Functional  mobility training;Therapeutic activities;Patient/family education    PT Goals (Current goals can be found in the Care Plan section)  Acute Rehab PT Goals Patient Stated Goal: to go home PT Goal Formulation: With family Time For Goal Achievement: 12/11/20 Potential to Achieve Goals: Fair    Frequency Min 2X/week   Barriers to discharge        Co-evaluation               AM-PAC PT "6 Clicks" Mobility  Outcome Measure Help needed turning from your back to your side while in a flat bed without using bedrails?: Total Help needed moving from lying on your back to sitting on the side of a flat bed without using bedrails?: Total Help needed moving to and from a bed to a chair (including a wheelchair)?: Total Help needed standing up from a chair using your arms (e.g., wheelchair or bedside chair)?: Total Help needed to walk in hospital room?: Total Help needed climbing 3-5 steps with a railing? : Total 6 Click Score: 6    End of Session   Activity Tolerance: Patient limited by fatigue Patient left: with call bell/phone within reach;with bed alarm set;in bed Nurse Communication: Mobility status PT Visit Diagnosis: Other abnormalities of gait and mobility (R26.89);Muscle weakness (generalized) (M62.81)    Time: 1751-0258 PT Time Calculation (min) (ACUTE ONLY): 23 min   Charges:   PT Evaluation $PT Eval Low Complexity: 1 Low PT Treatments $Therapeutic Activity: 8-22 mins       Olga Coaster PT, DPT 10:04 AM,11/27/20

## 2020-11-27 NOTE — Discharge Instructions (Signed)
Keep log of sugars at home 

## 2020-11-27 NOTE — TOC Initial Note (Signed)
Transition of Care [facility]) - Initial/Assessment Note    Patient Details  Name: Melanie Turner MRN: [id number] Date of Birth: 06-14-1932  Transition of Care [facility]) CM/SW Contact:    Allayne Butcher, RN Phone Number: 11/27/2020, 9:01 AM  Clinical Narrative:                 Patient admitted to the hospital with altered mental status and COVID.  Patient has dementia.   RNCM was able to speak with patient's daughter, [employer], via phone.  Donza reports that the patient lives with her niece and [employer]a [employer] is there quite often.  Family cares for the patient and helps with her toileting, bathing, and preparing meals.  Patient has been able to dress herself and put her shoes on.  Family does not want patient to go to SNF but agrees to having home health services set up.  Patient has a walker and hospital bed at home but they would like a new mattress.  [employer] contacted to order standard hospital be mattress.  Patient's niece will pick her up at discharge.  [facility] has been arranged with [facility] for RN, PT, OT and aide.  Kandee Keen with [facility] accepted referral and is aware of discharge today.    Expected Discharge Plan: Home w Home Health Services Barriers to Discharge: No Barriers Identified   Patient Goals and CMS Choice Patient states their goals for this hospitalization and ongoing recovery are:: Family wants patient back home CMS Medicare.gov Compare Post Acute Care list provided to:: Patient Represenative (must comment) Choice offered to / list presented to : Adult Children  Expected Discharge Plan and Services Expected Discharge Plan: Home w Home Health Services   Discharge Planning Services: CM Consult Post Acute Care Choice: Home Health Living arrangements for the past 2 months: Single Family Home Expected Discharge Date: 11/27/20               DME Arranged: Specialty mattress (hospital bed mattress with gel overlay) DME Agency: [employer] Date DME Agency Contacted: 11/27/20 Time DME Agency  Contacted: [phone] Representative spoke with at DME Agency: zach HH Arranged: RN,PT,OT,Nurse's Aide HH Agency: [facility] Date [facility] Agency Contacted: 11/27/20 Time HH Agency Contacted: [phone] Representative spoke with at [facility] Agency: Kandee Keen  Prior Living Arrangements/Services Living arrangements for the past 2 months: Single Family Home Lives with:: Relatives,Adult Children Patient language and need for interpreter reviewed:: Yes Do you feel safe going back to the place where you live?: Yes      Need for Family Participation in Patient Care: Yes (Comment) (COVID, Dementia) Care giver support system in place?: Yes (comment) (daughter, niece) Current home services: DME (walker, hospital bed) Criminal Activity/Legal Involvement Pertinent to Current Situation/Hospitalization: No - Comment as needed  Activities of Daily Living      Permission Sought/Granted Permission sought to share information with : Case Manager,Family Supports,Other (comment) Permission granted to share information with : Yes, Verbal Permission Granted  Share Information with NAME: Donza  Permission granted to share info w AGENCY: [facility]  Permission granted to share info w Relationship: daughter     Emotional Assessment         Alcohol / Substance Use: Not Applicable Psych Involvement: No (comment)  Admission diagnosis:  Sepsis (HCC) [A41.9] Altered mental status, unspecified altered mental status type [R41.82] Acute metabolic encephalopathy [G93.41] Sepsis with encephalopathy, due to unspecified organism, unspecified whether septic shock present (HCC) [A41.9, R65.20, G93.40] COVID-19 virus infection [U07.1] Patient Active Problem List  Diagnosis Date Noted  . Altered mental status   . Sepsis (HCC) 11/24/2020  . COVID-19 virus infection 11/24/2020  . Gastroenteritis due to COVID-19 virus 11/24/2020  . DKA (diabetic ketoacidosis) (HCC) 11/24/2020  . Acute metabolic encephalopathy 10/10/2020  .  Hyperglycemia due to type 2 diabetes mellitus (HCC) 10/10/2020  . Hypomagnesemia 10/10/2020  . UTI (urinary tract infection) 01/28/2020  . Dementia (HCC) 01/28/2020  . Hypokalemia 01/28/2020  . Fall at home, initial encounter 01/28/2020  . Closed right hip fracture (HCC) 01/28/2020  . Type II diabetes mellitus with renal manifestations (HCC) 03/14/2019  . Benign essential HTN 03/14/2019  . Seizure (HCC) 03/08/2019   PCP:  Lauro Regulus, MD Pharmacy:   [facility] [phone] [place], [place] - [address] [place] [address] Phone: [phone] Fax: [phone]  [facility] [place], [place] - [address] [place] [address] Phone: [phone] Fax: [phone]     Social Determinants of Health (SDOH) Interventions    Readmission Risk Interventions No flowsheet data found.

## 2020-11-27 NOTE — Discharge Summary (Addendum)
[place] [facility] [place] NAME: Melanie Turner    MR#:  [id number]  DATE OF BIRTH:  [birth year]  DATE OF ADMISSION:  11/24/2020 ADMITTING PHYSICIAN: Fritzi Mandes, MD  DATE OF DISCHARGE: 11/27/2020  PRIMARY CARE PHYSICIAN: Kirk Ruths, MD    ADMISSION DIAGNOSIS:  Sepsis ([place]) [A41.9] Altered mental status, unspecified altered mental status type [T51.76] Acute metabolic encephalopathy [H60.73] Sepsis with encephalopathy, due to unspecified organism, unspecified whether septic shock present ([place]) [A41.9, R65.20, G93.40] COVID-19 virus infection [U07.1]  DISCHARGE DIAGNOSIS:  Acute metabolic encephalopathy--pt appears at baseline (has advance dementia) Sepsis on POA due to COVID infection Gastroenteritis--resolved SECONDARY DIAGNOSIS:   Past Medical History:  Diagnosis Date  . Diabetes mellitus without complication ([place])   . Hypertension     HOSPITAL COURSE:  Nhu Leathis a 87 y.o.femalewith medical history significant fornon-insulin-dependent diabetes mellitus on Metformin at home, dementia, hypertension, presented to the emergency department for chief concerns of altered mentation per family. Family states that on 11/23/2020, patient was not behaving like herself including poor p.o. intake and had unknown episodes of nausea and vomiting.  Sepsis-suspect secondary to COVID-19 infection -Remdesivir IV initiated--completed 3 doses -Solu-Medrol IV started per COVID-19 order panel--chaged to po taper -Blood cultures x2--so far negative - DCed antibiotics today since  no bacterial source identified. -Patient received IV fluids per sepsis protocol. Lactic acid down to 9.9--5.1-- 1.7 -CXR no PNA -sats >92% onRA  Type II diabetes uncontrolled/hyperglycemia -suspect secondary to sepsis in setting of COVID-19 infection --resumed Metformin,sitagliptin  Daily. D/c glipizide --hypoglycemia x1 in hospital--sugars now  100--  200 --A1c--10.1 (11/26/20)--monitor sugars at home and d/w PCP  Gastroenteritis suspect secondary to COVID-19 infection-treat as above -improved --tolerating po diet  Acute metabolic encephalopathy in the setting of chronic advance dementia -resumed home donepezil 5 mg daily --patient's mentation is little better today.   Hypertension- amlodipine 5 mg daily  decreasedmovement of right lower extremity suspect secondary to closed right hip fracture-outpatient follow-up with orthopedic surgeon -- will attempt to get physical therapy today Given dementia not sure how much I'll participate. According to her granddaughter Dorothe Pea patient does ambulate some however most of the time family helps her out with her ADLs.  DVT prophylaxis:enoxaparin Code Status:dnr, confirmed with son via the phone-- admission Family Communication: granddaughter Ramiro Harvest today 1/28 Disposition Plan: home today after seen by PT consults called:dietary, toc Level of care: Med-Surg Status is: Inpatient   Patient currently is  Medically best at baseline to d/c.              Difficult to place patient No  CONSULTS OBTAINED:    DRUG ALLERGIES:  No Known Allergies  DISCHARGE MEDICATIONS:   Allergies as of 11/27/2020   No Known Allergies     Medication List    STOP taking these medications   glipiZIDE 5 MG tablet Commonly known as: GLUCOTROL     TAKE these medications   amLODipine 5 MG tablet Commonly known as: NORVASC Take 1 tablet (5 mg total) by mouth daily. Notes to patient: Start 1/29   blood glucose meter kit and supplies Kit Dispense based on patient and insurance preference. Use up to four times daily as directed. (FOR ICD-9 250.00, 250.01).   clotrimazole-betamethasone cream Commonly known as: LOTRISONE Apply 1 application topically 2 (two) times daily.   DERMACLOUD EX Apply 1 application topically 2 (two) times daily. To sacrum and buttocks for nonspecific  rash.   donepezil 5 MG tablet  Commonly known as: ARICEPT Take 5 mg by mouth daily.   guaiFENesin-dextromethorphan 100-10 MG/5ML syrup Commonly known as: ROBITUSSIN DM Take 10 mLs by mouth every 4 (four) hours as needed for cough.   meloxicam 7.5 MG tablet Commonly known as: MOBIC Take 7.5 mg by mouth daily.   metFORMIN 500 MG 24 hr tablet Commonly known as: GLUCOPHAGE-XR Take 1,500 mg by mouth daily. With dinner.   mirtazapine 7.5 MG tablet Commonly known as: REMERON Take 7.5 mg by mouth daily.   predniSONE 10 MG tablet Commonly known as: DELTASONE Take 50 mg daily taper by 10 mg then stop Notes to patient: Start 1/29   sitaGLIPtin 100 MG tablet Commonly known as: JANUVIA Take 100 mg by mouth daily.   traZODone 50 MG tablet Commonly known as: DESYREL Take 50 mg by mouth at bedtime.            Durable Medical Equipment  (From admission, onward)         Start     Ordered   11/27/20 0858  For home use only DME Other see comment  Once       Comments: Hospital bed mattress with gel overlay  Question:  Length of Need  Answer:  Lifetime   11/27/20 0857          If you experience worsening of your admission symptoms, develop shortness of breath, life threatening emergency, suicidal or homicidal thoughts you must seek medical attention immediately by calling 911 or calling your MD immediately  if symptoms less severe.  You Must read complete instructions/literature along with all the possible adverse reactions/side effects for all the Medicines you take and that have been prescribed to you. Take any new Medicines after you have completely understood and accept all the possible adverse reactions/side effects.   Please note  You were cared for by a hospitalist during your hospital stay. If you have any questions about your discharge medications or the care you received while you were in the hospital after you are discharged, you can call the unit and asked to speak  with the hospitalist on call if the hospitalist that took care of you is not available. Once you are discharged, your primary care physician will handle any further medical issues. Please note that NO REFILLS for any discharge medications will be authorized once you are discharged, as it is imperative that you return to your primary care physician (or establish a relationship with a primary care physician if you do not have one) for your aftercare needs so that they can reassess your need for medications and monitor your lab values. Today   SUBJECTIVE   Confused last nite per RN. recieved po ativan Sleeping Sugars ok  VITAL SIGNS:  Blood pressure (!) 153/80, pulse (!) 59, temperature 98 F (36.7 C), resp. rate 16, weight 52 kg, SpO2 100 %.  I/O:    Intake/Output Summary (Last 24 hours) at 11/27/2020 1302 Last data filed at 11/27/2020 0900 Gross per 24 hour  Intake 19.02 ml  Output 2170 ml  Net -2150.98 ml    PHYSICAL EXAMINATION:  GENERAL:  87 y.o.-year-old patient lying in the bed with no acute distress.  LUNGS: Normal breath sounds bilaterally, no wheezing, rales,rhonchi or crepitation. No use of accessory muscles of respiration.  CARDIOVASCULAR: S1, S2 normal. No murmurs, rubs, or gallops.  ABDOMEN: Soft, non-tender, non-distended. Bowel sounds present. No organomegaly or mass.  EXTREMITIES: No pedal edema, cyanosis, or clubbing.  NEUROLOGIC: grossly no focal deficit  PSYCHIATRIC: The patient is sleepy SKIN: No obvious rash, lesion, or ulcer--per rn.   DATA REVIEW:   CBC  Recent Labs  Lab 11/26/20 0408  WBC 13.5*  HGB 11.2*  HCT 33.2*  PLT 265    Chemistries  Recent Labs  Lab 11/24/20 0907 11/24/20 1419 11/27/20 0334  NA 129*   < > 136  K 3.5   < > 3.8  CL 87*   < > 95*  CO2 19*   < > 29  GLUCOSE 501*   < > 255*  BUN 14   < > 18  CREATININE 0.99   < > 0.52  CALCIUM 9.1   < > 8.2*  MG  --    < > 1.7  AST 39  --   --   ALT 18  --   --   ALKPHOS 82  --   --    BILITOT 1.1  --   --    < > = values in this interval not displayed.    Microbiology Results   Recent Results (from the past 240 hour(s))  Urine culture     Status: None   Collection Time: 11/24/20  9:07 AM   Specimen: In/Out Cath Urine  Result Value Ref Range Status   Specimen Description   Final    IN/OUT CATH URINE Performed at [facility], [address] [place], [place] [address]    Special Requests   Final    NONE Performed at [facility], [address] [place], [place] [address]    Culture   Final    NO GROWTH Performed at [facility], [place] [address], [place], [place] [address]    Report Status 11/25/2020 FINAL  Final  Blood Culture (routine x 2)     Status: None (Preliminary result)   Collection Time: 11/24/20  9:07 AM   Specimen: BLOOD  Result Value Ref Range Status   Specimen Description BLOOD LEFT WRIST  Final   Special Requests   Final    BOTTLES DRAWN AEROBIC AND ANAEROBIC Blood Culture results may not be optimal due to an inadequate volume of blood received in culture bottles   Culture   Final    NO GROWTH 3 DAYS Performed at [facility], [address], [place], [place] [address]    Report Status PENDING  Incomplete  Blood Culture (routine x 2)     Status: None (Preliminary result)   Collection Time: 11/24/20  9:07 AM   Specimen: BLOOD  Result Value Ref Range Status   Specimen Description BLOOD RIGHT ANTECUBITAL  Final   Special Requests   Final    BOTTLES DRAWN AEROBIC AND ANAEROBIC Blood Culture results may not be optimal due to an inadequate volume of blood received in culture bottles   Culture   Final    NO GROWTH 3 DAYS Performed at [facility], [address], [place], [place] [address]    Report Status PENDING  Incomplete  SARS Coronavirus 2 by RT PCR (hospital order, performed in [place] hospital lab) Nasopharyngeal Nasopharyngeal Swab     Status: Abnormal   Collection Time: 11/24/20  11:12 AM   Specimen: Nasopharyngeal Swab  Result Value Ref Range Status   SARS Coronavirus 2 POSITIVE (A) NEGATIVE Final    Comment: RESULT CALLED TO, READ BACK BY AND VERIFIED WITH: HEATHER FISHER 11/24/20 1302 KLW (NOTE) SARS-CoV-2 target nucleic acids are DETECTED  SARS-CoV-2 RNA is generally detectable in  upper respiratory specimens  during the acute phase of infection.  Positive results are indicative  of the presence of the identified virus, but do not rule out bacterial infection or co-infection with other pathogens not detected by the test.  Clinical correlation with patient history and  other diagnostic information is necessary to determine patient infection status.  The expected result is negative.  Fact Sheet for Patients:   StrictlyIdeas.no   Fact Sheet for Healthcare Providers:   BankingDealers.co.za    This test is not yet approved or cleared by the [place] FDA and  has been authorized for detection and/or diagnosis of SARS-CoV-2 by FDA under an Emergency Use Authorization (EUA).  This EUA will remain in effect (meaning this test  can be used) for the duration of  the COVID-19 declaration under Section 564(b)(1) of the Act, 21 U.S.C. section 360-bbb-3(b)(1), unless the authorization is terminated or revoked sooner.  Performed at [facility], [address], [place], [place] [address]     RADIOLOGY:  No results found.   CODE STATUS:     Code Status Orders  (From admission, onward)         Start     Ordered   11/24/20 1336  Do not attempt resuscitation (DNR)  Continuous       Question Answer Comment  In the event of cardiac or respiratory ARREST Do not call a "code blue"   In the event of cardiac or respiratory ARREST Do not perform Intubation, CPR, defibrillation or ACLS   In the event of cardiac or respiratory ARREST Use medication by any route, position, wound care, and other measures to  relive pain and suffering. May use oxygen, suction and manual treatment of airway obstruction as needed for comfort.      11/24/20 1335        Code Status History    Date Active Date Inactive Code Status Order ID Comments User Context   11/24/2020 1329 11/24/2020 1335 Full Code [id number]  Criss Alvine, DO ED   10/10/2020 0045 10/11/2020 2343 DNR [id number]  Athena Masse, MD ED   01/28/2020 2100 02/04/2020 2318 DNR [id number]  Ivor Costa, MD ED   03/08/2019 2329 03/14/2019 2031 DNR [id number]  Cristal Generous, NP ED   Advance Care Planning Activity       TOTAL TIME TAKING CARE OF THIS PATIENT: *35* minutes.    Fritzi Mandes M.D  [facility]    CC: Primary care physician; Kirk Ruths, MD

## 2020-11-29 LAB — CULTURE, BLOOD (ROUTINE X 2)
Culture: NO GROWTH
Culture: NO GROWTH

## 2021-01-25 ENCOUNTER — Emergency Department
Admission: EM | Admit: 2021-01-25 | Discharge: 2021-01-25 | Disposition: A | Discharge status: Home / Self Care | Payer: Medicare HMO | Attending: Emergency Medicine | Admitting: Emergency Medicine

## 2021-01-25 ENCOUNTER — Emergency Department: Payer: Medicare HMO

## 2021-01-25 ENCOUNTER — Other Ambulatory Visit: Payer: Self-pay

## 2021-01-25 ENCOUNTER — Encounter: Payer: Self-pay | Admitting: Emergency Medicine

## 2021-01-25 DIAGNOSIS — Z79899 Other long term (current) drug therapy: Secondary | ICD-10-CM | POA: Insufficient documentation

## 2021-01-25 DIAGNOSIS — I10 Essential (primary) hypertension: Secondary | ICD-10-CM | POA: Diagnosis not present

## 2021-01-25 DIAGNOSIS — F0390 Unspecified dementia without behavioral disturbance: Secondary | ICD-10-CM | POA: Diagnosis not present

## 2021-01-25 DIAGNOSIS — Z8616 Personal history of COVID-19: Secondary | ICD-10-CM | POA: Diagnosis not present

## 2021-01-25 DIAGNOSIS — Z7984 Long term (current) use of oral hypoglycemic drugs: Secondary | ICD-10-CM | POA: Insufficient documentation

## 2021-01-25 DIAGNOSIS — R059 Cough, unspecified: Secondary | ICD-10-CM

## 2021-01-25 DIAGNOSIS — E1129 Type 2 diabetes mellitus with other diabetic kidney complication: Secondary | ICD-10-CM | POA: Diagnosis not present

## 2021-01-25 HISTORY — DX: Unspecified dementia, unspecified severity, without behavioral disturbance, psychotic disturbance, mood disturbance, and anxiety: F03.90

## 2021-01-25 MED ORDER — AMLODIPINE BESYLATE 5 MG PO TABS: 5.0000 mg | ORAL_TABLET | Freq: Every day | ORAL | 1 refills | Status: AC

## 2021-01-25 NOTE — ED Provider Notes (Signed)
[facility] Emergency Department Provider Note   ____________________________________________   Event Date/Time   First MD Initiated Contact with Patient 01/25/21 1546     (approximate)  I have reviewed the triage vital signs and the nursing notes.   HISTORY  Chief Complaint Cough    HPI Melanie Turner is a 87 y.o. female with a past medical history of dementia, type 2 diabetes, and hypertension who presents for cough that she states is productive of white sputum and has been present over the last week.  Denies any exacerbating or relieving factors.  Patient has had reactions to pollen in the past however son states that she does not go outside.  Son at bedside and provides this history.  He is also concerned that patient has not been taking any blood pressure medicine as it has not been prescribed and that she has been hypertensive over this past week as well.  Further history and review of systems unable to be obtained at this time given patient's mental status         Past Medical History:  Diagnosis Date  . Dementia ([place])   . Diabetes mellitus without complication ([place])   . Hypertension     Patient Active Problem List   Diagnosis Date Noted  . Altered mental status   . Sepsis ([place]) 11/24/2020  . COVID-19 virus infection 11/24/2020  . Gastroenteritis due to COVID-19 virus 11/24/2020  . DKA (diabetic ketoacidosis) ([place]) 11/24/2020  . Acute metabolic encephalopathy 20/94/7096  . Hyperglycemia due to type 2 diabetes mellitus ([place]) 10/10/2020  . Hypomagnesemia 10/10/2020  . UTI (urinary tract infection) 01/28/2020  . Dementia ([place]) 01/28/2020  . Hypokalemia 01/28/2020  . Fall at home, initial encounter 01/28/2020  . Closed right hip fracture ([place]) 01/28/2020  . Type II diabetes mellitus with renal manifestations ([place]) 03/14/2019  . Benign essential HTN 03/14/2019  . Seizure ([place]) 03/08/2019    Past Surgical History:  Procedure Laterality  Date  . HIP ARTHROPLASTY Right 01/29/2020   Procedure: ARTHROPLASTY BIPOLAR HIP (HEMIARTHROPLASTY);  Surgeon: Thornton Park, MD;  Location: [facility] ORS;  Service: Orthopedics;  Laterality: Right;  . THYROIDECTOMY      Prior to Admission medications   Medication Sig Start Date End Date Taking? Authorizing Provider  amLODipine (NORVASC) 5 MG tablet Take 1 tablet (5 mg total) by mouth daily. 01/25/21 01/25/22 Yes Naaman Plummer, MD  amLODipine (NORVASC) 5 MG tablet Take 1 tablet (5 mg total) by mouth daily. 02/05/20   Vashti Hey, MD  blood glucose meter kit and supplies KIT Dispense based on patient and insurance preference. Use up to four times daily as directed. (FOR ICD-9 250.00, 250.01). 11/27/20   Fritzi Mandes, MD  clotrimazole-betamethasone (LOTRISONE) cream Apply 1 application topically 2 (two) times daily.    [provider]  donepezil (ARICEPT) 5 MG tablet Take 5 mg by mouth daily.  06/01/18   [provider]  guaiFENesin-dextromethorphan (ROBITUSSIN DM) 100-10 MG/5ML syrup Take 10 mLs by mouth every 4 (four) hours as needed for cough. 11/27/20   Fritzi Mandes, MD  Infant Care Products [facility] EX) Apply 1 application topically 2 (two) times daily. To sacrum and buttocks for nonspecific rash.    [provider]  meloxicam (MOBIC) 7.5 MG tablet Take 7.5 mg by mouth daily. 09/01/20   [provider]  metFORMIN (GLUCOPHAGE-XR) 500 MG 24 hr tablet Take 1,500 mg by mouth daily. With dinner. 10/21/20   [provider]  mirtazapine (REMERON)  7.5 MG tablet Take 7.5 mg by mouth daily.  08/31/18   [provider]  predniSONE (DELTASONE) 10 MG tablet Take 50 mg daily taper by 10 mg then stop 11/27/20   Fritzi Mandes, MD  sitaGLIPtin (JANUVIA) 100 MG tablet Take 100 mg by mouth daily. 10/21/20 10/21/21  [provider]  traZODone (DESYREL) 50 MG tablet Take 50 mg by mouth at bedtime. 10/21/20 10/21/21  [provider]     Allergies Patient has no known allergies.  Family History  Problem Relation Age of Onset  . Cancer Brother        not know which type of cancer    Social History Social History   Tobacco Use  . Smoking status: Never Smoker  . Smokeless tobacco: Never Used  Vaping Use  . Vaping Use: Never used  Substance Use Topics  . Alcohol use: Yes  . Drug use: No    Review of Systems Unable to assess ____________________________________________   PHYSICAL EXAM:  VITAL SIGNS: ED Triage Vitals  Enc Vitals Group     BP 01/25/21 1410 (!) 181/121     Pulse Rate 01/25/21 1410 100     Resp 01/25/21 1410 16     Temp 01/25/21 1410 98.4 F (36.9 C)     Temp Source 01/25/21 1410 Oral     SpO2 01/25/21 1410 100 %     Weight 01/25/21 1412 105 lb (47.6 kg)     Height 01/25/21 1412 '5\' 2"'  (1.575 m)     Head Circumference --      Peak Flow --      Pain Score 01/25/21 1412 0     Pain Loc --      Pain Edu? --      Excl. in [place]? --    Constitutional: Alert and oriented. Well appearing and in no acute distress. Eyes: Conjunctivae are normal. PERRL. Head: Atraumatic. Nose: No congestion/rhinnorhea. Mouth/Throat: Mucous membranes are moist. Neck: No stridor Cardiovascular: Grossly normal heart sounds.  Good peripheral circulation. Respiratory: Normal respiratory effort.  No retractions. Gastrointestinal: Soft and nontender. No distention. Musculoskeletal: No obvious deformities Neurologic: Cooperative. No gross focal neurologic deficits are appreciated. Skin:  Skin is warm and dry. No rash noted. Psychiatric: Mood and affect are normal. Behavior is normal.  ____________________________________________   LABS (all labs ordered are listed, but only abnormal results are displayed)  Labs Reviewed - No data to display  RADIOLOGY  ED MD interpretation: 2 view chest x-ray shows no evidence of acute abnormalities including no pneumonia, pneumothorax, or widened mediastinum  Official  radiology report(s): DG Chest 2 View  Result Date: 01/25/2021 CLINICAL DATA:  Productive cough. EXAM: CHEST - 2 VIEW COMPARISON:  None. FINDINGS: The heart size and mediastinal contours are within normal limits. Both lungs are clear. The visualized skeletal structures are unremarkable. IMPRESSION: No active cardiopulmonary disease. Aortic Atherosclerosis (ICD10-I70.0). Electronically Signed   By: Marijo Conception M.D.   On: 01/25/2021 15:33    ____________________________________________   PROCEDURES  Procedure(s) performed (including Critical Care):  .1-3 Lead EKG Interpretation Performed by: Naaman Plummer, MD Authorized by: Naaman Plummer, MD     Interpretation: normal     ECG rate:  86   ECG rate assessment: normal     Rhythm: sinus rhythm     Ectopy: none     Conduction: normal       ____________________________________________   INITIAL IMPRESSION / ASSESSMENT AND PLAN / ED COURSE  As part  of my medical decision making, I reviewed the following data within the [place] notes reviewed and incorporated, Old chart reviewed, Radiograph reviewed and Notes from prior ED visits reviewed and incorporated        Presents to the emergency department complaining of high blood pressure. Patient is otherwise asymptomatic without confusion, chest pain, hematuria, or SOB.  Patient's breath sounds are clear to auscultation bilaterally, there is some small amount of erythema of the nasal turbulence and posterior oropharynx.  In the absence of fever or other systemic signs of illness, patient likely has seasonal allergies causing her cough.  Chest x-ray no signs of significant abnormality  DDx: CV, AMI, heart failure, renal infarction or failure or other end organ damage.  Disposition: Discussed with patient their elevated blood pressure and need for close outpatient management of their hypertension. Will provide a prescription for amlodipine 39m PO daily and  arrange for the patient to follow up in a primary care clinic      ____________________________________________   FINAL CLINICAL IMPRESSION(S) / ED DIAGNOSES  Final diagnoses:  Cough  Primary hypertension     ED Discharge Orders         Ordered    amLODipine (NORVASC) 5 MG tablet  Daily        01/25/21 1603           Note:  This document was prepared using Dragon voice recognition software and may include unintentional dictation errors.   BNaaman Plummer MD 01/25/21 [id number]

## 2021-01-25 NOTE — ED Triage Notes (Signed)
Patient has history of HTN.  Per history, patient was taking Norvasc in April of 2021.  Patient's son states she is not taking any BP medication.

## 2021-01-25 NOTE — ED Notes (Signed)
See triage note  Presents with cough    Cough has been prod at times  No fever

## 2021-01-25 NOTE — ED Triage Notes (Signed)
Productive cough at night x 1 week.  Patietn AAOx3.  Skin warm and dry. No SOB/ DOE  nAD

## 2021-03-17 ENCOUNTER — Emergency Department: Payer: Medicare HMO

## 2021-03-17 ENCOUNTER — Inpatient Hospital Stay
Admission: EM | Admit: 2021-03-17 | Discharge: 2021-03-19 | DRG: 378 | Disposition: A | Discharge status: Home / Self Care | Payer: Medicare HMO | Attending: Internal Medicine | Admitting: Internal Medicine

## 2021-03-17 ENCOUNTER — Other Ambulatory Visit: Payer: Self-pay

## 2021-03-17 DIAGNOSIS — R1110 Vomiting, unspecified: Secondary | ICD-10-CM | POA: Diagnosis not present

## 2021-03-17 DIAGNOSIS — K920 Hematemesis: Secondary | ICD-10-CM | POA: Diagnosis not present

## 2021-03-17 DIAGNOSIS — R509 Fever, unspecified: Secondary | ICD-10-CM | POA: Diagnosis not present

## 2021-03-17 DIAGNOSIS — Z96641 Presence of right artificial hip joint: Secondary | ICD-10-CM | POA: Diagnosis present

## 2021-03-17 DIAGNOSIS — Z79899 Other long term (current) drug therapy: Secondary | ICD-10-CM

## 2021-03-17 DIAGNOSIS — K222 Esophageal obstruction: Secondary | ICD-10-CM | POA: Diagnosis present

## 2021-03-17 DIAGNOSIS — E871 Hypo-osmolality and hyponatremia: Secondary | ICD-10-CM | POA: Diagnosis not present

## 2021-03-17 DIAGNOSIS — Z20822 Contact with and (suspected) exposure to covid-19: Secondary | ICD-10-CM | POA: Diagnosis present

## 2021-03-17 DIAGNOSIS — K922 Gastrointestinal hemorrhage, unspecified: Secondary | ICD-10-CM | POA: Diagnosis not present

## 2021-03-17 DIAGNOSIS — E860 Dehydration: Secondary | ICD-10-CM | POA: Diagnosis present

## 2021-03-17 DIAGNOSIS — R042 Hemoptysis: Secondary | ICD-10-CM | POA: Diagnosis not present

## 2021-03-17 DIAGNOSIS — E1165 Type 2 diabetes mellitus with hyperglycemia: Secondary | ICD-10-CM | POA: Diagnosis present

## 2021-03-17 DIAGNOSIS — Z993 Dependence on wheelchair: Secondary | ICD-10-CM

## 2021-03-17 DIAGNOSIS — Z7984 Long term (current) use of oral hypoglycemic drugs: Secondary | ICD-10-CM

## 2021-03-17 DIAGNOSIS — R6510 Systemic inflammatory response syndrome (SIRS) of non-infectious origin without acute organ dysfunction: Secondary | ICD-10-CM | POA: Diagnosis not present

## 2021-03-17 DIAGNOSIS — Z7401 Bed confinement status: Secondary | ICD-10-CM

## 2021-03-17 DIAGNOSIS — K449 Diaphragmatic hernia without obstruction or gangrene: Secondary | ICD-10-CM | POA: Diagnosis not present

## 2021-03-17 DIAGNOSIS — Z681 Body mass index (BMI) 19 or less, adult: Secondary | ICD-10-CM

## 2021-03-17 DIAGNOSIS — F1990 Other psychoactive substance use, unspecified, uncomplicated: Secondary | ICD-10-CM

## 2021-03-17 DIAGNOSIS — E890 Postprocedural hypothyroidism: Secondary | ICD-10-CM | POA: Diagnosis present

## 2021-03-17 DIAGNOSIS — E1169 Type 2 diabetes mellitus with other specified complication: Secondary | ICD-10-CM | POA: Diagnosis not present

## 2021-03-17 DIAGNOSIS — E440 Moderate protein-calorie malnutrition: Secondary | ICD-10-CM | POA: Diagnosis not present

## 2021-03-17 DIAGNOSIS — F0390 Unspecified dementia without behavioral disturbance: Secondary | ICD-10-CM | POA: Diagnosis not present

## 2021-03-17 DIAGNOSIS — I10 Essential (primary) hypertension: Secondary | ICD-10-CM | POA: Diagnosis present

## 2021-03-17 DIAGNOSIS — D62 Acute posthemorrhagic anemia: Secondary | ICD-10-CM | POA: Diagnosis not present

## 2021-03-17 DIAGNOSIS — Z66 Do not resuscitate: Secondary | ICD-10-CM | POA: Diagnosis present

## 2021-03-17 DIAGNOSIS — R000 Tachycardia, unspecified: Secondary | ICD-10-CM | POA: Diagnosis not present

## 2021-03-17 LAB — URINALYSIS, COMPLETE (UACMP) WITH MICROSCOPIC
Bacteria, UA: NONE SEEN
Bilirubin Urine: NEGATIVE
Glucose, UA: >=500 mg/dL — AB
Hgb urine dipstick: NEGATIVE
Ketones, ur: 5 mg/dL — AB
Leukocytes,Ua: NEGATIVE
Nitrite: NEGATIVE
Protein, ur: 100 mg/dL — AB
Specific Gravity, Urine: 1.03 (ref 1.005–1.030)
pH: 5 (ref 5.0–8.0)

## 2021-03-17 LAB — CBC
HCT: 45.3 % (ref 36.0–46.0)
Hemoglobin: 15 g/dL (ref 12.0–15.0)
MCH: 28.1 pg (ref 26.0–34.0)
MCHC: 33.1 g/dL (ref 30.0–36.0)
MCV: 85 fL (ref 80.0–100.0)
Platelets: 294 10*3/uL (ref 150–400)
RBC: 5.33 MIL/uL — ABNORMAL HIGH (ref 3.87–5.11)
RDW: 12.9 % (ref 11.5–15.5)
WBC: 10.9 10*3/uL — ABNORMAL HIGH (ref 4.0–10.5)
nRBC: 0.2 % (ref 0.0–0.2)

## 2021-03-17 LAB — COMPREHENSIVE METABOLIC PANEL
ALT: 15 U/L (ref 0–44)
AST: 33 U/L (ref 15–41)
Albumin: 4.2 g/dL (ref 3.5–5.0)
Alkaline Phosphatase: 72 U/L (ref 38–126)
Anion gap: 16 — ABNORMAL HIGH (ref 5–15)
BUN: 19 mg/dL (ref 8–23)
CO2: 22 mmol/L (ref 22–32)
Calcium: 9.4 mg/dL (ref 8.9–10.3)
Chloride: 92 mmol/L — ABNORMAL LOW (ref 98–111)
Creatinine, Ser: 0.85 mg/dL (ref 0.44–1.00)
GFR, Estimated: >60 mL/min (ref >60)
Glucose, Bld: 408 mg/dL — ABNORMAL HIGH (ref 70–99)
Potassium: 4.1 mmol/L (ref 3.5–5.1)
Sodium: 130 mmol/L — ABNORMAL LOW (ref 135–145)
Total Bilirubin: 1 mg/dL (ref 0.3–1.2)
Total Protein: 8.6 g/dL — ABNORMAL HIGH (ref 6.5–8.1)

## 2021-03-17 LAB — PROTIME-INR
INR: 1.1 (ref 0.8–1.2)
Prothrombin Time: 13.9 seconds (ref 11.4–15.2)

## 2021-03-17 LAB — CBG MONITORING, ED
Glucose-Capillary: 392 mg/dL — ABNORMAL HIGH (ref 70–99)
Glucose-Capillary: 304 mg/dL — ABNORMAL HIGH (ref 70–99)
Glucose-Capillary: 240 mg/dL — ABNORMAL HIGH (ref 70–99)

## 2021-03-17 LAB — TROPONIN I (HIGH SENSITIVITY): Troponin I (High Sensitivity): 7 ng/L (ref <18)

## 2021-03-17 LAB — LIPASE, BLOOD: Lipase: 29 U/L (ref 11–51)

## 2021-03-17 LAB — TYPE AND SCREEN
ABO/RH(D): O POS
Antibody Screen: NEGATIVE

## 2021-03-17 LAB — RESP PANEL BY RT-PCR (FLU A&B, COVID) ARPGX2
Influenza A by PCR: NEGATIVE
Influenza B by PCR: NEGATIVE
SARS Coronavirus 2 by RT PCR: NEGATIVE

## 2021-03-17 MED ORDER — HYDRALAZINE HCL 20 MG/ML IJ SOLN: 20.0000 mg | Freq: Four times a day (QID) | INTRAMUSCULAR | Status: DC | PRN

## 2021-03-17 MED ORDER — ONDANSETRON HCL 4 MG/2ML IJ SOLN
4.0000 mg | Freq: Once | INTRAMUSCULAR | Status: AC
  Administered 2021-03-17: 4 mg via INTRAVENOUS
  Filled 2021-03-17: qty 2

## 2021-03-17 MED ORDER — ONDANSETRON HCL 4 MG PO TABS: 4.0000 mg | ORAL_TABLET | Freq: Four times a day (QID) | ORAL | Status: DC | PRN

## 2021-03-17 MED ORDER — MORPHINE SULFATE (PF) 2 MG/ML IV SOLN
1.0000 mg | INTRAVENOUS | Status: DC | PRN
Start: 2021-03-17 — End: 2021-03-19

## 2021-03-17 MED ORDER — SODIUM CHLORIDE 0.9 % IV SOLN
80.0000 mg | Freq: Once | INTRAVENOUS | Status: AC
  Administered 2021-03-17: 80 mg via INTRAVENOUS
  Filled 2021-03-17: qty 80

## 2021-03-17 MED ORDER — AMLODIPINE BESYLATE 5 MG PO TABS
5.0000 mg | ORAL_TABLET | Freq: Every day | ORAL | Status: DC
  Administered 2021-03-17 – 2021-03-19 (×3): 5 mg via ORAL
  Filled 2021-03-17 (×3): qty 1

## 2021-03-17 MED ORDER — INSULIN ASPART 100 UNIT/ML IJ SOLN: 0.0000 [IU] | INTRAMUSCULAR | Status: DC

## 2021-03-17 MED ORDER — SODIUM CHLORIDE 0.9 % IV SOLN: INTRAVENOUS | Status: DC

## 2021-03-17 MED ORDER — SODIUM CHLORIDE 0.9 % IV SOLN
8.0000 mg/h | INTRAVENOUS | Status: DC
  Administered 2021-03-17 – 2021-03-18 (×2): 8 mg/h via INTRAVENOUS
  Filled 2021-03-17 (×2): qty 80

## 2021-03-17 MED ORDER — SODIUM CHLORIDE 0.9 % IV BOLUS
500.0000 mL | Freq: Once | INTRAVENOUS | Status: AC
  Administered 2021-03-17: 500 mL via INTRAVENOUS

## 2021-03-17 MED ORDER — ACETAMINOPHEN 325 MG PO TABS
650.0000 mg | ORAL_TABLET | Freq: Four times a day (QID) | ORAL | Status: DC | PRN
  Administered 2021-03-17: 650 mg via ORAL
  Filled 2021-03-17: qty 2

## 2021-03-17 MED ORDER — BISACODYL 5 MG PO TBEC: 5.0000 mg | DELAYED_RELEASE_TABLET | Freq: Every day | ORAL | Status: DC | PRN

## 2021-03-17 MED ORDER — DONEPEZIL HCL 5 MG PO TABS
5.0000 mg | ORAL_TABLET | Freq: Every day | ORAL | Status: DC
  Administered 2021-03-17 – 2021-03-18 (×2): 5 mg via ORAL
  Filled 2021-03-17 (×2): qty 1

## 2021-03-17 MED ORDER — ONDANSETRON HCL 4 MG/2ML IJ SOLN: 4.0000 mg | Freq: Four times a day (QID) | INTRAMUSCULAR | Status: DC | PRN

## 2021-03-17 MED ORDER — ACETAMINOPHEN 650 MG RE SUPP: 650.0000 mg | Freq: Four times a day (QID) | RECTAL | Status: DC | PRN

## 2021-03-17 MED ORDER — INSULIN ASPART 100 UNIT/ML IJ SOLN
0.0000 [IU] | INTRAMUSCULAR | Status: DC
  Administered 2021-03-17: 7 [IU] via SUBCUTANEOUS
  Administered 2021-03-18: 1 [IU] via SUBCUTANEOUS
  Administered 2021-03-18: 18:00:00 5 [IU] via SUBCUTANEOUS
  Administered 2021-03-18: 1 [IU] via SUBCUTANEOUS
  Administered 2021-03-18 – 2021-03-19 (×3): 2 [IU] via SUBCUTANEOUS
  Filled 2021-03-17 (×7): qty 1

## 2021-03-17 NOTE — ED Triage Notes (Addendum)
Pt comes with son with c/o vomiting blood that started today. Pt's mask has dark blood present on it.  Pt really communicating with this RN. Son states pt is usually talkative. Pt does have hx of dementia.

## 2021-03-17 NOTE — ED Notes (Signed)
Lab to collect blood cultures

## 2021-03-17 NOTE — ED Notes (Signed)
Family has been updated

## 2021-03-17 NOTE — H&P (Signed)
History and Physical    Melanie Turner JXB:[id number] DOB: June 19, 1932 DOA: 03/17/2021  PCP: Kirk Ruths, MD Patient coming from: home  Chief Complaint: vomiting w/ blood   HPI: 87 y/o F w/ PMH of dementia, DM2, HTN who presented after 2 episodes of vomiting up blood x morning of admission. All of hx is obtained from pt's son who is at bedside.  Pt has never had hematemesis before. Pt does not take any NSAIDs (despite meloxicam being on pt's med rec), aspirin, anticoagulation. Pt did not have any fevers, chills, sweating, cough, chest pain, shortness of breath, abd pain, dysuria, urinary urgency, urinary frequency, diarrhea or constipation. Pt has never had EGD or colonoscopy in the past   Review of Systems: As per HPI otherwise 14 point review of systems negative as per pt's son.    Past Medical History:  Diagnosis Date  . Dementia ([place])   . Diabetes mellitus without complication ([place])   . Hypertension     Past Surgical History:  Procedure Laterality Date  . HIP ARTHROPLASTY Right 01/29/2020   Procedure: ARTHROPLASTY BIPOLAR HIP (HEMIARTHROPLASTY);  Surgeon: Thornton Park, MD;  Location: [facility] ORS;  Service: Orthopedics;  Laterality: Right;  . THYROIDECTOMY       reports that she has never smoked. She has never used smokeless tobacco. She reports previous alcohol use. She reports that she does not use drugs.  No Known Allergies  Family History  Problem Relation Age of Onset  . Cancer Brother        not know which type of cancer     Prior to Admission medications   Medication Sig Start Date End Date Taking? Authorizing Provider  amLODipine (NORVASC) 5 MG tablet Take 1 tablet (5 mg total) by mouth daily. 02/05/20  Yes Vashti Hey, MD  metFORMIN (GLUCOPHAGE-XR) 500 MG 24 hr tablet Take 1,500 mg by mouth daily. With dinner. 10/21/20  Yes [provider]  amLODipine (NORVASC) 5 MG tablet Take 1 tablet (5 mg total) by mouth daily. Patient not  taking: Reported on 03/17/2021 01/25/21 01/25/22  Naaman Plummer, MD  blood glucose meter kit and supplies KIT Dispense based on patient and insurance preference. Use up to four times daily as directed. (FOR ICD-9 250.00, 250.01). 11/27/20   Fritzi Mandes, MD  clotrimazole-betamethasone (LOTRISONE) cream Apply 1 application topically 2 (two) times daily.    [provider]  donepezil (ARICEPT) 5 MG tablet Take 5 mg by mouth daily.  Patient not taking: No sig reported 06/01/18   [provider]  guaiFENesin-dextromethorphan (ROBITUSSIN DM) 100-10 MG/5ML syrup Take 10 mLs by mouth every 4 (four) hours as needed for cough. Patient not taking: No sig reported 11/27/20   Fritzi Mandes, MD  Infant Care Products [facility] EX) Apply 1 application topically 2 (two) times daily. To sacrum and buttocks for nonspecific rash.    [provider]  meloxicam (MOBIC) 7.5 MG tablet Take 7.5 mg by mouth daily. Patient not taking: No sig reported 09/01/20   [provider]  mirtazapine (REMERON) 7.5 MG tablet Take 7.5 mg by mouth daily.  Patient not taking: Reported on 03/17/2021 08/31/18   [provider]  predniSONE (DELTASONE) 10 MG tablet Take 50 mg daily taper by 10 mg then stop Patient not taking: No sig reported 11/27/20   Fritzi Mandes, MD  sitaGLIPtin (JANUVIA) 100 MG tablet Take 100 mg by mouth daily. Patient not taking: Reported on 03/17/2021 10/21/20 10/21/21  [provider]  traZODone ([place])  50 MG tablet Take 50 mg by mouth at bedtime. Patient not taking: Reported on 03/17/2021 10/21/20 10/21/21  [provider]    Physical Exam: Vitals:   03/17/21 1225 03/17/21 1300 03/17/21 1330 03/17/21 1430  BP: (!) 178/107 (!) 159/102 (!) 152/94 (!) 172/96  Pulse: (!) 121 (!) 106 (!) 105 (!) 103  Resp: 18 (!) 21 20 (!) 21  Temp: (!) 100.7 F (38.2 C)     TempSrc: Oral     SpO2: 100% 96% 97% 97%  Weight: 48 kg     Height: _0  (1.575 m)        Constitutional: NAD, calm, comfortable Vitals:   03/17/21 1225 03/17/21 1300 03/17/21 1330 03/17/21 1430  BP: (!) 178/107 (!) 159/102 (!) 152/94 (!) 172/96  Pulse: (!) 121 (!) 106 (!) 105 (!) 103  Resp: 18 (!) 21 20 (!) 21  Temp: (!) 100.7 F (38.2 C)     TempSrc: Oral     SpO2: 100% 96% 97% 97%  Weight: 48 kg     Height: _1  (1.575 m)      Eyes: PERRL, lids and conjunctivae normal ENMT: Mucous membranes are moist. Neck: normal, supple Respiratory: clear to auscultation bilaterally, no wheezing, no crackles. Normal respiratory effort. No accessory muscle use.  Cardiovascular: Regular rate and rhythm, no rubs / gallops. No extremity edema.  Abdomen: soft, no tenderness, ND, hypoactive bowel sounds  Musculoskeletal: no clubbing / cyanosis. No joint deformity upper and lower extremities.Poor muscle tone  Skin: no rashes, lesions. Neurologic: Moves all extremities. Decreased strength of b/l LE Psychiatric: Abnormal judgment and insight. Alert and awake. Flat mood and affect   Labs on Admission: I have personally reviewed following labs and imaging studies  CBC: Recent Labs  Lab 03/17/21 1253  WBC 10.9*  HGB 15.0  HCT 45.3  MCV 85.0  PLT 962   Basic Metabolic Panel: Recent Labs  Lab 03/17/21 1253  NA 130*  K 4.1  CL 92*  CO2 22  GLUCOSE 408*  BUN 19  CREATININE 0.85  CALCIUM 9.4   GFR: Estimated Creatinine Clearance: 36 mL/min (by C-G formula based on SCr of 0.85 mg/dL). Liver Function Tests: Recent Labs  Lab 03/17/21 1253  AST 33  ALT 15  ALKPHOS 72  BILITOT 1.0  PROT 8.6*  ALBUMIN 4.2   Recent Labs  Lab 03/17/21 1253  LIPASE 29   No results for input(s): AMMONIA in the last 168 hours. Coagulation Profile: No results for input(s): INR, PROTIME in the last 168 hours. Cardiac Enzymes: No results for input(s): CKTOTAL, CKMB, CKMBINDEX, TROPONINI in the last 168 hours. BNP (last 3 results) No results for input(s): PROBNP in the last 8760  hours. HbA1C: No results for input(s): HGBA1C in the last 72 hours. CBG: Recent Labs  Lab 03/17/21 1306  GLUCAP 392*   Lipid Profile: No results for input(s): CHOL, HDL, LDLCALC, TRIG, CHOLHDL, LDLDIRECT in the last 72 hours. Thyroid Function Tests: No results for input(s): TSH, T4TOTAL, FREET4, T3FREE, THYROIDAB in the last 72 hours. Anemia Panel: No results for input(s): VITAMINB12, FOLATE, FERRITIN, TIBC, IRON, RETICCTPCT in the last 72 hours. Urine analysis:    Component Value Date/Time   COLORURINE YELLOW (A) 11/24/2020 0907   APPEARANCEUR HAZY (A) 11/24/2020 0907   APPEARANCEUR Clear 06/30/2014 1501   LABSPEC 1.026 11/24/2020 0907   LABSPEC 1.011 06/30/2014 1501   PHURINE 6.0 11/24/2020 0907   GLUCOSEU >=500 (A) 11/24/2020 0907   GLUCOSEU Negative 06/30/2014 1501  HGBUR SMALL (A) 11/24/2020 0907   BILIRUBINUR NEGATIVE 11/24/2020 0907   BILIRUBINUR Negative 06/30/2014 1501   KETONESUR 20 (A) 11/24/2020 0907   PROTEINUR 100 (A) 11/24/2020 0907   NITRITE NEGATIVE 11/24/2020 0907   LEUKOCYTESUR NEGATIVE 11/24/2020 0907   LEUKOCYTESUR Trace 06/30/2014 1501    Radiological Exams on Admission: DG Chest Port 1 View  Result Date: 03/17/2021 CLINICAL DATA:  Vomiting blood. EXAM: PORTABLE CHEST 1 VIEW COMPARISON:  Multiple priors, most recent January 25, 2021. FINDINGS: Similar cardiomediastinal silhouette. Calcific atherosclerosis of the aorta. Chronic interstitial changes most notable in the apices are similar to prior studies. No new consolidation. No visible pleural effusions or pneumothorax on this single AP radiograph. IMPRESSION: No definite acute cardiopulmonary disease. Chronic interstitial changes most notable in the apices are similar to prior studies. Given the patient's reported hemoptysis, a CT of the chest could provide more sensitive evaluation if clinically indicated. Electronically Signed   By: Margaretha Sheffield MD   On: 03/17/2021 13:49    EKG: Independently  reviewed.   Assessment/Plan Active Problems:   * No active hospital problems. *   Possible GI bleed: 2 episodes of hematemesis at home. No NSAIDs, anticoagulation or aspirin use. Continue on PPI. H&H are WNL. Repeat H&H ordered. GI consulted  SIRS: meets criteria w/ fever, tachycardia, tachypnea but no source of infection currently. Blood cxs & urine cx ordered. Does not meet sepsis criteria as no source of infection identified currently  Dementia: will continue on home dose of donepezil when no longer NPO   HTN: uncontrolled. Will continue on home dose of amlodipine when no longer NPO. IV hydralazine prn   DM2: poorly controlled. Will hold home dose of metformin, januvia. Continue on SSI w/ accuchecks   Leukocytosis: likely reactive. Will continue to monitor   Hyponatremia: etiology unclear. Will continue to monitor   DVT prophylaxis: SCDs Code Status: DNR Family Communication: discussed pt's care w/ pt's son at bedside and answered his questions Disposition Plan: depends on PT/OT recs (not consulted yet) Consults called: GI, Dr. Bonna Gains  Admission status: inpatient    Wyvonnia Dusky MD [facility] Pager [phone]  If 7PM-7AM, please contact night-coverage   03/17/2021, 3:01 PM

## 2021-03-17 NOTE — ED Provider Notes (Signed)
[facility] Emergency Department Provider Note ____________________________________________   Event Date/Time   First MD Initiated Contact with Patient 03/17/21 1236     (approximate)  I have reviewed the triage vital signs and the nursing notes.   HISTORY  Chief Complaint Hematemesis  Level 5 caveat: History of present illness limited due to dementia  HPI Melanie Turner is a 87 y.o. female with PMH as noted below including diabetes, hypertension, and dementia (but no prior GI bleed history) presents with an episode of dark blood in the vomit, acute onset today.  Per the son, the patient appears less talkative than normal.  She had just 1 episode of vomiting.  She had no symptoms yesterday and ate normally.  She has no prior history of this.  Past Medical History:  Diagnosis Date  . Dementia ([place])   . Diabetes mellitus without complication ([place])   . Hypertension     Patient Active Problem List   Diagnosis Date Noted  . Altered mental status   . Sepsis ([place]) 11/24/2020  . COVID-19 virus infection 11/24/2020  . Gastroenteritis due to COVID-19 virus 11/24/2020  . DKA (diabetic ketoacidosis) ([place]) 11/24/2020  . Acute metabolic encephalopathy 12/87/8676  . Hyperglycemia due to type 2 diabetes mellitus ([place]) 10/10/2020  . Hypomagnesemia 10/10/2020  . UTI (urinary tract infection) 01/28/2020  . Dementia ([place]) 01/28/2020  . Hypokalemia 01/28/2020  . Fall at home, initial encounter 01/28/2020  . Closed right hip fracture ([place]) 01/28/2020  . Type II diabetes mellitus with renal manifestations ([place]) 03/14/2019  . Benign essential HTN 03/14/2019  . Seizure ([place]) 03/08/2019    Past Surgical History:  Procedure Laterality Date  . HIP ARTHROPLASTY Right 01/29/2020   Procedure: ARTHROPLASTY BIPOLAR HIP (HEMIARTHROPLASTY);  Surgeon: Thornton Park, MD;  Location: [facility] ORS;  Service: Orthopedics;  Laterality: Right;  . THYROIDECTOMY      Prior to  Admission medications   Medication Sig Start Date End Date Taking? Authorizing Provider  amLODipine (NORVASC) 5 MG tablet Take 1 tablet (5 mg total) by mouth daily. 02/05/20  Yes Vashti Hey, MD  metFORMIN (GLUCOPHAGE-XR) 500 MG 24 hr tablet Take 1,500 mg by mouth daily. With dinner. 10/21/20  Yes [provider]  amLODipine (NORVASC) 5 MG tablet Take 1 tablet (5 mg total) by mouth daily. Patient not taking: Reported on 03/17/2021 01/25/21 01/25/22  Naaman Plummer, MD  blood glucose meter kit and supplies KIT Dispense based on patient and insurance preference. Use up to four times daily as directed. (FOR ICD-9 250.00, 250.01). 11/27/20   Fritzi Mandes, MD  clotrimazole-betamethasone (LOTRISONE) cream Apply 1 application topically 2 (two) times daily.    [provider]  donepezil (ARICEPT) 5 MG tablet Take 5 mg by mouth daily.  Patient not taking: No sig reported 06/01/18   [provider]  guaiFENesin-dextromethorphan (ROBITUSSIN DM) 100-10 MG/5ML syrup Take 10 mLs by mouth every 4 (four) hours as needed for cough. Patient not taking: No sig reported 11/27/20   Fritzi Mandes, MD  Infant Care Products [facility] EX) Apply 1 application topically 2 (two) times daily. To sacrum and buttocks for nonspecific rash.    [provider]  meloxicam (MOBIC) 7.5 MG tablet Take 7.5 mg by mouth daily. Patient not taking: No sig reported 09/01/20   [provider]  mirtazapine (REMERON) 7.5 MG tablet Take 7.5 mg by mouth daily.  Patient not taking: Reported on 03/17/2021 08/31/18   [provider]  predniSONE (DELTASONE) 10 MG  tablet Take 50 mg daily taper by 10 mg then stop Patient not taking: No sig reported 11/27/20   Fritzi Mandes, MD  sitaGLIPtin (JANUVIA) 100 MG tablet Take 100 mg by mouth daily. Patient not taking: Reported on 03/17/2021 10/21/20 10/21/21  [provider]  traZODone (DESYREL) 50 MG tablet Take 50 mg by mouth at  bedtime. Patient not taking: Reported on 03/17/2021 10/21/20 10/21/21  [provider]    Allergies Patient has no known allergies.  Family History  Problem Relation Age of Onset  . Cancer Brother        not know which type of cancer    Social History Social History   Tobacco Use  . Smoking status: Never Smoker  . Smokeless tobacco: Never Used  Vaping Use  . Vaping Use: Never used  Substance Use Topics  . Alcohol use: Not Currently  . Drug use: No    Review of Systems Level 5 caveat: Unable to obtain review of systems due to dementia    ____________________________________________   PHYSICAL EXAM:  VITAL SIGNS: ED Triage Vitals [03/17/21 1225]  Enc Vitals Group     BP (!) 178/107     Pulse Rate (!) 121     Resp 18     Temp (!) 100.7 F (38.2 C)     Temp Source Oral     SpO2 100 %     Weight 105 lb 13.1 oz (48 kg)     Height '5\' 2"'  (1.575 m)     Head Circumference      Peak Flow      Pain Score      Pain Loc      Pain Edu?      Excl. in [place]?     Constitutional: Alert, oriented x1.  Relatively comfortable appearing, in no acute distress. Eyes: No conjunctival pallor. Head: Atraumatic. Nose: No congestion/rhinnorhea. Mouth/Throat: Mucous membranes are moist.   Neck: Normal range of motion.  Cardiovascular: Normal rate, regular rhythm. Grossly normal heart sounds.  Good peripheral circulation. Respiratory: Normal respiratory effort.  No retractions. Lungs CTAB. Gastrointestinal: Soft and nontender. No distention.  Genitourinary: No flank tenderness. Musculoskeletal: No lower extremity edema.  Extremities warm and well perfused.  Neurologic: Motor intact in all extremities. Skin:  Skin is warm and dry. No rash noted. Psychiatric: Calm and cooperative.  ____________________________________________   LABS (all labs ordered are listed, but only abnormal results are displayed)  Labs Reviewed  COMPREHENSIVE METABOLIC PANEL - Abnormal; Notable  for the following components:      Result Value   Sodium 130 (*)    Chloride 92 (*)    Glucose, Bld 408 (*)    Total Protein 8.6 (*)    Anion gap 16 (*)    All other components within normal limits  CBC - Abnormal; Notable for the following components:   WBC 10.9 (*)    RBC 5.33 (*)    All other components within normal limits  CBG MONITORING, ED - Abnormal; Notable for the following components:   Glucose-Capillary 392 (*)    All other components within normal limits  RESP PANEL BY RT-PCR (FLU A&B, COVID) ARPGX2  CULTURE, BLOOD (ROUTINE X 2)  CULTURE, BLOOD (ROUTINE X 2)  URINE CULTURE  LIPASE, BLOOD  URINALYSIS, COMPLETE (UACMP) WITH MICROSCOPIC  PROTIME-INR  TYPE AND SCREEN  TROPONIN I (HIGH SENSITIVITY)  TROPONIN I (HIGH SENSITIVITY)   ____________________________________________  EKG  ED ECG REPORT I, Arta Silence, the attending physician,  personally viewed and interpreted this ECG.  Date: 03/17/2021 EKG Time: 1242 Rate: 112 Rhythm: Sinus tachycardia QRS Axis: normal Intervals: normal ST/T Wave abnormalities: normal Narrative Interpretation: no evidence of acute ischemia  ____________________________________________  RADIOLOGY  Chest x-ray interpreted by me shows chronic interstitial changes bilaterally with no acute abnormality  ____________________________________________   PROCEDURES  Procedure(s) performed: No  Procedures  Critical Care performed: No ____________________________________________   INITIAL IMPRESSION / ASSESSMENT AND PLAN / ED COURSE  Pertinent labs & imaging results that were available during my care of the patient were reviewed by me and considered in my medical decision making (see chart for details).  87 year old female with PMH as noted above including diabetes, hypertension, and dementia presents with an episode of bloody vomiting.  The blood is described as dark, and she had just 1 episode.  She is unable to give  any other history.  The son states that the patient appears less talkative than normal and that she was in her usual health yesterday.  I reviewed the past medical records in [place].  The patient was most recently seen in the ED in March with a cough, and most recent admitted in January with sepsis and altered mental status.  On exam currently, the patient is alert and able to state her name but otherwise not answering most questions.  She can follow commands.  She has a low-grade fever and borderline tachycardia with otherwise normal vital signs.  There is some dried dark blood around the nose and on the tongue.  Her oropharynx is clear.  The abdomen is soft and nontender.  Exam is otherwise unremarkable.  Differential includes gastritis, PUD, duodenal ulcer.  The patient has no history of liver disease or cirrhosis so I do not suspect esophageal varices.  The etiology of the fever is unclear, differential includes COVID-19, other viral etiology, pneumonia or UTI.  We will place 2 IVs, give fluids and start the patient on a Protonix infusion, and obtain lab work-up.  I anticipate admission.  ----------------------------------------- 3:02 PM on 03/17/2021 -----------------------------------------  Lab work-up is reassuring.  The patient's hemoglobin is 15.  Her heart rate has improved.  The source for the fever is unclear.  The patient is COVID negative.  Urine is still pending.  She has had no further vomiting.  I consulted Dr. Bonna Gains from GI.  I then consulted Dr. Jimmye Norman from the hospitalist service for admission.  ____________________________________________   FINAL CLINICAL IMPRESSION(S) / ED DIAGNOSES  Final diagnoses:  Hematemesis  Upper GI bleed  Febrile illness      NEW MEDICATIONS STARTED DURING THIS VISIT:  New Prescriptions   No medications on file     Note:  This document was prepared using Dragon voice recognition software and may include unintentional dictation  errors.   Arta Silence, MD 03/17/21 [phone]

## 2021-03-17 NOTE — Consult Note (Signed)
Melanie Antigua, MD [address], [place], [place], [place], [address] [address], [place], [place], [place], [address] Phone: [phone]  Fax: [phone]  Consultation  Referring Provider:     Dr. Jimmye Turner Primary Care Physician:  Melanie Ruths, MD Reason for Consultation:    Hematemesis  Date of Admission:  03/17/2021 Date of Consultation:  03/17/2021         HPI:   Melanie Turner is a 87 y.o. female brought in by her son due to 2 episodes of hematemesis at home.  No prior history of GI bleed.  Patient denies any abdominal pain but has history of dementia.  Not on any anticoagulant at home.  Mobic is listed as one of her medications but it is commented that she has not been taking it.  It appears the patient had an upper endoscopy with Dr. Vira Turner in 2010.  The indication listed on the report is for abdominal pain and abnormal CT.  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  The upper endoscopy report is available under provation and reports normal esophagus and stomach.  It states that the ampulla could not be visualized with the forward-viewing scope and Dr. Verl Turner came and inserted the ERCP scope that showed the ampulla to be plump but not neoplastic appearing and was biopsied.  Pathology report not available.  No other or further notes available to evaluate if any other work-up was done or follow-up was done after this.  Past Medical History:  Diagnosis Date  . Dementia ([place])   . Diabetes mellitus without complication ([place])   . Hypertension     Past Surgical History:  Procedure Laterality Date  . HIP ARTHROPLASTY Right 01/29/2020   Procedure: ARTHROPLASTY BIPOLAR HIP (HEMIARTHROPLASTY);  Surgeon: Thornton Park, MD;  Location: [facility] ORS;  Service: Orthopedics;  Laterality: Right;  . THYROIDECTOMY      Prior to Admission medications   Medication Sig Start  Date End Date Taking? Authorizing Provider  amLODipine (NORVASC) 5 MG tablet Take 1 tablet (5 mg total) by mouth daily. 02/05/20  Yes Vashti Hey, MD  metFORMIN (GLUCOPHAGE-XR) 500 MG 24 hr tablet Take 1,500 mg by mouth daily. With dinner. 10/21/20  Yes [provider]  amLODipine (NORVASC) 5 MG tablet Take 1 tablet (5 mg total) by mouth daily. Patient not taking: Reported on 03/17/2021 01/25/21 01/25/22  Naaman Plummer, MD  blood glucose meter kit and supplies KIT Dispense based on patient and insurance preference. Use up to four times daily as directed. (FOR ICD-9 250.00, 250.01). 11/27/20   Fritzi Mandes, MD  clotrimazole-betamethasone (LOTRISONE) cream Apply 1 application topically 2 (two) times daily.    [provider]  donepezil (ARICEPT) 5 MG tablet Take 5 mg by mouth daily.  Patient not taking: No sig reported 06/01/18   [provider]  guaiFENesin-dextromethorphan (ROBITUSSIN DM) 100-10 MG/5ML syrup Take 10 mLs by mouth every 4 (four) hours as needed for cough. Patient not taking: No sig reported 11/27/20   Fritzi Mandes, MD  Infant Care Products [facility] EX) Apply 1 application topically 2 (two) times daily. To sacrum and buttocks for nonspecific rash.    [provider]  meloxicam (MOBIC) 7.5 MG tablet Take 7.5 mg by mouth daily. Patient not taking: No sig reported 09/01/20   [provider]  mirtazapine (REMERON) 7.5 MG tablet  Take 7.5 mg by mouth daily.  Patient not taking: Reported on 03/17/2021 08/31/18   [provider]  predniSONE (DELTASONE) 10 MG tablet Take 50 mg daily taper by 10 mg then stop Patient not taking: No sig reported 11/27/20   Fritzi Mandes, MD  sitaGLIPtin (JANUVIA) 100 MG tablet Take 100 mg by mouth daily. Patient not taking: Reported on 03/17/2021 10/21/20 10/21/21  [provider]  traZODone (DESYREL) 50 MG tablet Take 50 mg by mouth at bedtime. Patient not taking: Reported on 03/17/2021 10/21/20  10/21/21  [provider]    Family History  Problem Relation Age of Onset  . Cancer Brother        not know which type of cancer     Social History   Tobacco Use  . Smoking status: Never Smoker  . Smokeless tobacco: Never Used  Vaping Use  . Vaping Use: Never used  Substance Use Topics  . Alcohol use: Not Currently  . Drug use: No    Allergies as of 03/17/2021  . (No Known Allergies)    Review of Systems:    All systems reviewed and negative except where noted in HPI.   Physical Exam:  Vital signs in last 24 hours: Vitals:   03/17/21 1430 03/17/21 1530 03/17/21 1630 03/17/21 1631  BP: (!) 172/96 (!) 159/103 (!) 158/97   Pulse: (!) 103 (!) 104 (!) 105   Resp: (!) 21 17 (!) 22   Temp:    100.1 F (37.8 C)  TempSrc:    Oral  SpO2: 97% 96% 97%   Weight:      Height:         General:   Pleasant, cooperative in NAD Head:  Normocephalic and atraumatic. Eyes:   No icterus.   Conjunctiva pink. PERRLA. Ears:  Normal auditory acuity. Neck:  Supple; no masses or thyroidomegaly Lungs: Respirations even and unlabored. Lungs clear to auscultation bilaterally.   No wheezes, crackles, or rhonchi.  Abdomen:  Soft, nondistended, nontender. Normal bowel sounds. No appreciable masses or hepatomegaly.  No rebound or guarding.  Neurologic:  Alert and oriented x3;  grossly normal neurologically. Skin:  Intact without significant lesions or rashes. Cervical Nodes:  No significant cervical adenopathy. Psych:  Alert and cooperative. Normal affect.  LAB RESULTS: Recent Labs    03/17/21 1253  WBC 10.9*  HGB 15.0  HCT 45.3  PLT 294   BMET Recent Labs    03/17/21 1253  NA 130*  K 4.1  CL 92*  CO2 22  GLUCOSE 408*  BUN 19  CREATININE 0.85  CALCIUM 9.4   LFT Recent Labs    03/17/21 1253  PROT 8.6*  ALBUMIN 4.2  AST 33  ALT 15  ALKPHOS 72  BILITOT 1.0   PT/INR Recent Labs    03/17/21 1253  LABPROT 13.9  INR 1.1    STUDIES: DG Chest Port 1  View  Result Date: 03/17/2021 CLINICAL DATA:  Vomiting blood. EXAM: PORTABLE CHEST 1 VIEW COMPARISON:  Multiple priors, most recent January 25, 2021. FINDINGS: Similar cardiomediastinal silhouette. Calcific atherosclerosis of the aorta. Chronic interstitial changes most notable in the apices are similar to prior studies. No new consolidation. No visible pleural effusions or pneumothorax on this single AP radiograph. IMPRESSION: No definite acute cardiopulmonary disease. Chronic interstitial changes most notable in the apices are similar to prior studies. Given the patient's reported hemoptysis, a CT of the chest could provide more sensitive evaluation if clinically indicated. Electronically Signed  By: Margaretha Sheffield MD   On: 03/17/2021 13:49      Impression / Plan:   Stephenie Navejas is a 74 y.o. y/o female with history of dementia with presentation for hematemesis at home with hemoglobin normal at 15  IV Protonix has been started in the ER which is reasonable  Will await further labs to see if hemoglobin shows a drop to go along with a history of hematemesis at home  If patient has evidence of anemia, or any other evidence of GI bleeding, would recommend upper endoscopy for further evaluation  Differentials include esophagitis, versus ulcers  Patient is hemodynamically stable at this time and no indication for emergent endoscopy  PPI IV twice daily  Continue serial CBCs and transfuse PRN Avoid NSAIDs Maintain 2 large-bore IV lines Please page GI with any acute hemodynamic changes, or signs of active GI bleeding  If no signs of active GI bleeding, okay to start clear liquid diet and then n.p.o. past midnight  Thank you for involving me in the care of this patient.      LOS: 0 days   Virgel Manifold, MD  03/17/2021, 4:52 PM

## 2021-03-18 ENCOUNTER — Inpatient Hospital Stay: Payer: Medicare HMO | Admitting: Anesthesiology

## 2021-03-18 ENCOUNTER — Other Ambulatory Visit: Payer: Self-pay

## 2021-03-18 ENCOUNTER — Encounter: Payer: Self-pay | Admitting: Internal Medicine

## 2021-03-18 ENCOUNTER — Encounter
Admission: EM | Disposition: A | Discharge status: Home / Self Care | Payer: Self-pay | Source: Home / Self Care | Attending: Internal Medicine

## 2021-03-18 DIAGNOSIS — K222 Esophageal obstruction: Secondary | ICD-10-CM

## 2021-03-18 DIAGNOSIS — D62 Acute posthemorrhagic anemia: Secondary | ICD-10-CM | POA: Diagnosis not present

## 2021-03-18 DIAGNOSIS — K922 Gastrointestinal hemorrhage, unspecified: Secondary | ICD-10-CM

## 2021-03-18 DIAGNOSIS — K920 Hematemesis: Secondary | ICD-10-CM | POA: Diagnosis not present

## 2021-03-18 DIAGNOSIS — K449 Diaphragmatic hernia without obstruction or gangrene: Secondary | ICD-10-CM | POA: Diagnosis not present

## 2021-03-18 HISTORY — PX: ESOPHAGOGASTRODUODENOSCOPY: SHX5428

## 2021-03-18 LAB — CBC
HCT: 34.5 % — ABNORMAL LOW (ref 36.0–46.0)
Hemoglobin: 11.7 g/dL — ABNORMAL LOW (ref 12.0–15.0)
MCH: 28.5 pg (ref 26.0–34.0)
MCHC: 33.9 g/dL (ref 30.0–36.0)
MCV: 83.9 fL (ref 80.0–100.0)
Platelets: 238 10*3/uL (ref 150–400)
RBC: 4.11 MIL/uL (ref 3.87–5.11)
RDW: 13.4 % (ref 11.5–15.5)
WBC: 11.1 10*3/uL — ABNORMAL HIGH (ref 4.0–10.5)
nRBC: 0 % (ref 0.0–0.2)

## 2021-03-18 LAB — BASIC METABOLIC PANEL
Anion gap: 8 (ref 5–15)
BUN: 32 mg/dL — ABNORMAL HIGH (ref 8–23)
CO2: 25 mmol/L (ref 22–32)
Calcium: 8.7 mg/dL — ABNORMAL LOW (ref 8.9–10.3)
Chloride: 105 mmol/L (ref 98–111)
Creatinine, Ser: 1.09 mg/dL — ABNORMAL HIGH (ref 0.44–1.00)
GFR, Estimated: 49 mL/min — ABNORMAL LOW (ref >60)
Glucose, Bld: 137 mg/dL — ABNORMAL HIGH (ref 70–99)
Potassium: 3.5 mmol/L (ref 3.5–5.1)
Sodium: 138 mmol/L (ref 135–145)

## 2021-03-18 LAB — CBG MONITORING, ED
Glucose-Capillary: 143 mg/dL — ABNORMAL HIGH (ref 70–99)
Glucose-Capillary: 145 mg/dL — ABNORMAL HIGH (ref 70–99)
Glucose-Capillary: 112 mg/dL — ABNORMAL HIGH (ref 70–99)

## 2021-03-18 LAB — GLUCOSE, CAPILLARY
Glucose-Capillary: 107 mg/dL — ABNORMAL HIGH (ref 70–99)
Glucose-Capillary: 111 mg/dL — ABNORMAL HIGH (ref 70–99)
Glucose-Capillary: 185 mg/dL — ABNORMAL HIGH (ref 70–99)
Glucose-Capillary: 234 mg/dL — ABNORMAL HIGH (ref 70–99)
Glucose-Capillary: 261 mg/dL — ABNORMAL HIGH (ref 70–99)

## 2021-03-18 SURGERY — EGD (ESOPHAGOGASTRODUODENOSCOPY)
Anesthesia: General

## 2021-03-18 MED ORDER — PANTOPRAZOLE SODIUM 40 MG PO TBEC
40.0000 mg | DELAYED_RELEASE_TABLET | Freq: Every day | ORAL | Status: DC
  Administered 2021-03-18 – 2021-03-19 (×2): 40 mg via ORAL
  Filled 2021-03-18 (×2): qty 1

## 2021-03-18 MED ORDER — ACETAMINOPHEN 325 MG PO TABS: 650.0000 mg | ORAL_TABLET | Freq: Four times a day (QID) | ORAL | Status: DC | PRN

## 2021-03-18 MED ORDER — PROPOFOL 10 MG/ML IV BOLUS
INTRAVENOUS | Status: DC | PRN
  Administered 2021-03-18: 50 mg via INTRAVENOUS

## 2021-03-18 MED ORDER — ACETAMINOPHEN 650 MG RE SUPP: 650.0000 mg | Freq: Four times a day (QID) | RECTAL | Status: DC | PRN

## 2021-03-18 MED ORDER — PROPOFOL 500 MG/50ML IV EMUL
INTRAVENOUS | Status: DC | PRN
  Administered 2021-03-18: 75 ug/kg/min via INTRAVENOUS

## 2021-03-18 MED ORDER — PHENYLEPHRINE HCL (PRESSORS) 10 MG/ML IV SOLN
INTRAVENOUS | Status: DC | PRN
  Administered 2021-03-18: 100 ug via INTRAVENOUS

## 2021-03-18 MED ORDER — SODIUM CHLORIDE 0.9 % IV SOLN: INTRAVENOUS | Status: DC

## 2021-03-18 MED ORDER — LIDOCAINE HCL (CARDIAC) PF 100 MG/5ML IV SOSY
PREFILLED_SYRINGE | INTRAVENOUS | Status: DC | PRN
  Administered 2021-03-18: 30 mg via INTRAVENOUS

## 2021-03-18 NOTE — Care Management Important Message (Signed)
Important Message  Patient Details  Name: Melanie Turner MRN: [id number] Date of Birth: [birth year]   Medicare Important Message Given:  N/A - LOS <3 / Initial given by admissions  Initial Medicare IM reviewed by Lenice Pressman, Patient Access Associate on 03/18/2021 at 12:54am.     Johnell Comings 03/18/2021, 6:51 PM

## 2021-03-18 NOTE — Progress Notes (Signed)
PROGRESS NOTE    Melanie Turner  XNA:[id number] DOB: 09-08-1932 DOA: 03/17/2021 PCP: Melanie Ruths, MD   Brief Narrative: Taken from H&P.  87 y/o F w/ PMH of dementia, DM2, HTN who presented after 2 episodes of vomiting up blood x morning of admission. Pt has never had hematemesis before. Pt does not take any NSAIDs (despite meloxicam being on pt's med rec), aspirin, anticoagulation. Pt did not have any fevers, chills, sweating, cough, chest pain, shortness of breath, abd pain, dysuria, urinary urgency, urinary frequency, diarrhea or constipation. Per GI note patient did had an EGD done in 2010 when CT abdomen was concerning for some duodenal mass.  There was no mass found, pathology results are not available for review. Patient underwent EGD with GI today which was only positive for a small hiatal hernia.  No other acute abnormality which can explain hematemesis.  Subjective: Patient was seen and examined today.  Very limited encounter as patient was unable to answer questions.  She was awake and oriented to name only.  She denies any pain.  She did not had any more hematemesis since in the hospital.   Assessment & Plan:   Active Problems:   Hematemesis   Hiatal hernia   Schatzki's ring  Hematemesis.  No recurrence.  EGD without any significant abnormality which can explain hematemesis.  Hemoglobin at 11.7 today but looks like it was concentrated yesterday.  Mildly elevated BUN and creatinine.  Patient initially received Protonix infusion. -GI is recommending p.o. Protonix for 4 weeks and outpatient follow-up with them now. -P.o. Protonix twice daily. -Monitor hemoglobin  SIRS criteria.  Patient initially met SIRS criteria.  Blood cultures negative so far, urine cultures pending.  No other obvious source of infection. -Continue to monitor  Dementia.  Patient is oriented to name only. -Continue home dose of donepezil  Hypertension.  Blood pressure currently within  goal. -Continue home dose of amlodipine.  Type 2 diabetes mellitus.  CBG was markedly elevated on arrival.  Improved after starting SSI. -Continue with SSI -Keep holding home dose of metformin and Januvia -Patient will need close follow-up with PCP-some concern of being noncompliant as blood glucose level responded very well to SSI.  Leukocytosis.  Improving, most likely reactive -Continue to monitor  Pseudohyponatremia.  Secondary to hyperglycemia which has been resolved now.  Protein caloric malnutrition.  Patient appears malnourished Estimated body mass index is 19.35 kg/m as calculated from the following:   Height as of this encounter: 5' 2" (1.575 m).   Weight as of this encounter: 48 kg.  -Dietitian consult  Objective: Vitals:   03/18/21 1213 03/18/21 1223 03/18/21 1233 03/18/21 1312  BP: (!) 91/57 112/70 136/89 (!) 145/82  Pulse: 80 80 84 82  Resp: _0 Temp:      TempSrc:      SpO2: 97% 98% 97% 100%  Weight:      Height:        Intake/Output Summary (Last 24 hours) at 03/18/2021 1408 Last data filed at 03/18/2021 1158 Gross per 24 hour  Intake 1500 ml  Output 150 ml  Net 1350 ml   Filed Weights   03/17/21 1225 03/18/21 1134  Weight: 48 kg 48 kg    Examination:  General exam: Frail, malnourished elderly lady.  Appears calm and comfortable  Respiratory system: Clear to auscultation. Respiratory effort normal. Cardiovascular system: S1 & S2 heard, RRR. No JVD, murmurs, rubs, gallops or clicks. Gastrointestinal system: Soft, nontender, nondistended, bowel sounds  positive. Central nervous system: Alert and oriented to self only. No focal neurological deficits. Extremities: No edema, no cyanosis, pulses intact and symmetrical. Psychiatry: Judgement and insight appear impaired   DVT prophylaxis: SCDs Code Status: DNR Family Communication: Son was updated on phone. Disposition Plan:  Status is: Inpatient  Remains inpatient appropriate  because:Inpatient level of care appropriate due to severity of illness   Dispo: The patient is from: Home              Anticipated d/c is to: Home              Patient currently is not medically stable to d/c.   Difficult to place patient No                 Level of care: Med-Surg  All the records are reviewed and case discussed with Care Management/Social Worker. Management plans discussed with the patient, nursing and they are in agreement.  Consultants:   GI  Procedures:  Antimicrobials:   Data Reviewed: I have personally reviewed following labs and imaging studies  CBC: Recent Labs  Lab 03/17/21 1253 03/18/21 0512  WBC 10.9* 11.1*  HGB 15.0 11.7*  HCT 45.3 34.5*  MCV 85.0 83.9  PLT 294 591   Basic Metabolic Panel: Recent Labs  Lab 03/17/21 1253 03/18/21 0512  NA 130* 138  K 4.1 3.5  CL 92* 105  CO2 22 25  GLUCOSE 408* 137*  BUN 19 32*  CREATININE 0.85 1.09*  CALCIUM 9.4 8.7*   GFR: Estimated Creatinine Clearance: 28.1 mL/min (A) (by C-G formula based on SCr of 1.09 mg/dL (H)). Liver Function Tests: Recent Labs  Lab 03/17/21 1253  AST 33  ALT 15  ALKPHOS 72  BILITOT 1.0  PROT 8.6*  ALBUMIN 4.2   Recent Labs  Lab 03/17/21 1253  LIPASE 29   No results for input(s): AMMONIA in the last 168 hours. Coagulation Profile: Recent Labs  Lab 03/17/21 1253  INR 1.1   Cardiac Enzymes: No results for input(s): CKTOTAL, CKMB, CKMBINDEX, TROPONINI in the last 168 hours. BNP (last 3 results) No results for input(s): PROBNP in the last 8760 hours. HbA1C: No results for input(s): HGBA1C in the last 72 hours. CBG: Recent Labs  Lab 03/17/21 2025 03/18/21 0106 03/18/21 0412 03/18/21 0807 03/18/21 1141  GLUCAP 240* 143* 145* 112* 111*   Lipid Profile: No results for input(s): CHOL, HDL, LDLCALC, TRIG, CHOLHDL, LDLDIRECT in the last 72 hours. Thyroid Function Tests: No results for input(s): TSH, T4TOTAL, FREET4, T3FREE, THYROIDAB in the last 72  hours. Anemia Panel: No results for input(s): VITAMINB12, FOLATE, FERRITIN, TIBC, IRON, RETICCTPCT in the last 72 hours. Sepsis Labs: No results for input(s): PROCALCITON, LATICACIDVEN in the last 168 hours.  Recent Results (from the past 240 hour(s))  Resp Panel by RT-PCR (Flu A&B, Covid) Nasopharyngeal Swab     Status: None   Collection Time: 03/17/21  1:36 PM   Specimen: Nasopharyngeal Swab; Nasopharyngeal(NP) swabs in vial transport medium  Result Value Ref Range Status   SARS Coronavirus 2 by RT PCR NEGATIVE NEGATIVE Final    Comment: (NOTE) SARS-CoV-2 target nucleic acids are NOT DETECTED.  The SARS-CoV-2 RNA is generally detectable in upper respiratory specimens during the acute phase of infection. The lowest concentration of SARS-CoV-2 viral copies this assay can detect is 138 copies/mL. A negative result does not preclude SARS-Cov-2 infection and should not be used as the sole basis for treatment or other patient management decisions. A  negative result may occur with  improper specimen collection/handling, submission of specimen other than nasopharyngeal swab, presence of viral mutation(s) within the areas targeted by this assay, and inadequate number of viral copies(<138 copies/mL). A negative result must be combined with clinical observations, patient history, and epidemiological information. The expected result is Negative.  Fact Sheet for Patients:  EntrepreneurPulse.com.au  Fact Sheet for Healthcare Providers:  IncredibleEmployment.be  This test is no t yet approved or cleared by the [place] FDA and  has been authorized for detection and/or diagnosis of SARS-CoV-2 by FDA under an Emergency Use Authorization (EUA). This EUA will remain  in effect (meaning this test can be used) for the duration of the COVID-19 declaration under Section 564(b)(1) of the Act, 21 U.S.C.section 360bbb-3(b)(1), unless the authorization is  terminated  or revoked sooner.       Influenza A by PCR NEGATIVE NEGATIVE Final   Influenza B by PCR NEGATIVE NEGATIVE Final    Comment: (NOTE) The Xpert Xpress SARS-CoV-2/FLU/RSV plus assay is intended as an aid in the diagnosis of influenza from Nasopharyngeal swab specimens and should not be used as a sole basis for treatment. Nasal washings and aspirates are unacceptable for Xpert Xpress SARS-CoV-2/FLU/RSV testing.  Fact Sheet for Patients: EntrepreneurPulse.com.au  Fact Sheet for Healthcare Providers: IncredibleEmployment.be  This test is not yet approved or cleared by the [place] FDA and has been authorized for detection and/or diagnosis of SARS-CoV-2 by FDA under an Emergency Use Authorization (EUA). This EUA will remain in effect (meaning this test can be used) for the duration of the COVID-19 declaration under Section 564(b)(1) of the Act, 21 U.S.C. section 360bbb-3(b)(1), unless the authorization is terminated or revoked.  Performed at [facility], [address] [place], [place] [address]   CULTURE, BLOOD (ROUTINE X 2) w Reflex to ID Panel     Status: None (Preliminary result)   Collection Time: 03/17/21  4:15 PM   Specimen: BLOOD  Result Value Ref Range Status   Specimen Description BLOOD BLOOD LEFT HAND  Final   Special Requests   Final    BOTTLES DRAWN AEROBIC AND ANAEROBIC Blood Culture adequate volume   Culture   Final    NO GROWTH < 24 HOURS Performed at [facility], [address], [place], [place] [address]    Report Status PENDING  Incomplete  CULTURE, BLOOD (ROUTINE X 2) w Reflex to ID Panel     Status: None (Preliminary result)   Collection Time: 03/17/21  4:25 PM   Specimen: BLOOD  Result Value Ref Range Status   Specimen Description BLOOD BLOOD RIGHT HAND  Final   Special Requests   Final    BOTTLES DRAWN AEROBIC ONLY Blood Culture adequate volume   Culture   Final    NO GROWTH <  24 HOURS Performed at [facility], [address], [place], [place] [address]    Report Status PENDING  Incomplete     Radiology Studies: DG Chest Port 1 View  Result Date: 03/17/2021 CLINICAL DATA:  Vomiting blood. EXAM: PORTABLE CHEST 1 VIEW COMPARISON:  Multiple priors, most recent January 25, 2021. FINDINGS: Similar cardiomediastinal silhouette. Calcific atherosclerosis of the aorta. Chronic interstitial changes most notable in the apices are similar to prior studies. No new consolidation. No visible pleural effusions or pneumothorax on this single AP radiograph. IMPRESSION: No definite acute cardiopulmonary disease. Chronic interstitial changes most notable in the apices are similar to prior studies. Given the patient's reported hemoptysis, a  CT of the chest could provide more sensitive evaluation if clinically indicated. Electronically Signed   By: Margaretha Sheffield MD   On: 03/17/2021 13:49    Scheduled Meds: . amLODipine  5 mg Oral Daily  . donepezil  5 mg Oral QHS  . insulin aspart  0-9 Units Subcutaneous Q4H   Continuous Infusions: . sodium chloride Stopped (03/18/21 0958)  . pantoprozole (PROTONIX) infusion Stopped (03/18/21 0958)     LOS: 1 day   Time spent: 38 minutes. More than 50% of the time was spent in counseling/coordination of care  Lorella Nimrod, MD [facility]  If 7PM-7AM, please contact night-coverage [email or web address]  03/18/2021, 2:08 PM   This record has been created using Dragon voice recognition software. Errors have been sought and corrected,but may not always be located. Such creation errors do not reflect on the standard of care.

## 2021-03-18 NOTE — Op Note (Signed)
[facility] [name] Procedure Date: 03/18/2021 11:34 AM MRN: [id number] Account #: [id number] Date of Birth: 09/21/1932 Admit Type: Inpatient Age: 87 Room: [facility] ENDO ROOM 2 Gender: Female Note Status: Finalized Procedure:             Upper GI endoscopy Indications:           Hematemesis Providers:             Timarie Labell B. Maximino Greenland MD, MD Referring MD:          Marya Amsler. Dareen Piano MD, MD (Referring MD) Medicines:             Monitored Anesthesia Care Complications:         No immediate complications. Procedure:             Pre-Anesthesia Assessment:                        - The risks and benefits of the procedure and the                         sedation options and risks were discussed with the                         patient. All questions were answered and informed                         consent was obtained.                        - Patient identification and proposed procedure were                         verified prior to the procedure.                        - ASA Grade Assessment: II - A patient with mild                         systemic disease.                        After obtaining informed consent, the endoscope was                         passed under direct vision. Throughout the procedure,                         the patient's blood pressure, pulse, and oxygen                         saturations were monitored continuously. The Endoscope                         was introduced through the mouth, and advanced to the                         second part of duodenum. The upper GI endoscopy was                         accomplished with  ease. The patient tolerated the                         procedure well. Findings:      A widely patent and non-obstructing Schatzki ring was found at the       gastroesophageal junction.      The exam of the esophagus was otherwise normal.      A small hiatal hernia was present.       The exam of the stomach was otherwise normal.      The examined duodenum was normal. Impression:            - Widely patent and non-obstructing Schatzki ring.                        - Small hiatal hernia.                        - Normal examined duodenum.                        - No specimens collected. Recommendation:        - Return to GI clinic in 4 weeks.                        - Use Protonix (pantoprazole) 40 mg PO daily for 4                         weeks.                        - Return patient to hospital ward for ongoing care.                        - Continue present medications.                        - The findings and recommendations were discussed with                         the patient.                        - Follow an antireflux regimen.                        - The findings and recommendations were discussed with                         the patient.                        - The findings and recommendations were discussed with                         the patient's family. Procedure Code(s):     --- Professional ---                        [id number], Esophagogastroduodenoscopy, flexible,                         transoral; diagnostic, including collection  of                         specimen(s) by brushing or washing, when performed                         (separate procedure) Diagnosis Code(s):     --- Professional ---                        K22.2, Esophageal obstruction                        K44.9, Diaphragmatic hernia without obstruction or                         gangrene                        K92.0, Hematemesis CPT copyright 2019 American Medical Association. All rights reserved. The codes documented in this report are preliminary and upon coder review may  be revised to meet current compliance requirements.  Melodie Bouillon, MD Michel Bickers B. Maximino Greenland MD, MD 03/18/2021 12:02:48 PM This report has been signed electronically. Number of Addenda: 0 Note Initiated On:  03/18/2021 11:34 AM      [facility]

## 2021-03-18 NOTE — Progress Notes (Signed)
Attempted to cal son, "Melanie Turner", to complete admission profile. No answer at this time and voicemail box is not set up.

## 2021-03-18 NOTE — Anesthesia Postprocedure Evaluation (Signed)
Anesthesia Post Note  Patient: Control and instrumentation engineer  Procedure(s) Performed: ESOPHAGOGASTRODUODENOSCOPY (EGD) (N/A )  Patient location during evaluation: Endoscopy Anesthesia Type: General Level of consciousness: awake and alert Pain management: pain level controlled Vital Signs Assessment: post-procedure vital signs reviewed and stable Respiratory status: spontaneous breathing, nonlabored ventilation, respiratory function stable and patient connected to nasal cannula oxygen Cardiovascular status: blood pressure returned to baseline and stable Postop Assessment: no apparent nausea or vomiting Anesthetic complications: no   No complications documented.   Last Vitals:  Vitals:   03/18/21 1233 03/18/21 1312  BP: 136/89 (!) 145/82  Pulse: 84 82  Resp: 19   Temp:    SpO2: 97% 100%    Last Pain:  Vitals:   03/18/21 1233  TempSrc:   PainSc: 0-No pain                 Lenard Simmer

## 2021-03-18 NOTE — ED Notes (Signed)
Transport team called to bring pt to floor.

## 2021-03-18 NOTE — ED Notes (Signed)
Pt woke to finger prick for CBG. Pt spoke with clear speech. This is different than last shift when pt was not responding verbally, per night nurse. Pt denies needs at this time.

## 2021-03-18 NOTE — Progress Notes (Signed)
Melodie Bouillon, MD [address], [address], [place], [place], [address] [address], [place], [place], [address] Phone: [phone]  Fax: [phone]   Subjective: No acute events overnight.  Patient denies abdominal pain.  Hemoglobin dropped to 11.7 this morning, compared to 15 on admission   Objective: Exam: Vital signs in last 24 hours: Vitals:   03/18/21 0730 03/18/21 0800 03/18/21 0830 03/18/21 0900  BP: 114/69 104/69 132/69 117/72  Pulse: 76 84 76 79  Resp: 12 (!) 22 14 15   Temp:      TempSrc:      SpO2: 100% 97% 100% 99%  Weight:      Height:       Weight change:   Intake/Output Summary (Last 24 hours) at 03/18/2021 0953 Last data filed at 03/17/2021 1454 Gross per 24 hour  Intake 600 ml  Output --  Net 600 ml    General: No acute distress, AAO x3 Abd: Soft, NT/ND, No HSM Skin: Warm, no rashes Neck: Supple, Trachea midline   Lab Results: Lab Results  Component Value Date   WBC 11.1 (H) 03/18/2021   HGB 11.7 (L) 03/18/2021   HCT 34.5 (L) 03/18/2021   MCV 83.9 03/18/2021   PLT 238 03/18/2021   Micro Results: Recent Results (from the past 240 hour(s))  Resp Panel by RT-PCR (Flu A&B, Covid) Nasopharyngeal Swab     Status: None   Collection Time: 03/17/21  1:36 PM   Specimen: Nasopharyngeal Swab; Nasopharyngeal(NP) swabs in vial transport medium  Result Value Ref Range Status   SARS Coronavirus 2 by RT PCR NEGATIVE NEGATIVE Final    Comment: (NOTE) SARS-CoV-2 target nucleic acids are NOT DETECTED.  The SARS-CoV-2 RNA is generally detectable in upper respiratory specimens during the acute phase of infection. The lowest concentration of SARS-CoV-2 viral copies this assay can detect is 138 copies/mL. A negative result does not preclude SARS-Cov-2 infection and should not be used as the sole basis for treatment or other patient management decisions. A negative result may occur with  improper specimen collection/handling, submission of  specimen other than nasopharyngeal swab, presence of viral mutation(s) within the areas targeted by this assay, and inadequate number of viral copies(<138 copies/mL). A negative result must be combined with clinical observations, patient history, and epidemiological information. The expected result is Negative.  Fact Sheet for Patients:  03/19/21  Fact Sheet for Healthcare Providers:  BloggerCourse.com  This test is no t yet approved or cleared by the [place] FDA and  has been authorized for detection and/or diagnosis of SARS-CoV-2 by FDA under an Emergency Use Authorization (EUA). This EUA will remain  in effect (meaning this test can be used) for the duration of the COVID-19 declaration under Section 564(b)(1) of the Act, 21 U.S.C.section 360bbb-3(b)(1), unless the authorization is terminated  or revoked sooner.       Influenza A by PCR NEGATIVE NEGATIVE Final   Influenza B by PCR NEGATIVE NEGATIVE Final    Comment: (NOTE) The Xpert Xpress SARS-CoV-2/FLU/RSV plus assay is intended as an aid in the diagnosis of influenza from Nasopharyngeal swab specimens and should not be used as a sole basis for treatment. Nasal washings and aspirates are unacceptable for Xpert Xpress SARS-CoV-2/FLU/RSV testing.  Fact Sheet for Patients: [place]  Fact Sheet for Healthcare Providers: BloggerCourse.com  This test is not yet approved or cleared by the [place] FDA and has been authorized for detection and/or diagnosis of SARS-CoV-2 by FDA under an Emergency Use Authorization (  EUA). This EUA will remain in effect (meaning this test can be used) for the duration of the COVID-19 declaration under Section 564(b)(1) of the Act, 21 U.S.C. section 360bbb-3(b)(1), unless the authorization is terminated or revoked.  Performed at [facility], [address] [place], [place] [address]   CULTURE, BLOOD (ROUTINE X 2) w Reflex to ID Panel     Status: None (Preliminary result)   Collection Time: 03/17/21  4:15 PM   Specimen: BLOOD  Result Value Ref Range Status   Specimen Description BLOOD BLOOD LEFT HAND  Final   Special Requests   Final    BOTTLES DRAWN AEROBIC AND ANAEROBIC Blood Culture adequate volume   Culture   Final    NO GROWTH < 24 HOURS Performed at [facility], [address], [place], [place] [address]    Report Status PENDING  Incomplete  CULTURE, BLOOD (ROUTINE X 2) w Reflex to ID Panel     Status: None (Preliminary result)   Collection Time: 03/17/21  4:25 PM   Specimen: BLOOD  Result Value Ref Range Status   Specimen Description BLOOD BLOOD RIGHT HAND  Final   Special Requests   Final    BOTTLES DRAWN AEROBIC ONLY Blood Culture adequate volume   Culture   Final    NO GROWTH < 24 HOURS Performed at [facility], [address] [place], [place] [address]    Report Status PENDING  Incomplete   Studies/Results: DG Chest Port 1 View  Result Date: 03/17/2021 CLINICAL DATA:  Vomiting blood. EXAM: PORTABLE CHEST 1 VIEW COMPARISON:  Multiple priors, most recent January 25, 2021. FINDINGS: Similar cardiomediastinal silhouette. Calcific atherosclerosis of the aorta. Chronic interstitial changes most notable in the apices are similar to prior studies. No new consolidation. No visible pleural effusions or pneumothorax on this single AP radiograph. IMPRESSION: No definite acute cardiopulmonary disease. Chronic interstitial changes most notable in the apices are similar to prior studies. Given the patient's reported hemoptysis, a CT of the chest could provide more sensitive evaluation if clinically indicated. Electronically Signed   By: Feliberto Harts MD   On: 03/17/2021 13:49   Medications:  Scheduled Meds: . amLODipine  5 mg Oral Daily  . donepezil  5 mg Oral QHS  . insulin aspart  0-9 Units Subcutaneous Q4H    Continuous Infusions: . sodium chloride 75 mL/hr at 03/18/21 0924  . pantoprozole (PROTONIX) infusion 8 mg/hr (03/18/21 0117)   PRN Meds:.acetaminophen **OR** acetaminophen, bisacodyl, hydrALAZINE, morphine injection, ondansetron **OR** ondansetron (ZOFRAN) IV   Assessment: Active Problems:   Hematemesis    Plan: Proceed with upper endoscopy today given hematemesis reported at home, and drop in hemoglobin  PPI IV twice daily  Continue serial CBCs and transfuse PRN Avoid NSAIDs Maintain 2 large-bore IV lines Please page GI with any acute hemodynamic changes, or signs of active GI bleeding  I have discussed alternative options, risks & benefits,  which include, but are not limited to, bleeding, infection, perforation,respiratory complication & drug reaction.  The patient agrees with this plan & written consent will be obtained.      LOS: 1 day   Melodie Bouillon, MD 03/18/2021, 9:53 AM

## 2021-03-18 NOTE — Anesthesia Preprocedure Evaluation (Signed)
Anesthesia Evaluation  Patient identified by MRN, date of birth, ID band Patient awake    Reviewed: Allergy & Precautions, H&P , NPO status , Patient's Chart, lab work & pertinent test results, reviewed documented beta blocker date and time   History of Anesthesia Complications Negative for: history of anesthetic complications  Airway Mallampati: II  TM Distance: >3 FB Neck ROM: full    Dental  (+) Dental Advidsory Given, Missing   Pulmonary neg pulmonary ROS,    Pulmonary exam normal        Cardiovascular Exercise Tolerance: Poor hypertension, On Medications (-) angina(-) Past MI and (-) Cardiac Stents Normal cardiovascular exam(-) dysrhythmias (-) Valvular Problems/Murmurs Rhythm:regular Rate:Normal     Neuro/Psych Seizures -, Well Controlled,  PSYCHIATRIC DISORDERS Dementia    GI/Hepatic negative GI ROS, Neg liver ROS,   Endo/Other  diabetes  Renal/GU CRFRenal disease     Musculoskeletal   Abdominal   Peds  Hematology negative hematology ROS (+)   Anesthesia Other Findings Past Medical History: No date: Diabetes mellitus without complication (HCC) No date: Hypertension Past Surgical History: No date: THYROIDECTOMY BMI    Body Mass Index: 21.26 kg/m     Reproductive/Obstetrics negative OB ROS                             Anesthesia Physical Anesthesia Plan  ASA: III  Anesthesia Plan: General   Post-op Pain Management:    Induction: Intravenous  PONV Risk Score and Plan: 3 and TIVA and Propofol infusion  Airway Management Planned: Natural Airway and Nasal Cannula  Additional Equipment:   Intra-op Plan:   Post-operative Plan:   Informed Consent:   Plan Discussed with:   Anesthesia Plan Comments:         Anesthesia Quick Evaluation

## 2021-03-18 NOTE — Transfer of Care (Signed)
Immediate Anesthesia Transfer of Care Note  Patient: Melanie Turner  Procedure(s) Performed: ESOPHAGOGASTRODUODENOSCOPY (EGD) (N/A )  Patient Location: PACU and Endoscopy Unit  Anesthesia Type:General  Level of Consciousness: drowsy  Airway & Oxygen Therapy: Patient Spontanous Breathing  Post-op Assessment: Report given to RN  Post vital signs: stable  Last Vitals:  Vitals Value Taken Time  BP 83/55 03/18/21 1207  Temp    Pulse 86 03/18/21 1207  Resp 18 03/18/21 1207  SpO2 98 % 03/18/21 1207  Vitals shown include unvalidated device data.  Last Pain:  Vitals:   03/18/21 1203  TempSrc:   PainSc: Asleep         Complications: No complications documented.

## 2021-03-19 ENCOUNTER — Encounter: Payer: Self-pay | Admitting: Gastroenterology

## 2021-03-19 LAB — BASIC METABOLIC PANEL
Anion gap: 7 (ref 5–15)
BUN: 20 mg/dL (ref 8–23)
CO2: 26 mmol/L (ref 22–32)
Calcium: 8.5 mg/dL — ABNORMAL LOW (ref 8.9–10.3)
Chloride: 103 mmol/L (ref 98–111)
Creatinine, Ser: 0.62 mg/dL (ref 0.44–1.00)
GFR, Estimated: >60 mL/min (ref >60)
Glucose, Bld: 108 mg/dL — ABNORMAL HIGH (ref 70–99)
Potassium: 3.1 mmol/L — ABNORMAL LOW (ref 3.5–5.1)
Sodium: 136 mmol/L (ref 135–145)

## 2021-03-19 LAB — CBC
HCT: 33.1 % — ABNORMAL LOW (ref 36.0–46.0)
Hemoglobin: 10.9 g/dL — ABNORMAL LOW (ref 12.0–15.0)
MCH: 28.4 pg (ref 26.0–34.0)
MCHC: 32.9 g/dL (ref 30.0–36.0)
MCV: 86.2 fL (ref 80.0–100.0)
Platelets: 213 10*3/uL (ref 150–400)
RBC: 3.84 MIL/uL — ABNORMAL LOW (ref 3.87–5.11)
RDW: 13.2 % (ref 11.5–15.5)
WBC: 8.5 10*3/uL (ref 4.0–10.5)
nRBC: 0 % (ref 0.0–0.2)

## 2021-03-19 LAB — GLUCOSE, CAPILLARY
Glucose-Capillary: 151 mg/dL — ABNORMAL HIGH (ref 70–99)
Glucose-Capillary: 107 mg/dL — ABNORMAL HIGH (ref 70–99)
Glucose-Capillary: 183 mg/dL — ABNORMAL HIGH (ref 70–99)

## 2021-03-19 LAB — URINE CULTURE

## 2021-03-19 LAB — MAGNESIUM: Magnesium: 1.7 mg/dL (ref 1.7–2.4)

## 2021-03-19 MED ORDER — ADULT MULTIVITAMIN W/MINERALS CH: 1.0000 | ORAL_TABLET | Freq: Every day | ORAL | 1 refills | Status: DC

## 2021-03-19 MED ORDER — OMEPRAZOLE 20 MG PO CPDR: 20.0000 mg | DELAYED_RELEASE_CAPSULE | Freq: Every day | ORAL | 0 refills | Status: DC

## 2021-03-19 MED ORDER — ENSURE ENLIVE PO LIQD: 237.0000 mL | Freq: Two times a day (BID) | ORAL | 12 refills | Status: DC

## 2021-03-19 MED ORDER — OMEPRAZOLE 20 MG PO CPDR
20.0000 mg | DELAYED_RELEASE_CAPSULE | Freq: Every day | ORAL | Status: DC
  Administered 2021-03-19: 11:00:00 20 mg via ORAL
  Filled 2021-03-19: qty 1

## 2021-03-19 MED ORDER — ENSURE ENLIVE PO LIQD
237.0000 mL | Freq: Two times a day (BID) | ORAL | Status: DC
  Administered 2021-03-19: 237 mL via ORAL

## 2021-03-19 MED ORDER — PANTOPRAZOLE SODIUM 40 MG PO PACK
40.0000 mg | PACK | Freq: Every day | ORAL | Status: DC
  Filled 2021-03-19: qty 20

## 2021-03-19 MED ORDER — ADULT MULTIVITAMIN W/MINERALS CH
1.0000 | ORAL_TABLET | Freq: Every day | ORAL | Status: DC
  Administered 2021-03-19: 11:00:00 1 via ORAL
  Filled 2021-03-19: qty 1

## 2021-03-19 MED ORDER — POTASSIUM CHLORIDE CRYS ER 20 MEQ PO TBCR
40.0000 meq | EXTENDED_RELEASE_TABLET | Freq: Once | ORAL | Status: AC
  Administered 2021-03-19: 11:00:00 40 meq via ORAL
  Filled 2021-03-19: qty 2

## 2021-03-19 NOTE — Discharge Summary (Signed)
Physician Discharge Summary  Melanie Turner WOE:[id number] DOB: 01-Nov-1931 DOA: 03/17/2021  PCP: Kirk Ruths, MD  Admit date: 03/17/2021 Discharge date: 03/19/2021  Admitted From: Home Disposition: Home  Recommendations for Outpatient Follow-up:  1. Follow up with PCP in 1-2 weeks 2. Please obtain BMP/CBC in one week 3. Please follow up on the following pending results: None  Home Health: No.  Family does not wanted Equipment/Devices: Hospital bed, 3 and 1 Discharge Condition: Stable CODE STATUS: DNR Diet recommendation: Heart Healthy / Carb Modified   Brief/Interim Summary: 87 y/o F w/ PMH of dementia, DM2, HTN who presented after 2 episodes of vomiting up blood x morning of admission.Pt has never had hematemesis before. Pt does not take any NSAIDs (despite meloxicam being on pt's med rec), aspirin, anticoagulation. Pt did not have any fevers, chills, sweating, cough, chest pain, shortness of breath, abd pain, dysuria, urinary urgency, urinary frequency, diarrhea or constipation. Per GI note patient did had an EGD done in 2010 when CT abdomen was concerning for some duodenal mass.  There was no mass found, pathology results are not available for review. Patient underwent EGD with GI on 03/18/2021 which was only positive for a small hiatal hernia.  No other acute abnormality which can explain hematemesis.  Hemoglobin decreased to 10.7 during hospitalization from admission.  Patient appears very dehydrated on admission so most likely some concentrated sample initially.  No nausea, vomiting, hematemesis, melena or hematochezia noted. Patient received IV Protonix initially and discharged on omeprazole.  If she cannot take a capsule they can open up the capsule and mix the contents with applesauce or just take it with water. She will follow-up with her primary care provider for further management.  Patient initially met SIRS criteria.  Blood cultures negative, urine cultures with  multiple species and UA does not look infected.  No obvious source of infection.  Leukocytosis resolved, most likely reactive.  At baseline patient is bed and wheelchair bound.  Family has hospital bed and all the necessity equipments and does not want home health services as they were able to take care of her.  Patient was also found to have protein caloric malnutrition with BMI of 19.  We consulted dietitian and they were recommending supplementing her diet with Ensure.  She will continue rest of her home medications and follow-up with her providers.  Discharge Diagnoses:  Active Problems:   Hematemesis without nausea   Hiatal hernia   Schatzki's ring   Discharge Instructions  Discharge Instructions    Diet - low sodium heart healthy   Complete by: As directed    Discharge instructions   Complete by: As directed    It was pleasure taking care of you. Your gastroenterologist started you on omeprazole, please take it as directed and follow-up with them as an outpatient. Avoid taking ibuprofen, aspirin, Aleve or similar medications belonging to same class of NSAID. If you are unable to swallow this capsule, you can open it up and mix it with applesauce and take it with water. Keep yourself well-hydrated   Increase activity slowly   Complete by: As directed      Allergies as of 03/19/2021   No Known Allergies     Medication List    STOP taking these medications   guaiFENesin-dextromethorphan 100-10 MG/5ML syrup Commonly known as: ROBITUSSIN DM   meloxicam 7.5 MG tablet Commonly known as: MOBIC   predniSONE 10 MG tablet Commonly known as: DELTASONE     TAKE these medications  amLODipine 5 MG tablet Commonly known as: NORVASC Take 1 tablet (5 mg total) by mouth daily. What changed: Another medication with the same name was removed. Continue taking this medication, and follow the directions you see here.   blood glucose meter kit and supplies Kit Dispense based on  patient and insurance preference. Use up to four times daily as directed. (FOR ICD-9 250.00, 250.01).   clotrimazole-betamethasone cream Commonly known as: LOTRISONE Apply 1 application topically 2 (two) times daily.   DERMACLOUD EX Apply 1 application topically 2 (two) times daily. To sacrum and buttocks for nonspecific rash.   donepezil 5 MG tablet Commonly known as: ARICEPT Take 5 mg by mouth daily.   feeding supplement Liqd Take 237 mLs by mouth 2 (two) times daily between meals.   metFORMIN 500 MG 24 hr tablet Commonly known as: GLUCOPHAGE-XR Take 1,500 mg by mouth daily. With dinner.   mirtazapine 7.5 MG tablet Commonly known as: REMERON Take 7.5 mg by mouth daily.   multivitamin with minerals Tabs tablet Take 1 tablet by mouth daily.   omeprazole 20 MG capsule Commonly known as: PRILOSEC Take 1 capsule (20 mg total) by mouth daily.   sitaGLIPtin 100 MG tablet Commonly known as: JANUVIA Take 100 mg by mouth daily.   traZODone 50 MG tablet Commonly known as: DESYREL Take 50 mg by mouth at bedtime.       Follow-up Information    Kirk Ruths, MD. Schedule an appointment as soon as possible for a visit.   Specialty: Internal Medicine Contact information: [address] [place] [address] [phone]              No Known Allergies  Consultations:  GI  Procedures/Studies: DG Chest Port 1 View  Result Date: 03/17/2021 CLINICAL DATA:  Vomiting blood. EXAM: PORTABLE CHEST 1 VIEW COMPARISON:  Multiple priors, most recent January 25, 2021. FINDINGS: Similar cardiomediastinal silhouette. Calcific atherosclerosis of the aorta. Chronic interstitial changes most notable in the apices are similar to prior studies. No new consolidation. No visible pleural effusions or pneumothorax on this single AP radiograph. IMPRESSION: No definite acute cardiopulmonary disease. Chronic interstitial changes most notable in the apices  are similar to prior studies. Given the patient's reported hemoptysis, a CT of the chest could provide more sensitive evaluation if clinically indicated. Electronically Signed   By: Margaretha Sheffield MD   On: 03/17/2021 13:49     Subjective: Patient was seen and examined today.  She was alert and sitting in bed.  Denies any pain, no nausea, vomiting or diarrhea.  She is oriented to self only and smiling all the time.  Wants to go home.  Discharge Exam: Vitals:   03/19/21 0423 03/19/21 0826  BP: 130/75 (!) 141/80  Pulse: 86 72  Resp: 16 16  Temp: 98.4 F (36.9 C) 98 F (36.7 C)  SpO2: 100% 100%   Vitals:   03/18/21 1925 03/18/21 2339 03/19/21 0423 03/19/21 0826  BP: (!) 123/92 117/80 130/75 (!) 141/80  Pulse: (!) 107 94 86 72  Resp: _0 Temp: (!) 100.4 F (38 C) 98.4 F (36.9 C) 98.4 F (36.9 C) 98 F (36.7 C)  TempSrc: Oral Oral Oral   SpO2: 100% 98% 100% 100%  Weight:      Height:        General: Pt is alert, awake, not in acute distress Cardiovascular: RRR, S1/S2 +, no rubs, no gallops Respiratory: CTA bilaterally,  no wheezing, no rhonchi Abdominal: Soft, NT, ND, bowel sounds + Extremities: no edema, no cyanosis   The results of significant diagnostics from this hospitalization (including imaging, microbiology, ancillary and laboratory) are listed below for reference.    Microbiology: Recent Results (from the past 240 hour(s))  Resp Panel by RT-PCR (Flu A&B, Covid) Nasopharyngeal Swab     Status: None   Collection Time: 03/17/21  1:36 PM   Specimen: Nasopharyngeal Swab; Nasopharyngeal(NP) swabs in vial transport medium  Result Value Ref Range Status   SARS Coronavirus 2 by RT PCR NEGATIVE NEGATIVE Final    Comment: (NOTE) SARS-CoV-2 target nucleic acids are NOT DETECTED.  The SARS-CoV-2 RNA is generally detectable in upper respiratory specimens during the acute phase of infection. The lowest concentration of SARS-CoV-2 viral copies this assay can  detect is 138 copies/mL. A negative result does not preclude SARS-Cov-2 infection and should not be used as the sole basis for treatment or other patient management decisions. A negative result may occur with  improper specimen collection/handling, submission of specimen other than nasopharyngeal swab, presence of viral mutation(s) within the areas targeted by this assay, and inadequate number of viral copies(<138 copies/mL). A negative result must be combined with clinical observations, patient history, and epidemiological information. The expected result is Negative.  Fact Sheet for Patients:  EntrepreneurPulse.com.au  Fact Sheet for Healthcare Providers:  IncredibleEmployment.be  This test is no t yet approved or cleared by the [place] FDA and  has been authorized for detection and/or diagnosis of SARS-CoV-2 by FDA under an Emergency Use Authorization (EUA). This EUA will remain  in effect (meaning this test can be used) for the duration of the COVID-19 declaration under Section 564(b)(1) of the Act, 21 U.S.C.section 360bbb-3(b)(1), unless the authorization is terminated  or revoked sooner.       Influenza A by PCR NEGATIVE NEGATIVE Final   Influenza B by PCR NEGATIVE NEGATIVE Final    Comment: (NOTE) The Xpert Xpress SARS-CoV-2/FLU/RSV plus assay is intended as an aid in the diagnosis of influenza from Nasopharyngeal swab specimens and should not be used as a sole basis for treatment. Nasal washings and aspirates are unacceptable for Xpert Xpress SARS-CoV-2/FLU/RSV testing.  Fact Sheet for Patients: EntrepreneurPulse.com.au  Fact Sheet for Healthcare Providers: IncredibleEmployment.be  This test is not yet approved or cleared by the [place] FDA and has been authorized for detection and/or diagnosis of SARS-CoV-2 by FDA under an Emergency Use Authorization (EUA). This EUA will remain in  effect (meaning this test can be used) for the duration of the COVID-19 declaration under Section 564(b)(1) of the Act, 21 U.S.C. section 360bbb-3(b)(1), unless the authorization is terminated or revoked.  Performed at [facility], [address] [place], [place] [address]   CULTURE, BLOOD (ROUTINE X 2) w Reflex to ID Panel     Status: None (Preliminary result)   Collection Time: 03/17/21  4:15 PM   Specimen: BLOOD  Result Value Ref Range Status   Specimen Description BLOOD BLOOD LEFT HAND  Final   Special Requests   Final    BOTTLES DRAWN AEROBIC AND ANAEROBIC Blood Culture adequate volume   Culture   Final    NO GROWTH 2 DAYS Performed at [facility], [address] [place], [place] [address]    Report Status PENDING  Incomplete  CULTURE, BLOOD (ROUTINE X 2) w Reflex to ID Panel     Status: None (Preliminary result)   Collection Time: 03/17/21  4:25 PM  Specimen: BLOOD  Result Value Ref Range Status   Specimen Description BLOOD BLOOD RIGHT HAND  Final   Special Requests   Final    BOTTLES DRAWN AEROBIC ONLY Blood Culture adequate volume   Culture   Final    NO GROWTH 2 DAYS Performed at [facility], [address] [place], [place] [address]    Report Status PENDING  Incomplete  Urine Culture     Status: Abnormal   Collection Time: 03/17/21  6:44 PM   Specimen: Urine, Random  Result Value Ref Range Status   Specimen Description   Final    URINE, RANDOM Performed at [facility], [address] [place], [place] [address]    Special Requests   Final    NONE Performed at [facility], [address] [place], [place] [address]    Culture MULTIPLE SPECIES PRESENT, SUGGEST RECOLLECTION (A)  Final   Report Status 03/19/2021 FINAL  Final     Labs: BNP (last 3 results) No results for input(s): BNP in the last 8760 hours. Basic Metabolic Panel: Recent Labs  Lab 03/17/21 1253 03/18/21 0512 03/19/21 0741  NA 130*  138 136  K 4.1 3.5 3.1*  CL 92* 105 103  CO2 _0 GLUCOSE 408* 137* 108*  BUN 19 32* 20  CREATININE 0.85 1.09* 0.62  CALCIUM 9.4 8.7* 8.5*  MG  --   --  1.7   Liver Function Tests: Recent Labs  Lab 03/17/21 1253  AST 33  ALT 15  ALKPHOS 72  BILITOT 1.0  PROT 8.6*  ALBUMIN 4.2   Recent Labs  Lab 03/17/21 1253  LIPASE 29   No results for input(s): AMMONIA in the last 168 hours. CBC: Recent Labs  Lab 03/17/21 1253 03/18/21 0512 03/19/21 0741  WBC 10.9* 11.1* 8.5  HGB 15.0 11.7* 10.9*  HCT 45.3 34.5* 33.1*  MCV 85.0 83.9 86.2  PLT 294 238 213   Cardiac Enzymes: No results for input(s): CKTOTAL, CKMB, CKMBINDEX, TROPONINI in the last 168 hours. BNP: Invalid input(s): POCBNP CBG: Recent Labs  Lab 03/18/21 1813 03/18/21 2003 03/18/21 2340 03/19/21 0424 03/19/21 0828  GLUCAP 234* 185* 107* 151* 107*   D-Dimer No results for input(s): DDIMER in the last 72 hours. Hgb A1c No results for input(s): HGBA1C in the last 72 hours. Lipid Profile No results for input(s): CHOL, HDL, LDLCALC, TRIG, CHOLHDL, LDLDIRECT in the last 72 hours. Thyroid function studies No results for input(s): TSH, T4TOTAL, T3FREE, THYROIDAB in the last 72 hours.  Invalid input(s): FREET3 Anemia work up No results for input(s): VITAMINB12, FOLATE, FERRITIN, TIBC, IRON, RETICCTPCT in the last 72 hours. Urinalysis    Component Value Date/Time   COLORURINE YELLOW (A) 03/17/2021 1844   APPEARANCEUR HAZY (A) 03/17/2021 1844   APPEARANCEUR Clear 06/30/2014 1501   LABSPEC 1.030 03/17/2021 1844   LABSPEC 1.011 06/30/2014 1501   PHURINE 5.0 03/17/2021 1844   GLUCOSEU >=500 (A) 03/17/2021 1844   GLUCOSEU Negative 06/30/2014 1501   HGBUR NEGATIVE 03/17/2021 1844   BILIRUBINUR NEGATIVE 03/17/2021 1844   BILIRUBINUR Negative 06/30/2014 1501   KETONESUR 5 (A) 03/17/2021 1844   PROTEINUR 100 (A) 03/17/2021 1844   NITRITE NEGATIVE 03/17/2021 1844   LEUKOCYTESUR NEGATIVE 03/17/2021 1844    LEUKOCYTESUR Trace 06/30/2014 1501   Sepsis Labs Invalid input(s): PROCALCITONIN,  WBC,  LACTICIDVEN Microbiology Recent Results (from the past 240 hour(s))  Resp Panel by RT-PCR (Flu A&B, Covid) Nasopharyngeal Swab  Status: None   Collection Time: 03/17/21  1:36 PM   Specimen: Nasopharyngeal Swab; Nasopharyngeal(NP) swabs in vial transport medium  Result Value Ref Range Status   SARS Coronavirus 2 by RT PCR NEGATIVE NEGATIVE Final    Comment: (NOTE) SARS-CoV-2 target nucleic acids are NOT DETECTED.  The SARS-CoV-2 RNA is generally detectable in upper respiratory specimens during the acute phase of infection. The lowest concentration of SARS-CoV-2 viral copies this assay can detect is 138 copies/mL. A negative result does not preclude SARS-Cov-2 infection and should not be used as the sole basis for treatment or other patient management decisions. A negative result may occur with  improper specimen collection/handling, submission of specimen other than nasopharyngeal swab, presence of viral mutation(s) within the areas targeted by this assay, and inadequate number of viral copies(<138 copies/mL). A negative result must be combined with clinical observations, patient history, and epidemiological information. The expected result is Negative.  Fact Sheet for Patients:  EntrepreneurPulse.com.au  Fact Sheet for Healthcare Providers:  IncredibleEmployment.be  This test is no t yet approved or cleared by the [place] FDA and  has been authorized for detection and/or diagnosis of SARS-CoV-2 by FDA under an Emergency Use Authorization (EUA). This EUA will remain  in effect (meaning this test can be used) for the duration of the COVID-19 declaration under Section 564(b)(1) of the Act, 21 U.S.C.section 360bbb-3(b)(1), unless the authorization is terminated  or revoked sooner.       Influenza A by PCR NEGATIVE NEGATIVE Final   Influenza B by  PCR NEGATIVE NEGATIVE Final    Comment: (NOTE) The Xpert Xpress SARS-CoV-2/FLU/RSV plus assay is intended as an aid in the diagnosis of influenza from Nasopharyngeal swab specimens and should not be used as a sole basis for treatment. Nasal washings and aspirates are unacceptable for Xpert Xpress SARS-CoV-2/FLU/RSV testing.  Fact Sheet for Patients: EntrepreneurPulse.com.au  Fact Sheet for Healthcare Providers: IncredibleEmployment.be  This test is not yet approved or cleared by the [place] FDA and has been authorized for detection and/or diagnosis of SARS-CoV-2 by FDA under an Emergency Use Authorization (EUA). This EUA will remain in effect (meaning this test can be used) for the duration of the COVID-19 declaration under Section 564(b)(1) of the Act, 21 U.S.C. section 360bbb-3(b)(1), unless the authorization is terminated or revoked.  Performed at [facility], [address] [place], [place] [address]   CULTURE, BLOOD (ROUTINE X 2) w Reflex to ID Panel     Status: None (Preliminary result)   Collection Time: 03/17/21  4:15 PM   Specimen: BLOOD  Result Value Ref Range Status   Specimen Description BLOOD BLOOD LEFT HAND  Final   Special Requests   Final    BOTTLES DRAWN AEROBIC AND ANAEROBIC Blood Culture adequate volume   Culture   Final    NO GROWTH 2 DAYS Performed at [facility], [address], [place], [place] [address]    Report Status PENDING  Incomplete  CULTURE, BLOOD (ROUTINE X 2) w Reflex to ID Panel     Status: None (Preliminary result)   Collection Time: 03/17/21  4:25 PM   Specimen: BLOOD  Result Value Ref Range Status   Specimen Description BLOOD BLOOD RIGHT HAND  Final   Special Requests   Final    BOTTLES DRAWN AEROBIC ONLY Blood Culture adequate volume   Culture   Final    NO GROWTH 2 DAYS Performed at [facility], [address] [place],  [address]  Report Status  PENDING  Incomplete  Urine Culture     Status: Abnormal   Collection Time: 03/17/21  6:44 PM   Specimen: Urine, Random  Result Value Ref Range Status   Specimen Description   Final    URINE, RANDOM Performed at [facility], [address] [place], [place] [address]    Special Requests   Final    NONE Performed at [facility], [address] [place], [place] [address]    Culture MULTIPLE SPECIES PRESENT, SUGGEST RECOLLECTION (A)  Final   Report Status 03/19/2021 FINAL  Final    Time coordinating discharge: Over 30 minutes  SIGNED:  Lorella Nimrod, MD  [facility] 03/19/2021, 11:04 AM  If 7PM-7AM, please contact night-coverage [email or web address]  This record has been created using Systems analyst. Errors have been sought and corrected,but may not always be located. Such creation errors do not reflect on the standard of care.

## 2021-03-19 NOTE — Progress Notes (Signed)
Discharge paperwork reviewed with son, [place]. Verbalizes understanding of d/c plan and follow up care. All questions answered.

## 2021-03-19 NOTE — Progress Notes (Signed)
Initial Nutrition Assessment  DOCUMENTATION CODES:  Severe malnutrition in context of chronic illness  INTERVENTION:   Liberalize diet to carb consistent  Ensure Enlive po BID, each supplement provides 350 kcal and 20 grams of protein  Magic cup TID with meals, each supplement provides 290 kcal and 9 grams of protein  NUTRITION DIAGNOSIS:  Severe Malnutrition related to chronic illness (dementia) as evidenced by severe muscle depletion,severe fat depletion.  GOAL:  Patient will meet greater than or equal to 90% of their needs  MONITOR:  PO intake,Supplement acceptance,Weight trends  REASON FOR ASSESSMENT:  Consult Assessment of nutrition requirement/status  ASSESSMENT:  Pt brought to ED 5/18 after an episode of vomiting dark blood. PMH includes DM type 2, HTN, hx COVID19 infection 10/2020, and dementia  Pt resting in bed at the time of visit, RN assisting pt with breakfast. Pt pleasant and alert but unable to answer nutrition related question secondary to dementia. RN reports that pt is wheelchair bound at baseline. Will likely go home today.  5/19 - EGD, findings, A widely patent and non-obstructing Schatzki ring was found at the GE junction. A small hiatal hernia was present.  Average Meal Intake: . 5/18-5/20: 50% intake x 1 recorded meal  Relevant Medications: Scheduled Meds: . insulin aspart  0-9 Units Subcutaneous Q4H  . pantoprazole sodium  40 mg Oral Daily   Continuous Infusions: . sodium chloride 75 mL/hr at 03/19/21 0500   PRN Meds: bisacodyl, ondansetron  Labs reviewed:  K 3.1  SBG ranges from 107-261 mg/dL over the last 24 hours  HgbA1c 10.5% (1/27)  NUTRITION - FOCUSED PHYSICAL EXAM: Flowsheet Row Most Recent Value  Orbital Region Severe depletion  Upper Arm Region Severe depletion  Thoracic and Lumbar Region Moderate depletion  Buccal Region Severe depletion  Temple Region Severe depletion  Clavicle Bone Region Severe depletion  Clavicle and  Acromion Bone Region Severe depletion  Scapular Bone Region Severe depletion  Dorsal Hand Mild depletion  Patellar Region Severe depletion  Anterior Thigh Region Severe depletion  Posterior Calf Region Severe depletion  Edema (RD Assessment) None  Hair Reviewed  Eyes Reviewed  Mouth Reviewed  Skin Reviewed  Nails Reviewed     Diet Order:   Diet Order            Diet Carb Modified Fluid consistency: Thin; Room service appropriate? Yes  Diet effective now           Diet - low sodium heart healthy                EDUCATION NEEDS:  No education needs have been identified at this time  Skin:  Skin Assessment: Reviewed RN Assessment  Last BM:  unsure  Height:  Ht Readings from Last 1 Encounters:  03/18/21 5\' 2"  (1.575 m)   Weight:  Wt Readings from Last 1 Encounters:  03/18/21 48 kg    Ideal Body Weight:  50 kg  BMI:  Body mass index is 19.35 kg/m.  Estimated Nutritional Needs:   Kcal:  1600-1800 kcal/d  Protein:  80-90 g/d  Fluid:  >1600 mL/d  03/20/21, RD, LDN Clinical Dietitian Pager on Amion

## 2021-03-22 ENCOUNTER — Telehealth: Payer: Self-pay | Admitting: Gastroenterology

## 2021-03-22 LAB — CULTURE, BLOOD (ROUTINE X 2)
Culture: NO GROWTH
Culture: NO GROWTH
Special Requests: ADEQUATE
Special Requests: ADEQUATE

## 2021-03-22 NOTE — Telephone Encounter (Signed)
Schedule follow-up appointment Pasty Spillers, MD  Elicia Lamp I Please set up clinic appointment with me in 4-6 weeks for hospital follow up    Called LVM to call back to schedule 4-6 week f/u

## 2021-05-07 IMAGING — CT CT CTA ABD/PEL W/CM AND/OR W/O CM
2 of 9 series · 12 of 46 positions shown, 17 images · IV contrast (omnipaque)
Comparison: Prior CT of the abdomen and pelvis with contrast on
11/13/2008

CLINICAL DATA: Mental status changes and vomiting. Elevated lactate
and possible mesenteric ischemia versus sepsis.

EXAM:
CT ANGIOGRAPHY ABDOMEN AND PELVIS WITH CONTRAST
TECHNIQUE: Multidetector CT imaging of the abdomen and pelvis was performed
using the standard protocol during bolus administration of
intravenous contrast. Multiplanar reconstructed images and MIPs were
obtained and reviewed to evaluate the vascular anatomy.
CONTRAST:  75mL OMNIPAQUE IOHEXOL 350 MG/ML SOLN

[Series 7: axial venous (person_name) · axial · portal-venous · 0.68mm/px · z∈[-453,-103]mm · 10 of 207 slices shown, 15 images]
[im 16/207  soft-tissue]
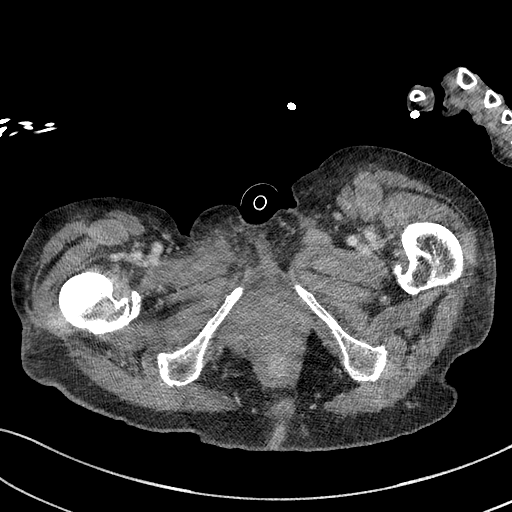
[im 16/207  bone]
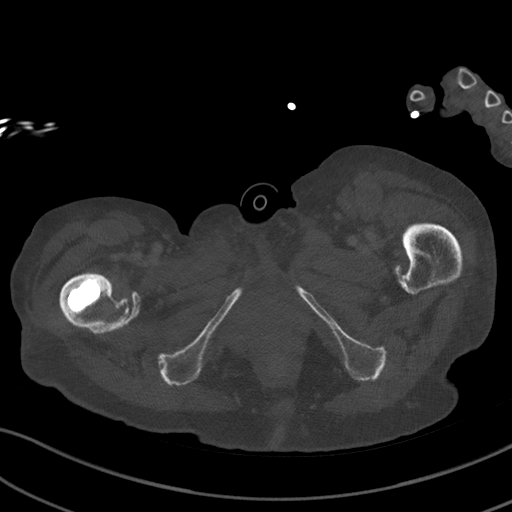
[im 48/207  soft-tissue]
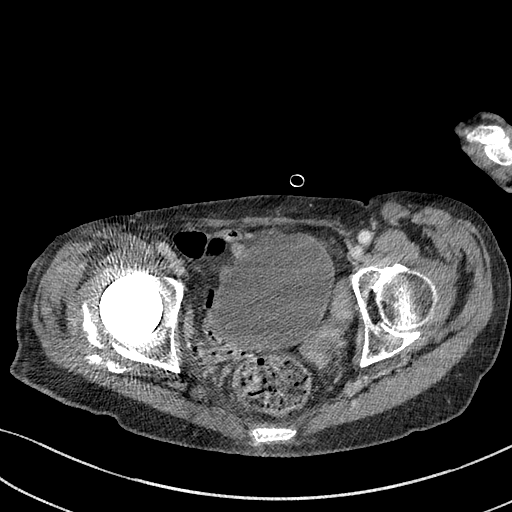
[im 64/207  soft-tissue]
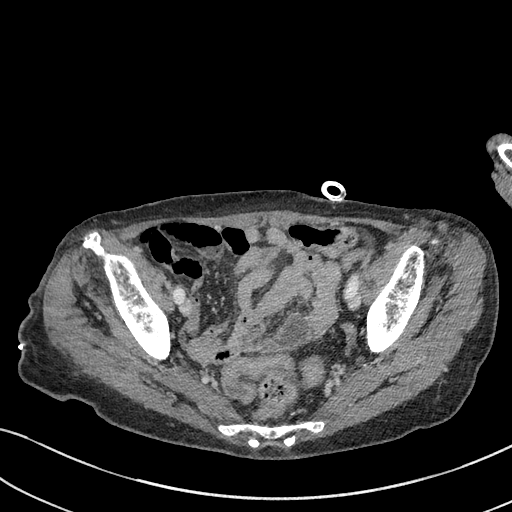
[im 80/207  soft-tissue]
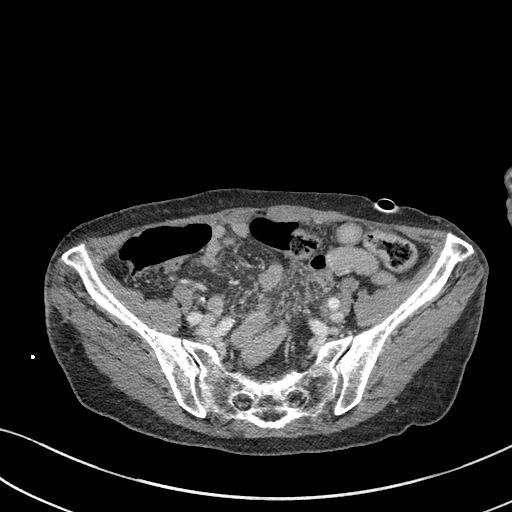
[im 111/207  soft-tissue]
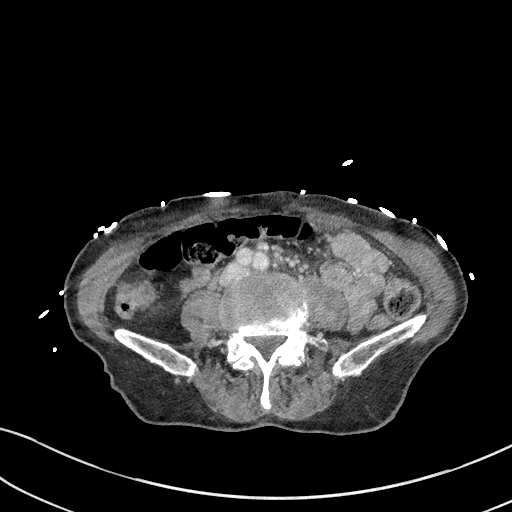
[im 127/207  soft-tissue]
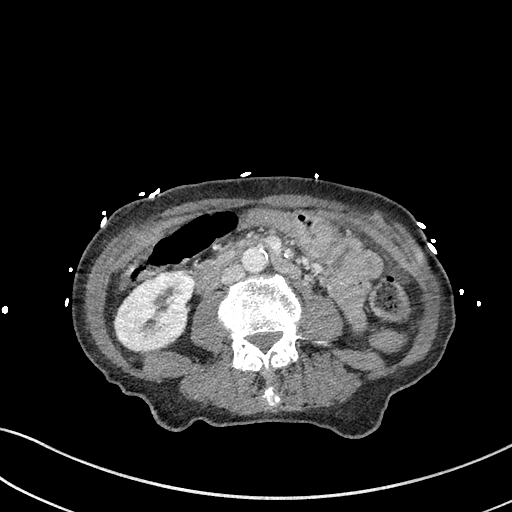
[im 143/207  soft-tissue]
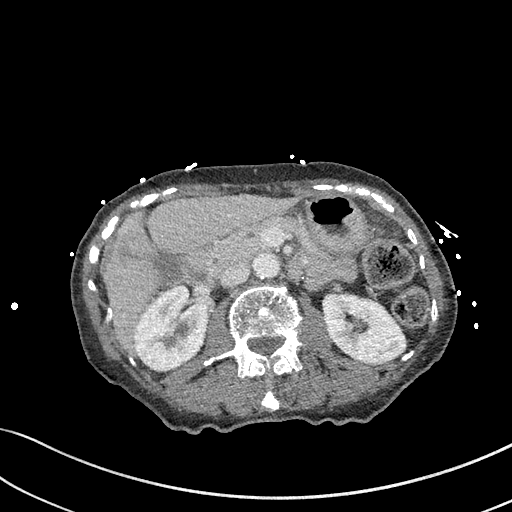
[im 143/207  lung]
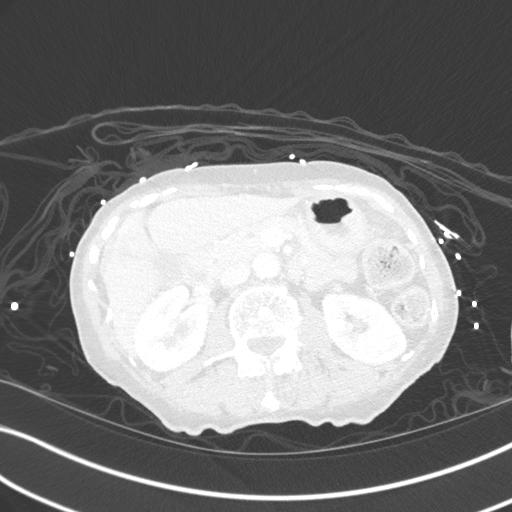
[im 159/207  lung]
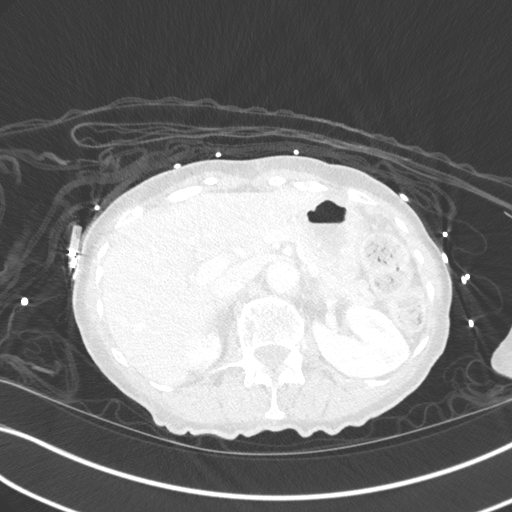
[im 175/207  soft-tissue]
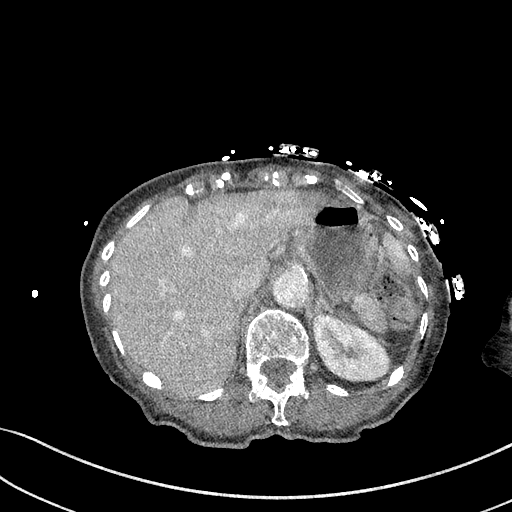
[im 175/207  lung]
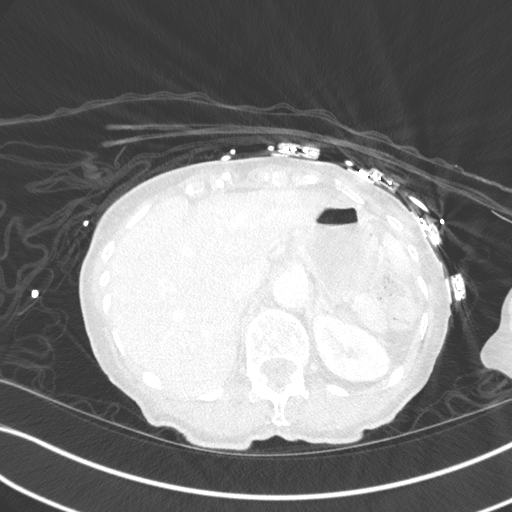
[im 191/207  soft-tissue]
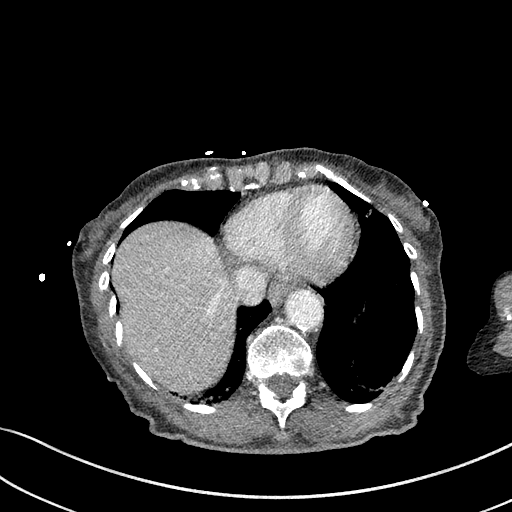
[im 191/207  lung]
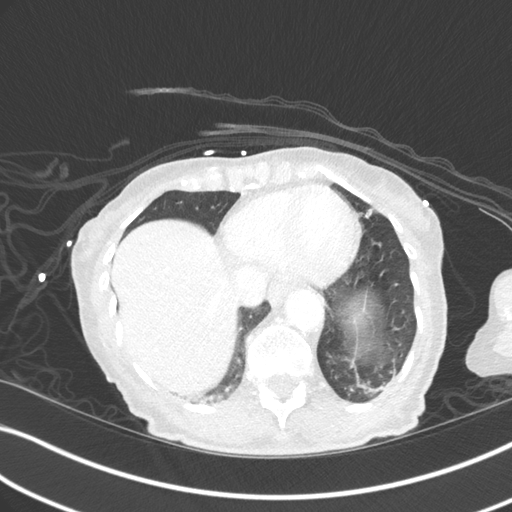
[im 191/207  bone]
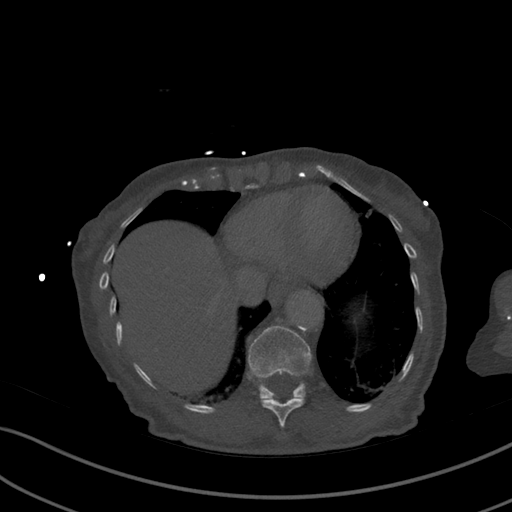

[Series 9: coronal mpr · coronal · 0.62mm/px · 2 of 113 slices shown]
[im 38/113  soft-tissue]
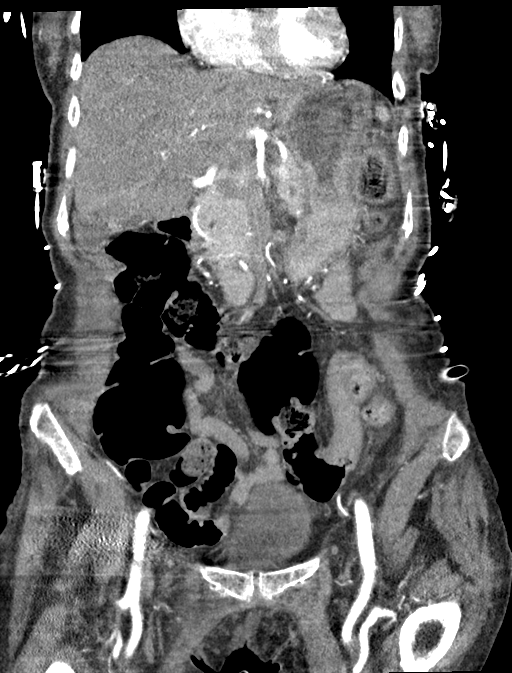
[im 75/113  soft-tissue]
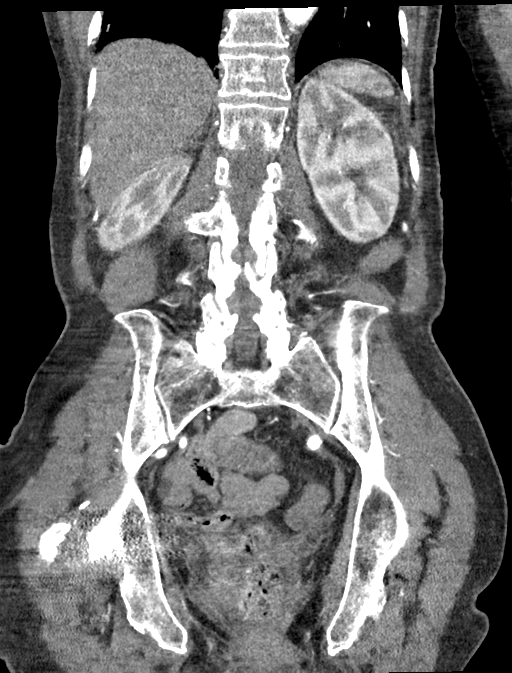

[12 of 46 positions shown; findings below may reference images not displayed]

FINDINGS: VASCULAR

Aorta: Mild atherosclerosis without evidence of aneurysm, dissection
or stenosis.

Celiac: Mild calcified plaque at the origin of the celiac axis
without stenosis. Distal branches are normally patent and
demonstrate normal branching anatomy.

SMA: Mild calcified plaque at the SMA origin without evidence of
stenosis. Visualized distal branches demonstrate normal patency.

Renals: Normally patent bilateral single renal arteries.

IMA: Normally patent.

Inflow: Normally patent bilateral iliac arteries.

Proximal Outflow: Normally patent bilateral common femoral arteries
and femoral bifurcations.

Veins: Venous phase imaging demonstrates normal patency of
visualized mesenteric veins, portal vein, splenic vein, renal veins,
IVC, iliac veins and common femoral veins.

Review of the MIP images confirms the above findings.

NON-VASCULAR

Lower chest: No acute abnormality.  Bibasilar atelectasis/scarring.

Hepatobiliary: No focal liver abnormality is seen. No gallstones,
gallbladder wall thickening, or biliary dilatation.

Pancreas: Unremarkable. No pancreatic ductal dilatation or
surrounding inflammatory changes.

Spleen: Normal in size without focal abnormality.

Adrenals/Urinary Tract: Adrenal glands are unremarkable. Kidneys are
normal, without renal calculi, focal lesion, or hydronephrosis.
Bladder is unremarkable.

Stomach/Bowel: Visualized bowel demonstrates no evidence of
obstruction, ileus, inflammation or lesion. No free air identified.

Lymphatic: No enlarged abdominal or pelvic lymph nodes.

Reproductive: Prostate is unremarkable.

Other: No ascites or focal abscess.  No hernias identified.

Musculoskeletal: No acute or significant osseous findings.
IMPRESSION: 1. No evidence of significant mesenteric arterial occlusive disease.
2. No acute findings in the abdomen or pelvis.
3. Mild atherosclerosis of the abdominal aorta.

Aortic Atherosclerosis (XF4TY-YOL.L).

## 2021-05-07 IMAGING — DX DG CHEST 1V PORT
1 series · 1 of 1 positions shown · non-contrast
Comparison: Portable chest 10/10/2020 and earlier.

CLINICAL DATA: 88-year-old female with possible sepsis. Altered
mental status.

EXAM:
PORTABLE CHEST 1 VIEW

[chest ap]
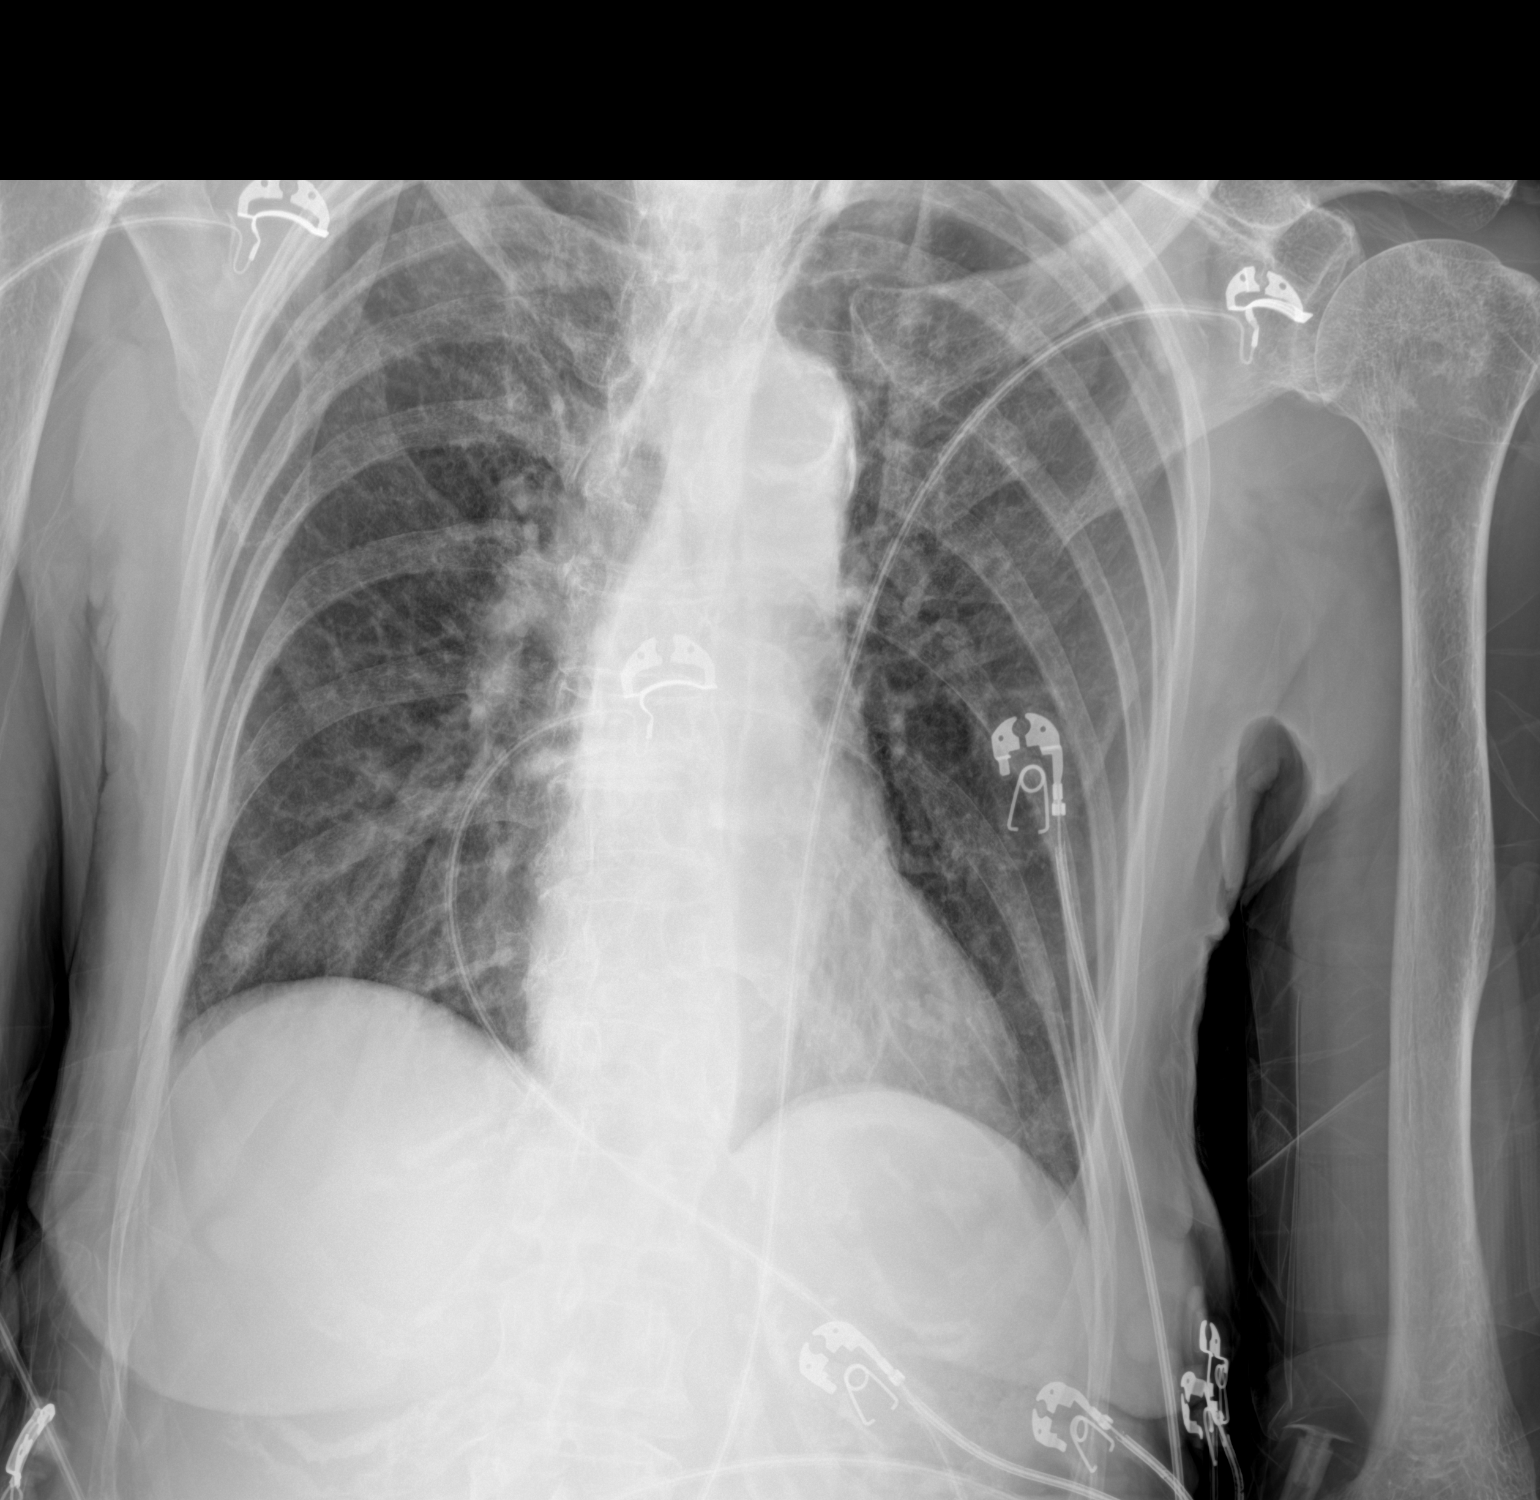

[1 of 1 positions shown; findings below may reference images not displayed]

FINDINGS: Portable AP upright view at 1577 hours. Lung volumes and mediastinal
contours are stable and within normal limits. Calcified aortic
atherosclerosis. Visualized tracheal air column is within normal
limits. Allowing for portable technique the lungs are clear. No
pneumothorax or pleural effusion. No acute osseous abnormality
identified.

Paucity of bowel gas in the upper abdomen.
IMPRESSION: No acute cardiopulmonary abnormality.

## 2021-07-08 IMAGING — CR DG CHEST 2V
1 series · 2 of 2 positions shown · non-contrast
Comparison: None.

CLINICAL DATA: Productive cough.

EXAM:
CHEST - 2 VIEW

[Series 1: dg chest 2 view · 0.14mm/px · 2 of 2 slices shown]
[im 1/2]
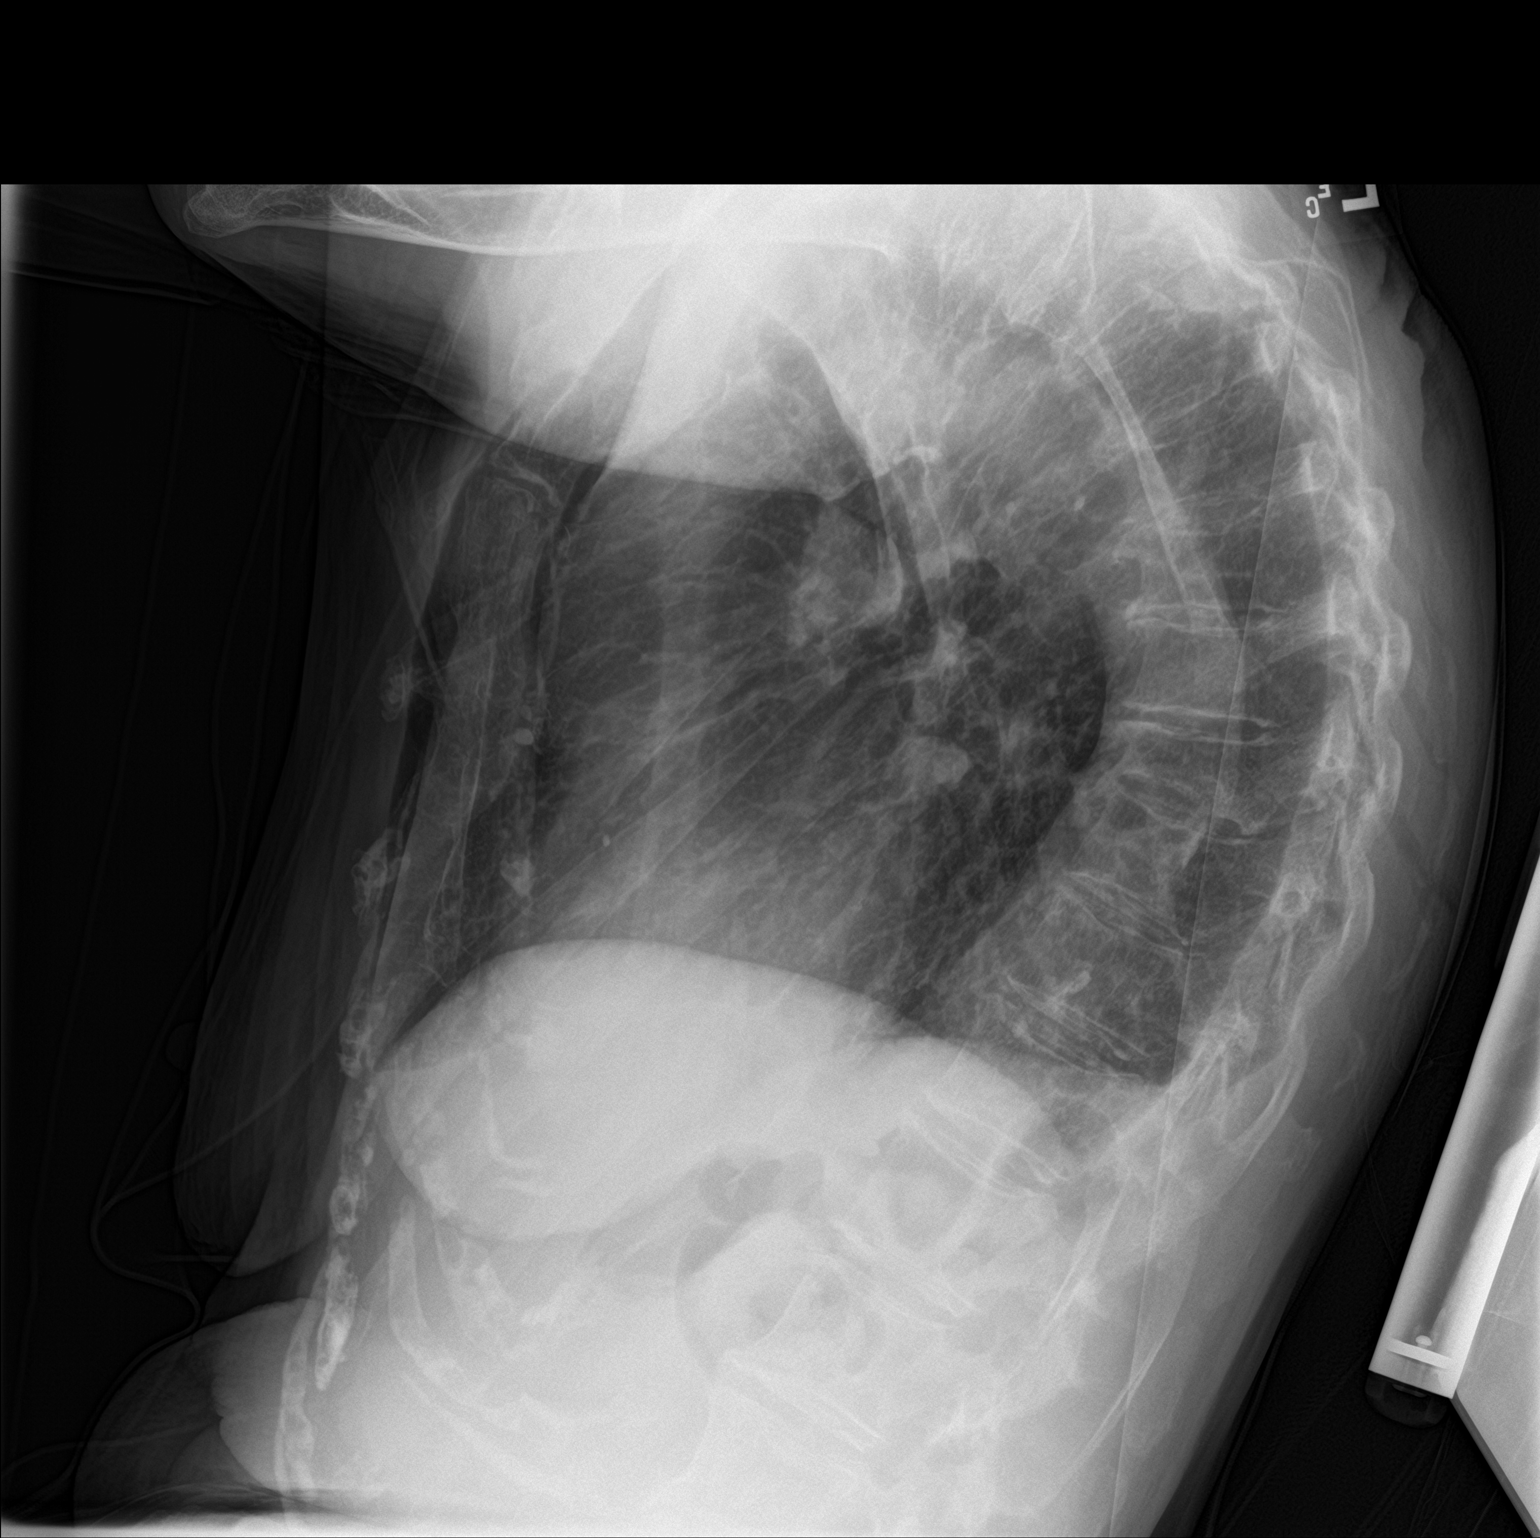
[im 2/2]
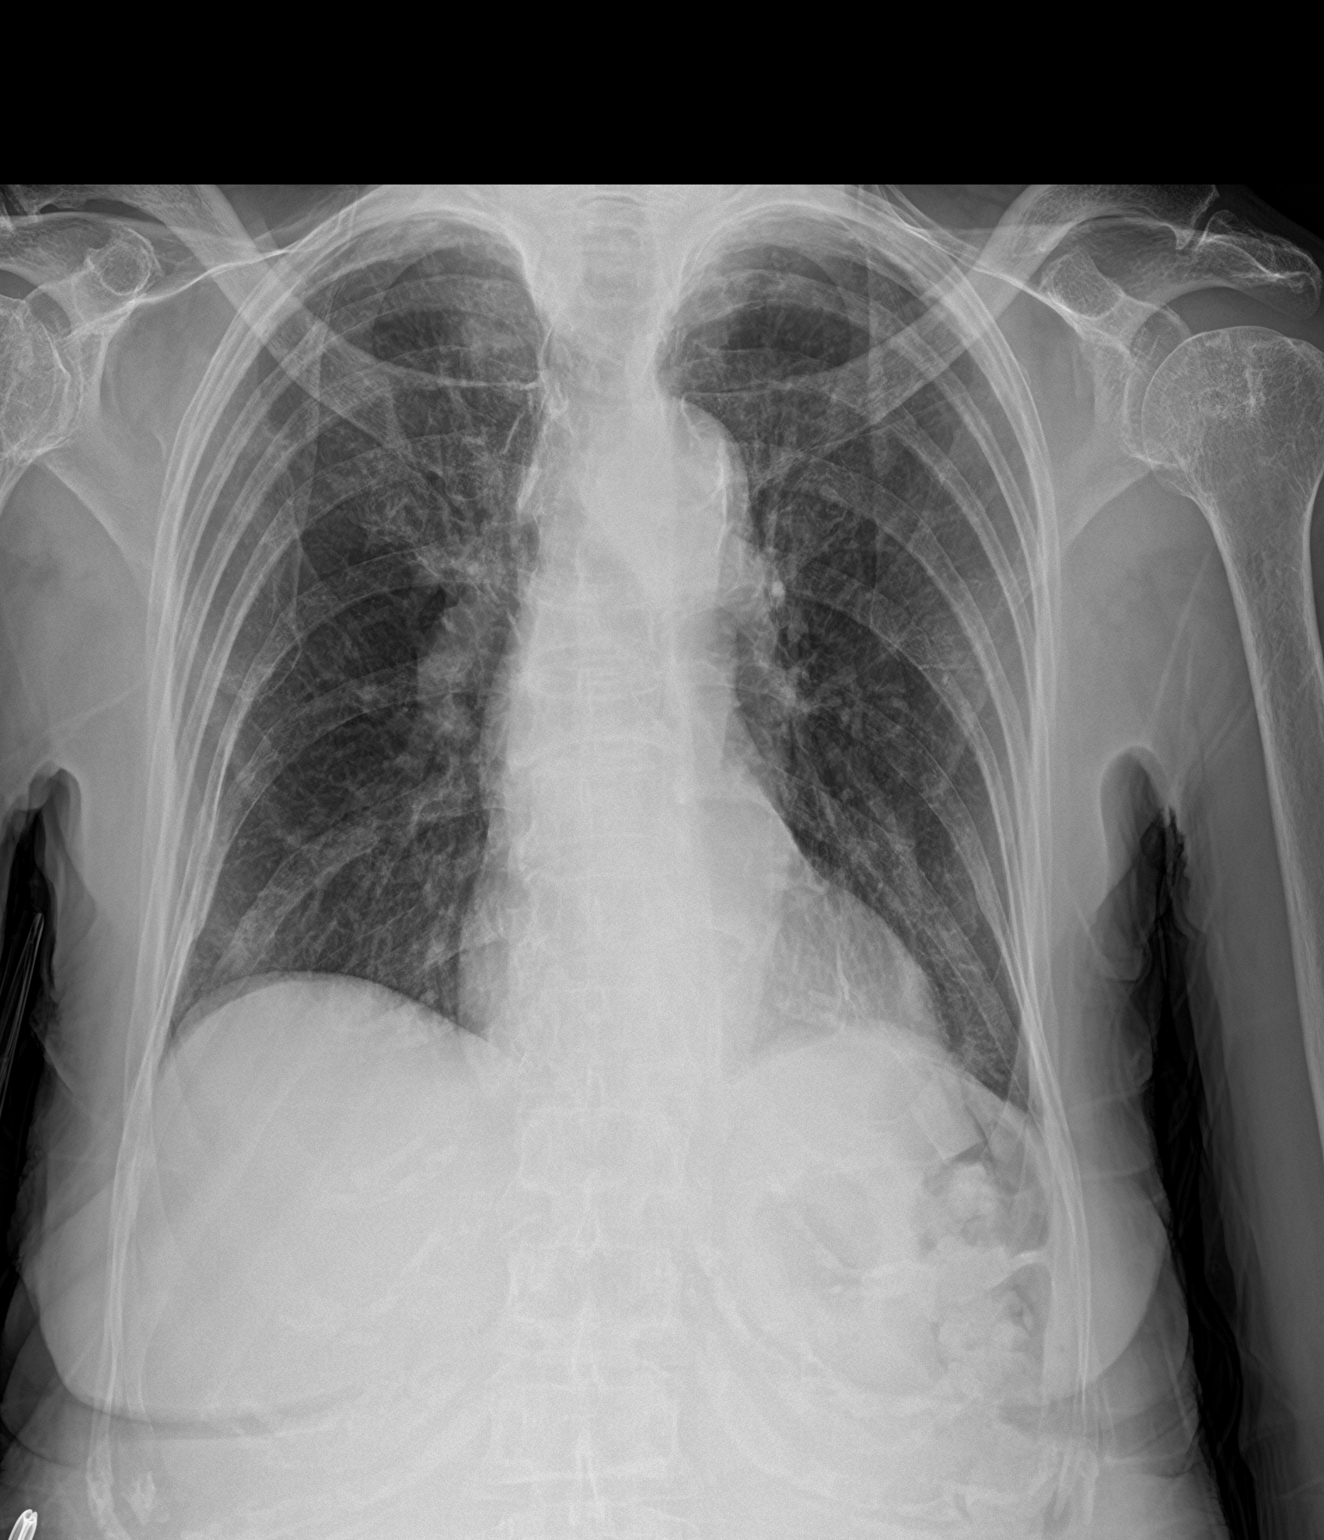

[2 of 2 positions shown; findings below may reference images not displayed]

FINDINGS: The heart size and mediastinal contours are within normal limits.
Both lungs are clear. The visualized skeletal structures are
unremarkable.
IMPRESSION: No active cardiopulmonary disease.

Aortic Atherosclerosis ([id number]-[id number].N).

## 2023-01-13 ENCOUNTER — Encounter: Payer: Self-pay | Admitting: Emergency Medicine

## 2023-01-13 ENCOUNTER — Emergency Department: Payer: Medicare HMO

## 2023-01-13 ENCOUNTER — Inpatient Hospital Stay
Admission: EM | Admit: 2023-01-13 | Discharge: 2023-01-22 | DRG: 871 | Disposition: A | Discharge status: Home / Self Care | Payer: Medicare HMO | Attending: Osteopathic Medicine | Admitting: Osteopathic Medicine

## 2023-01-13 DIAGNOSIS — R509 Fever, unspecified: Secondary | ICD-10-CM | POA: Diagnosis not present

## 2023-01-13 DIAGNOSIS — N281 Cyst of kidney, acquired: Secondary | ICD-10-CM | POA: Diagnosis not present

## 2023-01-13 DIAGNOSIS — A419 Sepsis, unspecified organism: Secondary | ICD-10-CM | POA: Diagnosis not present

## 2023-01-13 DIAGNOSIS — L89153 Pressure ulcer of sacral region, stage 3: Secondary | ICD-10-CM | POA: Diagnosis present

## 2023-01-13 DIAGNOSIS — E872 Acidosis, unspecified: Secondary | ICD-10-CM | POA: Diagnosis present

## 2023-01-13 DIAGNOSIS — K449 Diaphragmatic hernia without obstruction or gangrene: Secondary | ICD-10-CM | POA: Diagnosis present

## 2023-01-13 DIAGNOSIS — Z79899 Other long term (current) drug therapy: Secondary | ICD-10-CM

## 2023-01-13 DIAGNOSIS — J9811 Atelectasis: Secondary | ICD-10-CM | POA: Diagnosis not present

## 2023-01-13 DIAGNOSIS — E890 Postprocedural hypothyroidism: Secondary | ICD-10-CM | POA: Diagnosis present

## 2023-01-13 DIAGNOSIS — Z1152 Encounter for screening for COVID-19: Secondary | ICD-10-CM

## 2023-01-13 DIAGNOSIS — J189 Pneumonia, unspecified organism: Secondary | ICD-10-CM | POA: Diagnosis not present

## 2023-01-13 DIAGNOSIS — I700 Atherosclerosis of aorta: Secondary | ICD-10-CM | POA: Diagnosis not present

## 2023-01-13 DIAGNOSIS — L89510 Pressure ulcer of right ankle, unstageable: Secondary | ICD-10-CM | POA: Diagnosis present

## 2023-01-13 DIAGNOSIS — Z7401 Bed confinement status: Secondary | ICD-10-CM | POA: Diagnosis not present

## 2023-01-13 DIAGNOSIS — J90 Pleural effusion, not elsewhere classified: Secondary | ICD-10-CM | POA: Diagnosis not present

## 2023-01-13 DIAGNOSIS — Z8616 Personal history of COVID-19: Secondary | ICD-10-CM | POA: Diagnosis not present

## 2023-01-13 DIAGNOSIS — F0390 Unspecified dementia without behavioral disturbance: Secondary | ICD-10-CM | POA: Diagnosis present

## 2023-01-13 DIAGNOSIS — E1165 Type 2 diabetes mellitus with hyperglycemia: Secondary | ICD-10-CM | POA: Diagnosis present

## 2023-01-13 DIAGNOSIS — Z7984 Long term (current) use of oral hypoglycemic drugs: Secondary | ICD-10-CM

## 2023-01-13 DIAGNOSIS — R739 Hyperglycemia, unspecified: Secondary | ICD-10-CM | POA: Diagnosis not present

## 2023-01-13 DIAGNOSIS — L89892 Pressure ulcer of other site, stage 2: Secondary | ICD-10-CM | POA: Diagnosis present

## 2023-01-13 DIAGNOSIS — R64 Cachexia: Secondary | ICD-10-CM | POA: Diagnosis present

## 2023-01-13 DIAGNOSIS — J9601 Acute respiratory failure with hypoxia: Secondary | ICD-10-CM | POA: Diagnosis not present

## 2023-01-13 DIAGNOSIS — R109 Unspecified abdominal pain: Secondary | ICD-10-CM | POA: Diagnosis not present

## 2023-01-13 DIAGNOSIS — E43 Unspecified severe protein-calorie malnutrition: Secondary | ICD-10-CM | POA: Diagnosis not present

## 2023-01-13 DIAGNOSIS — I10 Essential (primary) hypertension: Secondary | ICD-10-CM | POA: Diagnosis present

## 2023-01-13 DIAGNOSIS — Z96641 Presence of right artificial hip joint: Secondary | ICD-10-CM | POA: Diagnosis present

## 2023-01-13 DIAGNOSIS — Z66 Do not resuscitate: Secondary | ICD-10-CM | POA: Diagnosis not present

## 2023-01-13 DIAGNOSIS — F02B0 Dementia in other diseases classified elsewhere, moderate, without behavioral disturbance, psychotic disturbance, mood disturbance, and anxiety: Secondary | ICD-10-CM | POA: Diagnosis not present

## 2023-01-13 DIAGNOSIS — J918 Pleural effusion in other conditions classified elsewhere: Secondary | ICD-10-CM | POA: Diagnosis present

## 2023-01-13 DIAGNOSIS — R600 Localized edema: Secondary | ICD-10-CM | POA: Diagnosis not present

## 2023-01-13 DIAGNOSIS — Z681 Body mass index (BMI) 19 or less, adult: Secondary | ICD-10-CM

## 2023-01-13 DIAGNOSIS — R000 Tachycardia, unspecified: Secondary | ICD-10-CM | POA: Diagnosis not present

## 2023-01-13 DIAGNOSIS — E860 Dehydration: Secondary | ICD-10-CM | POA: Diagnosis present

## 2023-01-13 DIAGNOSIS — L89893 Pressure ulcer of other site, stage 3: Secondary | ICD-10-CM | POA: Diagnosis present

## 2023-01-13 DIAGNOSIS — R6520 Severe sepsis without septic shock: Secondary | ICD-10-CM | POA: Diagnosis present

## 2023-01-13 DIAGNOSIS — R531 Weakness: Secondary | ICD-10-CM | POA: Diagnosis not present

## 2023-01-13 DIAGNOSIS — Z515 Encounter for palliative care: Secondary | ICD-10-CM | POA: Diagnosis not present

## 2023-01-13 DIAGNOSIS — G309 Alzheimer's disease, unspecified: Secondary | ICD-10-CM | POA: Diagnosis not present

## 2023-01-13 DIAGNOSIS — E876 Hypokalemia: Secondary | ICD-10-CM | POA: Diagnosis not present

## 2023-01-13 LAB — COMPREHENSIVE METABOLIC PANEL
ALT: 18 U/L (ref 0–44)
AST: 34 U/L (ref 15–41)
Albumin: 3.2 g/dL — ABNORMAL LOW (ref 3.5–5.0)
Alkaline Phosphatase: 74 U/L (ref 38–126)
Anion gap: 13 (ref 5–15)
BUN: 17 mg/dL (ref 8–23)
CO2: 26 mmol/L (ref 22–32)
Calcium: 8.8 mg/dL — ABNORMAL LOW (ref 8.9–10.3)
Chloride: 92 mmol/L — ABNORMAL LOW (ref 98–111)
Creatinine, Ser: 0.62 mg/dL (ref 0.44–1.00)
GFR, Estimated: >60 mL/min (ref >60)
Glucose, Bld: 415 mg/dL — ABNORMAL HIGH (ref 70–99)
Potassium: 3.6 mmol/L (ref 3.5–5.1)
Sodium: 131 mmol/L — ABNORMAL LOW (ref 135–145)
Total Bilirubin: 1.1 mg/dL (ref 0.3–1.2)
Total Protein: 8.3 g/dL — ABNORMAL HIGH (ref 6.5–8.1)

## 2023-01-13 LAB — URINALYSIS, ROUTINE W REFLEX MICROSCOPIC
Bacteria, UA: NONE SEEN
Bilirubin Urine: NEGATIVE
Glucose, UA: >=500 mg/dL — AB
Hgb urine dipstick: NEGATIVE
Ketones, ur: 5 mg/dL — AB
Leukocytes,Ua: NEGATIVE
Nitrite: NEGATIVE
Protein, ur: 30 mg/dL — AB
Specific Gravity, Urine: 1.028 (ref 1.005–1.030)
Squamous Epithelial / HPF: NONE SEEN /HPF (ref 0–5)
pH: 6 (ref 5.0–8.0)

## 2023-01-13 LAB — CBC WITH DIFFERENTIAL/PLATELET
Abs Immature Granulocytes: 0.04 10*3/uL (ref 0.00–0.07)
Basophils Absolute: 0 10*3/uL (ref 0.0–0.1)
Basophils Relative: 0 %
Eosinophils Absolute: 0 10*3/uL (ref 0.0–0.5)
Eosinophils Relative: 0 %
HCT: 38.4 % (ref 36.0–46.0)
Hemoglobin: 12.7 g/dL (ref 12.0–15.0)
Immature Granulocytes: 0 %
Lymphocytes Relative: 6 %
Lymphs Abs: 0.6 10*3/uL — ABNORMAL LOW (ref 0.7–4.0)
MCH: 28.2 pg (ref 26.0–34.0)
MCHC: 33.1 g/dL (ref 30.0–36.0)
MCV: 85.3 fL (ref 80.0–100.0)
Monocytes Absolute: 0.4 10*3/uL (ref 0.1–1.0)
Monocytes Relative: 4 %
Neutro Abs: 9.3 10*3/uL — ABNORMAL HIGH (ref 1.7–7.7)
Neutrophils Relative %: 90 %
Platelets: 411 10*3/uL — ABNORMAL HIGH (ref 150–400)
RBC: 4.5 MIL/uL (ref 3.87–5.11)
RDW: 13.1 % (ref 11.5–15.5)
WBC: 10.3 10*3/uL (ref 4.0–10.5)
nRBC: 0 % (ref 0.0–0.2)

## 2023-01-13 LAB — CBC
HCT: 37.5 % (ref 36.0–46.0)
Hemoglobin: 12.6 g/dL (ref 12.0–15.0)
MCH: 28.3 pg (ref 26.0–34.0)
MCHC: 33.6 g/dL (ref 30.0–36.0)
MCV: 84.3 fL (ref 80.0–100.0)
Platelets: 318 10*3/uL (ref 150–400)
RBC: 4.45 MIL/uL (ref 3.87–5.11)
RDW: 13.1 % (ref 11.5–15.5)
WBC: 12.6 10*3/uL — ABNORMAL HIGH (ref 4.0–10.5)
nRBC: 0 % (ref 0.0–0.2)

## 2023-01-13 LAB — PREALBUMIN: Prealbumin: 12 mg/dL — ABNORMAL LOW (ref 18–38)

## 2023-01-13 LAB — HEMOGLOBIN A1C
Hgb A1c MFr Bld: 10.7 % — ABNORMAL HIGH (ref 4.8–5.6)
Mean Plasma Glucose: 260 mg/dL

## 2023-01-13 LAB — RESP PANEL BY RT-PCR (RSV, FLU A&B, COVID)  RVPGX2
Influenza A by PCR: NEGATIVE
Influenza B by PCR: NEGATIVE
Resp Syncytial Virus by PCR: NEGATIVE
SARS Coronavirus 2 by RT PCR: NEGATIVE

## 2023-01-13 LAB — STREP PNEUMONIAE URINARY ANTIGEN: Strep Pneumo Urinary Antigen: NEGATIVE

## 2023-01-13 LAB — GLUCOSE, CAPILLARY
Glucose-Capillary: 113 mg/dL — ABNORMAL HIGH (ref 70–99)
Glucose-Capillary: 99 mg/dL (ref 70–99)
Glucose-Capillary: 157 mg/dL — ABNORMAL HIGH (ref 70–99)

## 2023-01-13 LAB — CBG MONITORING, ED
Glucose-Capillary: 414 mg/dL — ABNORMAL HIGH (ref 70–99)
Glucose-Capillary: 309 mg/dL — ABNORMAL HIGH (ref 70–99)

## 2023-01-13 LAB — TROPONIN I (HIGH SENSITIVITY)
Troponin I (High Sensitivity): <2 ng/L (ref <18)
Troponin I (High Sensitivity): 3 ng/L (ref <18)

## 2023-01-13 LAB — LACTIC ACID, PLASMA
Lactic Acid, Venous: 3.9 mmol/L (ref 0.5–1.9)
Lactic Acid, Venous: 4.2 mmol/L (ref 0.5–1.9)
Lactic Acid, Venous: 3.9 mmol/L (ref 0.5–1.9)
Lactic Acid, Venous: 2.6 mmol/L (ref 0.5–1.9)

## 2023-01-13 LAB — CREATININE, SERUM
Creatinine, Ser: 0.47 mg/dL (ref 0.44–1.00)
GFR, Estimated: >60 mL/min (ref >60)

## 2023-01-13 LAB — LIPASE, BLOOD: Lipase: 44 U/L (ref 11–51)

## 2023-01-13 MED ORDER — LACTATED RINGERS IV BOLUS
1000.0000 mL | Freq: Once | INTRAVENOUS | Status: AC
  Administered 2023-01-13: 1000 mL via INTRAVENOUS

## 2023-01-13 MED ORDER — SODIUM CHLORIDE 0.9 % IV SOLN
2.0000 g | Freq: Once | INTRAVENOUS | Status: AC
  Administered 2023-01-13: 2 g via INTRAVENOUS
  Filled 2023-01-13: qty 20

## 2023-01-13 MED ORDER — LACTATED RINGERS IV SOLN: INTRAVENOUS | Status: AC

## 2023-01-13 MED ORDER — SODIUM CHLORIDE 0.9 % IV SOLN
2.0000 g | INTRAVENOUS | Status: AC
  Administered 2023-01-14 – 2023-01-18 (×5): 2 g via INTRAVENOUS
  Filled 2023-01-13 (×3): qty 2
  Filled 2023-01-13: qty 20
  Filled 2023-01-13: qty 2

## 2023-01-13 MED ORDER — SODIUM CHLORIDE 0.9 % IV SOLN
500.0000 mg | Freq: Once | INTRAVENOUS | Status: AC
  Administered 2023-01-13: 500 mg via INTRAVENOUS
  Filled 2023-01-13: qty 5

## 2023-01-13 MED ORDER — LACTATED RINGERS IV BOLUS
500.0000 mL | Freq: Once | INTRAVENOUS | Status: AC
  Administered 2023-01-13: 500 mL via INTRAVENOUS

## 2023-01-13 MED ORDER — INSULIN ASPART 100 UNIT/ML IJ SOLN
0.0000 [IU] | INTRAMUSCULAR | Status: DC
  Administered 2023-01-13: 2 [IU] via SUBCUTANEOUS
  Administered 2023-01-14: 1 [IU] via SUBCUTANEOUS
  Administered 2023-01-16: 5 [IU] via SUBCUTANEOUS
  Administered 2023-01-16: 2 [IU] via SUBCUTANEOUS
  Administered 2023-01-17 (×2): 3 [IU] via SUBCUTANEOUS
  Administered 2023-01-17: 2 [IU] via SUBCUTANEOUS
  Administered 2023-01-17: 3 [IU] via SUBCUTANEOUS
  Administered 2023-01-18 (×3): 2 [IU] via SUBCUTANEOUS
  Administered 2023-01-18: 1 [IU] via SUBCUTANEOUS
  Administered 2023-01-18: 3 [IU] via SUBCUTANEOUS
  Administered 2023-01-19: 1 [IU] via SUBCUTANEOUS
  Administered 2023-01-19: 5 [IU] via SUBCUTANEOUS
  Administered 2023-01-19: 3 [IU] via SUBCUTANEOUS
  Administered 2023-01-19: 5 [IU] via SUBCUTANEOUS
  Administered 2023-01-19 (×2): 2 [IU] via SUBCUTANEOUS
  Administered 2023-01-20: 1 [IU] via SUBCUTANEOUS
  Administered 2023-01-20: 3 [IU] via SUBCUTANEOUS
  Administered 2023-01-20 (×2): 2 [IU] via SUBCUTANEOUS
  Administered 2023-01-21: 1 [IU] via SUBCUTANEOUS
  Administered 2023-01-21 (×2): 2 [IU] via SUBCUTANEOUS
  Administered 2023-01-21 – 2023-01-22 (×5): 3 [IU] via SUBCUTANEOUS
  Administered 2023-01-22: 1 [IU] via SUBCUTANEOUS
  Administered 2023-01-22: 3 [IU] via SUBCUTANEOUS
  Filled 2023-01-13 (×31): qty 1

## 2023-01-13 MED ORDER — IOHEXOL 300 MG/ML  SOLN
75.0000 mL | Freq: Once | INTRAMUSCULAR | Status: AC | PRN
  Administered 2023-01-13: 75 mL via INTRAVENOUS

## 2023-01-13 MED ORDER — VITAMIN C 500 MG PO TABS: 500.0000 mg | ORAL_TABLET | Freq: Two times a day (BID) | ORAL | Status: DC

## 2023-01-13 MED ORDER — ENOXAPARIN SODIUM 30 MG/0.3ML IJ SOSY
30.0000 mg | PREFILLED_SYRINGE | INTRAMUSCULAR | Status: DC
  Administered 2023-01-13 – 2023-01-21 (×9): 30 mg via SUBCUTANEOUS
  Filled 2023-01-13 (×9): qty 0.3

## 2023-01-13 MED ORDER — ZINC SULFATE 220 (50 ZN) MG PO CAPS: 220.0000 mg | ORAL_CAPSULE | Freq: Every day | ORAL | Status: DC

## 2023-01-13 MED ORDER — ENSURE ENLIVE PO LIQD
237.0000 mL | Freq: Three times a day (TID) | ORAL | Status: DC
  Administered 2023-01-13 – 2023-01-22 (×6): 237 mL via ORAL

## 2023-01-13 MED ORDER — INSULIN ASPART 100 UNIT/ML IJ SOLN
0.0000 [IU] | INTRAMUSCULAR | Status: DC
  Administered 2023-01-13: 11 [IU] via SUBCUTANEOUS
  Filled 2023-01-13: qty 1

## 2023-01-13 MED ORDER — HYDRALAZINE HCL 20 MG/ML IJ SOLN
10.0000 mg | INTRAMUSCULAR | Status: DC | PRN
  Administered 2023-01-15 – 2023-01-19 (×3): 10 mg via INTRAVENOUS
  Filled 2023-01-13 (×4): qty 1

## 2023-01-13 MED ORDER — ONDANSETRON HCL 4 MG PO TABS: 4.0000 mg | ORAL_TABLET | Freq: Four times a day (QID) | ORAL | Status: DC | PRN

## 2023-01-13 MED ORDER — SODIUM CHLORIDE 0.9 % IV SOLN
500.0000 mg | INTRAVENOUS | Status: AC
  Administered 2023-01-14 – 2023-01-18 (×5): 500 mg via INTRAVENOUS
  Filled 2023-01-13: qty 500
  Filled 2023-01-13: qty 5
  Filled 2023-01-13 (×3): qty 500
  Filled 2023-01-13 (×2): qty 5

## 2023-01-13 MED ORDER — ONDANSETRON HCL 4 MG/2ML IJ SOLN: 4.0000 mg | Freq: Four times a day (QID) | INTRAMUSCULAR | Status: DC | PRN

## 2023-01-13 MED ORDER — ADULT MULTIVITAMIN W/MINERALS CH: 1.0000 | ORAL_TABLET | Freq: Every day | ORAL | Status: DC

## 2023-01-13 NOTE — Progress Notes (Signed)
Notified provider of need to order repeat lactic acid. ° °

## 2023-01-13 NOTE — Assessment & Plan Note (Signed)
Patient meeting severe sepsis criteria based on heart rate 100s, respirations in the upper 20s with persistent lactic acidosis around 3.9-4.2. Noted right-sided multifocal pneumonia on imaging BP stable  COVID flu and RSV negative Pancultured in the ER Add on respiratory cultures including urine strep and Legionella, respiratory culture and respiratory panel IV Rocephin and azithromycin for infectious coverage On heart rate LR IV fluids Will trend lactate

## 2023-01-13 NOTE — ED Notes (Signed)
Pt taken to the room via wheelchair and attached to the monitor; primary RN made aware of the patients arrival.

## 2023-01-13 NOTE — Assessment & Plan Note (Signed)
Lactate 3.9-4.2 in the setting of sepsis associated with right-sided multilobar pneumonia Patient fairly dry on exam Will continue with aggressive maintenance IV fluids with lactated Ringer's Trend lactate Add on VBG to correlate Follow

## 2023-01-13 NOTE — ED Provider Notes (Addendum)
[facility] Provider Note    Event Date/Time   First MD Initiated Contact with Patient 01/13/23 0301     (approximate)   History   Weakness   HPI  Melanie Turner is a 89 y.o. female with past medical history of diabetes, hypertension, dementia who presents because of vomiting and concern for dehydration.  Patient is accompanied by her son with whom she lives.  He notes that today she seemed dehydrated.  When asked, he means by this it sounds more like she just was less active.  Has been eating and drinking normally.  Then this evening he heard her vomiting.  She had 1 episode of emesis nonbloody.  She does not speak at baseline.  Has otherwise been peeing and pooping normally no fevers no cough.     Past Medical History:  Diagnosis Date   Dementia ([place])    Diabetes mellitus without complication ([place])    Hypertension     Patient Active Problem List   Diagnosis Date Noted   Hiatal hernia    Schatzki's ring    Hematemesis without nausea 03/17/2021   Altered mental status    Sepsis ([place]) 11/24/2020   COVID-19 virus infection 11/24/2020   Gastroenteritis due to COVID-19 virus 11/24/2020   DKA (diabetic ketoacidosis) ([place]) [id number]   Acute metabolic encephalopathy [id number]   Hyperglycemia due to type 2 diabetes mellitus ([place]) 10/10/2020   Hypomagnesemia 10/10/2020   UTI (urinary tract infection) 01/28/2020   Dementia ([place]) 01/28/2020   Hypokalemia 01/28/2020   Fall at home, initial encounter 01/28/2020   Closed right hip fracture ([place]) 01/28/2020   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 --      Excl. in [place]? --     Most recent vital signs: Vitals:   01/13/23 0430 01/13/23 0600  BP: (!) 206/109 (!) 165/110  Pulse: (!) 131 (!) 127  Resp: (!) 24 (!) 22  Temp:    SpO2: 96% 91%     General: Awake, chronic ill-appearing, very thin, lower extremities are contracted CV:  Good peripheral perfusion.  No peripheral edema Resp:  Normal effort.  Normal work of breathing Abd:  No distention.  Increased abdominal muscle tone difficult exam no clear tenderness Neuro:             Awake, patient's eyes are open and she will briefly look at me but does not follow commands, she has increased tone throughout and does resist exam and seems to move all of her extremities pupils are reactive and equal bilaterally Other:     ED Results / Procedures / Treatments  Labs (all labs  ordered are listed, but only abnormal results are displayed) Labs Reviewed  LACTIC ACID, PLASMA - Abnormal; Notable for the following components:      Result Value   Lactic Acid, Venous 3.9 (*)    All other components within normal limits  LACTIC ACID, PLASMA - Abnormal; Notable for the following components:   Lactic Acid, Venous 4.2 (*)    All other components within normal limits  COMPREHENSIVE METABOLIC PANEL - Abnormal; Notable for the following components:   Sodium 131 (*)    Chloride 92 (*)    Glucose, Bld 415 (*)    Calcium 8.8 (*)    Total Protein 8.3 (*)    Albumin 3.2 (*)    All other components within normal limits  CBC WITH DIFFERENTIAL/PLATELET - Abnormal; Notable for the following components:   Platelets 411 (*)    Neutro Abs 9.3 (*)    Lymphs Abs 0.6 (*)    All other components within normal limits  URINALYSIS, ROUTINE W REFLEX MICROSCOPIC - Abnormal; Notable for the following components:   Color, Urine  YELLOW (*)    APPearance HAZY (*)    Glucose, UA >=500 (*)    Ketones, ur 5 (*)    Protein, ur 30 (*)    All other components within normal limits  CBG MONITORING, ED - Abnormal; Notable for the following components:   Glucose-Capillary 414 (*)    All other components within normal limits  RESP PANEL BY RT-PCR (RSV, FLU A&B, COVID)  [id number]  CULTURE, BLOOD (ROUTINE X 2)  CULTURE, BLOOD (ROUTINE X 2)  LIPASE, BLOOD  TROPONIN I (HIGH SENSITIVITY)  TROPONIN I (HIGH SENSITIVITY)     EKG  EKG reviewed and interpreted by myself shows sinus tachycardia with left axis deviation left anterior fascicular block ST depression V5 V6   RADIOLOGY I reviewed interpreted the chest x-ray which shows a right lower lobe pneumonia   PROCEDURES:  Critical Care performed: Yes, see critical care procedure note(s)  .1-3 Lead EKG Interpretation  Performed by: Rada Hay, MD Authorized by: Rada Hay, MD     Interpretation: abnormal     ECG rate assessment: tachycardic     Rhythm: sinus tachycardia     Ectopy: none     Conduction: normal   .Critical Care  Performed by: Rada Hay, MD Authorized by: Rada Hay, MD   Critical care provider statement:    Critical care time (minutes):  30   Critical care was time spent personally by me on the following activities:  Development of treatment plan with patient or surrogate, discussions with consultants, evaluation of patient's response to treatment, examination of patient, ordering and review of laboratory studies, ordering and review of radiographic studies, ordering and performing treatments and interventions, pulse oximetry, re-evaluation of patient's condition and review of old charts   The patient is on the cardiac monitor to evaluate for evidence of arrhythmia and/or significant heart rate changes.   MEDICATIONS ORDERED IN ED: Medications  azithromycin (ZITHROMAX) 500 mg in sodium chloride 0.9 % 250 mL IVPB  (500 mg Intravenous New Bag/Given 01/13/23 0513)  lactated ringers infusion ( Intravenous New Bag/Given 01/13/23 0522)  lactated ringers bolus 500 mL (has no administration in time range)  lactated ringers bolus 1,000 mL (0 mLs Intravenous Stopped 01/13/23 0510)  cefTRIAXone (ROCEPHIN) 2 g in sodium chloride 0.9 % 100 mL IVPB (0 g Intravenous Stopped 01/13/23 0510)  lactated ringers bolus 1,000 mL (1,000 mLs Intravenous New Bag/Given  01/13/23 0522)  iohexol (OMNIPAQUE) 300 MG/ML solution 75 mL (75 mLs Intravenous Contrast Given 01/13/23 0455)     IMPRESSION / MDM / ASSESSMENT AND PLAN / ED COURSE  I reviewed the triage vital signs and the nursing notes.                              Patient's presentation is most consistent with acute presentation with potential threat to life or bodily function.  Differential diagnosis includes, but is not limited to, sepsis secondary pneumonia, UTI, bacteremia, DKA, electrode abnormality, SBO, aspiration  The patient is a 89 year old female with underlying dementia who is nonverbal and nonambulatory at baseline lives with her son who presents today because of vomiting.  Her son thought that she was dehydrated because she seemed to be more weak than normal and had an episode of vomiting which is why he brought her to the ER tonight.  On arrival she is tachycardic borderline hypoxic sats in the low 90s okay blood pressure and afebrile.  Patient looks chronically ill she is very thin and somewhat contracted.  She does not give any verbal response does have increased tone throughout but pupils are equal and reactive she is not in respiratory distress.  Patient's abdominal exam is difficult.  She seems to have increased tone wherever you touch her in situ of her abdomen as well difficult to tell whether she is tender.  Given her vomiting plan to obtain CT of the abdomen pelvis.  Will obtain broad septic workup.  Will give fluid bolus.  Patient's chest x-ray is concerning  for right lower lobe pneumonia.  This could have been an aspiration.  Will cover with ceftriaxone and azithromycin.  Patient's lactate is elevated at 3.9, UA is clean.  CBC largely within normal limits.  Glucose elevated at 400 but no signs of DKA.    CT of the abdomen pelvis negative for acute intra-abdominal process.  Patient is on her second bolus of fluid heart rate in the 130s she is tachypneic and is now on 2 L nasal cannula.  Blood pressure is actually elevated but will defer any IV antihypertensives in the setting of her sepsis.  She will require admission.  Patient's repeat lactate is now 4.2 which is actually gone up.  This is after 1 L of fluid.  Heart rate however is downtrending.  She does not appear volume overloaded I think we can continue to give her fluid and repeat the lactate.  Blood pressure is actually elevated.     FINAL CLINICAL IMPRESSION(S) / ED DIAGNOSES   Final diagnoses:  Community acquired pneumonia, unspecified laterality  Sepsis, due to unspecified organism, unspecified whether acute organ dysfunction present [facility])     Rx / DC Orders   ED Discharge Orders     None        Note:  This document was prepared using Dragon voice recognition software and may include unintentional dictation errors.   Rada Hay, MD 01/13/23 [id number]    Rada Hay, MD 01/13/23 [phone]

## 2023-01-13 NOTE — Assessment & Plan Note (Signed)
Noted right-sided multifocal pneumonia on imaging in the setting of concurrent severe sepsis Borderline white count 10.3 Started on IV Rocephin and azithromycin for infectious coverage in the ER Will continue COVID flu and RSV negative Check urine strep and Legionella, respiratory culture, expanded respiratory panel Continue supplemental oxygen as needed Follow

## 2023-01-13 NOTE — Progress Notes (Signed)
Elink monitoring for the code sepsis protocol.  

## 2023-01-13 NOTE — Progress Notes (Signed)
Anticoagulation monitoring(Lovenox):  89yo  F ordered Lovenox 40 mg Q24h    Filed Weights   01/13/23 0255  Weight: 42 kg (92 lb 9.5 oz)   BMI 18   Lab Results  Component Value Date   CREATININE 0.62 01/13/2023   CREATININE 0.62 03/19/2021   CREATININE 1.09 (H) 03/18/2021   Estimated Creatinine Clearance: 32.2 mL/min (by C-G formula based on SCr of 0.62 mg/dL). Hemoglobin & Hematocrit     Component Value Date/Time   HGB 12.7 01/13/2023 0344   HGB 12.7 06/30/2014 1501   HCT 38.4 01/13/2023 0344   HCT 39.0 06/30/2014 1501     Per Protocol for Patient with estCrcl > 30 ml/min and weight < 45 kg, will transition to Lovenox 30 mg Q24h      Chinita Greenland PharmD Clinical Pharmacist 01/13/2023

## 2023-01-13 NOTE — Assessment & Plan Note (Signed)
BP stable Titrate home regimen 

## 2023-01-13 NOTE — ED Notes (Signed)
Pt stable at time of departure 

## 2023-01-13 NOTE — Assessment & Plan Note (Signed)
End-stage dementia with minimal function at home as well as decreasing p.o. intake On Aricept, Remeron and trazodone Hold for now Follow

## 2023-01-13 NOTE — Assessment & Plan Note (Signed)
Patient meeting severe sepsis criteria based on heart rate 100s, respirations in the upper 20s with persistent lactic acidosis around 3.9-4.2. Noted right-sided multifocal pneumonia on imaging COVID flu and RSV negative Pancultured in the ER Add on respiratory cultures including urine strep and Legionella, respiratory culture and respiratory panel IV Rocephin and azithromycin for infectious coverage On heart rate LR IV fluids Will trend lactate

## 2023-01-13 NOTE — H&P (Addendum)
History and Physical    PatientStacyann Turner [id number] DOB: [birth year] DOA: 01/13/2023 DOS: the patient was seen and examined on 01/13/2023 PCP: Kirk Ruths, MD  Patient coming from: Home  Chief Complaint:  Chief Complaint  Patient presents with   Weakness   HPI: Melanie Turner is a 89 y.o. female with medical history significant of dementia, type 2 diabetes, hypertension presenting with sepsis, acute respiratory failure with hypoxia, pneumonia, lactic acidosis.  History primarily from patient's son [name].  Patient currently was with [name] who his her primary caregiver.  Patient noted to be bedbound and nonverbal at baseline.  Per report, patient was noted to be more dehydrated and weak appearing by the son.  Patient had 1 major episode of vomiting yesterday.  No reported bloody or bilious emesis.  Patient with overall worsening weakness since last night.  No reported fevers or chills.  No reports of cough or increased work of breathing.  No recent medication changes. Presented to the ER Tmax nine 9.6, heart rate 100s, respirations in the 20s, blood pressure 160s to 200s over 90s to 100s.  Satting in upper 90s on 2 L in the room.  White count 10.3, hemoglobin 12.7, platelets 411, lactate 4.2-3.9.  Troponin negative.  Urinalysis grossly stable.  COVID flu and RSV negative.  Creatinine 0.62.  Glucose of 415.  Chest x-ray with hazy opacity in the right lower lung.  CT abdomen pelvis showing right-sided multilobar pneumonia as well as moderate stool burden. Review of Systems: As mentioned in the history of present illness. All other systems reviewed and are negative. Past Medical History:  Diagnosis Date   Dementia ([place])    Diabetes mellitus without complication ([place])    Hypertension    Past Surgical History:  Procedure Laterality Date   ESOPHAGOGASTRODUODENOSCOPY N/A 03/18/2021   Procedure: ESOPHAGOGASTRODUODENOSCOPY (EGD);  Surgeon: Virgel Manifold, MD;  Location: [facility]  ENDOSCOPY;  Service: Endoscopy;  Laterality: N/A;   HIP ARTHROPLASTY Right 01/29/2020   Procedure: ARTHROPLASTY BIPOLAR HIP (HEMIARTHROPLASTY);  Surgeon: Thornton Park, MD;  Location: [facility] ORS;  Service: Orthopedics;  Laterality: Right;   THYROIDECTOMY     Social History:  reports that she has never smoked. She has never used smokeless tobacco. She reports that she does not currently use alcohol. She reports that she does not use drugs.  No Known Allergies  Family History  Problem Relation Age of Onset   Cancer Brother        not know which type of cancer    Prior to Admission medications   Medication Sig Start Date End Date Taking? Authorizing Provider  amLODipine (NORVASC) 5 MG tablet Take 1 tablet (5 mg total) by mouth daily. Patient not taking: Reported on 03/17/2021 01/25/21 01/25/22  Naaman Plummer, MD  blood glucose meter kit and supplies KIT Dispense based on patient and insurance preference. Use up to four times daily as directed. (FOR ICD-9 250.00, 250.01). 11/27/20   Fritzi Mandes, MD  clotrimazole-betamethasone (LOTRISONE) cream Apply 1 application topically 2 (two) times daily.    [provider]  donepezil (ARICEPT) 5 MG tablet Take 5 mg by mouth daily.  Patient not taking: No sig reported 06/01/18   [provider]  feeding supplement (ENSURE ENLIVE / ENSURE PLUS) LIQD Take 237 mLs by mouth 2 (two) times daily between meals. 03/19/21   Lorella Nimrod, MD  Infant Care Products [facility] EX) Apply 1 application topically 2 (two) times daily. To sacrum and buttocks for nonspecific rash.  [provider]  metFORMIN (GLUCOPHAGE-XR) 500 MG 24 hr tablet Take 1,500 mg by mouth daily. With dinner. 10/21/20   [provider]  mirtazapine (REMERON) 7.5 MG tablet Take 7.5 mg by mouth daily.  Patient not taking: Reported on 03/17/2021 08/31/18   [provider]  Multiple Vitamin (MULTIVITAMIN WITH MINERALS) TABS tablet Take 1 tablet by mouth  daily. 03/19/21   Lorella Nimrod, MD  omeprazole (PRILOSEC) 20 MG capsule Take 1 capsule (20 mg total) by mouth daily. 03/19/21   Lorella Nimrod, MD  sitaGLIPtin (JANUVIA) 100 MG tablet Take 100 mg by mouth daily. Patient not taking: Reported on 03/17/2021 10/21/20 10/21/21  [provider]  traZODone (DESYREL) 50 MG tablet Take 50 mg by mouth at bedtime. Patient not taking: Reported on 03/17/2021 10/21/20 10/21/21  [provider]    Physical Exam: Vitals:   01/13/23 0400 01/13/23 0430 01/13/23 0600 01/13/23 0702  BP: (!) 183/96 (!) 206/109 (!) 165/110   Pulse: (!) 145 (!) 131 (!) 127   Resp: (!) 24 (!) 24 (!) 22   Temp:    99.6 F (37.6 C)  TempSrc:    Axillary  SpO2: 94% 96% 91%   Weight:      Height:       Physical Exam Constitutional:      General: She is not in acute distress.    Comments: Frail underweight  HENT:     Head: Normocephalic.     Nose: Nose normal.     Mouth/Throat:     Mouth: Mucous membranes are dry.  Eyes:     Pupils: Pupils are equal, round, and reactive to light.  Cardiovascular:     Rate and Rhythm: Normal rate and regular rhythm.  Pulmonary:     Effort: Pulmonary effort is normal.  Abdominal:     General: Abdomen is flat. Bowel sounds are normal.  Musculoskeletal:     Comments: Patient bedbound with decreased general movement  Skin:    General: Skin is dry.  Neurological:     Comments: Patient nonverbal at baseline  Psychiatric:        Mood and Affect: Mood normal.     Data Reviewed:  There are no new results to review at this time. CT ABDOMEN PELVIS W CONTRAST CLINICAL DATA:  Abdominal pain.  Weakness and emesis.  EXAM: CT ABDOMEN AND PELVIS WITH CONTRAST  TECHNIQUE: Multidetector CT imaging of the abdomen and pelvis was performed using the standard protocol following bolus administration of intravenous contrast.  RADIATION DOSE REDUCTION: This exam was performed according to the departmental dose-optimization  program which includes automated exposure control, adjustment of the mA and/or kV according to patient size and/or use of iterative reconstruction technique.  CONTRAST:  61mL OMNIPAQUE IOHEXOL 300 MG/ML  SOLN  COMPARISON:  11/24/2020  FINDINGS: Lower chest: No pleural fluid. Airspace disease noted within the posterior and inferior right middle lobe and throughout the right lower lobe concerning for multifocal pneumonia. Atelectasis versus scarring identified in the posteromedial left base.  Hepatobiliary: No focal liver abnormality is seen. No gallstones, gallbladder wall thickening, or biliary dilatation.  Pancreas: Unremarkable. No pancreatic ductal dilatation or surrounding inflammatory changes.  Spleen: Normal in size without focal abnormality.  Adrenals/Urinary Tract: Normal adrenal glands. Simple cyst within upper pole of the right kidney measuring 1.8 cm. No follow-up imaging recommended. No nephrolithiasis or hydronephrosis identified bilaterally. Urinary bladder is unremarkable  Stomach/Bowel: Stomach appears distended containing food debris and fluid. No signs of small bowel  wall thickening, inflammation or distension. The appendix is not confidently identified separate from the right lower quadrant bowel loops. No secondary signs of acute appendicitis identified. There is a moderate stool burden throughout the colon and rectum.  Vascular/Lymphatic: Aortic atherosclerosis. No aneurysm. No signs of abdominopelvic adenopathy.  Reproductive: Uterus is either atrophic or surgically absent. No adnexal mass identified.  Other: No free fluid or fluid collections. No signs of pneumoperitoneum.  Musculoskeletal: Osteopenia. Status post right hip arthroplasty. No acute findings.  IMPRESSION: 1. No acute findings within the abdomen or pelvis. 2. Airspace disease within the posterior and inferior right middle lobe and throughout the right lower lobe concerning for  multifocal pneumonia. 3. Moderate stool burden throughout the colon and rectum. Correlate for any clinical signs or symptoms of constipation. 4.  Aortic Atherosclerosis (ICD10-I70.0).  Electronically Signed   By: Kerby Moors M.D.   On: 01/13/2023 05:26 DG Chest Port 1 View CLINICAL DATA:  Weakness and emesis  EXAM: PORTABLE CHEST 1 VIEW  COMPARISON:  Radiograph 03/17/2021  FINDINGS: New hazy airspace opacity in the right lower lung suspicious for pneumonia. Left basilar atelectasis/scarring. No pleural effusion or pneumothorax. Stable cardiomediastinal silhouette. Aortic atherosclerotic calcification.  IMPRESSION: New hazy airspace opacity in the right lower lung suspicious for pneumonia.  Electronically Signed   By: Placido Sou M.D.   On: 01/13/2023 03:33  Lab Results  Component Value Date   WBC 10.3 01/13/2023   HGB 12.7 01/13/2023   HCT 38.4 01/13/2023   MCV 85.3 01/13/2023   PLT 411 (H) [id number]   Last metabolic panel Lab Results  Component Value Date   GLUCOSE 415 (H) 01/13/2023   NA 131 (L) 01/13/2023   K 3.6 01/13/2023   CL 92 (L) 01/13/2023   CO2 26 01/13/2023   BUN 17 01/13/2023   CREATININE 0.62 01/13/2023   GFRNONAA >60 01/13/2023   CALCIUM 8.8 (L) 01/13/2023   PHOS 2.3 (L) 10/10/2020   PROT 8.3 (H) 01/13/2023   ALBUMIN 3.2 (L) 01/13/2023   BILITOT 1.1 01/13/2023   ALKPHOS 74 01/13/2023   AST 34 01/13/2023   ALT 18 01/13/2023   ANIONGAP 13 01/13/2023    Assessment and Plan: * Acute respiratory failure with hypoxia (HCC) New O2 requirement up to 2 L in the setting of right-sided multifocal pneumonia IV Rocephin azithromycin for infectious coverage Continue supplemental oxygen   Sepsis [facility]) Patient meeting severe sepsis criteria based on heart rate 100s, respirations in the upper 20s with persistent lactic acidosis around 3.9-4.2. Noted right-sided multifocal pneumonia on imaging BP stable  COVID flu and RSV  negative Pancultured in the ER Add on respiratory cultures including urine strep and Legionella, respiratory culture and respiratory panel IV Rocephin and azithromycin for infectious coverage On heart rate LR IV fluids Will trend lactate   Pneumonia Noted right-sided multifocal pneumonia on imaging in the setting of concurrent severe sepsis Borderline white count 10.3 Started on IV Rocephin and azithromycin for infectious coverage in the ER Will continue COVID flu and RSV negative Check urine strep and Legionella, respiratory culture, expanded respiratory panel Continue supplemental oxygen as needed Follow  Hyperglycemia due to type 2 diabetes mellitus (HCC) Blood sugar in 400s on presentation Start on sliding scale insulin for now A1c Follow  Lactic acidosis Lactate 3.9-4.2 in the setting of sepsis associated with right-sided multilobar pneumonia Patient fairly dry on exam Will continue with aggressive maintenance IV fluids with lactated Ringer's Trend lactate Add on VBG to correlate Follow  Dementia ([place])  End-stage dementia with minimal function at home as well as decreasing p.o. intake On Aricept, Remeron and trazodone Hold for now Follow  Benign essential HTN BP stable Titrate home regimen      Advance Care Planning:   Code Status: Full Code   Consults: None   Family Communication: Son Antonio at the bedside   Severity of Illness: The appropriate patient status for this patient is INPATIENT. Inpatient status is judged to be reasonable and necessary in order to provide the required intensity of service to ensure the patient's safety. The patient's presenting symptoms, physical exam findings, and initial radiographic and laboratory data in the context of their chronic comorbidities is felt to place them at high risk for further clinical deterioration. Furthermore, it is not anticipated that the patient will be medically stable for discharge from the hospital  within 2 midnights of admission.   * I certify that at the point of admission it is my clinical judgment that the patient will require inpatient hospital care spanning beyond 2 midnights from the point of admission due to high intensity of service, high risk for further deterioration and high frequency of surveillance required.*  Author: Deneise Lever, MD 01/13/2023 8:33 AM  For on call review [email or web address].

## 2023-01-13 NOTE — Progress Notes (Signed)
Initial Nutrition Assessment  DOCUMENTATION CODES:   Severe malnutrition in context of chronic illness, Underweight  INTERVENTION:   -Liberalize diet to regular for widest variety of meal selections -Ensure Enlive po TID, each supplement provides 350 kcal and 20 grams of protein -MVI with minerals daily -500 mg vitamin C BID -220 mg zinc sulfate daily x 14 days  NUTRITION DIAGNOSIS:   Severe Malnutrition related to chronic illness (dementia) as evidenced by severe fat depletion, severe muscle depletion.  GOAL:   Patient will meet greater than or equal to 90% of their needs  MONITOR:   PO intake, Supplement acceptance  REASON FOR ASSESSMENT:   Malnutrition Screening Tool, Consult Assessment of nutrition requirement/status  ASSESSMENT:   Pt with medical history significant of dementia, type 2 diabetes, hypertension presenting with sepsis, acute respiratory failure with hypoxia, pneumonia, lactic acidosis.  Pt admitted with acute respiratory failure with hypoxia, sepsis, and pneumonia.   Pt lethargic at time of visit and did not respond to voice or touch. History obtained from pt son at bedside (pt lives with son and family members). Per son, pt usually has a good appetite and consumes 3 meals per day (Breakfast: eggs and toast or cereal or leftovers; Lunch: soup, Dinner: meat, starch, and vegetable). Pt consumes regular textured foods without difficulty and son helps feed her.    Pt son reports pt has lost 10-12# over the past 6 months, which he suspects is likely related to progression of dementia. He is unsure of UBW. Reviewed wt hx; pt has experienced a 12.5% wt loss over the past 10 months, which is not significant for time frame.   Pt son reports that pt had a similar hospital presentation about two years ago (pt started vomiting and was brought to the hospital). Per son, pt only has one instance of vomiting yesterday, but became concerned due to prior hospitalization. Pt  also with a hip fracture and has been bedbound.   Discussed importance of good meal and supplement intake to promote healing. Pt son reports she drank Ensure supplements and is amenable to drink again. Will also liberalize diet to widen variety of meal selections. Suspect oral intake will decrease in hospital secondary to dementia.   Medications reviewed and include lactated ringers infusion @ 150 ml/hr.   Lab Results  Component Value Date   HGBA1C 10.5 (H) 11/26/2020   PTA DM medications are 1500 mg metformin daily and 100 mg sitagliptin daily. Per ADA's Standards of Medical Care of Diabetes, glycemic targets for older adults who have multiple co-morbidities, cognitive impairments, and functional dependence should be less stringent (Hgb A1c <8.0-8.5). Pt with advanced age, dementia, and malnutrition and is at high risk for hypoglycemia and would likely be a poor candidate for long term insulin use.   Labs reviewed: Na: 131, CBGS: 113-309 (inpatient orders for glycemic control are 0-9 units insulin aspart every 4 hours).    NUTRITION - FOCUSED PHYSICAL EXAM:  Flowsheet Row Most Recent Value  Orbital Region Severe depletion  Upper Arm Region Severe depletion  Thoracic and Lumbar Region Severe depletion  Buccal Region Severe depletion  Temple Region Severe depletion  Clavicle Bone Region Severe depletion  Clavicle and Acromion Bone Region Severe depletion  Scapular Bone Region Severe depletion  Dorsal Hand Severe depletion  Patellar Region Severe depletion  Anterior Thigh Region Severe depletion  Posterior Calf Region Severe depletion  Edema (RD Assessment) None  Hair Reviewed  Eyes Reviewed  Mouth Reviewed  Skin Reviewed  Nails  Reviewed       Diet Order:   Diet Order             Diet regular Room service appropriate? Yes; Fluid consistency: Thin  Diet effective now                   EDUCATION NEEDS:   Education needs have been addressed  Skin:  Skin Assessment:  Skin Integrity Issues: Skin Integrity Issues:: Stage II, Stage I, Stage III, Unstageable Stage I: vertebral column, rt hip Stage II: rt tibial, upper anus Stage III: rt hip, sacrum Unstageable: rt ankle  Last BM:  01/13/23  Height:   Ht Readings from Last 1 Encounters:  01/13/23 5\' 2"  (1.575 m)    Weight:   Wt Readings from Last 1 Encounters:  01/13/23 42 kg    Ideal Body Weight:  50 kg  BMI:  Body mass index is 16.94 kg/m.  Estimated Nutritional Needs:   Kcal:  1500-1700  Protein:  85-100 grams  Fluid:  > 1.5 L    Loistine Chance, RD, LDN, [place] Registered Dietitian II Certified Diabetes Care and Education Specialist Please refer to [facility] for RD and/or RD on-call/weekend/after hours pager

## 2023-01-13 NOTE — Progress Notes (Signed)
CODE SEPSIS - PHARMACY COMMUNICATION  **Broad Spectrum Antibiotics should be administered within 1 hour of Sepsis diagnosis**  Time Code Sepsis Called/Page Received: 3/15 @ 0425  Antibiotics Ordered: ceftriaxone , azithromycin  Time of 1st antibiotic administration: Ceftriaxone 2 gm IV X 1 on 3/15 @ 0427   Additional action taken by pharmacy:   If necessary, Name of Provider/Nurse Contacted:     Regenia Erck D ,PharmD Clinical Pharmacist  01/13/2023  4:35 AM

## 2023-01-13 NOTE — Assessment & Plan Note (Signed)
New O2 requirement up to 2 L in the setting of right-sided multifocal pneumonia IV Rocephin azithromycin for infectious coverage Continue supplemental oxygen

## 2023-01-13 NOTE — Evaluation (Signed)
Clinical/Bedside Swallow Evaluation Patient Details  Name: Melanie Turner MRN: [id number] Date of Birth: 1931/11/08  Today's Date: 01/13/2023 Time: SLP Start Time (ACUTE ONLY): 1440 SLP Stop Time (ACUTE ONLY): 1530 SLP Time Calculation (min) (ACUTE ONLY): 50 min  Past Medical History:  Past Medical History:  Diagnosis Date   Dementia ([place])    Diabetes mellitus without complication ([place])    Hypertension    Past Surgical History:  Past Surgical History:  Procedure Laterality Date   ESOPHAGOGASTRODUODENOSCOPY N/A 03/18/2021   Procedure: ESOPHAGOGASTRODUODENOSCOPY (EGD);  Surgeon: Virgel Manifold, MD;  Location: [facility] ENDOSCOPY;  Service: Endoscopy;  Laterality: N/A;   HIP ARTHROPLASTY Right 01/29/2020   Procedure: ARTHROPLASTY BIPOLAR HIP (HEMIARTHROPLASTY);  Surgeon: Thornton Park, MD;  Location: [facility] ORS;  Service: Orthopedics;  Laterality: Right;   THYROIDECTOMY     HPI:  Pt is a 89 y.o. female with medical history significant of dementia, type 2 diabetes, hypertension presenting with sepsis, acute respiratory failure with hypoxia, pneumonia, lactic acidosis.  History primarily from patient's son [place].  Patient currently was with [name] who his her primary caregiver.  Patient noted to be bedbound and nonverbal at baseline.  Per report, patient was noted to be more dehydrated and weak appearing by the son.  Patient had 1 major episode of vomiting yesterday.  No reported bloody or bilious emesis.  Patient with overall worsening weakness since last night.  No reported fevers or chills.  No reports of cough or increased work of breathing.  No recent medication changes.  Per Chart: pt had an EGD in 2022 revealing a schatzki's ring, dilation performed.  CT of abdomen:  Airspace disease within the posterior and inferior right middle  lobe and throughout the right lower lobe concerning for multifocal  pneumonia.  3. Moderate stool burden throughout the colon and rectum.    Assessment /  Plan / Recommendation  Clinical Impression   Pt seen for BSE today. Per Son, pt is drowsy; appeared to present w/ shutdown presentation(impact from the Dementia); lying in semi-fetal position in bed on R side. Nonverbal. Son present in room. Pt has a Baseline h/o dysphagia d/t impact from Cognitive decline and has been recommended to be on a Dysphagia level 1 diet w/ thin liquids in 2020, 2022(per chart notes). Pt also has charted h/o Esophageal phase Dysmotility in setting of a Schatzki's ring in 2022.  Afebrile; WBC elevated. [place] O2 2L.  Pt appears to present w/ oropharyngeal phase dysphagia in setting of significantly declined Cognitive status; Baseline Advanced Dementia. Son reported that pt's presentation fluctuates. ANY Cognitive decline can impact her overall awareness/timing of swallowing and safety during po tasks which increases risk for aspiration, choking. Pt is also missing Dentition and has a Baseline of Esophageal phase Dysmotility in 2022. These factors could also impact safety w/ oral intake thus lead to aspiration/aspiration pneumonia.        Pt consumed only 1 trial of pudding(Puree) placed at lips; mouth opened to the MOD stim of TSP at lips. Once she pulled the bolus from the spoon, she exhibited oral holding of the pudding bolus. MOD++ verbal/tactile cues given to encourage oral management, transfer and swallow of bolus OR expectoration of bolus. This SLP presented an oral swab at lips/cheek which pt opened mouth to receive(slightly) then began chewing on the swab -- this led to pt pharyngeally swallowing and clearing the pudding bolus being held orally. NO further po trials given.  No overt clinical s/s of aspiration noted w/ the pharyngeal  swallow of the pudding bolus; no decline in respiratory status, no cough. Pt exhibited significantly decreased oral phase awareness from stim at lips to holding of bolus orally. Suspect direct impact from the Advanced Dementia. OM Exam could not be  completed d/t Cognitive status and shutdown presentation.          In setting of oropharyngeal phase dysphagia, baseline Advanced Dementia and Cognitive decline, and risk for aspiration, recommend NPO status w/ frequent oral care for hygiene and stimulation of swallowing. ST services will f/u daily for ongoing assessment of pt's ability to safely swallow a least restrictive diet. MD/NSG updated.  ST services recommends consult w/ Palliative Care for [place] and education re: impact of Cognitive decline/Dementia on swallowing. Suspect pt's presentation could hinder safe return to previous oral diet. Precautions posted in room. SLP Visit Diagnosis: Dysphagia, oropharyngeal phase (R13.12) (baseline Dementia)    Aspiration Risk  Moderate aspiration risk;Risk for inadequate nutrition/hydration    Diet Recommendation   NPO status w/ frequent oral care for hygiene and stimulation of swallowing  Medication Administration: Via alternative means    Other  Recommendations Recommended Consults:  (Palliative Care consult; Dietician f/u) Oral Care Recommendations: Oral care QID;Staff/trained caregiver to provide oral care Caregiver Recommendations:  (TBD)    Recommendations for follow up therapy are one component of a multi-disciplinary discharge planning process, led by the attending physician.  Recommendations may be updated based on patient status, additional functional criteria and insurance authorization.  Follow up Recommendations Skilled nursing-short term rehab (<3 hours/day) (TBD)      Assistance Recommended at Discharge  FULL  Functional Status Assessment Patient has had a recent decline in their functional status and/or demonstrates limited ability to make significant improvements in function in a reasonable and predictable amount of time  Frequency and Duration min 2x/week  2 weeks       Prognosis Prognosis for improved oropharyngeal function: Guarded Barriers to Reach Goals: Cognitive  deficits;Language deficits;Time post onset;Severity of deficits;Behavior Barriers/Prognosis Comment: baseline Advanced Dementia      Swallow Study   General Date of Onset: 01/13/23 HPI: Pt is a 89 y.o. female with medical history significant of dementia, type 2 diabetes, hypertension presenting with sepsis, acute respiratory failure with hypoxia, pneumonia, lactic acidosis.  History primarily from patient's son [place].  Patient currently was with [name] who his her primary caregiver.  Patient noted to be bedbound and nonverbal at baseline.  Per report, patient was noted to be more dehydrated and weak appearing by the son.  Patient had 1 major episode of vomiting yesterday.  No reported bloody or bilious emesis.  Patient with overall worsening weakness since last night.  No reported fevers or chills.  No reports of cough or increased work of breathing.  No recent medication changes.  Per Chart: pt had an EGD in 2022 revealing a schatzki's ring, dilation performed.  CT of abdomen:  Airspace disease within the posterior and inferior right middle  lobe and throughout the right lower lobe concerning for multifocal  pneumonia.  3. Moderate stool burden throughout the colon and rectum. Type of Study: Bedside Swallow Evaluation Previous Swallow Assessment: 2020; 2022 - pureed diet w/ thins Diet Prior to this Study: Regular;Thin liquids (Level 0) (per MD at admit) Temperature Spikes Noted: No (wbc 12.6) Respiratory Status: Nasal cannula (2L) History of Recent Intubation: No Behavior/Cognition: Confused;Lethargic/Drowsy;Doesn't follow directions Oral Cavity Assessment: Within Functional Limits (w/ brief view) Oral Care Completed by SLP: Yes Oral Cavity - Dentition: Missing dentition (MOST -  few front lower dentition noted) Vision:  (n/a) Self-Feeding Abilities: Total assist Patient Positioning: Upright in bed (full positioning assistance given) Baseline Vocal Quality:  (nonverbal at  baseline) Volitional Cough: Cognitively unable to elicit Volitional Swallow: Unable to elicit    Oral/Motor/Sensory Function Overall Oral Motor/Sensory Function:  (CNT - noted lingual bunching)   Ice Chips Ice chips: Not tested   Thin Liquid Thin Liquid: Not tested    Nectar Thick Nectar Thick Liquid: Impaired Presentation: Spoon (1 trial) Oral Phase Impairments: Poor awareness of bolus Other Comments: did not open mouth   Honey Thick Honey Thick Liquid: Not tested   Puree Puree: Impaired Presentation: Spoon (1 trial) Oral Phase Impairments: Poor awareness of bolus Oral Phase Functional Implications: Oral holding Pharyngeal Phase Impairments:  (none) Other Comments: attempted to sweep clear from oral cavity d/t oral holding   Solid     Solid: Not tested       Orinda Kenner, MS, CCC-SLP Speech Language Pathologist Rehab Services; [place] [phone] (ascom) Azaylea Maves 01/13/2023,3:36 PM

## 2023-01-13 NOTE — Assessment & Plan Note (Signed)
Lactate 3.94.2 in the setting of sepsis associated with right-sided multilobar pneumonia Patient fairly dry on exam Will continue with aggressive maintenance IV fluids with lactated Ringer's Trend lactate Add on VBG to correlate Follow

## 2023-01-13 NOTE — Consult Note (Signed)
[facility] Nurse Consult Note: Reason for Consult: multiple pressure injuries from home. Non verbal, bedbound, from home with family as CG. 92 lbs at current admission. Report to have no PO intake by bedside nursing.  Wound type: Stage 3 pressure injury; sacrum; 100% pink, clean, moist  Stage 3 Pressure injury; right hip; 100% clean, pink Unstageable Pressure Injury; right malleolar; 50% pink, 50% darkened/black Stage 3 pressure injuries x 2 right lateral tibial region; 100% pale pink Pressure Injury POA: Yes Measurement: see nursing flow sheets  Wound bed: see above  Drainage (amount, consistency, odor) scant  Periwound: intact,  Dressing procedure/placement/frequency: Low air loss mattress for moisture management and pressure redistribution SP evaluation for swallowing RD for supplementation for wound healing Hydrogel to the wounds, top with gauze, change daily Ok to use xeroform if not able to obtain hydrogel from materials.    Re consult if needed, will not follow at this time. Thanks  Eline Geng [employer], RN,CWOCN, CNS, [place] [phone])

## 2023-01-13 NOTE — Assessment & Plan Note (Signed)
Blood sugar in 400s on presentation Start on sliding scale insulin for now A1c Follow

## 2023-01-13 NOTE — Progress Notes (Signed)
Sacral wound and pressure ulcer wound pictures added to chart per wound care request.

## 2023-01-13 NOTE — ED Triage Notes (Signed)
Pt presents ambulatory to triage via POV with complaints of weakness and emesis that started this AM per family. Pt has hx of dementia and is non-verbal. Per Son, the patients emesis was brown in color and resembled her dinner. A&Ox4 at this time. Denies CP or SOB.

## 2023-01-14 DIAGNOSIS — J9601 Acute respiratory failure with hypoxia: Secondary | ICD-10-CM | POA: Diagnosis not present

## 2023-01-14 LAB — COMPREHENSIVE METABOLIC PANEL
ALT: 12 U/L (ref 0–44)
AST: 22 U/L (ref 15–41)
Albumin: 2.4 g/dL — ABNORMAL LOW (ref 3.5–5.0)
Alkaline Phosphatase: 58 U/L (ref 38–126)
Anion gap: 9 (ref 5–15)
BUN: 14 mg/dL (ref 8–23)
CO2: 24 mmol/L (ref 22–32)
Calcium: 8.6 mg/dL — ABNORMAL LOW (ref 8.9–10.3)
Chloride: 101 mmol/L (ref 98–111)
Creatinine, Ser: 0.34 mg/dL — ABNORMAL LOW (ref 0.44–1.00)
GFR, Estimated: >60 mL/min (ref >60)
Glucose, Bld: 106 mg/dL — ABNORMAL HIGH (ref 70–99)
Potassium: 3 mmol/L — ABNORMAL LOW (ref 3.5–5.1)
Sodium: 134 mmol/L — ABNORMAL LOW (ref 135–145)
Total Bilirubin: 0.7 mg/dL (ref 0.3–1.2)
Total Protein: 6.3 g/dL — ABNORMAL LOW (ref 6.5–8.1)

## 2023-01-14 LAB — CBC
HCT: 32 % — ABNORMAL LOW (ref 36.0–46.0)
Hemoglobin: 10.4 g/dL — ABNORMAL LOW (ref 12.0–15.0)
MCH: 28.5 pg (ref 26.0–34.0)
MCHC: 32.5 g/dL (ref 30.0–36.0)
MCV: 87.7 fL (ref 80.0–100.0)
Platelets: 221 10*3/uL (ref 150–400)
RBC: 3.65 MIL/uL — ABNORMAL LOW (ref 3.87–5.11)
RDW: 13.5 % (ref 11.5–15.5)
WBC: 9.1 10*3/uL (ref 4.0–10.5)
nRBC: 0 % (ref 0.0–0.2)

## 2023-01-14 LAB — BLOOD GAS, VENOUS
Acid-Base Excess: 6.5 mmol/L — ABNORMAL HIGH (ref 0.0–2.0)
Bicarbonate: 32.8 mmol/L — ABNORMAL HIGH (ref 20.0–28.0)
O2 Saturation: 96.1 %
Patient temperature: 37
pCO2, Ven: 53 mmHg (ref 44–60)
pH, Ven: 7.4 (ref 7.25–7.43)
pO2, Ven: 72 mmHg — ABNORMAL HIGH (ref 32–45)

## 2023-01-14 LAB — GLUCOSE, CAPILLARY
Glucose-Capillary: 109 mg/dL — ABNORMAL HIGH (ref 70–99)
Glucose-Capillary: 111 mg/dL — ABNORMAL HIGH (ref 70–99)
Glucose-Capillary: 132 mg/dL — ABNORMAL HIGH (ref 70–99)
Glucose-Capillary: 84 mg/dL (ref 70–99)
Glucose-Capillary: 84 mg/dL (ref 70–99)
Glucose-Capillary: 84 mg/dL (ref 70–99)
Glucose-Capillary: 87 mg/dL (ref 70–99)
Glucose-Capillary: 88 mg/dL (ref 70–99)

## 2023-01-14 MED ORDER — POTASSIUM CHLORIDE 20 MEQ PO PACK: 40.0000 meq | PACK | Freq: Once | ORAL | Status: DC

## 2023-01-14 MED ORDER — POTASSIUM CHLORIDE 10 MEQ/100ML IV SOLN
10.0000 meq | INTRAVENOUS | Status: AC
  Administered 2023-01-14 (×4): 10 meq via INTRAVENOUS
  Filled 2023-01-14 (×4): qty 100

## 2023-01-14 NOTE — Progress Notes (Signed)
SLP Cancellation Note  Patient Details Name: Melanie Turner MRN: [id number] DOB: Nov 30, 1931   Cancelled treatment:       Reason Eval/Treat Not Completed: Medical issues which prohibited therapy;Patient's level of consciousness;Patient not medically ready (chart reviewed; consulted NSG re: pt's status today. Observed pt w/ NSG. Consulted MD.)  Met w/ NSG and pt in room this morning during a NSG assessment. Pt appeared to withdraw to mild discomfort, eyes remained tightly shut, and she remained in a Fetal position on her side. Cachectic-appearing. She did NOT demonstrate any improvement in her alertness nor awareness of self/environment. She did not respond orally to light stim at lips by NSG.  At this time, this SLP does not feel pt is safe for any oral intake d/t HIGH risk for choking/aspiration in setting of absent alertness/awareness. Suspect direct relationship to the Advanced Dementia.  Recommend pt remain strict NPO w/ frequent oral care for hygiene and stim for swallowing until pt is full alert to participate in safe oral intake, and until Family can be seen by Palliative Care for QOL/GOC discussion -- noted the FULL Code status currently. NSG updated.  ST services will f/u next 1-2 days. MD updated and agreed. MD to f/u w/ Family today.    Orinda Kenner, MS, CCC-SLP Speech Language Pathologist Rehab Services; [place] [phone] (ascom) Robbie Rideaux 01/14/2023, 1:43 PM

## 2023-01-14 NOTE — Progress Notes (Addendum)
PROGRESS NOTE    Melanie Turner  [id number] DOB: May 21, 1932 DOA: 01/13/2023 PCP: Kirk Ruths, MD    Brief Narrative:  This 89 yrs old female with PMH significant for dementia, type 2 diabetes, hypertension presented in the ED with generalized weakness.  History is obtained from son.  Patient noted to be bedbound and nonverbal at baseline.  Son reports patient was noted to be more dehydrated and weak .  She had an episode of vomiting yesterday and been feeling generally weak afterwards.  He denies any fever or chills.  ED workup temp. 99.6, heart rate 100, respiratory rate 20, blood pressure 160/90.  She was hypoxic requiring 2 L of supplemental oxygen.  Labs include WBC 10.3, Lactic acid 4.2, Chest x-ray showed hazy opacity in the right lower lung.  CT abdomen and pelvis showed multi lobar pneumonia as well as moderate stool burden.  Patient is admitted for sepsis likely secondary to decubitus ulcers and multifocal pneumonia.  Assessment & Plan:   Principal Problem:   Acute respiratory failure with hypoxia (HCC) Active Problems:   Sepsis ([place])   Pneumonia   Hyperglycemia due to type 2 diabetes mellitus (HCC)   Benign essential HTN   Dementia (HCC)   Lactic acidosis   Protein-calorie malnutrition, severe  Acute hypoxic respiratory failure: Likely secondary to multifocal pneumonia. Patient does not use oxygen at baseline. Chest x-ray showed hazy opacity in the right lower lung. Continue IV empiric antibiotics ( ceftriaxone and Zithromax) Continue supplemental oxygen and wean as tolerated.  Severe sepsis sec. to multifocal pneumonia: Patient presented with tachycardia,  tachypnea,  lactic acidosis, leukocytosis. Chest x-ray showed multifocal pneumonia. COVID, Influenza, RSV negative. Follow-up blood and urine cultures. Follow-up urinary strep and Legionella antigen. Continue empiric antibiotics. Continue IV fluid resuscitation. Lactic acid improved with IV fluid  resuscitation.  Multifocal pneumonia: Continue ceftriaxone and Zithromax.  Type 2 diabetes: She is found to have blood sugar of 400 on presentation. Start regular insulin sliding scale.  Lactic acidosis: Likely in the setting of  multifocal pneumonia. Continue IV fluid resuscitation, Lactic acid improving.  Dementia, Advanced: Patient does have end-stage dementia with minimal function at home as well as decreased p.o. intake. Continue Aricept, Remeron and trazodone. Palliative care consulted to discuss goals of care.  Hypertension: Continue hydralazine as needed as needed for hypertension.  Decubitus Bed ulcers: Continue wound care. Change body posture q4hrs  DVT prophylaxis: Lovenox Code Status: Full code Family Communication: Son at bed side. Disposition Plan:   Status is: Inpatient Remains inpatient appropriate because: Admitted for multiple problems, severe sepsis secondary to multifocal pneumonia acute hypoxic respiratory failure requiring supplemental oxygen.  Follow-up cultures. Palliative care consulted to discuss goals of care.    Consultants:  None  Procedures: NONE  Antimicrobials:  Anti-infectives (From admission, onward)    Start     Dose/Rate Route Frequency Ordered Stop   01/14/23 0600  cefTRIAXone (ROCEPHIN) 2 g in sodium chloride 0.9 % 100 mL IVPB        2 g 200 mL/hr over 30 Minutes Intravenous Every 24 hours 01/13/23 0823 01/19/23 0559   01/14/23 0600  azithromycin (ZITHROMAX) 500 mg in sodium chloride 0.9 % 250 mL IVPB        500 mg 250 mL/hr over 60 Minutes Intravenous Every 24 hours 01/13/23 0823 01/19/23 0559   01/13/23 0400  cefTRIAXone (ROCEPHIN) 2 g in sodium chloride 0.9 % 100 mL IVPB        2 g 200 mL/hr over  30 Minutes Intravenous  Once 01/13/23 0350 01/13/23 0510   01/13/23 0400  azithromycin (ZITHROMAX) 500 mg in sodium chloride 0.9 % 250 mL IVPB        500 mg 250 mL/hr over 60 Minutes Intravenous  Once 01/13/23 0350 01/13/23 [id number]       Subjective: Patient was seen and examined at bedside.  Overnight events noted.   Patient remains nonverbal, remains on left side. She is arousable, following partial commands.  Objective: Vitals:   01/13/23 2037 01/14/23 0455 01/14/23 0802 01/14/23 0815  BP: 121/62 (!) 158/95 134/88 (!) 142/98  Pulse: 94 (!) 106 78 (!) 107  Resp: 16 16 18 16   Temp: (!) 97.5 F (36.4 C) 98 F (36.7 C) 97.6 F (36.4 C) 98 F (36.7 C)  TempSrc: Oral Oral Oral Oral  SpO2: 100% 100% 100% 100%  Weight:      Height:        Intake/Output Summary (Last 24 hours) at 01/14/2023 1318 Last data filed at 01/14/2023 1217 Gross per 24 hour  Intake 1594.96 ml  Output 600 ml  Net 994.96 ml   Filed Weights   01/13/23 0255  Weight: 42 kg    Examination:  General exam: Appears comfortable, not in any acute distress, very deconditioned.  Cachectic Respiratory system: CTA bilaterally, respiratory effort normal, RR 16. Cardiovascular system: S1 & S2 heard, regular rate and rhythm, no murmur. Gastrointestinal system: Abdomen is soft, non tender, non distended, BS+ Central nervous system: Alert and oriented x 2. No focal neurological deficits. Extremities: No edema, no cyanosis, no clubbing. Skin: No rashes, lesions or ulcers Psychiatry:  Mood & affect appropriate.     Data Reviewed: I have personally reviewed following labs and imaging studies  CBC: Recent Labs  Lab 01/13/23 0344 01/13/23 0908 01/14/23 0530  WBC 10.3 12.6* 9.1  NEUTROABS 9.3*  --   --   HGB 12.7 12.6 10.4*  HCT 38.4 37.5 32.0*  MCV 85.3 84.3 87.7  PLT 411* 318 [id number]   Basic Metabolic Panel: Recent Labs  Lab 01/13/23 0344 01/13/23 0908 01/14/23 0530  NA 131*  --  134*  K 3.6  --  3.0*  CL 92*  --  101  CO2 26  --  24  GLUCOSE 415*  --  106*  BUN 17  --  14  CREATININE 0.62 0.47 0.34*  CALCIUM 8.8*  --  8.6*   GFR: Estimated Creatinine Clearance: 32.2 mL/min (A) (by C-G formula based on SCr of 0.34 mg/dL  (L)). Liver Function Tests: Recent Labs  Lab 01/13/23 0344 01/14/23 0530  AST 34 22  ALT 18 12  ALKPHOS 74 58  BILITOT 1.1 0.7  PROT 8.3* 6.3*  ALBUMIN 3.2* 2.4*   Recent Labs  Lab 01/13/23 0342  LIPASE 44   No results for input(s): "AMMONIA" in the last 168 hours. Coagulation Profile: No results for input(s): "INR", "PROTIME" in the last 168 hours. Cardiac Enzymes: No results for input(s): "CKTOTAL", "CKMB", "CKMBINDEX", "TROPONINI" in the last 168 hours. BNP (last 3 results) No results for input(s): "PROBNP" in the last 8760 hours. HbA1C: Recent Labs    01/13/23 0342  HGBA1C 10.7*   CBG: Recent Labs  Lab 01/14/23 0000 01/14/23 0347 01/14/23 0456 01/14/23 0818 01/14/23 1159  GLUCAP 84 109* 111* 132* 84   Lipid Profile: No results for input(s): "CHOL", "HDL", "LDLCALC", "TRIG", "CHOLHDL", "LDLDIRECT" in the last 72 hours. Thyroid Function Tests: No results for input(s): "TSH", "T4TOTAL", "FREET4", "T3FREE", "THYROIDAB" in  the last 72 hours. Anemia Panel: No results for input(s): "VITAMINB12", "FOLATE", "FERRITIN", "TIBC", "IRON", "RETICCTPCT" in the last 72 hours. Sepsis Labs: Recent Labs  Lab 01/13/23 0346 01/13/23 0523 01/13/23 0700 01/13/23 0908  LATICACIDVEN 3.9* 4.2* 3.9* 2.6*    Recent Results (from the past 240 hour(s))  Blood culture (routine x 2)     Status: None (Preliminary result)   Collection Time: 01/13/23  3:42 AM   Specimen: BLOOD  Result Value Ref Range Status   Specimen Description BLOOD BLOOD RIGHT ARM  Final   Special Requests   Final    BOTTLES DRAWN AEROBIC AND ANAEROBIC Blood Culture adequate volume   Culture   Final    NO GROWTH 1 DAY Performed at [facility], [address], [place], [place] [address]    Report Status PENDING  Incomplete  Resp panel by RT-PCR (RSV, Flu A&B, Covid) Anterior Nasal Swab     Status: None   Collection Time: 01/13/23  3:45 AM   Specimen: Anterior Nasal Swab  Result Value Ref Range  Status   SARS Coronavirus 2 by RT PCR NEGATIVE NEGATIVE Final    Comment: (NOTE) SARS-CoV-2 target nucleic acids are NOT DETECTED.  The SARS-CoV-2 RNA is generally detectable in upper respiratory specimens during the acute phase of infection. The lowest concentration of SARS-CoV-2 viral copies this assay can detect is 138 copies/mL. A negative result does not preclude SARS-Cov-2 infection and should not be used as the sole basis for treatment or other patient management decisions. A negative result may occur with  improper specimen collection/handling, submission of specimen other than nasopharyngeal swab, presence of viral mutation(s) within the areas targeted by this assay, and inadequate number of viral copies(<138 copies/mL). A negative result must be combined with clinical observations, patient history, and epidemiological information. The expected result is Negative.  Fact Sheet for Patients:  EntrepreneurPulse.com.au  Fact Sheet for Healthcare Providers:  IncredibleEmployment.be  This test is no t yet approved or cleared by the [place] FDA and  has been authorized for detection and/or diagnosis of SARS-CoV-2 by FDA under an Emergency Use Authorization (EUA). This EUA will remain  in effect (meaning this test can be used) for the duration of the COVID-19 declaration under Section 564(b)(1) of the Act, 21 U.S.C.section 360bbb-3(b)(1), unless the authorization is terminated  or revoked sooner.       Influenza A by PCR NEGATIVE NEGATIVE Final   Influenza B by PCR NEGATIVE NEGATIVE Final    Comment: (NOTE) The Xpert Xpress SARS-CoV-2/FLU/RSV plus assay is intended as an aid in the diagnosis of influenza from Nasopharyngeal swab specimens and should not be used as a sole basis for treatment. Nasal washings and aspirates are unacceptable for Xpert Xpress SARS-CoV-2/FLU/RSV testing.  Fact Sheet for  Patients: EntrepreneurPulse.com.au  Fact Sheet for Healthcare Providers: IncredibleEmployment.be  This test is not yet approved or cleared by the [place] FDA and has been authorized for detection and/or diagnosis of SARS-CoV-2 by FDA under an Emergency Use Authorization (EUA). This EUA will remain in effect (meaning this test can be used) for the duration of the COVID-19 declaration under Section 564(b)(1) of the Act, 21 U.S.C. section 360bbb-3(b)(1), unless the authorization is terminated or revoked.     Resp Syncytial Virus by PCR NEGATIVE NEGATIVE Final    Comment: (NOTE) Fact Sheet for Patients: EntrepreneurPulse.com.au  Fact Sheet for Healthcare Providers: IncredibleEmployment.be  This test is not yet approved or cleared by the [place] FDA and has been  authorized for detection and/or diagnosis of SARS-CoV-2 by FDA under an Emergency Use Authorization (EUA). This EUA will remain in effect (meaning this test can be used) for the duration of the COVID-19 declaration under Section 564(b)(1) of the Act, 21 U.S.C. section 360bbb-3(b)(1), unless the authorization is terminated or revoked.  Performed at [facility], [address] [place], [place] [address]   Blood culture (routine x 2)     Status: None (Preliminary result)   Collection Time: 01/13/23  9:08 AM   Specimen: BLOOD  Result Value Ref Range Status   Specimen Description BLOOD BLOOD LEFT HAND  Final   Special Requests   Final    BOTTLES DRAWN AEROBIC AND ANAEROBIC Blood Culture results may not be optimal due to an inadequate volume of blood received in culture bottles   Culture   Final    NO GROWTH < 24 HOURS Performed at [facility], [address], [place], [place] [address]    Report Status PENDING  Incomplete    Radiology Studies: CT ABDOMEN PELVIS W CONTRAST  Result Date: 01/13/2023 CLINICAL DATA:   Abdominal pain.  Weakness and emesis. EXAM: CT ABDOMEN AND PELVIS WITH CONTRAST TECHNIQUE: Multidetector CT imaging of the abdomen and pelvis was performed using the standard protocol following bolus administration of intravenous contrast. RADIATION DOSE REDUCTION: This exam was performed according to the departmental dose-optimization program which includes automated exposure control, adjustment of the mA and/or kV according to patient size and/or use of iterative reconstruction technique. CONTRAST:  51mL OMNIPAQUE IOHEXOL 300 MG/ML  SOLN COMPARISON:  11/24/2020 FINDINGS: Lower chest: No pleural fluid. Airspace disease noted within the posterior and inferior right middle lobe and throughout the right lower lobe concerning for multifocal pneumonia. Atelectasis versus scarring identified in the posteromedial left base. Hepatobiliary: No focal liver abnormality is seen. No gallstones, gallbladder wall thickening, or biliary dilatation. Pancreas: Unremarkable. No pancreatic ductal dilatation or surrounding inflammatory changes. Spleen: Normal in size without focal abnormality. Adrenals/Urinary Tract: Normal adrenal glands. Simple cyst within upper pole of the right kidney measuring 1.8 cm. No follow-up imaging recommended. No nephrolithiasis or hydronephrosis identified bilaterally. Urinary bladder is unremarkable Stomach/Bowel: Stomach appears distended containing food debris and fluid. No signs of small bowel wall thickening, inflammation or distension. The appendix is not confidently identified separate from the right lower quadrant bowel loops. No secondary signs of acute appendicitis identified. There is a moderate stool burden throughout the colon and rectum. Vascular/Lymphatic: Aortic atherosclerosis. No aneurysm. No signs of abdominopelvic adenopathy. Reproductive: Uterus is either atrophic or surgically absent. No adnexal mass identified. Other: No free fluid or fluid collections. No signs of pneumoperitoneum.  Musculoskeletal: Osteopenia. Status post right hip arthroplasty. No acute findings. IMPRESSION: 1. No acute findings within the abdomen or pelvis. 2. Airspace disease within the posterior and inferior right middle lobe and throughout the right lower lobe concerning for multifocal pneumonia. 3. Moderate stool burden throughout the colon and rectum. Correlate for any clinical signs or symptoms of constipation. 4.  Aortic Atherosclerosis (ICD10-I70.0). Electronically Signed   By: Kerby Moors M.D.   On: 01/13/2023 05:26   DG Chest Port 1 View  Result Date: 01/13/2023 CLINICAL DATA:  Weakness and emesis EXAM: PORTABLE CHEST 1 VIEW COMPARISON:  Radiograph 03/17/2021 FINDINGS: New hazy airspace opacity in the right lower lung suspicious for pneumonia. Left basilar atelectasis/scarring. No pleural effusion or pneumothorax. Stable cardiomediastinal silhouette. Aortic atherosclerotic calcification. IMPRESSION: New hazy airspace opacity in the right lower lung suspicious for pneumonia.  Electronically Signed   By: Placido Sou M.D.   On: 01/13/2023 03:33    Scheduled Meds:  vitamin C  500 mg Oral BID   enoxaparin (LOVENOX) injection  30 mg Subcutaneous Q24H   feeding supplement  237 mL Oral TID BM   insulin aspart  0-9 Units Subcutaneous Q4H   multivitamin with minerals  1 tablet Oral Daily   potassium chloride  40 mEq Oral Once   zinc sulfate  220 mg Oral Daily   Continuous Infusions:  azithromycin 500 mg (01/14/23 0617)   cefTRIAXone (ROCEPHIN)  IV 2 g (01/14/23 0533)   potassium chloride 10 mEq (01/14/23 1236)     LOS: 1 day    Time spent: 50 mins    Duard Brady, MD [facility]   If 7PM-7AM, please contact night-coverage

## 2023-01-15 DIAGNOSIS — J9601 Acute respiratory failure with hypoxia: Secondary | ICD-10-CM | POA: Diagnosis not present

## 2023-01-15 LAB — GLUCOSE, CAPILLARY
Glucose-Capillary: 88 mg/dL (ref 70–99)
Glucose-Capillary: 82 mg/dL (ref 70–99)
Glucose-Capillary: 89 mg/dL (ref 70–99)
Glucose-Capillary: 87 mg/dL (ref 70–99)
Glucose-Capillary: 95 mg/dL (ref 70–99)

## 2023-01-15 LAB — COMPREHENSIVE METABOLIC PANEL
ALT: 14 U/L (ref 0–44)
AST: 22 U/L (ref 15–41)
Albumin: 2.4 g/dL — ABNORMAL LOW (ref 3.5–5.0)
Alkaline Phosphatase: 57 U/L (ref 38–126)
Anion gap: 13 (ref 5–15)
BUN: 13 mg/dL (ref 8–23)
CO2: 26 mmol/L (ref 22–32)
Calcium: 8.9 mg/dL (ref 8.9–10.3)
Chloride: 96 mmol/L — ABNORMAL LOW (ref 98–111)
Creatinine, Ser: 0.39 mg/dL — ABNORMAL LOW (ref 0.44–1.00)
GFR, Estimated: >60 mL/min (ref >60)
Glucose, Bld: 86 mg/dL (ref 70–99)
Potassium: 3.1 mmol/L — ABNORMAL LOW (ref 3.5–5.1)
Sodium: 135 mmol/L (ref 135–145)
Total Bilirubin: 1 mg/dL (ref 0.3–1.2)
Total Protein: 6.8 g/dL (ref 6.5–8.1)

## 2023-01-15 LAB — CBC
HCT: 32.6 % — ABNORMAL LOW (ref 36.0–46.0)
Hemoglobin: 10.6 g/dL — ABNORMAL LOW (ref 12.0–15.0)
MCH: 28.2 pg (ref 26.0–34.0)
MCHC: 32.5 g/dL (ref 30.0–36.0)
MCV: 86.7 fL (ref 80.0–100.0)
Platelets: 259 10*3/uL (ref 150–400)
RBC: 3.76 MIL/uL — ABNORMAL LOW (ref 3.87–5.11)
RDW: 13.5 % (ref 11.5–15.5)
WBC: 8.7 10*3/uL (ref 4.0–10.5)
nRBC: 0 % (ref 0.0–0.2)

## 2023-01-15 LAB — PHOSPHORUS: Phosphorus: 3 mg/dL (ref 2.5–4.6)

## 2023-01-15 LAB — MAGNESIUM: Magnesium: 1.5 mg/dL — ABNORMAL LOW (ref 1.7–2.4)

## 2023-01-15 LAB — LACTIC ACID, PLASMA: Lactic Acid, Venous: 0.9 mmol/L (ref 0.5–1.9)

## 2023-01-15 MED ORDER — SODIUM CHLORIDE 0.9 % IV SOLN: INTRAVENOUS | Status: DC | PRN

## 2023-01-15 MED ORDER — SODIUM CHLORIDE 0.9 % IV SOLN: INTRAVENOUS | Status: DC

## 2023-01-15 MED ORDER — MAGNESIUM SULFATE 2 GM/50ML IV SOLN
2.0000 g | Freq: Once | INTRAVENOUS | Status: AC
  Administered 2023-01-15: 2 g via INTRAVENOUS
  Filled 2023-01-15: qty 50

## 2023-01-15 MED ORDER — POTASSIUM CHLORIDE 10 MEQ/100ML IV SOLN
10.0000 meq | INTRAVENOUS | Status: AC
  Administered 2023-01-15 (×3): 10 meq via INTRAVENOUS
  Filled 2023-01-15 (×3): qty 100

## 2023-01-15 NOTE — Progress Notes (Signed)
PROGRESS NOTE    Melanie Turner  [id number] DOB: [birth year] DOA: 01/13/2023 PCP: Kirk Ruths, MD    Brief Narrative:  This 89 yrs old female with PMH significant for dementia, type 2 diabetes, hypertension presented in the ED with generalized weakness.  History is obtained from son.  Patient noted to be bedbound and nonverbal at baseline.  Son reports patient was noted to be more dehydrated and weak .  She had an episode of vomiting yesterday and been feeling generally weak afterwards.  He denies any fever or chills.  ED workup reveals temp. 99.6, heart rate 100, respiratory rate 20, blood pressure 160/90.  She was hypoxic requiring 2 L of supplemental oxygen.  Labs include WBC 10.3, Lactic acid 4.2, Chest x-ray showed hazy opacity in the right lower lung.  CT abdomen and pelvis showed multi lobar pneumonia as well as moderate stool burden.  Patient is admitted for sepsis likely secondary to decubitus ulcers and multifocal pneumonia.  Assessment & Plan:   Principal Problem:   Acute respiratory failure with hypoxia (HCC) Active Problems:   Sepsis ([place])   Pneumonia   Hyperglycemia due to type 2 diabetes mellitus (HCC)   Benign essential HTN   Dementia (HCC)   Lactic acidosis   Protein-calorie malnutrition, severe  Acute hypoxic respiratory failure: Likely secondary to multifocal pneumonia. Patient does not use oxygen at baseline. Chest x-ray showed hazy opacity in the right lower lung. Continue IV empiric antibiotics ( ceftriaxone and Zithromax) Continue supplemental oxygen and wean as tolerated.  Severe sepsis sec. to multifocal pneumonia: Patient presented with tachycardia,  tachypnea,  lactic acidosis, leukocytosis. Chest x-ray showed multifocal pneumonia. COVID, Influenza, RSV negative. Blood cultures No growth to date, urine culture contaminated. Follow-up urinary strep and Legionella antigen. Continue empiric antibiotics. Continue IV fluid resuscitation. Lactic  acid improved with IV fluid resuscitation.  Multifocal pneumonia: Continue ceftriaxone and Zithromax.  Type 2 diabetes: She is found to have blood sugar of 400 on presentation. Continue regular insulin sliding scale. Patient is NPO due to high risk for aspiration.  Lactic acidosis: Likely in the setting of  multifocal pneumonia. Continue IV fluid resuscitation, Lactic acid improving.  Dementia, Advanced: Patient does have end-stage dementia with minimal function at home as well as decreased p.o. intake. Continue Aricept, Remeron and trazodone.  Speech and swallow recommended n.p.o. due to high risk for aspiration. Palliative care consulted to discuss goals of care.  Hypertension: Continue hydralazine as needed as needed for hypertension.  Decubitus Bed ulcers: Continue wound care. Change body posture q4hrs  DVT prophylaxis: Lovenox Code Status: Full code Family Communication: Son at bed side. Disposition Plan:   Status is: Inpatient Remains inpatient appropriate because: Admitted for multiple problems, severe sepsis secondary to multifocal pneumonia,  acute hypoxic respiratory failure requiring supplemental oxygen.  Follow-up cultures. Palliative care consulted to discuss goals of care.    Consultants:  None  Procedures: NONE  Antimicrobials:  Anti-infectives (From admission, onward)    Start     Dose/Rate Route Frequency Ordered Stop   01/14/23 0600  cefTRIAXone (ROCEPHIN) 2 g in sodium chloride 0.9 % 100 mL IVPB        2 g 200 mL/hr over 30 Minutes Intravenous Every 24 hours 01/13/23 0823 01/19/23 0559   01/14/23 0600  azithromycin (ZITHROMAX) 500 mg in sodium chloride 0.9 % 250 mL IVPB        500 mg 250 mL/hr over 60 Minutes Intravenous Every 24 hours 01/13/23 0823 01/19/23 0559  01/13/23 0400  cefTRIAXone (ROCEPHIN) 2 g in sodium chloride 0.9 % 100 mL IVPB        2 g 200 mL/hr over 30 Minutes Intravenous  Once 01/13/23 0350 01/13/23 0510   01/13/23 0400   azithromycin (ZITHROMAX) 500 mg in sodium chloride 0.9 % 250 mL IVPB        500 mg 250 mL/hr over 60 Minutes Intravenous  Once 01/13/23 0350 01/13/23 [id number]      Subjective: Patient was seen and examined at bedside. Overnight events noted.  Patient remains non-verbal, remains on the left side. She is arousable, following partial commands. Son at bedside, he states she is at her baseline now.  Objective: Vitals:   01/14/23 1649 01/14/23 2001 01/15/23 0503 01/15/23 0800  BP: (!) 158/87 (!) 156/81 (!) 146/96 (!) 159/99  Pulse: 82 87 (!) 108 97  Resp: 20 16 18 16   Temp: (!) 97.5 F (36.4 C) (!) 97.5 F (36.4 C) 97.7 F (36.5 C) 97.7 F (36.5 C)  TempSrc: Oral Oral Oral Oral  SpO2: 100% 100% 92% 100%  Weight:      Height:        Intake/Output Summary (Last 24 hours) at 01/15/2023 1041 Last data filed at 01/15/2023 0806 Gross per 24 hour  Intake 1108.22 ml  Output 700 ml  Net 408.22 ml   Filed Weights   01/13/23 0255  Weight: 42 kg    Examination:  General exam: Appears very deconditioned, cachectic, chronically ill looking, not in any distress. Respiratory system: Decreased breath sounds, respiratory effort normal, no accessory muscle use, RR 15 Cardiovascular system: S1 & S2 heard, regular rate and rhythm, no murmur. Gastrointestinal system: Abdomen is soft, non tender, non distended, BS+ Central nervous system: Alert and oriented x 1, no focal neurological deficits. Extremities: No edema, no cyanosis, no clubbing. Skin: No rashes, lesions or ulcers Psychiatry:  Mood & affect appropriate.     Data Reviewed: I have personally reviewed following labs and imaging studies  CBC: Recent Labs  Lab 01/13/23 0344 01/13/23 0908 01/14/23 0530 01/15/23 0429  WBC 10.3 12.6* 9.1 8.7  NEUTROABS 9.3*  --   --   --   HGB 12.7 12.6 10.4* 10.6*  HCT 38.4 37.5 32.0* 32.6*  MCV 85.3 84.3 87.7 86.7  PLT 411* 318 221 [id number]   Basic Metabolic Panel: Recent Labs  Lab 01/13/23 0344  01/13/23 0908 01/14/23 0530 01/15/23 0429  NA 131*  --  134* 135  K 3.6  --  3.0* 3.1*  CL 92*  --  101 96*  CO2 26  --  24 26  GLUCOSE 415*  --  106* 86  BUN 17  --  14 13  CREATININE 0.62 0.47 0.34* 0.39*  CALCIUM 8.8*  --  8.6* 8.9  MG  --   --   --  1.5*  PHOS  --   --   --  3.0   GFR: Estimated Creatinine Clearance: 32.2 mL/min (A) (by C-G formula based on SCr of 0.39 mg/dL (L)). Liver Function Tests: Recent Labs  Lab 01/13/23 0344 01/14/23 0530 01/15/23 0429  AST 34 22 22  ALT 18 12 14   ALKPHOS 74 58 57  BILITOT 1.1 0.7 1.0  PROT 8.3* 6.3* 6.8  ALBUMIN 3.2* 2.4* 2.4*   Recent Labs  Lab 01/13/23 0342  LIPASE 44   No results for input(s): "AMMONIA" in the last 168 hours. Coagulation Profile: No results for input(s): "INR", "PROTIME" in the last 168 hours.  Cardiac Enzymes: No results for input(s): "CKTOTAL", "CKMB", "CKMBINDEX", "TROPONINI" in the last 168 hours. BNP (last 3 results) No results for input(s): "PROBNP" in the last 8760 hours. HbA1C: Recent Labs    01/13/23 0342  HGBA1C 10.7*   CBG: Recent Labs  Lab 01/14/23 1648 01/14/23 2002 01/14/23 2353 01/15/23 0430 01/15/23 0801  GLUCAP 87 88 84 88 82   Lipid Profile: No results for input(s): "CHOL", "HDL", "LDLCALC", "TRIG", "CHOLHDL", "LDLDIRECT" in the last 72 hours. Thyroid Function Tests: No results for input(s): "TSH", "T4TOTAL", "FREET4", "T3FREE", "THYROIDAB" in the last 72 hours. Anemia Panel: No results for input(s): "VITAMINB12", "FOLATE", "FERRITIN", "TIBC", "IRON", "RETICCTPCT" in the last 72 hours. Sepsis Labs: Recent Labs  Lab 01/13/23 0346 01/13/23 0523 01/13/23 0700 01/13/23 0908  LATICACIDVEN 3.9* 4.2* 3.9* 2.6*    Recent Results (from the past 240 hour(s))  Blood culture (routine x 2)     Status: None (Preliminary result)   Collection Time: 01/13/23  3:42 AM   Specimen: BLOOD  Result Value Ref Range Status   Specimen Description BLOOD BLOOD RIGHT ARM  Final    Special Requests   Final    BOTTLES DRAWN AEROBIC AND ANAEROBIC Blood Culture adequate volume   Culture   Final    NO GROWTH 2 DAYS Performed at [facility], [address], [place], [place] [address]    Report Status PENDING  Incomplete  Resp panel by RT-PCR (RSV, Flu A&B, Covid) Anterior Nasal Swab     Status: None   Collection Time: 01/13/23  3:45 AM   Specimen: Anterior Nasal Swab  Result Value Ref Range Status   SARS Coronavirus 2 by RT PCR NEGATIVE NEGATIVE Final    Comment: (NOTE) SARS-CoV-2 target nucleic acids are NOT DETECTED.  The SARS-CoV-2 RNA is generally detectable in upper respiratory specimens during the acute phase of infection. The lowest concentration of SARS-CoV-2 viral copies this assay can detect is 138 copies/mL. A negative result does not preclude SARS-Cov-2 infection and should not be used as the sole basis for treatment or other patient management decisions. A negative result may occur with  improper specimen collection/handling, submission of specimen other than nasopharyngeal swab, presence of viral mutation(s) within the areas targeted by this assay, and inadequate number of viral copies(<138 copies/mL). A negative result must be combined with clinical observations, patient history, and epidemiological information. The expected result is Negative.  Fact Sheet for Patients:  EntrepreneurPulse.com.au  Fact Sheet for Healthcare Providers:  IncredibleEmployment.be  This test is no t yet approved or cleared by the [place] FDA and  has been authorized for detection and/or diagnosis of SARS-CoV-2 by FDA under an Emergency Use Authorization (EUA). This EUA will remain  in effect (meaning this test can be used) for the duration of the COVID-19 declaration under Section 564(b)(1) of the Act, 21 U.S.C.section 360bbb-3(b)(1), unless the authorization is terminated  or revoked sooner.       Influenza A by  PCR NEGATIVE NEGATIVE Final   Influenza B by PCR NEGATIVE NEGATIVE Final    Comment: (NOTE) The Xpert Xpress SARS-CoV-2/FLU/RSV plus assay is intended as an aid in the diagnosis of influenza from Nasopharyngeal swab specimens and should not be used as a sole basis for treatment. Nasal washings and aspirates are unacceptable for Xpert Xpress SARS-CoV-2/FLU/RSV testing.  Fact Sheet for Patients: EntrepreneurPulse.com.au  Fact Sheet for Healthcare Providers: IncredibleEmployment.be  This test is not yet approved or cleared by the [place] and has been authorized  for detection and/or diagnosis of SARS-CoV-2 by FDA under an Emergency Use Authorization (EUA). This EUA will remain in effect (meaning this test can be used) for the duration of the COVID-19 declaration under Section 564(b)(1) of the Act, 21 U.S.C. section 360bbb-3(b)(1), unless the authorization is terminated or revoked.     Resp Syncytial Virus by PCR NEGATIVE NEGATIVE Final    Comment: (NOTE) Fact Sheet for Patients: EntrepreneurPulse.com.au  Fact Sheet for Healthcare Providers: IncredibleEmployment.be  This test is not yet approved or cleared by the [place] FDA and has been authorized for detection and/or diagnosis of SARS-CoV-2 by FDA under an Emergency Use Authorization (EUA). This EUA will remain in effect (meaning this test can be used) for the duration of the COVID-19 declaration under Section 564(b)(1) of the Act, 21 U.S.C. section 360bbb-3(b)(1), unless the authorization is terminated or revoked.  Performed at [facility], [address] [place], [place] [address]   Blood culture (routine x 2)     Status: None (Preliminary result)   Collection Time: 01/13/23  9:08 AM   Specimen: BLOOD  Result Value Ref Range Status   Specimen Description BLOOD BLOOD LEFT HAND  Final   Special Requests   Final    BOTTLES  DRAWN AEROBIC AND ANAEROBIC Blood Culture results may not be optimal due to an inadequate volume of blood received in culture bottles   Culture   Final    NO GROWTH 2 DAYS Performed at [facility], [address] [place], [place] [address]    Report Status PENDING  Incomplete    Radiology Studies: No results found.  Scheduled Meds:  vitamin C  500 mg Oral BID   enoxaparin (LOVENOX) injection  30 mg Subcutaneous Q24H   feeding supplement  237 mL Oral TID BM   insulin aspart  0-9 Units Subcutaneous Q4H   multivitamin with minerals  1 tablet Oral Daily   potassium chloride  40 mEq Oral Once   zinc sulfate  220 mg Oral Daily   Continuous Infusions:  sodium chloride     azithromycin Stopped (01/15/23 0812)   cefTRIAXone (ROCEPHIN)  IV 200 mL/hr at 01/15/23 0806   magnesium sulfate bolus IVPB     potassium chloride       LOS: 2 days   Time spent: 35 mins  Duard Brady, MD [facility]   If 7PM-7AM, please contact night-coverage

## 2023-01-15 NOTE — IPAL (Signed)
  Interdisciplinary Goals of Care Family Meeting   Date carried out: 01/15/2023  Location of the meeting: Phone conference   Member's involved: Physician, Family member  Durable Power of Attorney or acting medical decision maker: Melanie Turner ( Daughter) [id number]  Discussion: We discussed goals of care for [facility] .    Code status:   Code Status: DNR   Disposition: Continue current acute care  Time spent for the meeting: 62 mins    Duard Brady, MD  01/15/2023, 11:06 AM

## 2023-01-16 DIAGNOSIS — J189 Pneumonia, unspecified organism: Secondary | ICD-10-CM | POA: Diagnosis not present

## 2023-01-16 DIAGNOSIS — G309 Alzheimer's disease, unspecified: Secondary | ICD-10-CM

## 2023-01-16 DIAGNOSIS — J9601 Acute respiratory failure with hypoxia: Secondary | ICD-10-CM | POA: Diagnosis not present

## 2023-01-16 DIAGNOSIS — Z515 Encounter for palliative care: Secondary | ICD-10-CM

## 2023-01-16 DIAGNOSIS — F02B0 Dementia in other diseases classified elsewhere, moderate, without behavioral disturbance, psychotic disturbance, mood disturbance, and anxiety: Secondary | ICD-10-CM

## 2023-01-16 LAB — LEGIONELLA PNEUMOPHILA SEROGP 1 UR AG: L. pneumophila Serogp 1 Ur Ag: NEGATIVE

## 2023-01-16 LAB — GLUCOSE, CAPILLARY
Glucose-Capillary: 102 mg/dL — ABNORMAL HIGH (ref 70–99)
Glucose-Capillary: 104 mg/dL — ABNORMAL HIGH (ref 70–99)
Glucose-Capillary: 104 mg/dL — ABNORMAL HIGH (ref 70–99)
Glucose-Capillary: 106 mg/dL — ABNORMAL HIGH (ref 70–99)
Glucose-Capillary: 198 mg/dL — ABNORMAL HIGH (ref 70–99)
Glucose-Capillary: 295 mg/dL — ABNORMAL HIGH (ref 70–99)

## 2023-01-16 NOTE — Consult Note (Signed)
Consultation Note Date: 01/16/2023 at 1100  [name]  DOB: 1932-04-28  MRN: [id number]  Age / Sex: 89 y.o., female  PCP: Kirk Ruths, MD Referring Physician: Duard Brady, MD  Reason for Consultation: Establishing goals of care  HPI/Patient Profile: 89 y.o. female  with past medical history of dementia, type 2 diabetes, right hip arthroplasty (2021), thyroidectomy, and HTN admitted on 01/13/2023 with weakness, 1 episode of vomiting, and dehydration.  COVID, flu, and RSV negative.  CT of abdomen revealed right-sided multilobar pneumonia as well as a moderate stool burden.  Patient is being treated with IV Rocephin and azithromycin and IVF.   PMT was consulted to discuss goals of care.   Clinical Assessment and Goals of Care: I have reviewed medical records including EPIC notes, labs and imaging, assessed the patient and then met with patient, her son Myna Bright and her daughter Leotis Shames at bedside to discuss diagnosis prognosis, [place], EOL wishes, disposition and options.  Patient assessed and unable to participate in goals of care discussions at this time.  She is nonverbal throughout our discussions.  She has no signs of pain or discomfort.  Symptoms assessed.  Patient unable to communicate her needs but does not appear to have any signs or symptoms of discomfort at this time.  No adjustments to medications recommended today.  I introduced Palliative Medicine as specialized medical care for people living with serious illness. It focuses on providing relief from the symptoms and stress of a serious illness. The goal is to improve quality of life for both the patient and the family.  We discussed a brief life review of the patient.  Patient has 3 children (2 sons [place] and [place], 1 daughter Leotis Shames).  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  Family  shares patient really enjoyed working and likes to stay busy at work.  As far as functional and nutritional status family endorses patient was eating well at home PTA.  Family also endorses patient was communicative and able to engage in meaningful conversation PTA.  Daughter dons a shares that patient oftentimes with thinks she was at work but was pleasantly confused.  Both [place] and [place] share that patient's weakness and poor p.o. intake was a rapid onset since Friday.  We discussed patient's current illness and what it means in the larger context of patient's on-going co-morbidities.  Natural disease trajectory discussed.  We discussed that dementia is a progressive, chronic, and irreversible disease that is often accelerated by acute event such as hospitalizations or viruses.    I attempted to elicit values and goals of care important to the patient.  [place] a share they are hopeful that their mother will get better over the next 2 to 3 days.  Their goal is for her to recover from the pneumonia and to return home.    They would like additional evaluation by SLP so that patient could begin to eat/drink again.  They both remain in agreement with DNR  but want to treat the treatable and are accepting of all other medical interventions up and to the point of a cardiopulmonary arrest.  We discussed that patient needs to have safe swallowing in order to avoid aspiration.  I did outline that if patient's family is willing to accept the risk of aspiration then patient could be initiated on comfort feeds.  However, this would mean her plan of care would shift from medical treatment to comfort measures.  Family shares they want to continue to evaluate patient swallow and see if she will be able to eat and drink on her own soon.  Education offered regarding concept specific to human mortality and the limitations of medical interventions to prolong life when the body begins to fail to thrive.   Functional, nutritional, and cognitive status discussed to significant indicators of patient's overall prognosis.   Discussed with patient/family the importance of continued conversation with family and the medical providers regarding overall plan of care and treatment options, ensuring decisions are within the context of the patient's values and GOCs.    Discussed treating the treatable.  DNR remains.  PMT will continue to follow.  Questions and concerns were addressed. The family was encouraged to call with questions or concerns.  PMT contact info given and family encouraged to call with any palliative needs.  Primary Decision Maker NEXT OF KIN  Physical Exam Vitals reviewed.  Constitutional:      Comments: Cachectic, thin, frail  HENT:     Mouth/Throat:     Mouth: Mucous membranes are moist.  Cardiovascular:     Rate and Rhythm: Normal rate.     Pulses: Normal pulses.  Pulmonary:     Effort: Pulmonary effort is normal.  Abdominal:     Palpations: Abdomen is soft.  Skin:    General: Skin is warm and dry.  Neurological:     Comments: nonverbal     Palliative Assessment/Data: 20-30%     Thank you for this consult. Palliative medicine will continue to follow and assist holistically.   Time Total: 75 minutes Greater than 50%  of this time was spent counseling and coordinating care related to the above assessment and plan.  Signed by: Jordan Hawks, DNP, FNP-BC Palliative Medicine    Please contact Palliative Medicine Team phone at [phone] for questions and concerns.  For individual provider: See Shea Evans

## 2023-01-16 NOTE — Progress Notes (Addendum)
Speech Language Pathology Treatment: Dysphagia  Patient Details Name: Melanie Turner MRN: [id number] DOB: 1932/01/25 Today's Date: 01/16/2023 Time: [id number] SLP Time Calculation (min) (ACUTE ONLY): 50 min  Assessment / Plan / Recommendation Clinical Impression   Pt seen for ongoing assessment of swallowing today. Pt required full positioning upright in bed; legs pulled up to chest and lying in semi-fetal position in bed on L side. Nonverbal - this is Baseline per Family, chart notes. Pt appeared to engage more than at initial visit 2 days ago -- ongoing IVS and IV meds noted. Son and Daughter present in room. Pt has a Baseline h/o dysphagia d/t impact from Dementia/Cognitive decline and has been recommended to be on a Dysphagia level 1 diet w/ thin liquids in 2020, 2022(per chart notes). Pt also has charted h/o Esophageal phase Dysmotility in setting of a Schatzki's ring in 2022.  Afebrile; WBC WNL. [place] O2 2L.  Pt appears to present w/ oral phase dysphagia w/ risk for pharyngeal phase dysphagia in setting of declined Cognitive status; Baseline Dementia, dependency on being fed, and positioning challenges. ANY Cognitive decline can impact overall awareness/timing of swallow and safety during po tasks which increases risk for aspiration, choking.  Pt does have risk for aspiration w/ oral intake d/t the Advanced Cognitive decline, dependency being fed, and positioning challenges. Pt's risk for aspiration can be reduced(NOT eliminated) when following aspiration precautions, when being fed and w/ proper positioning, and when using a modified diet consistency of pureed foods. Also, any GI dysmotility can increase risk for retrograde flow and aspiration of REFLUX material. She does require MOD-MAX verbal/tactile cues for oral prep and bolus acceptance currently.        Pt consumed trials of purees and thin liquids via tsp/cup w/ No immediate, overt clinical s/s of aspiration noted: no cough and no decline in  respiratory status during/post trials. Unable to assess vocal quality. Swallows revealed functional laryngeal excursion though somewhat difficult to assess consistently. Oral phase c/b poor oral prep and bolus acceptance intermittently. When pt became more aware of po trials at lips, she opened mouth more readily. Noted increased lingual sweeping and gumming/chewing behaviors b/t trials. Functional bolus management and oral clearing of the boluses given noted -- alternated b/t foods/liquids often. Time given b/t trials for increased oral movements and to not stress pt; full feeding support required(baseline).         In setting of above presentation, and baseline Dementia and Cognitive decline and diet at home, recommend a dysphagia level 1(Pureed foods moistened for ease of oral phase) w/ thin liquids via tsp/cup -- no straws; aspiration precautions; reduce Distractions during meals. Feeding at meals giving Time b/t bites/sips for oral clearing. Oral care b/f and after meals. Pills Crushed in Puree for safer swallowing. MD/NSG updated.  ST services will f/u w/ any further education needs; toleration of diet. Recommend ongoing follow up w/ Palliative Care for [place] and education re: impact of Cognitive decline/Dementia on swallowing. Suspect pt is close to/at her baseline. Precautions posted in room. Family agreed.         HPI HPI: Pt is a 89 y.o. female with medical history significant of dementia, type 2 diabetes, hypertension presenting with sepsis, acute respiratory failure with hypoxia, pneumonia, lactic acidosis.  History primarily from patient's son [place].  Patient currently was with [name] who his her primary caregiver.  Patient noted to be bedbound and nonverbal at baseline.  Per report, patient was noted to be more dehydrated and weak appearing  by the son.  Patient had 1 major episode of vomiting yesterday.  No reported bloody or bilious emesis.  Patient with overall worsening weakness since  last night.  No reported fevers or chills.  No reports of cough or increased work of breathing.  No recent medication changes.  Per Chart: pt had an EGD in 2022 revealing a schatzki's ring, dilation performed.  CT of abdomen:  Airspace disease within the posterior and inferior right middle  lobe and throughout the right lower lobe concerning for multifocal  pneumonia.  3. Moderate stool burden throughout the colon and rectum.      SLP Plan  Continue with current plan of care      Recommendations for follow up therapy are one component of a multi-disciplinary discharge planning process, led by the attending physician.  Recommendations may be updated based on patient status, additional functional criteria and insurance authorization.    Recommendations  Diet recommendations: Dysphagia 1 (puree);Thin liquid (baseline diet at home) Liquids provided via: Cup;Teaspoon;No straw Medication Administration: Crushed with puree Supervision: Staff to assist with self feeding;Full supervision/cueing for compensatory strategies Compensations: Minimize environmental distractions;Slow rate;Small sips/bites;Lingual sweep for clearance of pocketing;Follow solids with liquid (TIME b/t bites/sips) Postural Changes and/or Swallow Maneuvers: Out of bed for meals;Seated upright 90 degrees;Upright 30-60 min after meal                General recommendations:  (Palliative Care; Dietician f/u) Oral Care Recommendations: Oral care QID;Oral care before and after PO;Staff/trained caregiver to provide oral care Follow Up Recommendations: Follow physician's recommendations for discharge plan and follow up therapies Assistance recommended at discharge: Frequent or constant Supervision/Assistance (baseline) SLP Visit Diagnosis: Dysphagia, oropharyngeal phase (R13.12) (baseline Dementia) Plan: Continue with current plan of care              Orinda Kenner, [place], [place]; [place] [phone] (ascom) Slyvester Latona  01/16/2023, 3:01 PM

## 2023-01-16 NOTE — Care Management Important Message (Signed)
Important Message  Patient Details  Name: Melanie Turner MRN: [id number] Date of Birth: Jun 18, 1932   Medicare Important Message Given:  Yes     Dannette Barbara 01/16/2023, 12:22 PM

## 2023-01-16 NOTE — Progress Notes (Signed)
PROGRESS NOTE    Melanie Turner  [id number] DOB: 06/24/1932 DOA: 01/13/2023 PCP: Kirk [place]s, MD    Brief Narrative:  This 89 yrs old female with PMH significant for dementia, type 2 diabetes, hypertension presented in the ED with generalized weakness.  History is obtained from son.  Patient noted to be bedbound and nonverbal at baseline.  Son reports patient was noted to be more dehydrated and weak .  She had an episode of vomiting yesterday and been feeling generally weak afterwards.  He denies any fever or chills.  ED workup reveals temp. 99.6, heart rate 100, respiratory rate 20, blood pressure 160/90.  She was hypoxic requiring 2 L of supplemental oxygen.  Labs include WBC 10.3, Lactic acid 4.2, Chest x-ray showed hazy opacity in the right lower lung.  CT abdomen and pelvis showed multi lobar pneumonia as well as moderate stool burden.  Patient is admitted for sepsis likely secondary to decubitus ulcers and multifocal pneumonia.  Assessment & Plan:   Principal Problem:   Acute respiratory failure with hypoxia (HCC) Active Problems:   Sepsis ([place])   Pneumonia   Hyperglycemia due to type 2 diabetes mellitus (HCC)   Benign essential HTN   Dementia (HCC)   Lactic acidosis   Protein-calorie malnutrition, severe  Acute hypoxic respiratory failure: Likely secondary to multifocal pneumonia. Patient does not use oxygen at baseline. Chest x-ray showed hazy opacity in the right lower lung. Continue IV empiric antibiotics ( ceftriaxone and Zithromax) Continue supplemental oxygen and wean as tolerated.  Severe sepsis sec. to multifocal pneumonia: Patient presented with tachycardia,  tachypnea,  lactic acidosis, leukocytosis. Chest x-ray showed multifocal pneumonia. COVID, Influenza, RSV negative. Blood cultures No growth to date, urine culture contaminated. Follow-up urinary strep and Legionella antigen. Continue empiric antibiotics. Continue IV fluid resuscitation. Lactic  acid improved with IV fluid resuscitation.  Multifocal pneumonia: Continue ceftriaxone and Zithromax.  Type 2 diabetes: She is found to have blood sugar of 400 on presentation. Continue regular insulin sliding scale. Patient is NPO due to high risk for aspiration.  Lactic acidosis: Likely in the setting of  multifocal pneumonia. Continue IV fluid resuscitation, Lactic acid improved.  Dementia, Advanced: Patient does have end-stage dementia with minimal function at home as well as decreased p.o. intake. Continue Aricept, Remeron and trazodone.  Speech and swallow recommended n.p.o. due to high risk for aspiration. Palliative care consulted to discuss goals of care. Family wants to treat the treatable.  Hypertension: Continue hydralazine as needed for hypertension.  Decubitus Bed ulcers: Continue wound care. Change body posture q4hrs  DVT prophylaxis: Lovenox Code Status: Full code Family Communication: Son at bed side. Disposition Plan:   Status is: Inpatient Remains inpatient appropriate because: Admitted for multiple problems, severe sepsis secondary to multifocal pneumonia,  acute hypoxic respiratory failure requiring supplemental oxygen.  Follow-up cultures. Palliative care consulted to discuss goals of care.    Consultants:  Palliative Care  Procedures: NONE  Antimicrobials:  Anti-infectives (From admission, onward)    Start     Dose/Rate Route Frequency Ordered Stop   01/14/23 0600  cefTRIAXone (ROCEPHIN) 2 g in sodium chloride 0.9 % 100 mL IVPB        2 g 200 mL/hr over 30 Minutes Intravenous Every 24 hours 01/13/23 0823 01/19/23 0559   01/14/23 0600  azithromycin (ZITHROMAX) 500 mg in sodium chloride 0.9 % 250 mL IVPB        500 mg 250 mL/hr over 60 Minutes Intravenous Every 24 hours 01/13/23  [id number] 01/19/23 0559   01/13/23 0400  cefTRIAXone (ROCEPHIN) 2 g in sodium chloride 0.9 % 100 mL IVPB        2 g 200 mL/hr over 30 Minutes Intravenous  Once 01/13/23 0350  01/13/23 0510   01/13/23 0400  azithromycin (ZITHROMAX) 500 mg in sodium chloride 0.9 % 250 mL IVPB        500 mg 250 mL/hr over 60 Minutes Intravenous  Once 01/13/23 0350 01/13/23 [id number]      Subjective: Patient was seen and examined at bedside. Overnight events noted.  Patient remains nonverbal, remains on the right side.  She is arousable following partially commands. Son and daughter at bedside,  states she is at her baseline now.  Objective: Vitals:   01/15/23 2124 01/16/23 0432 01/16/23 0700 01/16/23 0800  BP: (!) 152/74 (!) 160/84 (!) 164/104 (!) 160/98  Pulse: 90 92 99   Resp: 18 16 18    Temp: 97.7 F (36.5 C) 97.7 F (36.5 C) 97.6 F (36.4 C)   TempSrc: Oral Oral Oral   SpO2: 100% 100% 100%   Weight:      Height:        Intake/Output Summary (Last 24 hours) at 01/16/2023 1332 Last data filed at 01/16/2023 1000 Gross per 24 hour  Intake 1445.04 ml  Output --  Net 1445.04 ml   Filed Weights   01/13/23 0255  Weight: 42 kg    Examination:  General exam: Appears very deconditioned, cachectic, chronically ill looking, not in any distress. Respiratory system: Decreased breath sounds, respiratory effort normal, no accessory muscle use. RR 15 Cardiovascular system: S1 & S2 heard, regular rate and rhythm, no murmur. Gastrointestinal system: Abdomen is soft, non tender, non distended, BS+ Central nervous system: Alert and oriented x 1, no focal neurological deficits. Extremities: No edema, no cyanosis, no clubbing. Skin: No rashes, lesions or ulcers Psychiatry:  Mood & affect appropriate.     Data Reviewed: I have personally reviewed following labs and imaging studies  CBC: Recent Labs  Lab 01/13/23 0344 01/13/23 0908 01/14/23 0530 01/15/23 0429  WBC 10.3 12.6* 9.1 8.7  NEUTROABS 9.3*  --   --   --   HGB 12.7 12.6 10.4* 10.6*  HCT 38.4 37.5 32.0* 32.6*  MCV 85.3 84.3 87.7 86.7  PLT 411* 318 221 [id number]   Basic Metabolic Panel: Recent Labs  Lab  01/13/23 0344 01/13/23 0908 01/14/23 0530 01/15/23 0429  NA 131*  --  134* 135  K 3.6  --  3.0* 3.1*  CL 92*  --  101 96*  CO2 26  --  24 26  GLUCOSE 415*  --  106* 86  BUN 17  --  14 13  CREATININE 0.62 0.47 0.34* 0.39*  CALCIUM 8.8*  --  8.6* 8.9  MG  --   --   --  1.5*  PHOS  --   --   --  3.0   GFR: Estimated Creatinine Clearance: 32.2 mL/min (A) (by C-G formula based on SCr of 0.39 mg/dL (L)). Liver Function Tests: Recent Labs  Lab 01/13/23 0344 01/14/23 0530 01/15/23 0429  AST 34 22 22  ALT 18 12 14   ALKPHOS 74 58 57  BILITOT 1.1 0.7 1.0  PROT 8.3* 6.3* 6.8  ALBUMIN 3.2* 2.4* 2.4*   Recent Labs  Lab 01/13/23 0342  LIPASE 44   No results for input(s): "AMMONIA" in the last 168 hours. Coagulation Profile: No results for input(s): "INR", "PROTIME" in the last 168  hours. Cardiac Enzymes: No results for input(s): "CKTOTAL", "CKMB", "CKMBINDEX", "TROPONINI" in the last 168 hours. BNP (last 3 results) No results for input(s): "PROBNP" in the last 8760 hours. HbA1C: No results for input(s): "HGBA1C" in the last 72 hours.  CBG: Recent Labs  Lab 01/15/23 2000 01/16/23 0004 01/16/23 0433 01/16/23 0914 01/16/23 1154  GLUCAP 95 106* 104* 102* 104*   Lipid Profile: No results for input(s): "CHOL", "HDL", "LDLCALC", "TRIG", "CHOLHDL", "LDLDIRECT" in the last 72 hours. Thyroid Function Tests: No results for input(s): "TSH", "T4TOTAL", "FREET4", "T3FREE", "THYROIDAB" in the last 72 hours. Anemia Panel: No results for input(s): "VITAMINB12", "FOLATE", "FERRITIN", "TIBC", "IRON", "RETICCTPCT" in the last 72 hours. Sepsis Labs: Recent Labs  Lab 01/13/23 0523 01/13/23 0700 01/13/23 0908 01/15/23 1052  LATICACIDVEN 4.2* 3.9* 2.6* 0.9    Recent Results (from the past 240 hour(s))  Blood culture (routine x 2)     Status: None (Preliminary result)   Collection Time: 01/13/23  3:42 AM   Specimen: BLOOD  Result Value Ref Range Status   Specimen Description    Final    BLOOD BLOOD RIGHT ARM Performed at [facility], [address] [place], [place] [address]    Special Requests   Final    BOTTLES DRAWN AEROBIC AND ANAEROBIC Blood Culture adequate volume Performed at [facility], [address] [place], [place] [address]    Culture   Final    NO GROWTH 3 DAYS Performed at [facility], [place] [address], [place], [place] [address]    Report Status PENDING  Incomplete  Resp panel by RT-PCR (RSV, Flu A&B, Covid) Anterior Nasal Swab     Status: None   Collection Time: 01/13/23  3:45 AM   Specimen: Anterior Nasal Swab  Result Value Ref Range Status   SARS Coronavirus 2 by RT PCR NEGATIVE NEGATIVE Final    Comment: (NOTE) SARS-CoV-2 target nucleic acids are NOT DETECTED.  The SARS-CoV-2 RNA is generally detectable in upper respiratory specimens during the acute phase of infection. The lowest concentration of SARS-CoV-2 viral copies this assay can detect is 138 copies/mL. A negative result does not preclude SARS-Cov-2 infection and should not be used as the sole basis for treatment or other patient management decisions. A negative result may occur with  improper specimen collection/handling, submission of specimen other than nasopharyngeal swab, presence of viral mutation(s) within the areas targeted by this assay, and inadequate number of viral copies(<138 copies/mL). A negative result must be combined with clinical observations, patient history, and epidemiological information. The expected result is Negative.  Fact Sheet for Patients:  EntrepreneurPulse.com.au  Fact Sheet for Healthcare Providers:  IncredibleEmployment.be  This test is no t yet approved or cleared by the [place] FDA and  has been authorized for detection and/or diagnosis of SARS-CoV-2 by FDA under an Emergency Use Authorization (EUA). This EUA will remain  in effect (meaning this test can be used) for  the duration of the COVID-19 declaration under Section 564(b)(1) of the Act, 21 U.S.C.section 360bbb-3(b)(1), unless the authorization is terminated  or revoked sooner.       Influenza A by PCR NEGATIVE NEGATIVE Final   Influenza B by PCR NEGATIVE NEGATIVE Final    Comment: (NOTE) The Xpert Xpress SARS-CoV-2/FLU/RSV plus assay is intended as an aid in the diagnosis of influenza from Nasopharyngeal swab specimens and should not be used as a sole basis for treatment. Nasal washings and aspirates are unacceptable for Xpert Xpress SARS-CoV-2/FLU/RSV testing.  Fact Sheet for Patients: EntrepreneurPulse.com.au  Fact Sheet for Healthcare Providers: IncredibleEmployment.be  This test is not yet approved or cleared by the [place] FDA and has been authorized for detection and/or diagnosis of SARS-CoV-2 by FDA under an Emergency Use Authorization (EUA). This EUA will remain in effect (meaning this test can be used) for the duration of the COVID-19 declaration under Section 564(b)(1) of the Act, 21 U.S.C. section 360bbb-3(b)(1), unless the authorization is terminated or revoked.     Resp Syncytial Virus by PCR NEGATIVE NEGATIVE Final    Comment: (NOTE) Fact Sheet for Patients: EntrepreneurPulse.com.au  Fact Sheet for Healthcare Providers: IncredibleEmployment.be  This test is not yet approved or cleared by the [place] FDA and has been authorized for detection and/or diagnosis of SARS-CoV-2 by FDA under an Emergency Use Authorization (EUA). This EUA will remain in effect (meaning this test can be used) for the duration of the COVID-19 declaration under Section 564(b)(1) of the Act, 21 U.S.C. section 360bbb-3(b)(1), unless the authorization is terminated or revoked.  Performed at [facility], [address] [place], [place] [address]   Blood culture (routine x 2)     Status: None  (Preliminary result)   Collection Time: 01/13/23  9:08 AM   Specimen: BLOOD  Result Value Ref Range Status   Specimen Description   Final    BLOOD BLOOD LEFT HAND Performed at [facility], [address], [place], [place] [address]    Special Requests   Final    BOTTLES DRAWN AEROBIC AND ANAEROBIC Blood Culture results may not be optimal due to an inadequate volume of blood received in culture bottles Performed at [facility], [address], [place], [place] [address]    Culture   Final    NO GROWTH 3 DAYS Performed at [facility], [place] [address], [place], [place] [address]    Report Status PENDING  Incomplete    Radiology Studies: No results found.  Scheduled Meds:  vitamin C  500 mg Oral BID   enoxaparin (LOVENOX) injection  30 mg Subcutaneous Q24H   feeding supplement  237 mL Oral TID BM   insulin aspart  0-9 Units Subcutaneous Q4H   multivitamin with minerals  1 tablet Oral Daily   potassium chloride  40 mEq Oral Once   zinc sulfate  220 mg Oral Daily   Continuous Infusions:  sodium chloride 20 mL/hr at 01/16/23 1000   sodium chloride Stopped (01/15/23 1827)   azithromycin 250 mL/hr at 01/16/23 1000   cefTRIAXone (ROCEPHIN)  IV 200 mL/hr at 01/16/23 1000     LOS: 3 days   Time spent: 35 mins  Duard Brady, MD [facility]   If 7PM-7AM, please contact night-coverage

## 2023-01-17 DIAGNOSIS — E43 Unspecified severe protein-calorie malnutrition: Secondary | ICD-10-CM

## 2023-01-17 DIAGNOSIS — J189 Pneumonia, unspecified organism: Secondary | ICD-10-CM | POA: Diagnosis not present

## 2023-01-17 DIAGNOSIS — G309 Alzheimer's disease, unspecified: Secondary | ICD-10-CM | POA: Diagnosis not present

## 2023-01-17 DIAGNOSIS — J9601 Acute respiratory failure with hypoxia: Secondary | ICD-10-CM | POA: Diagnosis not present

## 2023-01-17 LAB — GLUCOSE, CAPILLARY
Glucose-Capillary: 114 mg/dL — ABNORMAL HIGH (ref 70–99)
Glucose-Capillary: 116 mg/dL — ABNORMAL HIGH (ref 70–99)
Glucose-Capillary: 197 mg/dL — ABNORMAL HIGH (ref 70–99)
Glucose-Capillary: 203 mg/dL — ABNORMAL HIGH (ref 70–99)
Glucose-Capillary: 243 mg/dL — ABNORMAL HIGH (ref 70–99)
Glucose-Capillary: 244 mg/dL — ABNORMAL HIGH (ref 70–99)

## 2023-01-17 LAB — CBC
HCT: 31.9 % — ABNORMAL LOW (ref 36.0–46.0)
Hemoglobin: 10.7 g/dL — ABNORMAL LOW (ref 12.0–15.0)
MCH: 28.2 pg (ref 26.0–34.0)
MCHC: 33.5 g/dL (ref 30.0–36.0)
MCV: 83.9 fL (ref 80.0–100.0)
Platelets: 286 10*3/uL (ref 150–400)
RBC: 3.8 MIL/uL — ABNORMAL LOW (ref 3.87–5.11)
RDW: 13.5 % (ref 11.5–15.5)
WBC: 5.4 10*3/uL (ref 4.0–10.5)
nRBC: 0 % (ref 0.0–0.2)

## 2023-01-17 LAB — MAGNESIUM: Magnesium: 1.4 mg/dL — ABNORMAL LOW (ref 1.7–2.4)

## 2023-01-17 LAB — BASIC METABOLIC PANEL
Anion gap: 9 (ref 5–15)
BUN: 7 mg/dL — ABNORMAL LOW (ref 8–23)
CO2: 27 mmol/L (ref 22–32)
Calcium: 8.5 mg/dL — ABNORMAL LOW (ref 8.9–10.3)
Chloride: 100 mmol/L (ref 98–111)
Creatinine, Ser: 0.36 mg/dL — ABNORMAL LOW (ref 0.44–1.00)
GFR, Estimated: >60 mL/min (ref >60)
Glucose, Bld: 169 mg/dL — ABNORMAL HIGH (ref 70–99)
Potassium: 2.8 mmol/L — ABNORMAL LOW (ref 3.5–5.1)
Sodium: 136 mmol/L (ref 135–145)

## 2023-01-17 LAB — PHOSPHORUS: Phosphorus: 2.2 mg/dL — ABNORMAL LOW (ref 2.5–4.6)

## 2023-01-17 MED ORDER — HYDRALAZINE HCL 50 MG PO TABS
25.0000 mg | ORAL_TABLET | Freq: Three times a day (TID) | ORAL | Status: DC
  Administered 2023-01-17 – 2023-01-22 (×13): 25 mg via ORAL
  Filled 2023-01-17 (×14): qty 1

## 2023-01-17 MED ORDER — POTASSIUM CHLORIDE 20 MEQ PO PACK
40.0000 meq | PACK | ORAL | Status: DC
  Filled 2023-01-17: qty 2

## 2023-01-17 MED ORDER — POTASSIUM CHLORIDE 10 MEQ/100ML IV SOLN
10.0000 meq | INTRAVENOUS | Status: AC
  Administered 2023-01-17 (×4): 10 meq via INTRAVENOUS
  Filled 2023-01-17 (×4): qty 100

## 2023-01-17 MED ORDER — HYDRALAZINE HCL 50 MG PO TABS: 25.0000 mg | ORAL_TABLET | Freq: Three times a day (TID) | ORAL | Status: DC

## 2023-01-17 MED ORDER — MAGNESIUM SULFATE 2 GM/50ML IV SOLN
2.0000 g | Freq: Once | INTRAVENOUS | Status: AC
  Administered 2023-01-17: 2 g via INTRAVENOUS
  Filled 2023-01-17: qty 50

## 2023-01-17 MED ORDER — POTASSIUM CHLORIDE IN NACL 40-0.9 MEQ/L-% IV SOLN
INTRAVENOUS | Status: DC
  Filled 2023-01-17 (×7): qty 1000

## 2023-01-17 NOTE — Progress Notes (Signed)
Speech Language Pathology Treatment: Dysphagia  Patient Details Name: Melanie Turner MRN: [id number] DOB: 11-22-1931 Today's Date: 01/17/2023 Time: 1010-1040 SLP Time Calculation (min) (ACUTE ONLY): 30 min  Assessment / Plan / Recommendation Clinical Impression  Pt seen for ongoing assessment of swallowing today. Pt required full positioning upright in bed; legs pulled up to chest and lying in semi-fetal position in bed on L side. Nonverbal except for mumbled speech - this is fairly Baseline per Family, chart notes. Pt appeared to engage more today w/ eyes open - ongoing IVS and IV meds noted. Son and Daughter present in room. Pt has a Baseline h/o dysphagia d/t impact from Dementia/Cognitive decline and has been recommended to be on a Dysphagia level 1 diet w/ thin liquids in 2020, 2022(per chart notes). Pt also has charted h/o Esophageal phase Dysmotility in setting of a Schatzki's ring in 2022.  Afebrile; WBC WNL. Melanie Turner O2 2L.   Pt appears to present w/ oral phase dysphagia w/ risk for pharyngeal phase dysphagia in setting of declined Cognitive status; Baseline Dementia, dependency on being fed, and positioning challenges. ANY Cognitive decline can impact overall awareness/timing of swallow and safety during po tasks which increases risk for aspiration, choking.  Pt does have risk for aspiration w/ oral intake d/t the Advanced Cognitive decline, dependency being fed, and positioning challenges. Pt's risk for aspiration can be reduced(NOT eliminated) when following aspiration precautions, when being fed and w/ proper positioning, and when using a modified diet consistency of pureed foods. Also, any GI dysmotility can increase risk for retrograde flow and aspiration of REFLUX material. She does require MOD-MAX verbal/tactile cues for oral prep and bolus acceptance currently.        Pt consumed few trials while in room w/ pt/family of purees and thin liquids via cup w/ No immediate, overt clinical s/s of  aspiration noted: no cough and no decline in respiratory status during/post trials. Unable to assess vocal quality. Swallows revealed functional laryngeal excursion though somewhat difficult to assess consistently. Oral phase c/b poor oral prep and bolus acceptance intermittently. When pt became more aware of po trials at lips, she opened mouth more readily to presentation of spoon of puree from Son. Noted increased lingual sweeping and gumming/chewing behaviors b/t trials. Much increased Time noted w/ bolus management of certain more dense Puree foods. Solid foods would not be recommended at this time in setting of pt's oral phase/Cognitive presentation. Grossly functional bolus management and oral clearing of the boluses given noted w/ Time allowed. Verbal cues given for increased oral awareness.          In setting of above presentation, and baseline Dementia and Cognitive decline and diet at home, recommend continue a dysphagia level 1(Pureed foods moistened for ease of oral phase) w/ thin liquids via tsp/cup -- no straws; aspiration precautions; reduce Distractions during meals. Feeding at meals giving Time b/t bites/sips for oral clearing. Oral care b/f and after meals. Pills Crushed in Puree for safer swallowing. MD/NSG updated.  ST services gave Education to Son/Dtr in room; education w/ family and Palliative Care re: general aspiration precautions and diet consistency rec'd at this time and for D/C as the Puree consistency foods will be safest and most efficient for achieving adequate nutrition. Recommend ongoing follow up w/ Palliative Care for Melanie Turner and education re: impact of Cognitive decline/Dementia on swallowing. Suspect pt is close to/at her baseline. Dietician f/u Precautions posted in room. Family agreed. MD updated.  HPI HPI: Pt is a 89 y.o. female with medical history significant of dementia, type 2 diabetes, hypertension presenting with sepsis, acute respiratory failure with  hypoxia, pneumonia, lactic acidosis.  History primarily from patient's son [place].  Patient currently was with [name] who his her primary caregiver.  Patient noted to be bedbound and nonverbal at baseline.  Per report, patient was noted to be more dehydrated and weak appearing by the son.  Patient had 1 major episode of vomiting yesterday.  No reported bloody or bilious emesis.  Patient with overall worsening weakness since last night.  No reported fevers or chills.  No reports of cough or increased work of breathing.  No recent medication changes.  Per Chart: pt had an EGD in 2022 revealing a schatzki's ring, dilation performed.  CT of abdomen:  Airspace disease within the posterior and inferior right middle  lobe and throughout the right lower lobe concerning for multifocal  pneumonia.  3. Moderate stool burden throughout the colon and rectum.      SLP Plan  All goals met      Recommendations for follow up therapy are one component of a multi-disciplinary discharge planning process, led by the attending physician.  Recommendations may be updated based on patient status, additional functional criteria and insurance authorization.    Recommendations  Diet recommendations: Dysphagia 1 (puree);Thin liquid Liquids provided via: Cup;Teaspoon;No straw Medication Administration: Crushed with puree Supervision: Staff to assist with self feeding;Full supervision/cueing for compensatory strategies Compensations: Minimize environmental distractions;Slow rate;Small sips/bites;Lingual sweep for clearance of pocketing;Follow solids with liquid (Time b/t bites/sips) Postural Changes and/or Swallow Maneuvers: Out of bed for meals;Seated upright 90 degrees;Upright 30-60 min after meal                General recommendations:  (Palliative Care, Dietician) Oral Care Recommendations: Oral care QID;Oral care before and after PO;Staff/trained caregiver to provide oral care Follow Up Recommendations: Follow  physician's recommendations for discharge plan and follow up therapies Assistance recommended at discharge: Frequent or constant Supervision/Assistance (baseline) SLP Visit Diagnosis: Dysphagia, oropharyngeal phase (R13.12) (baseline Dementia) Plan: All goals met             Orinda Kenner, MS, CCC-SLP Speech Language Pathologist Rehab Services; [place] [phone] (ascom) Miloh Alcocer  01/17/2023, 3:51 PM

## 2023-01-17 NOTE — TOC CM/SW Note (Signed)
  Transition of Care [facility]) Screening Note   Patient Details  Name: Melanie Turner Date of Birth: 1932-07-17   Transition of Care [facility]) CM/SW Contact:    Candie Chroman, LCSW Phone Number: 01/17/2023, 11:11 AM    Transition of Care Department [facility]) has reviewed patient and no TOC needs have been identified at this time. We will continue to monitor patient advancement through interdisciplinary progression rounds. If new patient transition needs arise, please place a TOC consult.

## 2023-01-17 NOTE — Progress Notes (Signed)
PROGRESS NOTE    Melanie Turner  [id number] DOB: 05-12-1932 DOA: 01/13/2023 PCP: Kirk Ruths, MD    Brief Narrative:  This 89 yrs old female with PMH significant for dementia, type 2 diabetes, hypertension presented in the ED with generalized weakness.  History is obtained from son.  Patient noted to be bedbound and nonverbal at baseline.  Son reports patient was noted to be more dehydrated and weak .  She had an episode of vomiting yesterday and been feeling generally weak afterwards.  He denies any fever or chills.  ED workup reveals temp. 99.6, heart rate 100, respiratory rate 20, blood pressure 160/90.  She was hypoxic requiring 2 L of supplemental oxygen.  Labs include WBC 10.3, Lactic acid 4.2, Chest x-ray showed hazy opacity in the right lower lung.  CT abdomen and pelvis showed multi lobar pneumonia as well as moderate stool burden.  Patient is admitted for sepsis likely secondary to decubitus ulcers and multifocal pneumonia.  Assessment & Plan:   Principal Problem:   Acute respiratory failure with hypoxia (HCC) Active Problems:   Sepsis ([place])   Pneumonia   Hyperglycemia due to type 2 diabetes mellitus (HCC)   Benign essential HTN   Dementia (HCC)   Lactic acidosis   Protein-calorie malnutrition, severe  Acute hypoxic respiratory failure: Likely secondary to multifocal pneumonia. Patient does not use oxygen at baseline. Chest x-ray showed hazy opacity in the right lower lung. Continue IV empiric antibiotics ( ceftriaxone and Zithromax) x 5 days. Continue supplemental oxygen and wean as tolerated.  Severe sepsis sec. to multifocal pneumonia: Patient presented with tachycardia,  tachypnea,  lactic acidosis, leukocytosis. Chest x-ray showed multifocal pneumonia. COVID, Influenza, RSV negative. Blood cultures No growth to date, urine culture contaminated. Urinary strep and Legionella antigen > Negative Continue antibiotics x 5 days. Continue IV fluid  resuscitation. Lactic acid improved with IV fluid resuscitation.  Multifocal pneumonia: Continue ceftriaxone and Zithromax.  Type 2 diabetes: She is found to have blood sugar of 400 on presentation. Continue regular insulin sliding scale. Speech evaluation recommended Dysphagia 1 thin liquids.  Lactic acidosis: Likely in the setting of  multifocal pneumonia. Continue IV fluid resuscitation, Lactic acid improved.  Dementia, Advanced: Patient does have end-stage dementia with minimal function at home as well as decreased p.o. intake.  Continue Aricept, Remeron and trazodone.  Speech and swallow recommended n.p.o. due to high risk for aspiration. Palliative care consulted to discuss goals of care. Family wants to treat the treatable.  Hypertension: Continue hydralazine as needed for hypertension.  Decubitus Bed ulcers: Continue wound care. Change body posture q4hrs  DVT prophylaxis: Lovenox Code Status: Full code Family Communication: Son at bed side. Disposition Plan:   Status is: Inpatient Remains inpatient appropriate because: Admitted for multiple problems, severe sepsis secondary to multifocal pneumonia,  acute hypoxic respiratory failure requiring supplemental oxygen. Palliative care consulted to discuss goals of care.   Consultants:  Palliative Care  Procedures: NONE  Antimicrobials:  Anti-infectives (From admission, onward)    Start     Dose/Rate Route Frequency Ordered Stop   01/14/23 0600  cefTRIAXone (ROCEPHIN) 2 g in sodium chloride 0.9 % 100 mL IVPB        2 g 200 mL/hr over 30 Minutes Intravenous Every 24 hours 01/13/23 0823 01/19/23 0559   01/14/23 0600  azithromycin (ZITHROMAX) 500 mg in sodium chloride 0.9 % 250 mL IVPB        500 mg 250 mL/hr over 60 Minutes Intravenous Every 24 hours  01/13/23 0823 01/19/23 0559   01/13/23 0400  cefTRIAXone (ROCEPHIN) 2 g in sodium chloride 0.9 % 100 mL IVPB        2 g 200 mL/hr over 30 Minutes Intravenous  Once  01/13/23 0350 01/13/23 0510   01/13/23 0400  azithromycin (ZITHROMAX) 500 mg in sodium chloride 0.9 % 250 mL IVPB        500 mg 250 mL/hr over 60 Minutes Intravenous  Once 01/13/23 0350 01/13/23 [id number]      Subjective: Patient was seen and examined at bedside. Overnight events noted.  Patient remains nonverbal, remains on the right side.  She is arousable, following partial commands. Son and daughter at bedside,  states she is at her baseline now.  Objective: Vitals:   01/17/23 0703 01/17/23 0719 01/17/23 0744 01/17/23 0843  BP: (!) 183/93 (!) 174/98 (!) 170/96 128/60  Pulse: 100 (!) 105    Resp: 19 18    Temp: 98.3 F (36.8 C) 98.3 F (36.8 C)    TempSrc:  Tympanic    SpO2: 100% 100%    Weight:      Height:        Intake/Output Summary (Last 24 hours) at 01/17/2023 1338 Last data filed at 01/17/2023 1057 Gross per 24 hour  Intake 478.23 ml  Output --  Net 478.23 ml   Filed Weights   01/13/23 0255  Weight: 42 kg    Examination:  General exam: Appears deconditioned, cachectic, chronically ill looking, NAD Respiratory system: Decreased breath sounds, respiratory effort normal, RR 15 Cardiovascular system: S1 & S2 heard, regular rate and rhythm, no murmur. Gastrointestinal system: Abdomen is soft, non tender, non distended, BS+ Central nervous system: Alert and oriented x 1, no focal neurological deficits. Extremities: No edema, no cyanosis, no clubbing. Skin: No rashes, lesions or ulcers Psychiatry:  Mood & affect appropriate.     Data Reviewed: I have personally reviewed following labs and imaging studies  CBC: Recent Labs  Lab 01/13/23 0344 01/13/23 0908 01/14/23 0530 01/15/23 0429 01/17/23 0626  WBC 10.3 12.6* 9.1 8.7 5.4  NEUTROABS 9.3*  --   --   --   --   HGB 12.7 12.6 10.4* 10.6* 10.7*  HCT 38.4 37.5 32.0* 32.6* 31.9*  MCV 85.3 84.3 87.7 86.7 83.9  PLT 411* 318 221 259 [id number]   Basic Metabolic Panel: Recent Labs  Lab 01/13/23 0344 01/13/23 0908  01/14/23 0530 01/15/23 0429 01/17/23 0626  NA 131*  --  134* 135 136  K 3.6  --  3.0* 3.1* 2.8*  CL 92*  --  101 96* 100  CO2 26  --  24 26 27   GLUCOSE 415*  --  106* 86 169*  BUN 17  --  14 13 7*  CREATININE 0.62 0.47 0.34* 0.39* 0.36*  CALCIUM 8.8*  --  8.6* 8.9 8.5*  MG  --   --   --  1.5* 1.4*  PHOS  --   --   --  3.0 2.2*   GFR: Estimated Creatinine Clearance: 32.2 mL/min (A) (by C-G formula based on SCr of 0.36 mg/dL (L)). Liver Function Tests: Recent Labs  Lab 01/13/23 0344 01/14/23 0530 01/15/23 0429  AST 34 22 22  ALT 18 12 14   ALKPHOS 74 58 57  BILITOT 1.1 0.7 1.0  PROT 8.3* 6.3* 6.8  ALBUMIN 3.2* 2.4* 2.4*   Recent Labs  Lab 01/13/23 0342  LIPASE 44   No results for input(s): "AMMONIA" in the last 168 hours.  Coagulation Profile: No results for input(s): "INR", "PROTIME" in the last 168 hours. Cardiac Enzymes: No results for input(s): "CKTOTAL", "CKMB", "CKMBINDEX", "TROPONINI" in the last 168 hours. BNP (last 3 results) No results for input(s): "PROBNP" in the last 8760 hours. HbA1C: No results for input(s): "HGBA1C" in the last 72 hours.  CBG: Recent Labs  Lab 01/16/23 2050 01/17/23 0020 01/17/23 0409 01/17/23 0830 01/17/23 1153  GLUCAP 295* 116* 197* 114* 244*   Lipid Profile: No results for input(s): "CHOL", "HDL", "LDLCALC", "TRIG", "CHOLHDL", "LDLDIRECT" in the last 72 hours. Thyroid Function Tests: No results for input(s): "TSH", "T4TOTAL", "FREET4", "T3FREE", "THYROIDAB" in the last 72 hours. Anemia Panel: No results for input(s): "VITAMINB12", "FOLATE", "FERRITIN", "TIBC", "IRON", "RETICCTPCT" in the last 72 hours. Sepsis Labs: Recent Labs  Lab 01/13/23 0523 01/13/23 0700 01/13/23 0908 01/15/23 1052  LATICACIDVEN 4.2* 3.9* 2.6* 0.9    Recent Results (from the past 240 hour(s))  Blood culture (routine x 2)     Status: None (Preliminary result)   Collection Time: 01/13/23  3:42 AM   Specimen: BLOOD  Result Value Ref Range  Status   Specimen Description BLOOD BLOOD RIGHT ARM  Final   Special Requests   Final    BOTTLES DRAWN AEROBIC AND ANAEROBIC Blood Culture adequate volume   Culture   Final    NO GROWTH 4 DAYS Performed at [facility], [address] [place], [place] [address]    Report Status PENDING  Incomplete  Resp panel by RT-PCR (RSV, Flu A&B, Covid) Anterior Nasal Swab     Status: None   Collection Time: 01/13/23  3:45 AM   Specimen: Anterior Nasal Swab  Result Value Ref Range Status   SARS Coronavirus 2 by RT PCR NEGATIVE NEGATIVE Final    Comment: (NOTE) SARS-CoV-2 target nucleic acids are NOT DETECTED.  The SARS-CoV-2 RNA is generally detectable in upper respiratory specimens during the acute phase of infection. The lowest concentration of SARS-CoV-2 viral copies this assay can detect is 138 copies/mL. A negative result does not preclude SARS-Cov-2 infection and should not be used as the sole basis for treatment or other patient management decisions. A negative result may occur with  improper specimen collection/handling, submission of specimen other than nasopharyngeal swab, presence of viral mutation(s) within the areas targeted by this assay, and inadequate number of viral copies(<138 copies/mL). A negative result must be combined with clinical observations, patient history, and epidemiological information. The expected result is Negative.  Fact Sheet for Patients:  EntrepreneurPulse.com.au  Fact Sheet for Healthcare Providers:  IncredibleEmployment.be  This test is no t yet approved or cleared by the [place] FDA and  has been authorized for detection and/or diagnosis of SARS-CoV-2 by FDA under an Emergency Use Authorization (EUA). This EUA will remain  in effect (meaning this test can be used) for the duration of the COVID-19 declaration under Section 564(b)(1) of the Act, 21 U.S.C.section 360bbb-3(b)(1), unless the  authorization is terminated  or revoked sooner.       Influenza A by PCR NEGATIVE NEGATIVE Final   Influenza B by PCR NEGATIVE NEGATIVE Final    Comment: (NOTE) The Xpert Xpress SARS-CoV-2/FLU/RSV plus assay is intended as an aid in the diagnosis of influenza from Nasopharyngeal swab specimens and should not be used as a sole basis for treatment. Nasal washings and aspirates are unacceptable for Xpert Xpress SARS-CoV-2/FLU/RSV testing.  Fact Sheet for Patients: EntrepreneurPulse.com.au  Fact Sheet for Healthcare Providers: IncredibleEmployment.be  This test is not yet  approved or cleared by the [place] and has been authorized for detection and/or diagnosis of SARS-CoV-2 by FDA under an Emergency Use Authorization (EUA). This EUA will remain in effect (meaning this test can be used) for the duration of the COVID-19 declaration under Section 564(b)(1) of the Act, 21 U.S.C. section 360bbb-3(b)(1), unless the authorization is terminated or revoked.     Resp Syncytial Virus by PCR NEGATIVE NEGATIVE Final    Comment: (NOTE) Fact Sheet for Patients: EntrepreneurPulse.com.au  Fact Sheet for Healthcare Providers: IncredibleEmployment.be  This test is not yet approved or cleared by the [place] FDA and has been authorized for detection and/or diagnosis of SARS-CoV-2 by FDA under an Emergency Use Authorization (EUA). This EUA will remain in effect (meaning this test can be used) for the duration of the COVID-19 declaration under Section 564(b)(1) of the Act, 21 U.S.C. section 360bbb-3(b)(1), unless the authorization is terminated or revoked.  Performed at [facility], [address] [place], [place] [address]   Blood culture (routine x 2)     Status: None (Preliminary result)   Collection Time: 01/13/23  9:08 AM   Specimen: BLOOD  Result Value Ref Range Status   Specimen  Description BLOOD BLOOD LEFT HAND  Final   Special Requests   Final    BOTTLES DRAWN AEROBIC AND ANAEROBIC Blood Culture results may not be optimal due to an inadequate volume of blood received in culture bottles   Culture   Final    NO GROWTH 4 DAYS Performed at [facility], [address], [place], [place] [address]    Report Status PENDING  Incomplete    Radiology Studies: No results found.  Scheduled Meds:  enoxaparin (LOVENOX) injection  30 mg Subcutaneous Q24H   feeding supplement  237 mL Oral TID BM   hydrALAZINE  25 mg Oral Q8H   insulin aspart  0-9 Units Subcutaneous Q4H   potassium chloride  40 mEq Oral Q4H   Continuous Infusions:  sodium chloride 50 mL/hr at 01/17/23 [id number]   sodium chloride Stopped (01/15/23 1827)   azithromycin 500 mg (01/17/23 [id number])   cefTRIAXone (ROCEPHIN)  IV 2 g (01/17/23 0535)   magnesium sulfate bolus IVPB       LOS: 4 days   Time spent: 35 mins  Duard Brady, MD [facility]   If 7PM-7AM, please contact night-coverage

## 2023-01-17 NOTE — Progress Notes (Signed)
Palliative Care Progress Note, Assessment & Plan   [name]       Date: 01/17/2023 DOB: November 07, 1931  Age: 89 y.o. MRN#: [id number] Attending Physician: Duard Brady, MD Primary Care Physician: Kirk Ruths, MD Admit Date: 01/13/2023  Subjective: Patient is sitting up in bed.  She acknowledges my patients but is nonverbal during my encounter.  Her son and daughter at bedside.  HPI: 89 y.o. female  with past medical history of dementia, type 2 diabetes, right hip arthroplasty (2021), thyroidectomy, and HTN admitted on 01/13/2023 with weakness, 1 episode of vomiting, and dehydration.   COVID, flu, and RSV negative.  CT of abdomen revealed right-sided multilobar pneumonia as well as a moderate stool burden.  Patient is being treated with IV Rocephin and azithromycin and IVF.    PMT was consulted to discuss goals of care.   Summary of counseling/coordination of care: After reviewing the patient's chart and assessing the patient at bedside, I spoke with patient, her son Myna Bright, and her daughter Dionza in regards to plan and goals of care.  Symptoms assessed.  Patient is unable to verbalize responses to questions.  Physical assessment does not reveal any nonverbal signs of pain or discomfort.  Family endorses patient had a quiet and uneventful night and does not appear to be in any pain or distress at this time.  I attempted to elicit goals and values important to patient and family.  Family shares they are feeling encouraged that patient is eating more.  They are looking to take her home soon as possible.   We discussed advanced care planning and boundaries of care.  Functional, nutritional, and cognitive status discussed as significant indicators of patient's overall prognosis.    Specifically, we discussed importance of nutrition with SLP therapist Belenda Cruise at bedside.  Hopeful expectations vs realistic goals discussed. I again outlined that dementia is a chronic, progressive, and irreversible disease.   We reviewed that patient is being hydrated artificially with IV fluids during hospitalization.  Once artificial hydration is removed, I shared concern that patient will not be able to maintain adequate fluid intake and potnetially become dehydrated again.  I encouraged patient and family to have small sips of water throughout the day as well as at least 1 cup of water with every meal. However, I also cautioned them on forcing too much food/drink at one time to avoid aspiration.   Goals are set. Family remains in agreement with DNR and wants to treat the treatable with plans to d/c home.  PMT contact info given and family encouraged to call with any future palliative needs.  PMT will monitor the patient peripherally and will re-engage at patient/family's request, if patient's goals change, or if patient's health deteriorates during hospitalization.  Physical Exam Vitals reviewed.  Constitutional:      General: She is not in acute distress.    Appearance: She is not ill-appearing.     Comments: Thin, frail  HENT:     Mouth/Throat:     Mouth: Mucous membranes are moist.  Cardiovascular:     Rate and Rhythm: Normal rate.  Pulmonary:     Effort: Pulmonary effort is normal.  Abdominal:  Palpations: Abdomen is soft.  Skin:    General: Skin is warm.  Neurological:     Mental Status: She is alert.     Comments: nonverbal             Total Time 35 minutes   Nakima Fluegge L. Ilsa Iha, FNP-BC Palliative Medicine Team Team Phone # [phone]

## 2023-01-18 ENCOUNTER — Other Ambulatory Visit: Payer: Self-pay

## 2023-01-18 DIAGNOSIS — J9601 Acute respiratory failure with hypoxia: Secondary | ICD-10-CM | POA: Diagnosis not present

## 2023-01-18 LAB — CULTURE, BLOOD (ROUTINE X 2)
Culture: NO GROWTH
Culture: NO GROWTH
Special Requests: ADEQUATE

## 2023-01-18 LAB — CBC
HCT: 30.1 % — ABNORMAL LOW (ref 36.0–46.0)
Hemoglobin: 9.9 g/dL — ABNORMAL LOW (ref 12.0–15.0)
MCH: 27.9 pg (ref 26.0–34.0)
MCHC: 32.9 g/dL (ref 30.0–36.0)
MCV: 84.8 fL (ref 80.0–100.0)
Platelets: 286 10*3/uL (ref 150–400)
RBC: 3.55 MIL/uL — ABNORMAL LOW (ref 3.87–5.11)
RDW: 13.7 % (ref 11.5–15.5)
WBC: 5 10*3/uL (ref 4.0–10.5)
nRBC: 0 % (ref 0.0–0.2)

## 2023-01-18 LAB — BASIC METABOLIC PANEL
Anion gap: 3 — ABNORMAL LOW (ref 5–15)
BUN: 10 mg/dL (ref 8–23)
CO2: 28 mmol/L (ref 22–32)
Calcium: 8.4 mg/dL — ABNORMAL LOW (ref 8.9–10.3)
Chloride: 104 mmol/L (ref 98–111)
Creatinine, Ser: <0.3 mg/dL — ABNORMAL LOW (ref 0.44–1.00)
Glucose, Bld: 102 mg/dL — ABNORMAL HIGH (ref 70–99)
Potassium: 3.8 mmol/L (ref 3.5–5.1)
Sodium: 135 mmol/L (ref 135–145)

## 2023-01-18 LAB — GLUCOSE, CAPILLARY
Glucose-Capillary: 109 mg/dL — ABNORMAL HIGH (ref 70–99)
Glucose-Capillary: 135 mg/dL — ABNORMAL HIGH (ref 70–99)
Glucose-Capillary: 154 mg/dL — ABNORMAL HIGH (ref 70–99)
Glucose-Capillary: 164 mg/dL — ABNORMAL HIGH (ref 70–99)
Glucose-Capillary: 200 mg/dL — ABNORMAL HIGH (ref 70–99)
Glucose-Capillary: 238 mg/dL — ABNORMAL HIGH (ref 70–99)

## 2023-01-18 LAB — MAGNESIUM: Magnesium: 1.8 mg/dL (ref 1.7–2.4)

## 2023-01-18 LAB — PHOSPHORUS: Phosphorus: 2.7 mg/dL (ref 2.5–4.6)

## 2023-01-18 MED ORDER — VITAMIN C 500 MG PO TABS
500.0000 mg | ORAL_TABLET | Freq: Two times a day (BID) | ORAL | Status: DC
  Administered 2023-01-18 – 2023-01-22 (×8): 500 mg via ORAL
  Filled 2023-01-18 (×8): qty 1

## 2023-01-18 MED ORDER — ZINC SULFATE 220 (50 ZN) MG PO CAPS
220.0000 mg | ORAL_CAPSULE | Freq: Every day | ORAL | Status: DC
  Administered 2023-01-18 – 2023-01-22 (×5): 220 mg via ORAL
  Filled 2023-01-18 (×5): qty 1

## 2023-01-18 MED ORDER — ADULT MULTIVITAMIN W/MINERALS CH
1.0000 | ORAL_TABLET | Freq: Every day | ORAL | Status: DC
  Administered 2023-01-18 – 2023-01-22 (×5): 1 via ORAL
  Filled 2023-01-18 (×5): qty 1

## 2023-01-18 NOTE — Progress Notes (Signed)
                                                     Palliative Care Progress Note   [name]       Date: 01/18/2023 DOB: [birth year]  Age: 89 y.o. MRN#: [id number] Attending Physician: Emeterio Reeve, DO Primary Care Physician: Kirk Ruths, MD Admit Date: 01/13/2023  Chart reviewed.  No acute palliative needs today. DNR remains.  Plan to treat the treatable. Patient/family have PMT contact info for any palliative needs.    PMT will continue to shadow/follow peripherally.  [place] Ilsa Iha, FNP-BC Palliative Medicine Team Team Phone # [phone]  NO CHARGE

## 2023-01-18 NOTE — Progress Notes (Addendum)
Nutrition Follow-up  DOCUMENTATION CODES:   Severe malnutrition in context of chronic illness, Underweight  INTERVENTION:   -Continue Ensure Enlive po TID, each supplement provides 350 kcal and 20 grams of protein -MVI with minerals daily -500 mg vitamin C BID -220 mg zinc sulfate daily x 14 days -Magic cup TID with meals, each supplement provides 290 kcal and 9 grams of protein   NUTRITION DIAGNOSIS:   Severe Malnutrition related to chronic illness (dementia) as evidenced by severe fat depletion, severe muscle depletion.  Ongoing  GOAL:   Patient will meet greater than or equal to 90% of their needs  Progressing   MONITOR:   PO intake, Supplement acceptance  REASON FOR ASSESSMENT:   Malnutrition Screening Tool, Consult Assessment of nutrition requirement/status  ASSESSMENT:   Pt with medical history significant of dementia, type 2 diabetes, hypertension presenting with sepsis, acute respiratory failure with hypoxia, pneumonia, lactic acidosis.  3/15- s/p BSE- recommend NPO 3/18- s/p BSE- upgraded to dysphagia 1 diet with thin liquids   Reviewed I/O's: +2.5 L x 24 hours and +6.2 L since admission  Case discussed with SLP. Pt remains with minimal intake. Pt has been downgraded to a dysphagia 1 diet with thin liquids. Pt to remains on this diet for ease of intake. Noted pt is refusing Ensure supplements.   No new wt since admission.   Palliative care following for goals of care discussions; plan to eat the treatable. Per MD notes, pt is open to talk about hospice. Due to pt advanced age and dementia, would not recommend alternative means of nutrition/ hydration (ex PEG) as this would not enhance pt's quality of life.   Medications reviewed and include 0.9% NaCl with KCl 40 mEq/L infusion @ 50 ml/hr.   Labs reviewed: CBGS: 135-200 (inpatient orders for glycemic control are 0-9 units insulin aspart every 4 hours).    Diet Order:   Diet Order             DIET -  DYS 1 Room service appropriate? Yes with Assist; Fluid consistency: Thin  Diet effective now                   EDUCATION NEEDS:   Education needs have been addressed  Skin:  Skin Assessment: Skin Integrity Issues: Skin Integrity Issues:: Stage II, Stage I, Stage III, Unstageable Stage I: vertebral column, rt hip Stage II: rt tibial, upper anus Stage III: rt hip, sacrum Unstageable: rt ankle  Last BM:  01/18/23 (type 6)  Height:   Ht Readings from Last 1 Encounters:  01/13/23 5\' 2"  (1.575 m)    Weight:   Wt Readings from Last 1 Encounters:  01/13/23 42 kg    Ideal Body Weight:  50 kg  BMI:  Body mass index is 16.94 kg/m.  Estimated Nutritional Needs:   Kcal:  1500-1700  Protein:  85-100 grams  Fluid:  > 1.5 L    Loistine Chance, RD, LDN, [place] Registered Dietitian II Certified Diabetes Care and Education Specialist Please refer to [facility] for RD and/or RD on-call/weekend/after hours pager

## 2023-01-18 NOTE — TOC CM/SW Note (Signed)
CSW acknowledges consult for home health/DME needs. Attending will enter PT and OT consults so she can be evaluated.  Dayton Scrape, [place]

## 2023-01-18 NOTE — Progress Notes (Signed)
PROGRESS NOTE    Melanie Turner   [id number] DOB: December 13, 1931  DOA: 01/13/2023 Date of Service: 01/18/23 PCP: Kirk Ruths, MD     Brief Narrative / Hospital Course:  This 89 yrs old female with PMH significant for dementia, type 2 diabetes, hypertension presented in the ED 01/13/2023 with generalized weakness. History was obtained from son. Patient noted to be bedbound and nonverbal at baseline. Son reported patient was noted to be more dehydrated and weak . She had an episode of vomiting 03/14 and been feeling generally weak afterwards. He denied any fever or chills.  03/15: ED workup reveals temp. 99.6, heart rate 100, respiratory rate 20, blood pressure 160/90. She was hypoxic requiring 2 L of supplemental oxygen. Labs include WBC 10.3, Lactic acid 4.2, Chest x-ray showed hazy opacity in the right lower lung. CT abdomen and pelvis showed multi lobar pneumonia as well as moderate stool burden. Patient is admitted for sepsis likely secondary to decubitus ulcers and multifocal pneumonia. Started ceftriaxone and azithromycin, supplemental O2  03/16: relatively stable on O2, awaiting cultures.  03/17: BCx NGx2d. IPAL note --> DNR.  03/18: SLP recs NPO d/t high aspiration risk. Palliative care saw patient.  03/19: Palliative and SLP d/w family again, goals are set. Family remains in agreement with DNR and wants to treat the treatable with plans to d/c home.  03/20: confirmed w/ son that goal is for patient to go home, we discussed hospice at home as a potential source of support, he is willing to talk to them   Consultants:  Palliative Care   Procedures: None       ASSESSMENT & PLAN:   Principal Problem:   Acute respiratory failure with hypoxia ([place]) Active Problems:   Sepsis ([place])   Pneumonia   Hyperglycemia due to type 2 diabetes mellitus ([place])   Benign essential HTN   Dementia ([place])   Lactic acidosis   Protein-calorie malnutrition, severe  Acute hypoxic  respiratory failure: Likely secondary to multifocal pneumonia.  Patient does not use oxygen at baseline. Continue supplemental oxygen and wean as tolerated.   Severe sepsis sec. to multifocal pneumonia: Patient presented with tachycardia,  tachypnea,  lactic acidosis, leukocytosis. Chest x-ray showed multifocal pneumonia. COVID, Influenza, RSV negative. Blood cultures No growth to date, urine culture contaminated. Urinary strep and Legionella antigen > Negative Continue antibiotics x 5 days. Continue IV fluid - pt has not been eating. Lactic acid improved with IV fluid resuscitation.   Type 2 diabetes: She is found to have blood sugar of 400 on presentation. Continue regular insulin sliding scale. Speech evaluation recommended Dysphagia 1 thin liquids.   Lactic acidosis: resolved Likely in the setting of  multifocal pneumonia.   Dementia, Advanced: Patient does have end-stage dementia with minimal function at home as well as decreased p.o. intake.  Continue Aricept, Remeron and trazodone.   Speech and swallow recommended n.p.o. due to high risk for aspiration. Palliative care consulted to discuss goals of care. Family wants to treat the treatable but is open to speaking w/ hospice - goal is to take patient home    Hypertension: Continue hydralazine as needed for hypertension.   Decubitus Bed ulcers: Continue wound care. Change body posture q4hrs     DVT prophylaxis: SCD Pertinent IV fluids/nutrition: dysphagia diet but po intake has been minimal  Central lines / invasive devices: none  Code Status: DNR  Current Admission Status: inpatient  TOC needs / Dispo plan: home health / possible hospice Barriers to discharge /  significant pending items: hospice eval              Subjective / Brief ROS:  Patient not contributing to interview - she is resting   Family Communication: discussed w/ son at bedside on rounds - confirmed goal it to take her home, she  will have 24/7 care from family. We discussed poor prognosis if she isn't taking in po / risk of aspiration. Discussed option for home w/ hospice and formulate a plan for when or if to bring her to the hospital in the future.     Objective Findings:  Vitals:   01/17/23 1948 01/18/23 0453 01/18/23 0726 01/18/23 1556  BP: (!) 166/82 (!) 162/84 (!) 137/94 (!) 162/109  Pulse: 97 92 (!) 101 82  Resp: 18 18 16 16   Temp: 99 F (37.2 C) 98.7 F (37.1 C) 98.2 F (36.8 C) 98.1 F (36.7 C)  TempSrc:   Oral Oral  SpO2: 100% 97% 100% 100%  Weight:      Height:        Intake/Output Summary (Last 24 hours) at 01/18/2023 1623 Last data filed at 01/18/2023 1017 Gross per 24 hour  Intake 1407.59 ml  Output --  Net 1407.59 ml   Filed Weights   01/13/23 0255  Weight: 42 kg    Examination:  Physical Exam Constitutional:      General: She is not in acute distress.    Appearance: She is ill-appearing. She is not toxic-appearing.  HENT:     Mouth/Throat:     Mouth: Mucous membranes are dry.  Cardiovascular:     Rate and Rhythm: Normal rate and regular rhythm.  Pulmonary:     Breath sounds: Normal breath sounds.  Abdominal:     Palpations: Abdomen is soft.  Neurological:     Mental Status: Mental status is at baseline.          Scheduled Medications:   enoxaparin (LOVENOX) injection  30 mg Subcutaneous Q24H   feeding supplement  237 mL Oral TID BM   hydrALAZINE  25 mg Oral Q8H   insulin aspart  0-9 Units Subcutaneous Q4H    Continuous Infusions:  sodium chloride Stopped (01/15/23 1827)   0.9 % NaCl with KCl 40 mEq / L 50 mL/hr at 01/18/23 1604    PRN Medications:  sodium chloride, hydrALAZINE, ondansetron **OR** ondansetron (ZOFRAN) IV  Antimicrobials from admission:  Anti-infectives (From admission, onward)    Start     Dose/Rate Route Frequency Ordered Stop   01/14/23 0600  cefTRIAXone (ROCEPHIN) 2 g in sodium chloride 0.9 % 100 mL IVPB        2 g 200 mL/hr over  30 Minutes Intravenous Every 24 hours 01/13/23 0823 01/18/23 0631   01/14/23 0600  azithromycin (ZITHROMAX) 500 mg in sodium chloride 0.9 % 250 mL IVPB        500 mg 250 mL/hr over 60 Minutes Intravenous Every 24 hours 01/13/23 0823 01/18/23 1017   01/13/23 0400  cefTRIAXone (ROCEPHIN) 2 g in sodium chloride 0.9 % 100 mL IVPB        2 g 200 mL/hr over 30 Minutes Intravenous  Once 01/13/23 0350 01/13/23 0510   01/13/23 0400  azithromycin (ZITHROMAX) 500 mg in sodium chloride 0.9 % 250 mL IVPB        500 mg 250 mL/hr over 60 Minutes Intravenous  Once 01/13/23 0350 01/13/23 [id number]           Data Reviewed:  I have personally  reviewed the following...  CBC: Recent Labs  Lab 01/13/23 0344 01/13/23 0908 01/14/23 0530 01/15/23 0429 01/17/23 0626 01/18/23 0555  WBC 10.3 12.6* 9.1 8.7 5.4 5.0  NEUTROABS 9.3*  --   --   --   --   --   HGB 12.7 12.6 10.4* 10.6* 10.7* 9.9*  HCT 38.4 37.5 32.0* 32.6* 31.9* 30.1*  MCV 85.3 84.3 87.7 86.7 83.9 84.8  PLT 411* 318 221 259 286 [id number]   Basic Metabolic Panel: Recent Labs  Lab 01/13/23 0344 01/13/23 0908 01/14/23 0530 01/15/23 0429 01/17/23 0626 01/18/23 0555  NA 131*  --  134* 135 136 135  K 3.6  --  3.0* 3.1* 2.8* 3.8  CL 92*  --  101 96* 100 104  CO2 26  --  24 26 27 28   GLUCOSE 415*  --  106* 86 169* 102*  BUN 17  --  14 13 7* 10  CREATININE 0.62 0.47 0.34* 0.39* 0.36* <0.30*  CALCIUM 8.8*  --  8.6* 8.9 8.5* 8.4*  MG  --   --   --  1.5* 1.4* 1.8  PHOS  --   --   --  3.0 2.2* 2.7   GFR: CrCl cannot be calculated (This lab value cannot be used to calculate CrCl because it is not a number: <0.30). Liver Function Tests: Recent Labs  Lab 01/13/23 0344 01/14/23 0530 01/15/23 0429  AST 34 22 22  ALT 18 12 14   ALKPHOS 74 58 57  BILITOT 1.1 0.7 1.0  PROT 8.3* 6.3* 6.8  ALBUMIN 3.2* 2.4* 2.4*   Recent Labs  Lab 01/13/23 0342  LIPASE 44   No results for input(s): "AMMONIA" in the last 168 hours. Coagulation Profile: No  results for input(s): "INR", "PROTIME" in the last 168 hours. Cardiac Enzymes: No results for input(s): "CKTOTAL", "CKMB", "CKMBINDEX", "TROPONINI" in the last 168 hours. BNP (last 3 results) No results for input(s): "PROBNP" in the last 8760 hours. HbA1C: No results for input(s): "HGBA1C" in the last 72 hours. CBG: Recent Labs  Lab 01/18/23 0004 01/18/23 0406 01/18/23 0726 01/18/23 1154 01/18/23 1556  GLUCAP 238* 109* 135* 154* 200*   Lipid Profile: No results for input(s): "CHOL", "HDL", "LDLCALC", "TRIG", "CHOLHDL", "LDLDIRECT" in the last 72 hours. Thyroid Function Tests: No results for input(s): "TSH", "T4TOTAL", "FREET4", "T3FREE", "THYROIDAB" in the last 72 hours. Anemia Panel: No results for input(s): "VITAMINB12", "FOLATE", "FERRITIN", "TIBC", "IRON", "RETICCTPCT" in the last 72 hours. Most Recent Urinalysis On File:     Component Value Date/Time   COLORURINE YELLOW (A) 01/13/2023 0405   APPEARANCEUR HAZY (A) 01/13/2023 0405   APPEARANCEUR Clear 06/30/2014 1501   LABSPEC 1.028 01/13/2023 0405   LABSPEC 1.011 06/30/2014 1501   PHURINE 6.0 01/13/2023 0405   GLUCOSEU >=500 (A) 01/13/2023 0405   GLUCOSEU Negative 06/30/2014 1501   HGBUR NEGATIVE 01/13/2023 0405   BILIRUBINUR NEGATIVE 01/13/2023 0405   BILIRUBINUR Negative 06/30/2014 1501   KETONESUR 5 (A) 01/13/2023 0405   PROTEINUR 30 (A) 01/13/2023 0405   NITRITE NEGATIVE 01/13/2023 0405   LEUKOCYTESUR NEGATIVE 01/13/2023 0405   LEUKOCYTESUR Trace 06/30/2014 1501   Sepsis Labs: @LABRCNTIP (procalcitonin:4,lacticidven:4) Microbiology: Recent Results (from the past 240 hour(s))  Blood culture (routine x 2)     Status: None   Collection Time: 01/13/23  3:42 AM   Specimen: BLOOD  Result Value Ref Range Status   Specimen Description   Final    BLOOD BLOOD RIGHT ARM Performed at [facility], [address] [place], [place] [address]    Special Requests   Final    BOTTLES DRAWN AEROBIC AND ANAEROBIC  Blood Culture adequate volume Performed at [facility], [address], [place], [place] [address]    Culture   Final    NO GROWTH 5 DAYS Performed at [facility], [place] [address], [place], [place] [address]    Report Status 01/18/2023 FINAL  Final  Resp panel by RT-PCR (RSV, Flu A&B, Covid) Anterior Nasal Swab     Status: None   Collection Time: 01/13/23  3:45 AM   Specimen: Anterior Nasal Swab  Result Value Ref Range Status   SARS Coronavirus 2 by RT PCR NEGATIVE NEGATIVE Final    Comment: (NOTE) SARS-CoV-2 target nucleic acids are NOT DETECTED.  The SARS-CoV-2 RNA is generally detectable in upper respiratory specimens during the acute phase of infection. The lowest concentration of SARS-CoV-2 viral copies this assay can detect is 138 copies/mL. A negative result does not preclude SARS-Cov-2 infection and should not be used as the sole basis for treatment or other patient management decisions. A negative result may occur with  improper specimen collection/handling, submission of specimen other than nasopharyngeal swab, presence of viral mutation(s) within the areas targeted by this assay, and inadequate number of viral copies(<138 copies/mL). A negative result must be combined with clinical observations, patient history, and epidemiological information. The expected result is Negative.  Fact Sheet for Patients:  EntrepreneurPulse.com.au  Fact Sheet for Healthcare Providers:  IncredibleEmployment.be  This test is no t yet approved or cleared by the [place] FDA and  has been authorized for detection and/or diagnosis of SARS-CoV-2 by FDA under an Emergency Use Authorization (EUA). This EUA will remain  in effect (meaning this test can be used) for the duration of the COVID-19 declaration under Section 564(b)(1) of the Act, 21 U.S.C.section 360bbb-3(b)(1), unless the authorization is terminated  or revoked sooner.        Influenza A by PCR NEGATIVE NEGATIVE Final   Influenza B by PCR NEGATIVE NEGATIVE Final    Comment: (NOTE) The Xpert Xpress SARS-CoV-2/FLU/RSV plus assay is intended as an aid in the diagnosis of influenza from Nasopharyngeal swab specimens and should not be used as a sole basis for treatment. Nasal washings and aspirates are unacceptable for Xpert Xpress SARS-CoV-2/FLU/RSV testing.  Fact Sheet for Patients: EntrepreneurPulse.com.au  Fact Sheet for Healthcare Providers: IncredibleEmployment.be  This test is not yet approved or cleared by the [place] FDA and has been authorized for detection and/or diagnosis of SARS-CoV-2 by FDA under an Emergency Use Authorization (EUA). This EUA will remain in effect (meaning this test can be used) for the duration of the COVID-19 declaration under Section 564(b)(1) of the Act, 21 U.S.C. section 360bbb-3(b)(1), unless the authorization is terminated or revoked.     Resp Syncytial Virus by PCR NEGATIVE NEGATIVE Final    Comment: (NOTE) Fact Sheet for Patients: EntrepreneurPulse.com.au  Fact Sheet for Healthcare Providers: IncredibleEmployment.be  This test is not yet approved or cleared by the [place] FDA and has been authorized for detection and/or diagnosis of SARS-CoV-2 by FDA under an Emergency Use Authorization (EUA). This EUA will remain in effect (meaning this test can be used) for the duration of the COVID-19 declaration under Section 564(b)(1) of the Act, 21 U.S.C. section 360bbb-3(b)(1), unless the authorization is terminated or revoked.  Performed at [facility], [address] [place], [place] [address]   Blood culture (routine x 2)  Status: None   Collection Time: 01/13/23  9:08 AM   Specimen: BLOOD  Result Value Ref Range Status   Specimen Description   Final    BLOOD BLOOD LEFT HAND Performed at [facility],  [address] [place], [place] [address]    Special Requests   Final    BOTTLES DRAWN AEROBIC AND ANAEROBIC Blood Culture results may not be optimal due to an inadequate volume of blood received in culture bottles Performed at [facility], [address], [place], [place] [address]    Culture   Final    NO GROWTH 5 DAYS Performed at [facility], [place] [address] [place],  [address]    Report Status 01/18/2023 FINAL  Final      Radiology Studies last 3 days: No results found.           LOS: 5 days     Emeterio Reeve, DO [facility] 01/18/2023, 4:23 PM    Dictation software may have been used to generate the above note. Typos may occur and escape review in typed/dictated notes. Please contact Dr Sheppard Coil directly for clarity if needed.  Staff may message me via secure chat in [place]  but this may not receive an immediate response,  please page me for urgent matters!  If 7PM-7AM, please contact night coverage [email or web address]

## 2023-01-18 NOTE — Hospital Course (Addendum)
This 89 yrs old female with PMH significant for dementia, type 2 diabetes, hypertension presented in the ED 01/13/2023 with generalized weakness. History was obtained from son. Patient noted to be bedbound and nonverbal at baseline. Son reported patient was noted to be more dehydrated and weak . She had an episode of vomiting 03/14 and been feeling generally weak afterwards. He denied any fever or chills.  03/15: ED workup reveals temp. 99.6, heart rate 100, respiratory rate 20, blood pressure 160/90. She was hypoxic requiring 2 L of supplemental oxygen. Labs include WBC 10.3, Lactic acid 4.2, Chest x-ray showed hazy opacity in the right lower lung. CT abdomen and pelvis showed multi lobar pneumonia as well as moderate stool burden. Patient is admitted for sepsis likely secondary to decubitus ulcers and multifocal pneumonia. Started ceftriaxone and azithromycin, supplemental O2  03/16: relatively stable on O2, awaiting cultures.  03/17: BCx NGx2d. IPAL note --> DNR.  03/18: SLP recs NPO d/t high aspiration risk. Palliative care saw patient.  03/19: Palliative and SLP d/w family again, goals are set. Family remains in agreement with DNR and wants to treat the treatable with plans to d/c home.  03/20: confirmed w/ son that goal is for patient to go home, we discussed hospice at home as a potential source of support, he is willing to talk to them.  03/21: (+)fever and sinus tachycardia on EKG, w/u found CXR (+)increased L basilar opacity --> CT chest pending, CBC/CMP no concerns, UA neg, BCx collected. Hospice to see patient/family. Patient improved some w/ po intake.  03/22: still febrile tachy. Pending thoracentesis but not enough fluid to draw off. Edema RUE, [place] no DVT. Palliative care spoke w/ family who now does not want hospice. Will revisit as needed.  03/23: HR and temp improved. Remains on IV abx. More alert today.   Consultants:  Palliative Care   Procedures: None       ASSESSMENT &  PLAN:   Principal Problem:   Acute respiratory failure with hypoxia ([facility]) Active Problems:   Sepsis ([place])   Pneumonia   Hyperglycemia due to type 2 diabetes mellitus ([facility])   Benign essential HTN   Dementia ([facility])   Lactic acidosis   Protein-calorie malnutrition, severe  Acute hypoxic respiratory failure: resolved Likely secondary to multifocal pneumonia.  Patient does not use oxygen at baseline. Continue supplemental oxygen as needed    Recurrent sepsis uncertain source vs SIRS, possible worsening pneumonia vs cellulitis  uncertain source, had just finished 5 days ceftriaxone + azithro for CAP but concern for persistent/worse pneumonia and pleural effusion  BCx NGx2d Continue cefepime + vancomycin  Low threshold for ID consult but pt seems to be improving  D/c IV fluid - pt has been eating better   Severe sepsis POA sec. to multifocal pneumonia: Patient presented with tachycardia,  tachypnea,  lactic acidosis, leukocytosis. Chest x-ray showed multifocal pneumonia. COVID, Influenza, RSV negative. Blood cultures No growth to date, urine culture contaminated. Urinary strep and Legionella antigen > Negative Completed antibiotics x 5 days ceftriaxone + azithromycin, but opacity increased on CXR 03/21 --> PNA and pleural effusion w/ loculations on CT chest, pt O2 requirement has resolved  Pleural effusion, loculated Unable to perform thoracentesis - insufficient fluid  SpO2 check w/ VS   Type 2 diabetes: She is found to have blood sugar of 400 on presentation. Continue regular insulin sliding scale. Speech evaluation recommended Dysphagia 1 thin liquids.   Dementia, Advanced: Patient does have end-stage dementia with minimal function at  home as well as decreased p.o. intake.  Continue Aricept, Remeron and trazodone.   SLP following Palliative care following intermittently  Family wants to treat the treatable and was open to speaking w/ hospice but later declined hospice  services - goal is to take patient home    Hypertension: Continue hydralazine as needed for hypertension.   Decubitus Bed ulcers: Continue wound care. Change body posture q4hrs  Lactic acidosis: resolved Likely in the setting of  multifocal pneumonia.   DVT prophylaxis: SCD Pertinent IV fluids/nutrition: dysphagia diet but po intake has been minimal so remains on IV fluids  Central lines / invasive devices: none  Code Status: DNR  Current Admission Status: inpatient  TOC needs / Dispo plan: home health / possible hospice - fmaily want to take her home when able  Barriers to discharge / significant pending items: remains on IV abx pending clinical improvement but seems better today, anticipate may be appropriate for discharge tomorrow if remains normal HR and no fever

## 2023-01-19 ENCOUNTER — Inpatient Hospital Stay: Payer: Medicare HMO

## 2023-01-19 DIAGNOSIS — E43 Unspecified severe protein-calorie malnutrition: Secondary | ICD-10-CM | POA: Diagnosis not present

## 2023-01-19 DIAGNOSIS — J9601 Acute respiratory failure with hypoxia: Secondary | ICD-10-CM | POA: Diagnosis not present

## 2023-01-19 DIAGNOSIS — G309 Alzheimer's disease, unspecified: Secondary | ICD-10-CM | POA: Diagnosis not present

## 2023-01-19 DIAGNOSIS — J189 Pneumonia, unspecified organism: Secondary | ICD-10-CM | POA: Diagnosis not present

## 2023-01-19 LAB — COMPREHENSIVE METABOLIC PANEL
ALT: 19 U/L (ref 0–44)
AST: 31 U/L (ref 15–41)
Albumin: 2.4 g/dL — ABNORMAL LOW (ref 3.5–5.0)
Alkaline Phosphatase: 53 U/L (ref 38–126)
Anion gap: 7 (ref 5–15)
BUN: 14 mg/dL (ref 8–23)
CO2: 27 mmol/L (ref 22–32)
Calcium: 8.5 mg/dL — ABNORMAL LOW (ref 8.9–10.3)
Chloride: 100 mmol/L (ref 98–111)
Creatinine, Ser: 0.38 mg/dL — ABNORMAL LOW (ref 0.44–1.00)
GFR, Estimated: >60 mL/min (ref >60)
Glucose, Bld: 237 mg/dL — ABNORMAL HIGH (ref 70–99)
Potassium: 4 mmol/L (ref 3.5–5.1)
Sodium: 134 mmol/L — ABNORMAL LOW (ref 135–145)
Total Bilirubin: 0.5 mg/dL (ref 0.3–1.2)
Total Protein: 6.4 g/dL — ABNORMAL LOW (ref 6.5–8.1)

## 2023-01-19 LAB — CBC
HCT: 29.9 % — ABNORMAL LOW (ref 36.0–46.0)
Hemoglobin: 9.8 g/dL — ABNORMAL LOW (ref 12.0–15.0)
MCH: 28.4 pg (ref 26.0–34.0)
MCHC: 32.8 g/dL (ref 30.0–36.0)
MCV: 86.7 fL (ref 80.0–100.0)
Platelets: 327 10*3/uL (ref 150–400)
RBC: 3.45 MIL/uL — ABNORMAL LOW (ref 3.87–5.11)
RDW: 14.2 % (ref 11.5–15.5)
WBC: 6.7 10*3/uL (ref 4.0–10.5)
nRBC: 0 % (ref 0.0–0.2)

## 2023-01-19 LAB — URINALYSIS, COMPLETE (UACMP) WITH MICROSCOPIC
Bilirubin Urine: NEGATIVE
Glucose, UA: >1000 mg/dL — AB
Hgb urine dipstick: NEGATIVE
Ketones, ur: NEGATIVE mg/dL
Leukocytes,Ua: NEGATIVE
Nitrite: NEGATIVE
Protein, ur: NEGATIVE mg/dL
Specific Gravity, Urine: 1.02 (ref 1.005–1.030)
Squamous Epithelial / HPF: NONE SEEN /HPF (ref 0–5)
pH: 6.5 (ref 5.0–8.0)

## 2023-01-19 LAB — MRSA NEXT GEN BY PCR, NASAL: MRSA by PCR Next Gen: NOT DETECTED

## 2023-01-19 LAB — GLUCOSE, CAPILLARY
Glucose-Capillary: 149 mg/dL — ABNORMAL HIGH (ref 70–99)
Glucose-Capillary: 166 mg/dL — ABNORMAL HIGH (ref 70–99)
Glucose-Capillary: 197 mg/dL — ABNORMAL HIGH (ref 70–99)
Glucose-Capillary: 205 mg/dL — ABNORMAL HIGH (ref 70–99)
Glucose-Capillary: 263 mg/dL — ABNORMAL HIGH (ref 70–99)
Glucose-Capillary: 271 mg/dL — ABNORMAL HIGH (ref 70–99)

## 2023-01-19 LAB — LACTIC ACID, PLASMA: Lactic Acid, Venous: 1.7 mmol/L (ref 0.5–1.9)

## 2023-01-19 MED ORDER — VANCOMYCIN HCL IN DEXTROSE 1-5 GM/200ML-% IV SOLN
1000.0000 mg | Freq: Once | INTRAVENOUS | Status: AC
  Administered 2023-01-19: 1000 mg via INTRAVENOUS
  Filled 2023-01-19: qty 200

## 2023-01-19 MED ORDER — SODIUM CHLORIDE 0.9 % IV SOLN
2.0000 g | Freq: Two times a day (BID) | INTRAVENOUS | Status: DC
  Administered 2023-01-19 – 2023-01-22 (×7): 2 g via INTRAVENOUS
  Filled 2023-01-19 (×2): qty 12.5
  Filled 2023-01-19 (×3): qty 2
  Filled 2023-01-19: qty 12.5
  Filled 2023-01-19: qty 2
  Filled 2023-01-19: qty 12.5

## 2023-01-19 MED ORDER — SODIUM CHLORIDE 0.9 % IV BOLUS
500.0000 mL | Freq: Once | INTRAVENOUS | Status: AC
  Administered 2023-01-19: 500 mL via INTRAVENOUS

## 2023-01-19 NOTE — Consult Note (Signed)
Pharmacy Antibiotic Note  Melanie Turner is a 89 y.o. female admitted on 01/13/2023 with  generalized weakness .  Patient was treated for pneumonia for 6 days with no improvement of symptoms; still febrile and now tachycardic on EKG. New CXR showing increased basilar opacity. Blood cultures recollected. Pharmacy has been consulted for Vancomycin and Cefepime dosing.  Plan: Vancomycin 1g IV x 1 as loading dose, followed by 500 mg IV Q 24 hrs. Goal AUC 400-550. Expected AUC: 530.8 Cmin: 15.1 SCr used: 0.8(actual 0.31), Vd used: 0.72 TBW<IBW  Cefepime 2g IV Q12 hours   Height: 5\' 2"  (157.5 cm) Weight: 42 kg (92 lb 9.5 oz) IBW/kg (Calculated) : 50.1  Temp (24hrs), Avg:98.9 F (37.2 C), Min:98.1 F (36.7 C), Max:100.5 F (38.1 C)  Recent Labs  Lab 01/13/23 0523 01/13/23 0700 01/13/23 0908 01/14/23 0530 01/15/23 0429 01/15/23 1052 01/17/23 0626 01/18/23 0555 01/19/23 1117  WBC  --   --  12.6* 9.1 8.7  --  5.4 5.0 6.7  CREATININE  --   --  0.47 0.34* 0.39*  --  0.36* <0.30* 0.38*  LATICACIDVEN 4.2* 3.9* 2.6*  --   --  0.9  --   --  1.7    Estimated Creatinine Clearance: 32.2 mL/min (A) (by C-G formula based on SCr of 0.38 mg/dL (L)).    No Known Allergies  Antimicrobials this admission: Azithro/Rocephin 3/15 >> 3/20 Vanc/Cefepime 3/21 >>   Dose adjustments this admission: N/A  Microbiology results: 3/21 BCx: collected 3/21 UCx: pending  3/21 MRSA PCR: pending 3/15 Bcx: NGF  Thank you for allowing pharmacy to be a part of this patient's care.  Lance Coon A Kahealani Yankovich 01/19/2023 1:31 PM

## 2023-01-19 NOTE — Care Management Important Message (Signed)
Important Message  Patient Details  Name: Melanie Turner MRN: [id number] Date of Birth: 28-Dec-1931   Medicare Important Message Given:  Yes     Dannette Barbara 01/19/2023, 12:15 PM

## 2023-01-19 NOTE — Progress Notes (Signed)
PT Cancellation Note  Patient Details Name: Melanie Turner MRN: [id number] DOB: Jan 21, 1932   Cancelled Treatment:    Reason Eval/Treat Not Completed: PT screened, no needs identified, will sign off.  PT consult received.  Chart reviewed.  Pt's son and daughter present in room (pt's son was assisting pt with eating and reports they normally need to assist with this).  Pt's son reports that pt has been bed-bound for past year (gets up to [facility] at times but requires significant assist--pt does not have good sitting balance on edge of bed but is able to sit safely on [facility] with support); pt dependent for ADL's.  Pt did not verbalize anything while therapist was in pt's room.  Per discussion with pt's son and daughter, they do not want PT or OT at this time and have no concerns with pt's care at home (OT notified)--plan to discharge home with 24/7 assist.  PT will sign off (TOC notified).  Leitha Bleak, PT 01/19/23, 10:20 AM

## 2023-01-19 NOTE — Progress Notes (Signed)
OT Cancellation Note  Patient Details Name: Brianica Asel MRN: [id number] DOB: [birth year]   Cancelled Treatment:    Reason Eval/Treat Not Completed: OT screened, no needs identified, will sign off. Order received, chart reviewed. Per conversation with PT, pt dependent for ADLs and mobility at baseline. No skilled acute OT needs. Will sign off.   Dessie Coma, M.S. OTR/L  01/19/23, 10:15 AM  ascom [phone]

## 2023-01-19 NOTE — Progress Notes (Signed)
PROGRESS NOTE    Melanie Turner   [id number] DOB: 1931/11/03  DOA: 01/13/2023 Date of Service: 01/19/23 PCP: Kirk Ruths, MD     Brief Narrative / Hospital Course:  This 89 yrs old female with PMH significant for dementia, type 2 diabetes, hypertension presented in the ED 01/13/2023 with generalized weakness. History was obtained from son. Patient noted to be bedbound and nonverbal at baseline. Son reported patient was noted to be more dehydrated and weak . She had an episode of vomiting 03/14 and been feeling generally weak afterwards. He denied any fever or chills.  03/15: ED workup reveals temp. 99.6, heart rate 100, respiratory rate 20, blood pressure 160/90. She was hypoxic requiring 2 L of supplemental oxygen. Labs include WBC 10.3, Lactic acid 4.2, Chest x-ray showed hazy opacity in the right lower lung. CT abdomen and pelvis showed multi lobar pneumonia as well as moderate stool burden. Patient is admitted for sepsis likely secondary to decubitus ulcers and multifocal pneumonia. Started ceftriaxone and azithromycin, supplemental O2  03/16: relatively stable on O2, awaiting cultures.  03/17: BCx NGx2d. IPAL note --> DNR.  03/18: SLP recs NPO d/t high aspiration risk. Palliative care saw patient.  03/19: Palliative and SLP d/w family again, goals are set. Family remains in agreement with DNR and wants to treat the treatable with plans to d/c home.  03/20: confirmed w/ son that goal is for patient to go home, we discussed hospice at home as a potential source of support, he is willing to talk to them.  03/21: (+)fever and sinus tachycardia on EKG, w/u found CXR (+)increased L basilar opacity --> CT chest pending, CBC/CMP no concerns, UA pending, BCx collected. Hospice to see patient/family. Patient improved some w/ po intake.   Consultants:  Palliative Care   Procedures: None       ASSESSMENT & PLAN:   Principal Problem:   Acute respiratory failure with hypoxia  (HCC) Active Problems:   Sepsis ([place])   Pneumonia   Hyperglycemia due to type 2 diabetes mellitus (HCC)   Benign essential HTN   Dementia (HCC)   Lactic acidosis   Protein-calorie malnutrition, severe  Acute hypoxic respiratory failure: resolved Likely secondary to multifocal pneumonia.  Patient does not use oxygen at baseline. Continue supplemental oxygen and wean as tolerated.   Severe sepsis sec. to multifocal pneumonia: Patient presented with tachycardia,  tachypnea,  lactic acidosis, leukocytosis. Chest x-ray showed multifocal pneumonia. COVID, Influenza, RSV negative. Blood cultures No growth to date, urine culture contaminated. Urinary strep and Legionella antigen > Negative Completed antibiotics x 5 days ceftriaxone + azithromycin, but opacity increased on CXR today --> will obtain CT chest, pt O2 requirement has resolved, await CT and UA   Recurrent sepsis uncertain source, possible worsening pneumonia  Starting cefepime + vancomycin  uncertain source, just finished 5 days ceftriaxone + azithro for CAP but CXR worse, also decub ulcers and UA is pending  Low threshold for ID consult BCx pending  Continue IV fluid - pt has not been eating well  Lactic acid no concerns    Type 2 diabetes: She is found to have blood sugar of 400 on presentation. Continue regular insulin sliding scale. Speech evaluation recommended Dysphagia 1 thin liquids.   Lactic acidosis: resolved Likely in the setting of  multifocal pneumonia.   Dementia, Advanced: Patient does have end-stage dementia with minimal function at home as well as decreased p.o. intake.  Continue Aricept, Remeron and trazodone.   Speech and swallow recommended  n.p.o. due to high risk for aspiration. Palliative care consulted to discuss goals of care. Family wants to treat the treatable but is open to speaking w/ hospice - goal is to take patient home    Hypertension: Continue hydralazine as needed for hypertension.    Decubitus Bed ulcers: Continue wound care. Change body posture q4hrs     DVT prophylaxis: SCD Pertinent IV fluids/nutrition: dysphagia diet but po intake has been minimal  Central lines / invasive devices: none  Code Status: DNR  Current Admission Status: inpatient  TOC needs / Dispo plan: home health / possible hospice Barriers to discharge / significant pending items: hospice eval              Subjective / Brief ROS:  Patient not contributing to interview - she is more alert today though.    Family Communication: discussed w/ family at bedside on rounds    Objective Findings:  Vitals:   01/19/23 0434 01/19/23 0843 01/19/23 0857 01/19/23 1043  BP: (!) 167/96 126/77 (!) 157/82 (!) 162/80  Pulse: (!) 109 (!) 115 (!) 122 (!) 120  Resp: 18 20 18 20   Temp: 98.6 F (37 C) (!) 100.5 F (38.1 C) 99.3 F (37.4 C) 98.6 F (37 C)  TempSrc: Oral Axillary Oral Axillary  SpO2: 100% 98% 98% 99%  Weight:      Height:        Intake/Output Summary (Last 24 hours) at 01/19/2023 1256 Last data filed at 01/19/2023 1145 Gross per 24 hour  Intake 1530 ml  Output --  Net 1530 ml   Filed Weights   01/13/23 0255  Weight: 42 kg    Examination:  Physical Exam Constitutional:      General: She is not in acute distress.    Appearance: She is ill-appearing. She is not toxic-appearing.  HENT:     Mouth/Throat:     Mouth: Mucous membranes are dry.  Cardiovascular:     Rate and Rhythm: Regular rhythm. Tachycardia present.  Pulmonary:     Breath sounds: Normal breath sounds.  Abdominal:     Palpations: Abdomen is soft.  Neurological:     Mental Status: She is alert. Mental status is at baseline.       EKF sinus tachycardia    Scheduled Medications:   vitamin C  500 mg Oral BID   enoxaparin (LOVENOX) injection  30 mg Subcutaneous Q24H   feeding supplement  237 mL Oral TID BM   hydrALAZINE  25 mg Oral Q8H   insulin aspart  0-9 Units Subcutaneous Q4H    multivitamin with minerals  1 tablet Oral Daily   zinc sulfate  220 mg Oral Daily    Continuous Infusions:  sodium chloride Stopped (01/15/23 1827)   0.9 % NaCl with KCl 40 mEq / L 100 mL/hr at 01/19/23 1049    PRN Medications:  sodium chloride, hydrALAZINE, ondansetron **OR** ondansetron (ZOFRAN) IV  Antimicrobials from admission:  Anti-infectives (From admission, onward)    Start     Dose/Rate Route Frequency Ordered Stop   01/14/23 0600  cefTRIAXone (ROCEPHIN) 2 g in sodium chloride 0.9 % 100 mL IVPB        2 g 200 mL/hr over 30 Minutes Intravenous Every 24 hours 01/13/23 0823 01/18/23 0631   01/14/23 0600  azithromycin (ZITHROMAX) 500 mg in sodium chloride 0.9 % 250 mL IVPB        500 mg 250 mL/hr over 60 Minutes Intravenous Every 24 hours 01/13/23 0823 01/18/23  1017   01/13/23 0400  cefTRIAXone (ROCEPHIN) 2 g in sodium chloride 0.9 % 100 mL IVPB        2 g 200 mL/hr over 30 Minutes Intravenous  Once 01/13/23 0350 01/13/23 0510   01/13/23 0400  azithromycin (ZITHROMAX) 500 mg in sodium chloride 0.9 % 250 mL IVPB        500 mg 250 mL/hr over 60 Minutes Intravenous  Once 01/13/23 0350 01/13/23 [id number]           Data Reviewed:  I have personally reviewed the following...  CBC: Recent Labs  Lab 01/13/23 0344 01/13/23 0908 01/14/23 0530 01/15/23 0429 01/17/23 0626 01/18/23 0555 01/19/23 1117  WBC 10.3   < > 9.1 8.7 5.4 5.0 6.7  NEUTROABS 9.3*  --   --   --   --   --   --   HGB 12.7   < > 10.4* 10.6* 10.7* 9.9* 9.8*  HCT 38.4   < > 32.0* 32.6* 31.9* 30.1* 29.9*  MCV 85.3   < > 87.7 86.7 83.9 84.8 86.7  PLT 411*   < > 221 259 286 286 327   < > = values in this interval not displayed.   Basic Metabolic Panel: Recent Labs  Lab 01/14/23 0530 01/15/23 0429 01/17/23 0626 01/18/23 0555 01/19/23 1117  NA 134* 135 136 135 134*  K 3.0* 3.1* 2.8* 3.8 4.0  CL 101 96* 100 104 100  CO2 24 26 27 28 27   GLUCOSE 106* 86 169* 102* 237*  BUN 14 13 7* 10 14  CREATININE  0.34* 0.39* 0.36* <0.30* 0.38*  CALCIUM 8.6* 8.9 8.5* 8.4* 8.5*  MG  --  1.5* 1.4* 1.8  --   PHOS  --  3.0 2.2* 2.7  --    GFR: Estimated Creatinine Clearance: 32.2 mL/min (A) (by C-G formula based on SCr of 0.38 mg/dL (L)). Liver Function Tests: Recent Labs  Lab 01/13/23 0344 01/14/23 0530 01/15/23 0429 01/19/23 1117  AST 34 22 22 31   ALT 18 12 14 19   ALKPHOS 74 58 57 53  BILITOT 1.1 0.7 1.0 0.5  PROT 8.3* 6.3* 6.8 6.4*  ALBUMIN 3.2* 2.4* 2.4* 2.4*   Recent Labs  Lab 01/13/23 0342  LIPASE 44   No results for input(s): "AMMONIA" in the last 168 hours. Coagulation Profile: No results for input(s): "INR", "PROTIME" in the last 168 hours. Cardiac Enzymes: No results for input(s): "CKTOTAL", "CKMB", "CKMBINDEX", "TROPONINI" in the last 168 hours. BNP (last 3 results) No results for input(s): "PROBNP" in the last 8760 hours. HbA1C: No results for input(s): "HGBA1C" in the last 72 hours. CBG: Recent Labs  Lab 01/18/23 2035 01/19/23 0007 01/19/23 0431 01/19/23 0845 01/19/23 1205  GLUCAP 164* 197* 149* 166* 263*   Lipid Profile: No results for input(s): "CHOL", "HDL", "LDLCALC", "TRIG", "CHOLHDL", "LDLDIRECT" in the last 72 hours. Thyroid Function Tests: No results for input(s): "TSH", "T4TOTAL", "FREET4", "T3FREE", "THYROIDAB" in the last 72 hours. Anemia Panel: No results for input(s): "VITAMINB12", "FOLATE", "FERRITIN", "TIBC", "IRON", "RETICCTPCT" in the last 72 hours. Most Recent Urinalysis On File:     Component Value Date/Time   COLORURINE YELLOW (A) 01/13/2023 0405   APPEARANCEUR HAZY (A) 01/13/2023 0405   APPEARANCEUR Clear 06/30/2014 1501   LABSPEC 1.028 01/13/2023 0405   LABSPEC 1.011 06/30/2014 1501   PHURINE 6.0 01/13/2023 0405   GLUCOSEU >=500 (A) 01/13/2023 0405   GLUCOSEU Negative 06/30/2014 1501   HGBUR NEGATIVE 01/13/2023 0405   BILIRUBINUR NEGATIVE  01/13/2023 0405   BILIRUBINUR Negative 06/30/2014 1501   KETONESUR 5 (A) 01/13/2023 0405    PROTEINUR 30 (A) 01/13/2023 0405   NITRITE NEGATIVE 01/13/2023 0405   LEUKOCYTESUR NEGATIVE 01/13/2023 0405   LEUKOCYTESUR Trace 06/30/2014 1501   Sepsis Labs: @LABRCNTIP (procalcitonin:4,lacticidven:4) Microbiology: Recent Results (from the past 240 hour(s))  Blood culture (routine x 2)     Status: None   Collection Time: 01/13/23  3:42 AM   Specimen: BLOOD  Result Value Ref Range Status   Specimen Description   Final    BLOOD BLOOD RIGHT ARM Performed at [facility], [address], [place], [place] [address]    Special Requests   Final    BOTTLES DRAWN AEROBIC AND ANAEROBIC Blood Culture adequate volume Performed at [facility], [address] [place], [place] [address]    Culture   Final    NO GROWTH 5 DAYS Performed at [facility], [place] [address], [place], [place] [address]    Report Status 01/18/2023 FINAL  Final  Resp panel by RT-PCR (RSV, Flu A&B, Covid) Anterior Nasal Swab     Status: None   Collection Time: 01/13/23  3:45 AM   Specimen: Anterior Nasal Swab  Result Value Ref Range Status   SARS Coronavirus 2 by RT PCR NEGATIVE NEGATIVE Final    Comment: (NOTE) SARS-CoV-2 target nucleic acids are NOT DETECTED.  The SARS-CoV-2 RNA is generally detectable in upper respiratory specimens during the acute phase of infection. The lowest concentration of SARS-CoV-2 viral copies this assay can detect is 138 copies/mL. A negative result does not preclude SARS-Cov-2 infection and should not be used as the sole basis for treatment or other patient management decisions. A negative result may occur with  improper specimen collection/handling, submission of specimen other than nasopharyngeal swab, presence of viral mutation(s) within the areas targeted by this assay, and inadequate number of viral copies(<138 copies/mL). A negative result must be combined with clinical observations, patient history, and epidemiological information. The expected  result is Negative.  Fact Sheet for Patients:  EntrepreneurPulse.com.au  Fact Sheet for Healthcare Providers:  IncredibleEmployment.be  This test is no t yet approved or cleared by the [place] FDA and  has been authorized for detection and/or diagnosis of SARS-CoV-2 by FDA under an Emergency Use Authorization (EUA). This EUA will remain  in effect (meaning this test can be used) for the duration of the COVID-19 declaration under Section 564(b)(1) of the Act, 21 U.S.C.section 360bbb-3(b)(1), unless the authorization is terminated  or revoked sooner.       Influenza A by PCR NEGATIVE NEGATIVE Final   Influenza B by PCR NEGATIVE NEGATIVE Final    Comment: (NOTE) The Xpert Xpress SARS-CoV-2/FLU/RSV plus assay is intended as an aid in the diagnosis of influenza from Nasopharyngeal swab specimens and should not be used as a sole basis for treatment. Nasal washings and aspirates are unacceptable for Xpert Xpress SARS-CoV-2/FLU/RSV testing.  Fact Sheet for Patients: EntrepreneurPulse.com.au  Fact Sheet for Healthcare Providers: IncredibleEmployment.be  This test is not yet approved or cleared by the [place] FDA and has been authorized for detection and/or diagnosis of SARS-CoV-2 by FDA under an Emergency Use Authorization (EUA). This EUA will remain in effect (meaning this test can be used) for the duration of the COVID-19 declaration under Section 564(b)(1) of the Act, 21 U.S.C. section 360bbb-3(b)(1), unless the authorization is terminated or revoked.     Resp Syncytial Virus by PCR NEGATIVE NEGATIVE Final  Comment: (NOTE) Fact Sheet for Patients: EntrepreneurPulse.com.au  Fact Sheet for Healthcare Providers: IncredibleEmployment.be  This test is not yet approved or cleared by the [place] FDA and has been authorized for detection and/or diagnosis of  SARS-CoV-2 by FDA under an Emergency Use Authorization (EUA). This EUA will remain in effect (meaning this test can be used) for the duration of the COVID-19 declaration under Section 564(b)(1) of the Act, 21 U.S.C. section 360bbb-3(b)(1), unless the authorization is terminated or revoked.  Performed at [facility], [address] [place], [place] [address]   Blood culture (routine x 2)     Status: None   Collection Time: 01/13/23  9:08 AM   Specimen: BLOOD  Result Value Ref Range Status   Specimen Description   Final    BLOOD BLOOD LEFT HAND Performed at [facility], [address] [place], [place] [address]    Special Requests   Final    BOTTLES DRAWN AEROBIC AND ANAEROBIC Blood Culture results may not be optimal due to an inadequate volume of blood received in culture bottles Performed at [facility], [address] [place], [place] [address]    Culture   Final    NO GROWTH 5 DAYS Performed at [facility], [place] [address] [place], [place] [address]    Report Status 01/18/2023 FINAL  Final      Radiology Studies last 3 days: DG Chest Port 1 View  Result Date: 01/19/2023 CLINICAL DATA:  Fever. EXAM: PORTABLE CHEST 1 VIEW COMPARISON:  January 13, 2023. FINDINGS: The heart size and mediastinal contours are within normal limits. Increased left basilar opacity is noted concerning for worsening pneumonia or atelectasis with associated pleural effusion. Mild right basilar atelectasis or infiltrate is noted. The visualized skeletal structures are unremarkable. IMPRESSION: Increased left basilar opacity as described above. Mild right basilar atelectasis or infiltrate is again noted. Electronically Signed   By: Marijo Conception M.D.   On: 01/19/2023 11:01             LOS: 6 days     Emeterio Reeve, DO [facility] 01/19/2023, 12:56 PM    Dictation software may have been used to generate the above note. Typos may occur and  escape review in typed/dictated notes. Please contact Dr Sheppard Coil directly for clarity if needed.  Staff may message me via secure chat in [place]  but this may not receive an immediate response,  please page me for urgent matters!  If 7PM-7AM, please contact night coverage [email or web address]

## 2023-01-19 NOTE — Progress Notes (Signed)
MEWS Progress Note  Patient Details Name: Melanie Turner MRN: [id number] DOB: June 02, 1932 Today's Date: 01/19/2023   MEWS Flowsheet Documentation:  Assess: MEWS Score Temp: 99.3 F (37.4 C) BP: (!) 157/82 MAP (mmHg): 102 Pulse Rate: (!) 122 ECG Heart Rate: (!) 112 Resp: 18 Level of Consciousness: Alert SpO2: 98 % O2 Device: Room Air Patient Activity (if Appropriate): In bed O2 Flow Rate (L/min): 2 L/min Assess: MEWS Score MEWS Temp: 0 MEWS Systolic: 0 MEWS Pulse: 2 MEWS RR: 0 MEWS LOC: 0 MEWS Score: 2 MEWS Score Color: Yellow Assess: SIRS CRITERIA SIRS Temperature : 0 SIRS Respirations : 0 SIRS Pulse: 1 SIRS WBC: 0 SIRS Score Sum : 1 SIRS Temperature : 0 SIRS Pulse: 1 SIRS Respirations : 0 SIRS WBC: 0 SIRS Score Sum : 1 Assess: if the MEWS score is Yellow or Red Were vital signs taken at a resting state?: Yes Focused Assessment: No change from prior assessment Does the patient meet 2 or more of the SIRS criteria?: No MEWS guidelines implemented : Yes, yellow Treat MEWS Interventions: Considered administering scheduled or prn medications/treatments as ordered Take Vital Signs Increase Vital Sign Frequency : Yellow: Q2hr x1, continue Q4hrs until patient remains green for 12hrs Escalate MEWS: Escalate: Yellow: Discuss with charge nurse and consider notifying provider and/or RRT Notify: Charge Nurse/RN Name of Charge Nurse/RN Notified: Producer, television/film/video Provider Notification Provider Name/Title: Dr. Sheppard Coil Date Provider Notified: 01/19/23 Time Provider Notified: [phone] Method of Notification: Call Notification Reason: Other (Comment) (yellow MEWS) Provider response: In department Date of Provider Response: 01/19/23 Time of Provider Response: 0900 Notify: Rapid Response Name of Rapid Response RN Notified: Not applicable  No distress, no complaints offered at this time.  Children at bedside.    Melanie Turner 01/19/2023, 9:00 AM

## 2023-01-19 NOTE — Progress Notes (Signed)
                                                     Palliative Care Progress Note, Assessment & Plan   [name]       Date: 01/19/2023 DOB: 1931/11/16  Age: 89 y.o. MRN#: [id number] Attending Physician: Emeterio Reeve, DO Primary Care Physician: Kirk Ruths, MD Admit Date: 01/13/2023  Subjective: Patient is sitting up in bed and makes eye contact with me.  She is nonverbal during our discussion.  Patient appears to be in no apparent distress.  Her daughter Leotis Shames is at bedside.  HPI: 89 y.o. female  with past medical history of dementia, type 2 diabetes, right hip arthroplasty (2021), thyroidectomy, and HTN admitted on 01/13/2023 with weakness, 1 episode of vomiting, and dehydration.   COVID, flu, and RSV negative.  CT of abdomen revealed right-sided multilobar pneumonia as well as a moderate stool burden.  Patient is being treated with IV Rocephin and azithromycin and IVF.    PMT was consulted to discuss goals of care.   Summary of counseling/coordination of care: After reviewing the patient's chart, I happened to walk by patient's daughter in the hallway.  She recognized me and we discussed patient's current health status and plan of care for patient.  After assessing the patient at bedside, I spoke with Donza in regards to plan and goals of care.  Leotis Shames shares that the plan remains for patient to go home. I highlighted that there have been discussions of enacting hospice services. Hospice philosophy and services discussed.   Leotis Shames shares that she and her brother Myna Bright have thought about it but that they do not want to start hospice services at this time.    The difference between palliative medicine and hospice discussed.  Leotis Shames shares that they are not ready to accept outpatient palliative services at this  time.  Symptom burden remains low.  No adjustments to medications needed at this time.  Goals are clear.  DNR remains.  Treat the treatable and return to hospital if indicated/appropriate. Plan is for home.  PMT will remain available to patient and family and monitor her peripherally.  Please re-engage with PMT if goals change, at patient/family's request, or if patient's health deteriorates during hospitalization.  Physical Exam Vitals reviewed.  Constitutional:      General: She is not in acute distress.    Appearance: She is not ill-appearing.     Comments: Thin, frail  HENT:     Mouth/Throat:     Mouth: Mucous membranes are moist.  Eyes:     Pupils: Pupils are equal, round, and reactive to light.  Cardiovascular:     Rate and Rhythm: Normal rate.  Pulmonary:     Effort: Pulmonary effort is normal.  Abdominal:     Palpations: Abdomen is soft.  Musculoskeletal:     Comments: Bedbound, cannot MAETC, generalized weakness  Neurological:     Mental Status: She is alert.     Comments: nonverbal             Total Time 25 minutes   Petrona Wyeth L. Ilsa Iha, FNP-BC Palliative Medicine Team Team Phone # [phone]

## 2023-01-20 ENCOUNTER — Inpatient Hospital Stay: Payer: Medicare HMO

## 2023-01-20 DIAGNOSIS — J9601 Acute respiratory failure with hypoxia: Secondary | ICD-10-CM | POA: Diagnosis not present

## 2023-01-20 LAB — GLUCOSE, CAPILLARY
Glucose-Capillary: 109 mg/dL — ABNORMAL HIGH (ref 70–99)
Glucose-Capillary: 125 mg/dL — ABNORMAL HIGH (ref 70–99)
Glucose-Capillary: 145 mg/dL — ABNORMAL HIGH (ref 70–99)
Glucose-Capillary: 179 mg/dL — ABNORMAL HIGH (ref 70–99)
Glucose-Capillary: 182 mg/dL — ABNORMAL HIGH (ref 70–99)
Glucose-Capillary: 234 mg/dL — ABNORMAL HIGH (ref 70–99)

## 2023-01-20 LAB — URINE CULTURE: Culture: NO GROWTH

## 2023-01-20 LAB — CREATININE, SERUM
Creatinine, Ser: 0.36 mg/dL — ABNORMAL LOW (ref 0.44–1.00)
GFR, Estimated: >60 mL/min (ref >60)

## 2023-01-20 MED ORDER — VANCOMYCIN HCL 500 MG/100ML IV SOLN
500.0000 mg | INTRAVENOUS | Status: DC
  Administered 2023-01-20 – 2023-01-22 (×3): 500 mg via INTRAVENOUS
  Filled 2023-01-20 (×3): qty 100

## 2023-01-20 NOTE — Progress Notes (Signed)
PROGRESS NOTE    Melanie Turner   [id number] DOB: 06/14/1932  DOA: 01/13/2023 Date of Service: 01/20/23 PCP: Kirk Ruths, MD     Brief Narrative / Hospital Course:  This 89 yrs old female with PMH significant for dementia, type 2 diabetes, hypertension presented in the ED 01/13/2023 with generalized weakness. History was obtained from son. Patient noted to be bedbound and nonverbal at baseline. Son reported patient was noted to be more dehydrated and weak . She had an episode of vomiting 03/14 and been feeling generally weak afterwards. He denied any fever or chills.  03/15: ED workup reveals temp. 99.6, heart rate 100, respiratory rate 20, blood pressure 160/90. She was hypoxic requiring 2 L of supplemental oxygen. Labs include WBC 10.3, Lactic acid 4.2, Chest x-ray showed hazy opacity in the right lower lung. CT abdomen and pelvis showed multi lobar pneumonia as well as moderate stool burden. Patient is admitted for sepsis likely secondary to decubitus ulcers and multifocal pneumonia. Started ceftriaxone and azithromycin, supplemental O2  03/16: relatively stable on O2, awaiting cultures.  03/17: BCx NGx2d. IPAL note --> DNR.  03/18: SLP recs NPO d/t high aspiration risk. Palliative care saw patient.  03/19: Palliative and SLP d/w family again, goals are set. Family remains in agreement with DNR and wants to treat the treatable with plans to d/c home.  03/20: confirmed w/ son that goal is for patient to go home, we discussed hospice at home as a potential source of support, he is willing to talk to them.  03/21: (+)fever and sinus tachycardia on EKG, w/u found CXR (+)increased L basilar opacity --> CT chest pending, CBC/CMP no concerns, UA neg, BCx collected. Hospice to see patient/family. Patient improved some w/ po intake.  03/22: still febrile tachy. Pending thoracentesis and BCx.   Consultants:  Palliative Care   Procedures: None       ASSESSMENT & PLAN:    Principal Problem:   Acute respiratory failure with hypoxia (HCC) Active Problems:   Sepsis ([place])   Pneumonia   Hyperglycemia due to type 2 diabetes mellitus (HCC)   Benign essential HTN   Dementia (HCC)   Lactic acidosis   Protein-calorie malnutrition, severe  Acute hypoxic respiratory failure: resolved Likely secondary to multifocal pneumonia.  Patient does not use oxygen at baseline. Continue supplemental oxygen and wean as tolerated.   Severe sepsis sec. to multifocal pneumonia: Patient presented with tachycardia,  tachypnea,  lactic acidosis, leukocytosis. Chest x-ray showed multifocal pneumonia. COVID, Influenza, RSV negative. Blood cultures No growth to date, urine culture contaminated. Urinary strep and Legionella antigen > Negative Completed antibiotics x 5 days ceftriaxone + azithromycin, but opacity increased on CXR 03/21 --> PNA and pleural effusion w/ loculations on CT chest, pt O2 requirement has resolved  Pleural effusion, loculated Pending thoracentesis and labs   Recurrent sepsis uncertain source, possible worsening pneumonia  Continue cefepime + vancomycin  uncertain source, just finished 5 days ceftriaxone + azithro for CAP but concern for persistent/worse pneumonia and pleural effusion  Low threshold for ID consult BCx pending  Continue IV fluid - pt has not been eating well    Type 2 diabetes: She is found to have blood sugar of 400 on presentation. Continue regular insulin sliding scale. Speech evaluation recommended Dysphagia 1 thin liquids.   Lactic acidosis: resolved Likely in the setting of  multifocal pneumonia.   Dementia, Advanced: Patient does have end-stage dementia with minimal function at home as well as decreased p.o. intake.  Continue Aricept, Remeron and trazodone.   SLP following Palliative care following intermittently  Family wants to treat the treatable but is open to speaking w/ hospice - goal is to take patient home     Hypertension: Continue hydralazine as needed for hypertension.   Decubitus Bed ulcers: Continue wound care. Change body posture q4hrs     DVT prophylaxis: SCD Pertinent IV fluids/nutrition: dysphagia diet but po intake has been minimal so remains on IV fluids  Central lines / invasive devices: none  Code Status: DNR  Current Admission Status: inpatient  TOC needs / Dispo plan: home health / possible hospice Barriers to discharge / significant pending items: hospice eval, new sepsis, pending thoracentesis               Subjective / Brief ROS:  Patient not contributing to interview - she is more alert today though.    Family Communication: discussed w/ family at bedside on rounds    Objective Findings:  Vitals:   01/19/23 1839 01/19/23 2245 01/20/23 0330 01/20/23 0801  BP: (!) 166/98 134/79 (!) 153/92 (!) 142/88  Pulse: (!) 125 (!) 124 (!) 111 (!) 110  Resp: 16 20 20 18   Temp: 98.4 F (36.9 C) 98.1 F (36.7 C) 98.7 F (37.1 C) 98.1 F (36.7 C)  TempSrc: Oral Oral Oral   SpO2: 99% 100% 98% 100%  Weight:      Height:        Intake/Output Summary (Last 24 hours) at 01/20/2023 1148 Last data filed at 01/19/2023 1900 Gross per 24 hour  Intake 1185.52 ml  Output --  Net 1185.52 ml   Filed Weights   01/13/23 0255  Weight: 42 kg    Examination:  Physical Exam Constitutional:      General: She is not in acute distress.    Appearance: She is ill-appearing. She is not toxic-appearing.  HENT:     Mouth/Throat:     Mouth: Mucous membranes are dry.  Cardiovascular:     Rate and Rhythm: Regular rhythm. Tachycardia present.  Pulmonary:     Effort: No accessory muscle usage.     Breath sounds: Normal breath sounds. Decreased air movement present.  Abdominal:     Palpations: Abdomen is soft.  Neurological:     Mental Status: Mental status is at baseline. She is lethargic.  Psychiatric:        Cognition and Memory: Cognition is impaired.          Scheduled Medications:   vitamin C  500 mg Oral BID   enoxaparin (LOVENOX) injection  30 mg Subcutaneous Q24H   feeding supplement  237 mL Oral TID BM   hydrALAZINE  25 mg Oral Q8H   insulin aspart  0-9 Units Subcutaneous Q4H   multivitamin with minerals  1 tablet Oral Daily   zinc sulfate  220 mg Oral Daily    Continuous Infusions:  sodium chloride Stopped (01/15/23 1827)   0.9 % NaCl with KCl 40 mEq / L 100 mL/hr at 01/20/23 0942   ceFEPime (MAXIPIME) IV 2 g (01/20/23 0302)   vancomycin      PRN Medications:  sodium chloride, hydrALAZINE, ondansetron **OR** ondansetron (ZOFRAN) IV  Antimicrobials from admission:  Anti-infectives (From admission, onward)    Start     Dose/Rate Route Frequency Ordered Stop   01/20/23 1600  vancomycin (VANCOREADY) IVPB 500 mg/100 mL        500 mg 100 mL/hr over 60 Minutes Intravenous Every 24 hours 01/20/23 1130  01/19/23 1500  ceFEPIme (MAXIPIME) 2 g in sodium chloride 0.9 % 100 mL IVPB        2 g 200 mL/hr over 30 Minutes Intravenous Every 12 hours 01/19/23 1326     01/19/23 1415  vancomycin (VANCOCIN) IVPB 1000 mg/200 mL premix        1,000 mg 200 mL/hr over 60 Minutes Intravenous  Once 01/19/23 1326 01/19/23 1710   01/14/23 0600  cefTRIAXone (ROCEPHIN) 2 g in sodium chloride 0.9 % 100 mL IVPB        2 g 200 mL/hr over 30 Minutes Intravenous Every 24 hours 01/13/23 0823 01/18/23 0631   01/14/23 0600  azithromycin (ZITHROMAX) 500 mg in sodium chloride 0.9 % 250 mL IVPB        500 mg 250 mL/hr over 60 Minutes Intravenous Every 24 hours 01/13/23 0823 01/18/23 1017   01/13/23 0400  cefTRIAXone (ROCEPHIN) 2 g in sodium chloride 0.9 % 100 mL IVPB        2 g 200 mL/hr over 30 Minutes Intravenous  Once 01/13/23 0350 01/13/23 0510   01/13/23 0400  azithromycin (ZITHROMAX) 500 mg in sodium chloride 0.9 % 250 mL IVPB        500 mg 250 mL/hr over 60 Minutes Intravenous  Once 01/13/23 0350 01/13/23 [id number]           Data  Reviewed:  I have personally reviewed the following...  CBC: Recent Labs  Lab 01/14/23 0530 01/15/23 0429 01/17/23 0626 01/18/23 0555 01/19/23 1117  WBC 9.1 8.7 5.4 5.0 6.7  HGB 10.4* 10.6* 10.7* 9.9* 9.8*  HCT 32.0* 32.6* 31.9* 30.1* 29.9*  MCV 87.7 86.7 83.9 84.8 86.7  PLT 221 259 286 286 [id number]   Basic Metabolic Panel: Recent Labs  Lab 01/14/23 0530 01/15/23 0429 01/17/23 0626 01/18/23 0555 01/19/23 1117 01/20/23 0530  NA 134* 135 136 135 134*  --   K 3.0* 3.1* 2.8* 3.8 4.0  --   CL 101 96* 100 104 100  --   CO2 24 26 27 28 27   --   GLUCOSE 106* 86 169* 102* 237*  --   BUN 14 13 7* 10 14  --   CREATININE 0.34* 0.39* 0.36* <0.30* 0.38* 0.36*  CALCIUM 8.6* 8.9 8.5* 8.4* 8.5*  --   MG  --  1.5* 1.4* 1.8  --   --   PHOS  --  3.0 2.2* 2.7  --   --    GFR: Estimated Creatinine Clearance: 32.2 mL/min (A) (by C-G formula based on SCr of 0.36 mg/dL (L)). Liver Function Tests: Recent Labs  Lab 01/14/23 0530 01/15/23 0429 01/19/23 1117  AST 22 22 31   ALT 12 14 19   ALKPHOS 58 57 53  BILITOT 0.7 1.0 0.5  PROT 6.3* 6.8 6.4*  ALBUMIN 2.4* 2.4* 2.4*   No results for input(s): "LIPASE", "AMYLASE" in the last 168 hours.  No results for input(s): "AMMONIA" in the last 168 hours. Coagulation Profile: No results for input(s): "INR", "PROTIME" in the last 168 hours. Cardiac Enzymes: No results for input(s): "CKTOTAL", "CKMB", "CKMBINDEX", "TROPONINI" in the last 168 hours. BNP (last 3 results) No results for input(s): "PROBNP" in the last 8760 hours. HbA1C: No results for input(s): "HGBA1C" in the last 72 hours. CBG: Recent Labs  Lab 01/19/23 1537 01/19/23 2107 01/20/23 0013 01/20/23 0455 01/20/23 0803  GLUCAP 205* 271* 125* 182* 109*   Lipid Profile: No results for input(s): "CHOL", "HDL", "LDLCALC", "TRIG", "CHOLHDL", "LDLDIRECT" in the last  72 hours. Thyroid Function Tests: No results for input(s): "TSH", "T4TOTAL", "FREET4", "T3FREE", "THYROIDAB" in the last  72 hours. Anemia Panel: No results for input(s): "VITAMINB12", "FOLATE", "FERRITIN", "TIBC", "IRON", "RETICCTPCT" in the last 72 hours. Most Recent Urinalysis On File:     Component Value Date/Time   COLORURINE YELLOW (A) 01/19/2023 1003   APPEARANCEUR CLEAR 01/19/2023 1003   APPEARANCEUR Clear 06/30/2014 1501   LABSPEC 1.020 01/19/2023 1003   LABSPEC 1.011 06/30/2014 1501   PHURINE 6.5 01/19/2023 1003   GLUCOSEU >1,000 (A) 01/19/2023 1003   GLUCOSEU Negative 06/30/2014 1501   HGBUR NEGATIVE 01/19/2023 1003   BILIRUBINUR NEGATIVE 01/19/2023 1003   BILIRUBINUR Negative 06/30/2014 1501   KETONESUR NEGATIVE 01/19/2023 1003   PROTEINUR NEGATIVE 01/19/2023 1003   NITRITE NEGATIVE 01/19/2023 1003   LEUKOCYTESUR NEGATIVE 01/19/2023 1003   LEUKOCYTESUR Trace 06/30/2014 1501   Sepsis Labs: @LABRCNTIP (procalcitonin:4,lacticidven:4) Microbiology: Recent Results (from the past 240 hour(s))  Blood culture (routine x 2)     Status: None   Collection Time: 01/13/23  3:42 AM   Specimen: BLOOD  Result Value Ref Range Status   Specimen Description   Final    BLOOD BLOOD RIGHT ARM Performed at [facility], [address] [place], [place] [address]    Special Requests   Final    BOTTLES DRAWN AEROBIC AND ANAEROBIC Blood Culture adequate volume Performed at [facility], [address], [place], [place] [address]    Culture   Final    NO GROWTH 5 DAYS Performed at [facility], [place] [address], [place], [place] [address]    Report Status 01/18/2023 FINAL  Final  Resp panel by RT-PCR (RSV, Flu A&B, Covid) Anterior Nasal Swab     Status: None   Collection Time: 01/13/23  3:45 AM   Specimen: Anterior Nasal Swab  Result Value Ref Range Status   SARS Coronavirus 2 by RT PCR NEGATIVE NEGATIVE Final    Comment: (NOTE) SARS-CoV-2 target nucleic acids are NOT DETECTED.  The SARS-CoV-2 RNA is generally detectable in upper respiratory specimens during the acute phase  of infection. The lowest concentration of SARS-CoV-2 viral copies this assay can detect is 138 copies/mL. A negative result does not preclude SARS-Cov-2 infection and should not be used as the sole basis for treatment or other patient management decisions. A negative result may occur with  improper specimen collection/handling, submission of specimen other than nasopharyngeal swab, presence of viral mutation(s) within the areas targeted by this assay, and inadequate number of viral copies(<138 copies/mL). A negative result must be combined with clinical observations, patient history, and epidemiological information. The expected result is Negative.  Fact Sheet for Patients:  EntrepreneurPulse.com.au  Fact Sheet for Healthcare Providers:  IncredibleEmployment.be  This test is no t yet approved or cleared by the [place] FDA and  has been authorized for detection and/or diagnosis of SARS-CoV-2 by FDA under an Emergency Use Authorization (EUA). This EUA will remain  in effect (meaning this test can be used) for the duration of the COVID-19 declaration under Section 564(b)(1) of the Act, 21 U.S.C.section 360bbb-3(b)(1), unless the authorization is terminated  or revoked sooner.       Influenza A by PCR NEGATIVE NEGATIVE Final   Influenza B by PCR NEGATIVE NEGATIVE Final    Comment: (NOTE) The Xpert Xpress SARS-CoV-2/FLU/RSV plus assay is intended as an aid in the diagnosis of influenza from Nasopharyngeal swab specimens and should not be used as a sole basis for treatment.  Nasal washings and aspirates are unacceptable for Xpert Xpress SARS-CoV-2/FLU/RSV testing.  Fact Sheet for Patients: EntrepreneurPulse.com.au  Fact Sheet for Healthcare Providers: IncredibleEmployment.be  This test is not yet approved or cleared by the [place] FDA and has been authorized for detection and/or diagnosis of  SARS-CoV-2 by FDA under an Emergency Use Authorization (EUA). This EUA will remain in effect (meaning this test can be used) for the duration of the COVID-19 declaration under Section 564(b)(1) of the Act, 21 U.S.C. section 360bbb-3(b)(1), unless the authorization is terminated or revoked.     Resp Syncytial Virus by PCR NEGATIVE NEGATIVE Final    Comment: (NOTE) Fact Sheet for Patients: EntrepreneurPulse.com.au  Fact Sheet for Healthcare Providers: IncredibleEmployment.be  This test is not yet approved or cleared by the [place] FDA and has been authorized for detection and/or diagnosis of SARS-CoV-2 by FDA under an Emergency Use Authorization (EUA). This EUA will remain in effect (meaning this test can be used) for the duration of the COVID-19 declaration under Section 564(b)(1) of the Act, 21 U.S.C. section 360bbb-3(b)(1), unless the authorization is terminated or revoked.  Performed at [facility], [address] [place], [place] [address]   Blood culture (routine x 2)     Status: None   Collection Time: 01/13/23  9:08 AM   Specimen: BLOOD  Result Value Ref Range Status   Specimen Description   Final    BLOOD BLOOD LEFT HAND Performed at [facility], [address] [place], [place] [address]    Special Requests   Final    BOTTLES DRAWN AEROBIC AND ANAEROBIC Blood Culture results may not be optimal due to an inadequate volume of blood received in culture bottles Performed at [facility], [address], [place], [place] [address]    Culture   Final    NO GROWTH 5 DAYS Performed at [facility], [place] [address], [place], [place] [address]    Report Status 01/18/2023 FINAL  Final  Culture, blood (Routine X 2) w Reflex to ID Panel     Status: None (Preliminary result)   Collection Time: 01/19/23 11:19 AM   Specimen: BLOOD  Result Value Ref Range Status   Specimen Description BLOOD BLOOD  LEFT HAND  Final   Special Requests   Final    BOTTLES DRAWN AEROBIC AND ANAEROBIC Blood Culture adequate volume   Culture   Final    NO GROWTH < 24 HOURS Performed at [facility], [address], [place], [place] [address]    Report Status PENDING  Incomplete  Culture, blood (Routine X 2) w Reflex to ID Panel     Status: None (Preliminary result)   Collection Time: 01/19/23  1:01 PM   Specimen: BLOOD  Result Value Ref Range Status   Specimen Description BLOOD BLOOD LEFT HAND  Final   Special Requests   Final    BOTTLES DRAWN AEROBIC AND ANAEROBIC Blood Culture adequate volume   Culture   Final    NO GROWTH < 24 HOURS Performed at [facility], [address] [place], [place] [address]    Report Status PENDING  Incomplete  MRSA Next Gen by PCR, Nasal     Status: None   Collection Time: 01/19/23  2:30 PM   Specimen: Nasal Mucosa; Nasal Swab  Result Value Ref Range Status   MRSA by PCR Next Gen NOT DETECTED NOT DETECTED Final    Comment: (NOTE) The GeneXpert MRSA Assay (FDA approved for  NASAL specimens only), is one component of a comprehensive MRSA colonization surveillance program. It is not intended to diagnose MRSA infection nor to guide or monitor treatment for MRSA infections. Test performance is not FDA approved in patients less than 44 years old. Performed at [facility], [address] [place], [place] [address]       Radiology Studies last 3 days: CT CHEST WO CONTRAST  Result Date: 01/19/2023 CLINICAL DATA:  Pneumonia EXAM: CT CHEST WITHOUT CONTRAST TECHNIQUE: Multidetector CT imaging of the chest was performed following the standard protocol without IV contrast. RADIATION DOSE REDUCTION: This exam was performed according to the departmental dose-optimization program which includes automated exposure control, adjustment of the mA and/or kV according to patient size and/or use of iterative reconstruction technique. COMPARISON:  Chest  radiograph 01/19/2023 FINDINGS: Cardiovascular: Extensive atherosclerotic calcifications aorta, common carotid arteries, minimally coronary arteries. Upper normal caliber ascending thoracic aorta 3.9 cm transverse. No pericardial effusion. Mediastinum/Nodes: Dilated esophagus containing fluid and air, questionably some food debris as well. This could reflect motility disorder, reflux, or distal esophageal obstruction. Scattered normal sized mediastinal lymph nodes. Upper normal precarinal lymph node 10 mm short axis. No definite thoracic adenopathy. Lungs/Pleura: BILATERAL pleural effusions, partially loculated on LEFT. Patchy infiltrates RIGHT lower lobe. Compressive atelectasis in BILATERAL lower lobes. Additional small focus of infiltrate RIGHT middle lobe. No pneumothorax or mass. Upper Abdomen: Small volume perihepatic ascites. Stomach distended by food debris. Scattered soft tissue edema with poor definition of intra-abdominal fat planes. Remaining visualized upper abdomen unremarkable. Musculoskeletal: Osseous demineralization. Marked compression deformity of a lower thoracic vertebra, T9. Small sclerotic focus at mid to distal sternum question sclerotic lesion or healed fracture, favor fracture. IMPRESSION: Dilated esophagus containing fluid and air as well as stomach distended by food debris,, questionably some food debris as well, question gastroesophageal reflux or motility disorder. BILATERAL pleural effusions, partially loculated on LEFT. Patchy infiltrates RIGHT lower lobe and RIGHT middle lobe consistent with pneumonia. Small volume perihepatic ascites. Marked compression deformity of a lower thoracic vertebra, T9. Extensive atherosclerotic disease changes including coronary arteries. Small sclerotic focus at mid to distal sternum question sclerotic lesion or healed fracture, favor fracture. Aortic Atherosclerosis (ICD10-I70.0). Electronically Signed   By: Lavonia Dana M.D.   On: 01/19/2023 14:23    DG Chest Port 1 View  Result Date: 01/19/2023 CLINICAL DATA:  Fever. EXAM: PORTABLE CHEST 1 VIEW COMPARISON:  January 13, 2023. FINDINGS: The heart size and mediastinal contours are within normal limits. Increased left basilar opacity is noted concerning for worsening pneumonia or atelectasis with associated pleural effusion. Mild right basilar atelectasis or infiltrate is noted. The visualized skeletal structures are unremarkable. IMPRESSION: Increased left basilar opacity as described above. Mild right basilar atelectasis or infiltrate is again noted. Electronically Signed   By: Marijo Conception M.D.   On: 01/19/2023 11:01             LOS: 7 days     Emeterio Reeve, DO [facility] 01/20/2023, 11:48 AM    Dictation software may have been used to generate the above note. Typos may occur and escape review in typed/dictated notes. Please contact Dr Sheppard Coil directly for clarity if needed.  Staff may message me via secure chat in [place]  but this may not receive an immediate response,  please page me for urgent matters!  If 7PM-7AM, please contact night coverage [email or web address]

## 2023-01-20 NOTE — Procedures (Signed)
Patient was brought to [place] suite for ordered thoracentesis secondary to pleural effusions seen on CT. Both lungs were scanned with ultrasound and no safely accessible fluid pocket was visible on ultrasound exam. No thoracentesis was performed today, order subsequently changed to limited chest ultrasound. Please contact IR team with any questions or concerns.  Lura Em, PA-C 01/20/2023 1:38 PM

## 2023-01-20 NOTE — Progress Notes (Signed)
Discussions had with primary team-shared son at bedside expressing interest in home hospice services.  Met with son at bedside. He shares that goals remain clear and without any acute palliative needs at this time.  Not interested in home hospice services or outpatient palliative care. Primary team updated. PMT to follow peripherally and will reengage as needed.  No Charge.  Theodoro Grist, DNP, AGNP-C Palliative Medicine  Please call Palliative Medicine team phone with any questions [phone]. For individual providers please see AMION.

## 2023-01-20 NOTE — Consult Note (Signed)
Pharmacy Antibiotic Note  Melanie Turner is a 89 y.o. female admitted on 01/13/2023 with  generalized weakness .  Patient was treated for pneumonia for 6 days with no improvement of symptoms; still febrile and now tachycardic on EKG. New CXR showing increased basilar opacity. Blood cultures recollected. Pharmacy has been consulted for Vancomycin and Cefepime dosing.  Antibiotic Day 2 of Vancomycin and Cefepime, Day 8 total(was treated with Azithromycin and Rocephin)  Plan: Continue Vancomycin 500 mg IV Q 24 hrs. Goal AUC 400-550. Expected AUC: 530.8 Cmin: 15.1 SCr used: 0.8(actual 0.31), Vd used: 0.72 TBW<IBW  Cefepime 2g IV Q12 hours   Height: 5\' 2"  (157.5 cm) Weight: 42 kg (92 lb 9.5 oz) IBW/kg (Calculated) : 50.1  Temp (24hrs), Avg:98.3 F (36.8 C), Min:98.1 F (36.7 C), Max:98.7 F (37.1 C)  Recent Labs  Lab 01/14/23 0530 01/15/23 0429 01/15/23 1052 01/17/23 0626 01/18/23 0555 01/19/23 1117 01/20/23 0530  WBC 9.1 8.7  --  5.4 5.0 6.7  --   CREATININE 0.34* 0.39*  --  0.36* <0.30* 0.38* 0.36*  LATICACIDVEN  --   --  0.9  --   --  1.7  --      Estimated Creatinine Clearance: 32.2 mL/min (A) (by C-G formula based on SCr of 0.36 mg/dL (L)).    No Known Allergies  Antimicrobials this admission: Azithro/Rocephin 3/15 >> 3/20 Vanc/Cefepime 3/21 >>   Dose adjustments this admission: N/A  Microbiology results: 3/21 BCx: NGTD 3/21 UCx: pending  3/21 MRSA PCR: negative 3/15 Bcx: NGF  Thank you for allowing pharmacy to be a part of this patient's care.  Rily Nickey A Lilyonna Steidle 01/20/2023 11:23 AM

## 2023-01-21 DIAGNOSIS — J9601 Acute respiratory failure with hypoxia: Secondary | ICD-10-CM | POA: Diagnosis not present

## 2023-01-21 LAB — COMPREHENSIVE METABOLIC PANEL
ALT: 26 U/L (ref 0–44)
AST: 30 U/L (ref 15–41)
Albumin: 2.3 g/dL — ABNORMAL LOW (ref 3.5–5.0)
Alkaline Phosphatase: 51 U/L (ref 38–126)
Anion gap: 5 (ref 5–15)
BUN: 17 mg/dL (ref 8–23)
CO2: 30 mmol/L (ref 22–32)
Calcium: 8.5 mg/dL — ABNORMAL LOW (ref 8.9–10.3)
Chloride: 99 mmol/L (ref 98–111)
Creatinine, Ser: 0.5 mg/dL (ref 0.44–1.00)
GFR, Estimated: >60 mL/min (ref >60)
Glucose, Bld: 238 mg/dL — ABNORMAL HIGH (ref 70–99)
Potassium: 4.3 mmol/L (ref 3.5–5.1)
Sodium: 134 mmol/L — ABNORMAL LOW (ref 135–145)
Total Bilirubin: 0.4 mg/dL (ref 0.3–1.2)
Total Protein: 6.4 g/dL — ABNORMAL LOW (ref 6.5–8.1)

## 2023-01-21 LAB — CBC
HCT: 30.2 % — ABNORMAL LOW (ref 36.0–46.0)
Hemoglobin: 9.7 g/dL — ABNORMAL LOW (ref 12.0–15.0)
MCH: 27.9 pg (ref 26.0–34.0)
MCHC: 32.1 g/dL (ref 30.0–36.0)
MCV: 86.8 fL (ref 80.0–100.0)
Platelets: 376 10*3/uL (ref 150–400)
RBC: 3.48 MIL/uL — ABNORMAL LOW (ref 3.87–5.11)
RDW: 14.3 % (ref 11.5–15.5)
WBC: 5 10*3/uL (ref 4.0–10.5)
nRBC: 0 % (ref 0.0–0.2)

## 2023-01-21 LAB — GLUCOSE, CAPILLARY
Glucose-Capillary: 129 mg/dL — ABNORMAL HIGH (ref 70–99)
Glucose-Capillary: 151 mg/dL — ABNORMAL HIGH (ref 70–99)
Glucose-Capillary: 189 mg/dL — ABNORMAL HIGH (ref 70–99)
Glucose-Capillary: 201 mg/dL — ABNORMAL HIGH (ref 70–99)
Glucose-Capillary: 243 mg/dL — ABNORMAL HIGH (ref 70–99)
Glucose-Capillary: 249 mg/dL — ABNORMAL HIGH (ref 70–99)

## 2023-01-21 LAB — MAGNESIUM: Magnesium: 1.7 mg/dL (ref 1.7–2.4)

## 2023-01-21 NOTE — Progress Notes (Signed)
PROGRESS NOTE    Melanie Turner   [id number] DOB: [birth year]  DOA: 01/13/2023 Date of Service: 01/21/23 PCP: Kirk Ruths, MD     Brief Narrative / Hospital Course:  This 89 yrs old female with PMH significant for dementia, type 2 diabetes, hypertension presented in the ED 01/13/2023 with generalized weakness. History was obtained from son. Patient noted to be bedbound and nonverbal at baseline. Son reported patient was noted to be more dehydrated and weak . She had an episode of vomiting 03/14 and been feeling generally weak afterwards. He denied any fever or chills.  03/15: ED workup reveals temp. 99.6, heart rate 100, respiratory rate 20, blood pressure 160/90. She was hypoxic requiring 2 L of supplemental oxygen. Labs include WBC 10.3, Lactic acid 4.2, Chest x-ray showed hazy opacity in the right lower lung. CT abdomen and pelvis showed multi lobar pneumonia as well as moderate stool burden. Patient is admitted for sepsis likely secondary to decubitus ulcers and multifocal pneumonia. Started ceftriaxone and azithromycin, supplemental O2  03/16: relatively stable on O2, awaiting cultures.  03/17: BCx NGx2d. IPAL note --> DNR.  03/18: SLP recs NPO d/t high aspiration risk. Palliative care saw patient.  03/19: Palliative and SLP d/w family again, goals are set. Family remains in agreement with DNR and wants to treat the treatable with plans to d/c home.  03/20: confirmed w/ son that goal is for patient to go home, we discussed hospice at home as a potential source of support, he is willing to talk to them.  03/21: (+)fever and sinus tachycardia on EKG, w/u found CXR (+)increased L basilar opacity --> CT chest pending, CBC/CMP no concerns, UA neg, BCx collected. Hospice to see patient/family. Patient improved some w/ po intake.  03/22: still febrile tachy. Pending thoracentesis but not enough fluid to draw off. Edema RUE, [place] no DVT. Palliative care spoke w/ family who now does not  want hospice. Will revisit as needed.  03/23: HR and temp improved. Remains on IV abx. More alert today.   Consultants:  Palliative Care   Procedures: None       ASSESSMENT & PLAN:   Principal Problem:   Acute respiratory failure with hypoxia (HCC) Active Problems:   Sepsis ([place])   Pneumonia   Hyperglycemia due to type 2 diabetes mellitus (HCC)   Benign essential HTN   Dementia (HCC)   Lactic acidosis   Protein-calorie malnutrition, severe  Acute hypoxic respiratory failure: resolved Likely secondary to multifocal pneumonia.  Patient does not use oxygen at baseline. Continue supplemental oxygen as needed    Recurrent sepsis uncertain source vs SIRS, possible worsening pneumonia vs cellulitis  uncertain source, had just finished 5 days ceftriaxone + azithro for CAP but concern for persistent/worse pneumonia and pleural effusion  BCx NGx2d Continue cefepime + vancomycin  Low threshold for ID consult but pt seems to be improving  D/c IV fluid - pt has been eating better   Severe sepsis POA sec. to multifocal pneumonia: Patient presented with tachycardia,  tachypnea,  lactic acidosis, leukocytosis. Chest x-ray showed multifocal pneumonia. COVID, Influenza, RSV negative. Blood cultures No growth to date, urine culture contaminated. Urinary strep and Legionella antigen > Negative Completed antibiotics x 5 days ceftriaxone + azithromycin, but opacity increased on CXR 03/21 --> PNA and pleural effusion w/ loculations on CT chest, pt O2 requirement has resolved  Pleural effusion, loculated Unable to perform thoracentesis - insufficient fluid  SpO2 check w/ VS   Type 2 diabetes: She is  found to have blood sugar of 400 on presentation. Continue regular insulin sliding scale. Speech evaluation recommended Dysphagia 1 thin liquids.   Dementia, Advanced: Patient does have end-stage dementia with minimal function at home as well as decreased p.o. intake.  Continue Aricept,  Remeron and trazodone.   SLP following Palliative care following intermittently  Family wants to treat the treatable and was open to speaking w/ hospice but later declined hospice services - goal is to take patient home    Hypertension: Continue hydralazine as needed for hypertension.   Decubitus Bed ulcers: Continue wound care. Change body posture q4hrs  Lactic acidosis: resolved Likely in the setting of  multifocal pneumonia.   DVT prophylaxis: SCD Pertinent IV fluids/nutrition: dysphagia diet but po intake has been minimal so remains on IV fluids  Central lines / invasive devices: none  Code Status: DNR  Current Admission Status: inpatient  TOC needs / Dispo plan: home health / possible hospice - fmaily want to take her home when able  Barriers to discharge / significant pending items: remains on IV abx pending clinical improvement but seems better today, anticipate may be appropriate for discharge tomorrow if remains normal HR and no fever              Subjective / Brief ROS:  Patient is alert today and answering yes/no questions - denies pain,    Family Communication: discussed w/ son Antonio at bedside on rounds    Objective Findings:  Vitals:   01/20/23 1300 01/20/23 1818 01/21/23 0452 01/21/23 0725  BP: (!) 155/76 (!) 151/85 (!) 149/85 (!) 159/90  Pulse: (!) 110 (!) 104 99 97  Resp:  18 20 18   Temp:  97.8 F (36.6 C) 98.6 F (37 C) 97.8 F (36.6 C)  TempSrc:    Axillary  SpO2: 100% 100% 100% 100%  Weight:      Height:        Intake/Output Summary (Last 24 hours) at 01/21/2023 1103 Last data filed at 01/21/2023 0500 Gross per 24 hour  Intake --  Output 500 ml  Net -500 ml   Filed Weights   01/13/23 0255  Weight: 42 kg    Examination:  Physical Exam Constitutional:      General: She is not in acute distress.    Appearance: She is ill-appearing. She is not toxic-appearing.  HENT:     Mouth/Throat:     Mouth: Mucous membranes are dry.   Cardiovascular:     Rate and Rhythm: Regular rhythm. Tachycardia present.  Pulmonary:     Effort: No accessory muscle usage.     Breath sounds: Normal breath sounds. Decreased air movement present.  Abdominal:     Palpations: Abdomen is soft.  Neurological:     Mental Status: Mental status is at baseline. She is lethargic.  Psychiatric:        Cognition and Memory: Cognition is impaired.         Scheduled Medications:   vitamin C  500 mg Oral BID   enoxaparin (LOVENOX) injection  30 mg Subcutaneous Q24H   feeding supplement  237 mL Oral TID BM   hydrALAZINE  25 mg Oral Q8H   insulin aspart  0-9 Units Subcutaneous Q4H   multivitamin with minerals  1 tablet Oral Daily   zinc sulfate  220 mg Oral Daily    Continuous Infusions:  sodium chloride Stopped (01/15/23 1827)   ceFEPime (MAXIPIME) IV 2 g (01/21/23 0540)   vancomycin 500 mg (01/20/23 1634)  PRN Medications:  sodium chloride, hydrALAZINE, ondansetron **OR** ondansetron (ZOFRAN) IV  Antimicrobials from admission:  Anti-infectives (From admission, onward)    Start     Dose/Rate Route Frequency Ordered Stop   01/20/23 1600  vancomycin (VANCOREADY) IVPB 500 mg/100 mL        500 mg 100 mL/hr over 60 Minutes Intravenous Every 24 hours 01/20/23 1130     01/19/23 1500  ceFEPIme (MAXIPIME) 2 g in sodium chloride 0.9 % 100 mL IVPB        2 g 200 mL/hr over 30 Minutes Intravenous Every 12 hours 01/19/23 1326     01/19/23 1415  vancomycin (VANCOCIN) IVPB 1000 mg/200 mL premix        1,000 mg 200 mL/hr over 60 Minutes Intravenous  Once 01/19/23 1326 01/19/23 1710   01/14/23 0600  cefTRIAXone (ROCEPHIN) 2 g in sodium chloride 0.9 % 100 mL IVPB        2 g 200 mL/hr over 30 Minutes Intravenous Every 24 hours 01/13/23 0823 01/18/23 0631   01/14/23 0600  azithromycin (ZITHROMAX) 500 mg in sodium chloride 0.9 % 250 mL IVPB        500 mg 250 mL/hr over 60 Minutes Intravenous Every 24 hours 01/13/23 0823 01/18/23 1017    01/13/23 0400  cefTRIAXone (ROCEPHIN) 2 g in sodium chloride 0.9 % 100 mL IVPB        2 g 200 mL/hr over 30 Minutes Intravenous  Once 01/13/23 0350 01/13/23 0510   01/13/23 0400  azithromycin (ZITHROMAX) 500 mg in sodium chloride 0.9 % 250 mL IVPB        500 mg 250 mL/hr over 60 Minutes Intravenous  Once 01/13/23 0350 01/13/23 [id number]           Data Reviewed:  I have personally reviewed the following...  CBC: Recent Labs  Lab 01/15/23 0429 01/17/23 0626 01/18/23 0555 01/19/23 1117  WBC 8.7 5.4 5.0 6.7  HGB 10.6* 10.7* 9.9* 9.8*  HCT 32.6* 31.9* 30.1* 29.9*  MCV 86.7 83.9 84.8 86.7  PLT 259 286 286 [id number]   Basic Metabolic Panel: Recent Labs  Lab 01/15/23 0429 01/17/23 0626 01/18/23 0555 01/19/23 1117 01/20/23 0530  NA 135 136 135 134*  --   K 3.1* 2.8* 3.8 4.0  --   CL 96* 100 104 100  --   CO2 26 27 28 27   --   GLUCOSE 86 169* 102* 237*  --   BUN 13 7* 10 14  --   CREATININE 0.39* 0.36* <0.30* 0.38* 0.36*  CALCIUM 8.9 8.5* 8.4* 8.5*  --   MG 1.5* 1.4* 1.8  --   --   PHOS 3.0 2.2* 2.7  --   --    GFR: Estimated Creatinine Clearance: 32.2 mL/min (A) (by C-G formula based on SCr of 0.36 mg/dL (L)). Liver Function Tests: Recent Labs  Lab 01/15/23 0429 01/19/23 1117  AST 22 31  ALT 14 19  ALKPHOS 57 53  BILITOT 1.0 0.5  PROT 6.8 6.4*  ALBUMIN 2.4* 2.4*   No results for input(s): "LIPASE", "AMYLASE" in the last 168 hours.  No results for input(s): "AMMONIA" in the last 168 hours. Coagulation Profile: No results for input(s): "INR", "PROTIME" in the last 168 hours. Cardiac Enzymes: No results for input(s): "CKTOTAL", "CKMB", "CKMBINDEX", "TROPONINI" in the last 168 hours. BNP (last 3 results) No results for input(s): "PROBNP" in the last 8760 hours. HbA1C: No results for input(s): "HGBA1C" in the last 72 hours. CBG: Recent  Labs  Lab 01/20/23 1815 01/20/23 2101 01/21/23 0038 01/21/23 0452 01/21/23 0757  GLUCAP 179* 145* 129* 201* 151*   Lipid  Profile: No results for input(s): "CHOL", "HDL", "LDLCALC", "TRIG", "CHOLHDL", "LDLDIRECT" in the last 72 hours. Thyroid Function Tests: No results for input(s): "TSH", "T4TOTAL", "FREET4", "T3FREE", "THYROIDAB" in the last 72 hours. Anemia Panel: No results for input(s): "VITAMINB12", "FOLATE", "FERRITIN", "TIBC", "IRON", "RETICCTPCT" in the last 72 hours. Most Recent Urinalysis On File:     Component Value Date/Time   COLORURINE YELLOW (A) 01/19/2023 1003   APPEARANCEUR CLEAR 01/19/2023 1003   APPEARANCEUR Clear 06/30/2014 1501   LABSPEC 1.020 01/19/2023 1003   LABSPEC 1.011 06/30/2014 1501   PHURINE 6.5 01/19/2023 1003   GLUCOSEU >1,000 (A) 01/19/2023 1003   GLUCOSEU Negative 06/30/2014 1501   HGBUR NEGATIVE 01/19/2023 1003   BILIRUBINUR NEGATIVE 01/19/2023 1003   BILIRUBINUR Negative 06/30/2014 1501   KETONESUR NEGATIVE 01/19/2023 1003   PROTEINUR NEGATIVE 01/19/2023 1003   NITRITE NEGATIVE 01/19/2023 1003   LEUKOCYTESUR NEGATIVE 01/19/2023 1003   LEUKOCYTESUR Trace 06/30/2014 1501   Sepsis Labs: @LABRCNTIP (procalcitonin:4,lacticidven:4) Microbiology: Recent Results (from the past 240 hour(s))  Blood culture (routine x 2)     Status: None   Collection Time: 01/13/23  3:42 AM   Specimen: BLOOD  Result Value Ref Range Status   Specimen Description   Final    BLOOD BLOOD RIGHT ARM Performed at [facility], [address], [place], [place] [address]    Special Requests   Final    BOTTLES DRAWN AEROBIC AND ANAEROBIC Blood Culture adequate volume Performed at [facility], [address], [place], [place] [address]    Culture   Final    NO GROWTH 5 DAYS Performed at [facility], [place] [address], [place],  [address]    Report Status 01/18/2023 FINAL  Final  Resp panel by RT-PCR (RSV, Flu A&B, Covid) Anterior Nasal Swab     Status: None   Collection Time: 01/13/23  3:45 AM   Specimen: Anterior Nasal Swab  Result Value Ref Range Status    SARS Coronavirus 2 by RT PCR NEGATIVE NEGATIVE Final    Comment: (NOTE) SARS-CoV-2 target nucleic acids are NOT DETECTED.  The SARS-CoV-2 RNA is generally detectable in upper respiratory specimens during the acute phase of infection. The lowest concentration of SARS-CoV-2 viral copies this assay can detect is 138 copies/mL. A negative result does not preclude SARS-Cov-2 infection and should not be used as the sole basis for treatment or other patient management decisions. A negative result may occur with  improper specimen collection/handling, submission of specimen other than nasopharyngeal swab, presence of viral mutation(s) within the areas targeted by this assay, and inadequate number of viral copies(<138 copies/mL). A negative result must be combined with clinical observations, patient history, and epidemiological information. The expected result is Negative.  Fact Sheet for Patients:  EntrepreneurPulse.com.au  Fact Sheet for Healthcare Providers:  IncredibleEmployment.be  This test is no t yet approved or cleared by the [place] FDA and  has been authorized for detection and/or diagnosis of SARS-CoV-2 by FDA under an Emergency Use Authorization (EUA). This EUA will remain  in effect (meaning this test can be used) for the duration of the COVID-19 declaration under Section 564(b)(1) of the Act, 21 U.S.C.section 360bbb-3(b)(1), unless the authorization is terminated  or revoked sooner.       Influenza A by PCR NEGATIVE NEGATIVE Final   Influenza B by PCR NEGATIVE NEGATIVE  Final    Comment: (NOTE) The Xpert Xpress SARS-CoV-2/FLU/RSV plus assay is intended as an aid in the diagnosis of influenza from Nasopharyngeal swab specimens and should not be used as a sole basis for treatment. Nasal washings and aspirates are unacceptable for Xpert Xpress SARS-CoV-2/FLU/RSV testing.  Fact Sheet for  Patients: EntrepreneurPulse.com.au  Fact Sheet for Healthcare Providers: IncredibleEmployment.be  This test is not yet approved or cleared by the [place] FDA and has been authorized for detection and/or diagnosis of SARS-CoV-2 by FDA under an Emergency Use Authorization (EUA). This EUA will remain in effect (meaning this test can be used) for the duration of the COVID-19 declaration under Section 564(b)(1) of the Act, 21 U.S.C. section 360bbb-3(b)(1), unless the authorization is terminated or revoked.     Resp Syncytial Virus by PCR NEGATIVE NEGATIVE Final    Comment: (NOTE) Fact Sheet for Patients: EntrepreneurPulse.com.au  Fact Sheet for Healthcare Providers: IncredibleEmployment.be  This test is not yet approved or cleared by the [place] FDA and has been authorized for detection and/or diagnosis of SARS-CoV-2 by FDA under an Emergency Use Authorization (EUA). This EUA will remain in effect (meaning this test can be used) for the duration of the COVID-19 declaration under Section 564(b)(1) of the Act, 21 U.S.C. section 360bbb-3(b)(1), unless the authorization is terminated or revoked.  Performed at [facility], [address] [place], [place] [address]   Blood culture (routine x 2)     Status: None   Collection Time: 01/13/23  9:08 AM   Specimen: BLOOD  Result Value Ref Range Status   Specimen Description   Final    BLOOD BLOOD LEFT HAND Performed at [facility], [address] [place], [place] [address]    Special Requests   Final    BOTTLES DRAWN AEROBIC AND ANAEROBIC Blood Culture results may not be optimal due to an inadequate volume of blood received in culture bottles Performed at [facility], [address] [place], [place] [address]    Culture   Final    NO GROWTH 5 DAYS Performed at [facility], [place] [address] [place], [place]  [address]    Report Status 01/18/2023 FINAL  Final  Urine Culture (for pregnant, neutropenic or urologic patients or patients with an indwelling urinary catheter)     Status: None   Collection Time: 01/19/23 10:03 AM   Specimen: Urine, Clean Catch  Result Value Ref Range Status   Specimen Description   Final    URINE, CLEAN CATCH Performed at [facility], [address] [place], [place] [address]    Special Requests   Final    NONE Performed at [facility], [address], [place], [place] [address]    Culture   Final    NO GROWTH Performed at [facility], [place] [address], [place], [place] [address]    Report Status 01/20/2023 FINAL  Final  Culture, blood (Routine X 2) w Reflex to ID Panel     Status: None (Preliminary result)   Collection Time: 01/19/23 11:19 AM   Specimen: BLOOD  Result Value Ref Range Status   Specimen Description BLOOD BLOOD LEFT HAND  Final   Special Requests   Final    BOTTLES DRAWN AEROBIC AND ANAEROBIC Blood Culture adequate volume   Culture   Final    NO GROWTH 2 DAYS Performed at [facility], [address] [place], [place] [address]    Report Status PENDING  Incomplete  Culture, blood (Routine X 2) w Reflex to ID Panel     Status: None (Preliminary result)   Collection Time: 01/19/23  1:01 PM   Specimen: BLOOD  Result Value Ref Range Status   Specimen Description BLOOD BLOOD LEFT HAND  Final   Special Requests   Final    BOTTLES DRAWN AEROBIC AND ANAEROBIC Blood Culture adequate volume   Culture   Final    NO GROWTH 2 DAYS Performed at [facility], [address], [place], [place] [address]    Report Status PENDING  Incomplete  MRSA Next Gen by PCR, Nasal     Status: None   Collection Time: 01/19/23  2:30 PM   Specimen: Nasal Mucosa; Nasal Swab  Result Value Ref Range Status   MRSA by PCR Next Gen NOT DETECTED NOT DETECTED Final    Comment: (NOTE) The GeneXpert MRSA Assay (FDA approved for  NASAL specimens only), is one component of a comprehensive MRSA colonization surveillance program. It is not intended to diagnose MRSA infection nor to guide or monitor treatment for MRSA infections. Test performance is not FDA approved in patients less than 8 years old. Performed at [facility], [address] [place], [place] [address]       Radiology Studies last 3 days: US Venous Img Upper Uni Right(DVT)  Result Date: 01/20/2023 CLINICAL DATA:  Pain and swelling EXAM: Right UPPER EXTREMITY VENOUS DOPPLER ULTRASOUND TECHNIQUE: Gray-scale sonography with graded compression, as well as color Doppler and duplex ultrasound were performed to evaluate the upper extremity deep venous system from the level of the subclavian vein and including the jugular, axillary, basilic, radial, ulnar and upper cephalic vein. Spectral Doppler was utilized to evaluate flow at rest and with distal augmentation maneuvers. COMPARISON:  None Available. FINDINGS: Contralateral Subclavian Vein: Respiratory phasicity is normal and symmetric with the symptomatic side. No evidence of thrombus. Normal compressibility. Internal Jugular Vein: No evidence of thrombus. Normal compressibility, respiratory phasicity and response to augmentation. Subclavian Vein: No evidence of thrombus. Normal compressibility, respiratory phasicity and response to augmentation. Axillary Vein: No evidence of thrombus. Normal compressibility, respiratory phasicity and response to augmentation. Cephalic Vein: No evidence of thrombus. Normal compressibility, respiratory phasicity and response to augmentation. Basilic Vein: No evidence of thrombus. Normal compressibility, respiratory phasicity and response to augmentation. Brachial Veins: No evidence of thrombus. Normal compressibility, respiratory phasicity and response to augmentation. Radial Veins: No evidence of thrombus. Normal compressibility, respiratory phasicity and response to  augmentation. Ulnar Veins: No evidence of thrombus. Normal compressibility, respiratory phasicity and response to augmentation. Venous Reflux:  None visualized. Other Findings: There is edema in subcutaneous plane in the right upper arm without any loculated fluid collections. IMPRESSION: No evidence of DVT within the right upper extremity. There is edema in subcutaneous plane in the right upper arm without any loculated fluid collections. This may suggest contusion or cellulitis. Electronically Signed   By: Elmer Picker M.D.   On: 01/20/2023 18:33   [place] CHEST (PLEURAL EFFUSION)  Result Date: 01/20/2023 CLINICAL DATA:  Pleural effusion, assess for thoracentesis EXAM: CHEST ULTRASOUND COMPARISON:  01/19/2023 FINDINGS: Very small pleural effusions noted bilaterally, similar to the CT. There is not enough fluid to warrant therapeutic thoracentesis. Procedure not performed. IMPRESSION: Very small bilateral pleural effusions. Electronically Signed   By: Jerilynn Mages.  Shick M.D.   On: 01/20/2023 13:56   CT CHEST WO CONTRAST  Result Date: 01/19/2023 CLINICAL DATA:  Pneumonia EXAM: CT CHEST WITHOUT CONTRAST TECHNIQUE: Multidetector  CT imaging of the chest was performed following the standard protocol without IV contrast. RADIATION DOSE REDUCTION: This exam was performed according to the departmental dose-optimization program which includes automated exposure control, adjustment of the mA and/or kV according to patient size and/or use of iterative reconstruction technique. COMPARISON:  Chest radiograph 01/19/2023 FINDINGS: Cardiovascular: Extensive atherosclerotic calcifications aorta, common carotid arteries, minimally coronary arteries. Upper normal caliber ascending thoracic aorta 3.9 cm transverse. No pericardial effusion. Mediastinum/Nodes: Dilated esophagus containing fluid and air, questionably some food debris as well. This could reflect motility disorder, reflux, or distal esophageal obstruction. Scattered  normal sized mediastinal lymph nodes. Upper normal precarinal lymph node 10 mm short axis. No definite thoracic adenopathy. Lungs/Pleura: BILATERAL pleural effusions, partially loculated on LEFT. Patchy infiltrates RIGHT lower lobe. Compressive atelectasis in BILATERAL lower lobes. Additional small focus of infiltrate RIGHT middle lobe. No pneumothorax or mass. Upper Abdomen: Small volume perihepatic ascites. Stomach distended by food debris. Scattered soft tissue edema with poor definition of intra-abdominal fat planes. Remaining visualized upper abdomen unremarkable. Musculoskeletal: Osseous demineralization. Marked compression deformity of a lower thoracic vertebra, T9. Small sclerotic focus at mid to distal sternum question sclerotic lesion or healed fracture, favor fracture. IMPRESSION: Dilated esophagus containing fluid and air as well as stomach distended by food debris,, questionably some food debris as well, question gastroesophageal reflux or motility disorder. BILATERAL pleural effusions, partially loculated on LEFT. Patchy infiltrates RIGHT lower lobe and RIGHT middle lobe consistent with pneumonia. Small volume perihepatic ascites. Marked compression deformity of a lower thoracic vertebra, T9. Extensive atherosclerotic disease changes including coronary arteries. Small sclerotic focus at mid to distal sternum question sclerotic lesion or healed fracture, favor fracture. Aortic Atherosclerosis (ICD10-I70.0). Electronically Signed   By: Lavonia Dana M.D.   On: 01/19/2023 14:23   DG Chest Port 1 View  Result Date: 01/19/2023 CLINICAL DATA:  Fever. EXAM: PORTABLE CHEST 1 VIEW COMPARISON:  January 13, 2023. FINDINGS: The heart size and mediastinal contours are within normal limits. Increased left basilar opacity is noted concerning for worsening pneumonia or atelectasis with associated pleural effusion. Mild right basilar atelectasis or infiltrate is noted. The visualized skeletal structures are  unremarkable. IMPRESSION: Increased left basilar opacity as described above. Mild right basilar atelectasis or infiltrate is again noted. Electronically Signed   By: Marijo Conception M.D.   On: 01/19/2023 11:01             LOS: 8 days     Emeterio Reeve, DO [facility] 01/21/2023, 11:03 AM    Dictation software may have been used to generate the above note. Typos may occur and escape review in typed/dictated notes. Please contact Dr Sheppard Coil directly for clarity if needed.  Staff may message me via secure chat in [place]  but this may not receive an immediate response,  please page me for urgent matters!  If 7PM-7AM, please contact night coverage [email or web address]

## 2023-01-22 ENCOUNTER — Inpatient Hospital Stay: Payer: Medicare HMO

## 2023-01-22 DIAGNOSIS — J9601 Acute respiratory failure with hypoxia: Secondary | ICD-10-CM | POA: Diagnosis not present

## 2023-01-22 LAB — GLUCOSE, CAPILLARY
Glucose-Capillary: 123 mg/dL — ABNORMAL HIGH (ref 70–99)
Glucose-Capillary: 202 mg/dL — ABNORMAL HIGH (ref 70–99)
Glucose-Capillary: 205 mg/dL — ABNORMAL HIGH (ref 70–99)
Glucose-Capillary: 220 mg/dL — ABNORMAL HIGH (ref 70–99)
Glucose-Capillary: 96 mg/dL (ref 70–99)

## 2023-01-22 MED ORDER — INSULIN ASPART 100 UNIT/ML IJ SOLN: 0.0000 [IU] | Freq: Three times a day (TID) | INTRAMUSCULAR | 11 refills | Status: DC

## 2023-01-22 MED ORDER — AZITHROMYCIN 500 MG PO TABS: 500.0000 mg | ORAL_TABLET | Freq: Every day | ORAL | 0 refills | Status: AC

## 2023-01-22 MED ORDER — BLOOD GLUCOSE TEST STRIPS 333 VI STRP: ORAL_STRIP | 0 refills | Status: DC

## 2023-01-22 MED ORDER — CEFPODOXIME PROXETIL 100 MG PO TABS: 100.0000 mg | ORAL_TABLET | Freq: Two times a day (BID) | ORAL | 0 refills | Status: AC

## 2023-01-22 MED ORDER — INSULIN ASPART 100 UNIT/ML FLEXPEN: PEN_INJECTOR | SUBCUTANEOUS | 11 refills | Status: DC

## 2023-01-22 MED ORDER — ASCORBIC ACID 500 MG PO TABS: 500.0000 mg | ORAL_TABLET | Freq: Two times a day (BID) | ORAL | Status: DC

## 2023-01-22 NOTE — Discharge Summary (Signed)
Physician Discharge Summary   Patient: Melanie Turner MRN: [id number]  DOB: 1932-08-11   Admit:     Date of Admission: 01/13/2023 Admitted from: home   Discharge: Date of discharge: 01/22/23 Disposition: Home health Condition at discharge: poor  CODE STATUS: DNR     Discharge Physician: Emeterio Reeve, DO [facility]     PCP: Kirk Ruths, MD  Recommendations for Outpatient Follow-up:  Follow up with PCP Kirk Ruths, MD in 1-2 weeks Please obtain labs/tests: consider CXR if SOB/cough, monitor CBC, BMP in 1-2 weeks Restarting home health Son has information for hospice/palliative  Please follow up on the following pending results: none PCP AND OTHER OUTPATIENT PROVIDERS: SEE [place] PATIENT IN ADDITION TO GENERIC AVS PATIENT INFO    Discharge Instructions     Diet - low sodium heart healthy   Complete by: As directed    Discharge wound care:   Complete by: As directed    Daily routine care to pressure wounds   Increase activity slowly   Complete by: As directed          Discharge Diagnoses: Principal Problem:   Acute respiratory failure with hypoxia ([place]) Active Problems:   Sepsis ([place])   Pneumonia   Hyperglycemia due to type 2 diabetes mellitus ([place])   Benign essential HTN   Dementia ([place])   Lactic acidosis   Protein-calorie malnutrition, severe       Hospital Course: This 89 yrs old female with PMH significant for dementia, type 2 diabetes, hypertension presented in the ED 01/13/2023 with generalized weakness. History was obtained from son. Patient noted to be bedbound and nonverbal at baseline. Son reported patient was noted to be more dehydrated and weak . She had an episode of vomiting 03/14 and been feeling generally weak afterwards. He denied any fever or chills.  03/15: ED workup reveals temp. 99.6, heart rate 100, respiratory rate 20, blood pressure 160/90. She was  hypoxic requiring 2 L of supplemental oxygen. Labs include WBC 10.3, Lactic acid 4.2, Chest x-ray showed hazy opacity in the right lower lung. CT abdomen and pelvis showed multi lobar pneumonia as well as moderate stool burden. Patient is admitted for sepsis likely secondary to decubitus ulcers and multifocal pneumonia. Started ceftriaxone and azithromycin, supplemental O2  03/16: relatively stable on O2, awaiting cultures.  03/17: BCx NGx2d. IPAL note --> DNR.  03/18: SLP recs NPO d/t high aspiration risk. Palliative care saw patient.  03/19: Palliative and SLP d/w family again, goals are set. Family remains in agreement with DNR and wants to treat the treatable with plans to d/c home.  03/20: confirmed w/ son that goal is for patient to go home, we discussed hospice at home as a potential source of support, he is willing to talk to them.  03/21: (+)fever and sinus tachycardia on EKG, w/u found CXR (+)increased L basilar opacity --> CT chest pending, CBC/CMP no concerns, UA neg, BCx collected. Hospice to see patient/family. Patient improved some w/ po intake.  03/22: still febrile tachy. Pending thoracentesis but not enough fluid to draw off. Edema RUE, [place] no DVT. Palliative care spoke w/ family who now does not want hospice. Will revisit as needed.  03/23: HR and temp improved. Remains on IV abx. More alert today.  03/24: remains stable off O2, CXR on personal review some improvement, son would like to take her home today if possible and she is at her baseline and stable  for discharge  Consultants:  Palliative Care   Procedures: None       ASSESSMENT & PLAN:   Principal Problem:   Acute respiratory failure with hypoxia ([place]) Active Problems:   Sepsis ([place])   Pneumonia   Hyperglycemia due to type 2 diabetes mellitus ([facility])   Benign essential HTN   Dementia ([facility])   Lactic acidosis   Protein-calorie malnutrition, severe  Acute hypoxic respiratory failure: resolved Likely secondary to  multifocal pneumonia.  Patient does not use oxygen at baseline.   Severe sepsis POA sec. to multifocal pneumonia: Recurrent sepsis uncertain source vs SIRS, possible worsening pneumonia vs cellulitis  uncertain source, had just finished 5 days ceftriaxone + azithro for CAP but concern for persistent/worse pneumonia and pleural effusion  BCx NGx2d D/c on cephalosporin + macrolide D/c IV fluid - pt has been eating better   Pleural effusion, loculated Unable to perform thoracentesis - insufficient fluid  SpO2 check w/ VS   Type 2 diabetes: She is found to have blood sugar of 400 on presentation. Keep on ISS at home    Dementia, Advanced: Patient does have end-stage dementia with minimal function at home as well as decreased p.o. intake.  Continue Aricept, Remeron and trazodone.   SLP following Palliative care following intermittently  Family wants to treat the treatable and was open to speaking w/ hospice but later declined hospice services - goal is to take patient home    Hypertension: Resume amlodipine    Decubitus Bed ulcers: Continue wound care. Change body posture q4hrs  Lactic acidosis: resolved Likely in the setting of  multifocal pneumonia.          Discharge Instructions  Allergies as of 01/22/2023   No Known Allergies      Medication List     STOP taking these medications    clotrimazole-betamethasone cream Commonly known as: LOTRISONE   donepezil 5 MG tablet Commonly known as: ARICEPT   mirtazapine 7.5 MG tablet Commonly known as: REMERON   omeprazole 20 MG capsule Commonly known as: PRILOSEC   sitaGLIPtin 100 MG tablet Commonly known as: JANUVIA   traZODone 50 MG tablet Commonly known as: DESYREL       TAKE these medications    amLODipine 5 MG tablet Commonly known as: NORVASC Take 1 tablet (5 mg total) by mouth daily.   ascorbic acid 500 MG tablet Commonly known as: VITAMIN C Take 1 tablet (500 mg total) by mouth 2 (two)  times daily.   azithromycin 500 MG tablet Commonly known as: Zithromax Take 1 tablet (500 mg total) by mouth daily for 3 days. Take 1 tablet daily for 3 days.   blood glucose meter kit and supplies Kit Dispense based on patient and insurance preference. Use up to four times daily as directed. (FOR ICD-9 250.00, 250.01).   cefpodoxime 100 MG tablet Commonly known as: VANTIN Take 1 tablet (100 mg total) by mouth 2 (two) times daily for 5 days.   DERMACLOUD EX Apply 1 application topically 2 (two) times daily. To sacrum and buttocks for nonspecific rash.   feeding supplement Liqd Take 237 mLs by mouth 2 (two) times daily between meals.   insulin aspart 100 UNIT/ML injection Commonly known as: novoLOG Inject 0-9 Units into the skin 3 (three) times daily with meals. CBG 70 - 120: 0 units; CBG 121 - 150: 1 unit; CBG 151 - 200: 2 units; CBG 201 - 250: 3 units; CBG 251 - 300: 5 units; CBG 301 - 350:  7 units; CBG 351 - 400: 9 units   metFORMIN 500 MG 24 hr tablet Commonly known as: GLUCOPHAGE-XR Take 1,500 mg by mouth daily. With dinner.   multivitamin with minerals Tabs tablet Take 1 tablet by mouth daily.               Discharge Care Instructions  (From admission, onward)           Start     Ordered   01/22/23 0000  Discharge wound care:       Comments: Daily routine care to pressure wounds   01/22/23 1227              No Known Allergies   Subjective: pt has no complaints but is minimally verbal (baseline), son has no concerns    Discharge Exam: BP (!) 157/98 (BP Location: Left Arm)   Pulse (!) 109   Temp 98.2 F (36.8 C) (Oral)   Resp 18   Ht 5\' 2"  (1.575 m)   Wt 42 kg   SpO2 100%   BMI 16.94 kg/m  General: Pt is alert, awake, not in acute distress Cardiovascular: RRR, S1/S2 +, no rubs, no gallops Respiratory: CTA bilaterally but poor inspiratory effort  Abdominal: Soft, NT, ND, bowel sounds + Extremities: no edema, no cyanosis     The  results of significant diagnostics from this hospitalization (including imaging, microbiology, ancillary and laboratory) are listed below for reference.     Microbiology: Recent Results (from the past 240 hour(s))  Blood culture (routine x 2)     Status: None   Collection Time: 01/13/23  3:42 AM   Specimen: BLOOD  Result Value Ref Range Status   Specimen Description   Final    BLOOD BLOOD RIGHT ARM Performed at [facility], [address], [place], [place] [address]    Special Requests   Final    BOTTLES DRAWN AEROBIC AND ANAEROBIC Blood Culture adequate volume Performed at [facility], [address] [place], [place] [address]    Culture   Final    NO GROWTH 5 DAYS Performed at [facility], [place] [address], [place], [place] [address]    Report Status 01/18/2023 FINAL  Final  Resp panel by RT-PCR (RSV, Flu A&B, Covid) Anterior Nasal Swab     Status: None   Collection Time: 01/13/23  3:45 AM   Specimen: Anterior Nasal Swab  Result Value Ref Range Status   SARS Coronavirus 2 by RT PCR NEGATIVE NEGATIVE Final    Comment: (NOTE) SARS-CoV-2 target nucleic acids are NOT DETECTED.  The SARS-CoV-2 RNA is generally detectable in upper respiratory specimens during the acute phase of infection. The lowest concentration of SARS-CoV-2 viral copies this assay can detect is 138 copies/mL. A negative result does not preclude SARS-Cov-2 infection and should not be used as the sole basis for treatment or other patient management decisions. A negative result may occur with  improper specimen collection/handling, submission of specimen other than nasopharyngeal swab, presence of viral mutation(s) within the areas targeted by this assay, and inadequate number of viral copies(<138 copies/mL). A negative result must be combined with clinical observations, patient history, and epidemiological information. The expected result is Negative.  Fact Sheet for Patients:   EntrepreneurPulse.com.au  Fact Sheet for Healthcare Providers:  IncredibleEmployment.be  This test is no t yet approved or cleared by the [place] FDA and  has been authorized for detection and/or diagnosis of SARS-CoV-2 by FDA under an Emergency  Use Authorization (EUA). This EUA will remain  in effect (meaning this test can be used) for the duration of the COVID-19 declaration under Section 564(b)(1) of the Act, 21 U.S.C.section 360bbb-3(b)(1), unless the authorization is terminated  or revoked sooner.       Influenza A by PCR NEGATIVE NEGATIVE Final   Influenza B by PCR NEGATIVE NEGATIVE Final    Comment: (NOTE) The Xpert Xpress SARS-CoV-2/FLU/RSV plus assay is intended as an aid in the diagnosis of influenza from Nasopharyngeal swab specimens and should not be used as a sole basis for treatment. Nasal washings and aspirates are unacceptable for Xpert Xpress SARS-CoV-2/FLU/RSV testing.  Fact Sheet for Patients: EntrepreneurPulse.com.au  Fact Sheet for Healthcare Providers: IncredibleEmployment.be  This test is not yet approved or cleared by the [place] FDA and has been authorized for detection and/or diagnosis of SARS-CoV-2 by FDA under an Emergency Use Authorization (EUA). This EUA will remain in effect (meaning this test can be used) for the duration of the COVID-19 declaration under Section 564(b)(1) of the Act, 21 U.S.C. section 360bbb-3(b)(1), unless the authorization is terminated or revoked.     Resp Syncytial Virus by PCR NEGATIVE NEGATIVE Final    Comment: (NOTE) Fact Sheet for Patients: EntrepreneurPulse.com.au  Fact Sheet for Healthcare Providers: IncredibleEmployment.be  This test is not yet approved or cleared by the [place] FDA and has been authorized for detection and/or diagnosis of SARS-CoV-2 by FDA under an Emergency Use  Authorization (EUA). This EUA will remain in effect (meaning this test can be used) for the duration of the COVID-19 declaration under Section 564(b)(1) of the Act, 21 U.S.C. section 360bbb-3(b)(1), unless the authorization is terminated or revoked.  Performed at [facility], [address] [place], [place] [address]   Blood culture (routine x 2)     Status: None   Collection Time: 01/13/23  9:08 AM   Specimen: BLOOD  Result Value Ref Range Status   Specimen Description   Final    BLOOD BLOOD LEFT HAND Performed at [facility], [address] [place], [place] [address]    Special Requests   Final    BOTTLES DRAWN AEROBIC AND ANAEROBIC Blood Culture results may not be optimal due to an inadequate volume of blood received in culture bottles Performed at [facility], [address] [place], [place] [address]    Culture   Final    NO GROWTH 5 DAYS Performed at [facility], [place] [address] [place], [place] [address]    Report Status 01/18/2023 FINAL  Final  Urine Culture (for pregnant, neutropenic or urologic patients or patients with an indwelling urinary catheter)     Status: None   Collection Time: 01/19/23 10:03 AM   Specimen: Urine, Clean Catch  Result Value Ref Range Status   Specimen Description   Final    URINE, CLEAN CATCH Performed at [facility], [address] [place], [place] [address]    Special Requests   Final    NONE Performed at [facility], [address], [place], [place] [address]    Culture   Final    NO GROWTH Performed at [facility], [place] [address], [place], [place] [address]    Report Status 01/20/2023 FINAL  Final  Culture, blood (Routine X 2) w Reflex to ID Panel     Status: None (Preliminary result)   Collection Time: 01/19/23 11:19 AM   Specimen: BLOOD  Result Value Ref Range  Status   Specimen Description BLOOD BLOOD LEFT HAND  Final   Special Requests   Final    BOTTLES  DRAWN AEROBIC AND ANAEROBIC Blood Culture adequate volume   Culture   Final    NO GROWTH 3 DAYS Performed at [facility] [place], [address] [place], [place] [address]    Report Status PENDING  Incomplete  Culture, blood (Routine X 2) w Reflex to ID Panel     Status: None (Preliminary result)   Collection Time: 01/19/23  1:01 PM   Specimen: BLOOD  Result Value Ref Range Status   Specimen Description BLOOD BLOOD LEFT HAND  Final   Special Requests   Final    BOTTLES DRAWN AEROBIC AND ANAEROBIC Blood Culture adequate volume   Culture   Final    NO GROWTH 3 DAYS Performed at [facility], [address] [place], [place] [address]    Report Status PENDING  Incomplete  MRSA Next Gen by PCR, Nasal     Status: None   Collection Time: 01/19/23  2:30 PM   Specimen: Nasal Mucosa; Nasal Swab  Result Value Ref Range Status   MRSA by PCR Next Gen NOT DETECTED NOT DETECTED Final    Comment: (NOTE) The GeneXpert MRSA Assay (FDA approved for NASAL specimens only), is one component of a comprehensive MRSA colonization surveillance program. It is not intended to diagnose MRSA infection nor to guide or monitor treatment for MRSA infections. Test performance is not FDA approved in patients less than 28 years old. Performed at [facility], [address] [place], [place] [address]      Labs: BNP (last 3 results) No results for input(s): "BNP" in the last 8760 hours. Basic Metabolic Panel: Recent Labs  Lab 01/17/23 0626 01/18/23 0555 01/19/23 1117 01/20/23 0530 01/21/23 1202  NA 136 135 134*  --  134*  K 2.8* 3.8 4.0  --  4.3  CL 100 104 100  --  99  CO2 27 28 27   --  30  GLUCOSE 169* 102* 237*  --  238*  BUN 7* 10 14  --  17  CREATININE 0.36* <0.30* 0.38* 0.36* 0.50  CALCIUM 8.5* 8.4* 8.5*  --  8.5*  MG 1.4* 1.8  --   --  1.7  PHOS 2.2* 2.7  --   --   --    Liver Function Tests: Recent Labs  Lab 01/19/23 1117 01/21/23 1202  AST 31 30  ALT 19 26   ALKPHOS 53 51  BILITOT 0.5 0.4  PROT 6.4* 6.4*  ALBUMIN 2.4* 2.3*   No results for input(s): "LIPASE", "AMYLASE" in the last 168 hours. No results for input(s): "AMMONIA" in the last 168 hours. CBC: Recent Labs  Lab 01/17/23 0626 01/18/23 0555 01/19/23 1117 01/21/23 1202  WBC 5.4 5.0 6.7 5.0  HGB 10.7* 9.9* 9.8* 9.7*  HCT 31.9* 30.1* 29.9* 30.2*  MCV 83.9 84.8 86.7 86.8  PLT 286 286 327 376   Cardiac Enzymes: No results for input(s): "CKTOTAL", "CKMB", "CKMBINDEX", "TROPONINI" in the last 168 hours. BNP: Invalid input(s): "POCBNP" CBG: Recent Labs  Lab 01/21/23 1633 01/21/23 1937 01/22/23 0008 01/22/23 0505 01/22/23 0753  GLUCAP 249* 189* 205* 96 123*   D-Dimer No results for input(s): "DDIMER" in the last 72 hours. Hgb A1c No results for input(s): "HGBA1C" in the last 72 hours. Lipid Profile No results for input(s): "CHOL", "HDL", "LDLCALC", "TRIG", "CHOLHDL", "LDLDIRECT" in the last 72 hours. Thyroid function studies No  results for input(s): "TSH", "T4TOTAL", "T3FREE", "THYROIDAB" in the last 72 hours.  Invalid input(s): "FREET3" Anemia work up No results for input(s): "VITAMINB12", "FOLATE", "FERRITIN", "TIBC", "IRON", "RETICCTPCT" in the last 72 hours. Urinalysis    Component Value Date/Time   COLORURINE YELLOW (A) 01/19/2023 1003   APPEARANCEUR CLEAR 01/19/2023 1003   APPEARANCEUR Clear 06/30/2014 1501   LABSPEC 1.020 01/19/2023 1003   LABSPEC 1.011 06/30/2014 1501   PHURINE 6.5 01/19/2023 1003   GLUCOSEU >1,000 (A) 01/19/2023 1003   GLUCOSEU Negative 06/30/2014 1501   HGBUR NEGATIVE 01/19/2023 1003   BILIRUBINUR NEGATIVE 01/19/2023 1003   BILIRUBINUR Negative 06/30/2014 1501   KETONESUR NEGATIVE 01/19/2023 1003   PROTEINUR NEGATIVE 01/19/2023 1003   NITRITE NEGATIVE 01/19/2023 1003   LEUKOCYTESUR NEGATIVE 01/19/2023 1003   LEUKOCYTESUR Trace 06/30/2014 1501   Sepsis Labs Recent Labs  Lab 01/17/23 0626 01/18/23 0555 01/19/23 1117  01/21/23 1202  WBC 5.4 5.0 6.7 5.0   Microbiology Recent Results (from the past 240 hour(s))  Blood culture (routine x 2)     Status: None   Collection Time: 01/13/23  3:42 AM   Specimen: BLOOD  Result Value Ref Range Status   Specimen Description   Final    BLOOD BLOOD RIGHT ARM Performed at [facility], [address] [place], [place] [address]    Special Requests   Final    BOTTLES DRAWN AEROBIC AND ANAEROBIC Blood Culture adequate volume Performed at [facility], [address]  [place], [place] [address]    Culture   Final    NO GROWTH 5 DAYS Performed at [facility], [place] [address], [place], [place] [address]    Report Status 01/18/2023 FINAL  Final  Resp panel by RT-PCR (RSV, Flu A&B, Covid) Anterior Nasal Swab     Status: None   Collection Time: 01/13/23  3:45 AM   Specimen: Anterior Nasal Swab  Result Value Ref Range Status   SARS Coronavirus 2 by RT PCR NEGATIVE NEGATIVE Final    Comment: (NOTE) SARS-CoV-2 target nucleic acids are NOT DETECTED.  The SARS-CoV-2 RNA is generally detectable in upper respiratory specimens during the acute phase of infection. The lowest concentration of SARS-CoV-2 viral copies this assay can detect is 138 copies/mL. A negative result does not preclude SARS-Cov-2 infection and should not be used as the sole basis for treatment or other patient management decisions. A negative result may occur with  improper specimen collection/handling, submission of specimen other than nasopharyngeal swab, presence of viral mutation(s) within the areas targeted by this assay, and inadequate number of viral copies(<138 copies/mL). A negative result must be combined with clinical observations, patient history, and epidemiological information. The expected result is Negative.  Fact Sheet for Patients:  EntrepreneurPulse.com.au  Fact Sheet for Healthcare Providers:   IncredibleEmployment.be  This test is no t yet approved or cleared by the [place] FDA and  has been authorized for detection and/or diagnosis of SARS-CoV-2 by FDA under an Emergency Use Authorization (EUA). This EUA will remain  in effect (meaning this test can be used) for the duration of the COVID-19 declaration under Section 564(b)(1) of the Act, 21 U.S.C.section 360bbb-3(b)(1), unless the authorization is terminated  or revoked sooner.       Influenza A by PCR NEGATIVE NEGATIVE Final   Influenza B by PCR NEGATIVE NEGATIVE Final    Comment: (NOTE) The Xpert Xpress SARS-CoV-2/FLU/RSV plus assay is intended as an aid in the diagnosis of influenza from Nasopharyngeal swab  specimens and should not be used as a sole basis for treatment. Nasal washings and aspirates are unacceptable for Xpert Xpress SARS-CoV-2/FLU/RSV testing.  Fact Sheet for Patients: EntrepreneurPulse.com.au  Fact Sheet for Healthcare Providers: IncredibleEmployment.be  This test is not yet approved or cleared by the [place] FDA and has been authorized for detection and/or diagnosis of SARS-CoV-2 by FDA under an Emergency Use Authorization (EUA). This EUA will remain in effect (meaning this test can be used) for the duration of the COVID-19 declaration under Section 564(b)(1) of the Act, 21 U.S.C. section 360bbb-3(b)(1), unless the authorization is terminated or revoked.     Resp Syncytial Virus by PCR NEGATIVE NEGATIVE Final    Comment: (NOTE) Fact Sheet for Patients: EntrepreneurPulse.com.au  Fact Sheet for Healthcare Providers: IncredibleEmployment.be  This test is not yet approved or cleared by the [place] FDA and has been authorized for detection and/or diagnosis of SARS-CoV-2 by FDA under an Emergency Use Authorization (EUA). This EUA will remain in effect (meaning this test can be used) for  the duration of the COVID-19 declaration under Section 564(b)(1) of the Act, 21 U.S.C. section 360bbb-3(b)(1), unless the authorization is terminated or revoked.  Performed at [facility], [address] [place], [place] [address]   Blood culture (routine x 2)     Status: None   Collection Time: 01/13/23  9:08 AM   Specimen: BLOOD  Result Value Ref Range Status   Specimen Description   Final    BLOOD BLOOD LEFT HAND Performed at [facility], [address] [place], [place] [address]    Special Requests   Final    BOTTLES DRAWN AEROBIC AND ANAEROBIC Blood Culture results may not be optimal due to an inadequate volume of blood received in culture bottles Performed at [facility], [address], [place], [place] [address]    Culture   Final    NO GROWTH 5 DAYS Performed at [facility], [place] [address] [place], [place] [address]    Report Status 01/18/2023 FINAL  Final  Urine Culture (for pregnant, neutropenic or urologic patients or patients with an indwelling urinary catheter)     Status: None   Collection Time: 01/19/23 10:03 AM   Specimen: Urine, Clean Catch  Result Value Ref Range Status   Specimen Description   Final    URINE, CLEAN CATCH Performed at [facility], [address], [place], [place] [address]    Special Requests   Final    NONE Performed at [facility], [address] [place], [place] [address]    Culture   Final    NO GROWTH Performed at [facility], [place] [address], [place], [place] [address]    Report Status 01/20/2023 FINAL  Final  Culture, blood (Routine X 2) w Reflex to ID Panel     Status: None (Preliminary result)   Collection Time: 01/19/23 11:19 AM   Specimen: BLOOD  Result Value Ref Range Status   Specimen Description BLOOD BLOOD LEFT HAND  Final   Special Requests   Final    BOTTLES DRAWN AEROBIC AND ANAEROBIC Blood Culture adequate volume   Culture   Final    NO  GROWTH 3 DAYS Performed at [facility], [address] [place], [place] [address]    Report Status PENDING  Incomplete  Culture, blood (Routine X 2) w Reflex to ID Panel     Status: None (Preliminary result)   Collection Time:  01/19/23  1:01 PM   Specimen: BLOOD  Result Value Ref Range Status   Specimen Description BLOOD BLOOD LEFT HAND  Final   Special Requests   Final    BOTTLES DRAWN AEROBIC AND ANAEROBIC Blood Culture adequate volume   Culture   Final    NO GROWTH 3 DAYS Performed at [facility], [address], [place], [place] [address]    Report Status PENDING  Incomplete  MRSA Next Gen by PCR, Nasal     Status: None   Collection Time: 01/19/23  2:30 PM   Specimen: Nasal Mucosa; Nasal Swab  Result Value Ref Range Status   MRSA by PCR Next Gen NOT DETECTED NOT DETECTED Final    Comment: (NOTE) The GeneXpert MRSA Assay (FDA approved for NASAL specimens only), is one component of a comprehensive MRSA colonization surveillance program. It is not intended to diagnose MRSA infection nor to guide or monitor treatment for MRSA infections. Test performance is not FDA approved in patients less than 56 years old. Performed at [facility], [address], [place], [place] [address]    Imaging CT ABDOMEN PELVIS W CONTRAST  Result Date: 01/13/2023 CLINICAL DATA:  Abdominal pain.  Weakness and emesis. EXAM: CT ABDOMEN AND PELVIS WITH CONTRAST TECHNIQUE: Multidetector CT imaging of the abdomen and pelvis was performed using the standard protocol following bolus administration of intravenous contrast. RADIATION DOSE REDUCTION: This exam was performed according to the departmental dose-optimization program which includes automated exposure control, adjustment of the mA and/or kV according to patient size and/or use of iterative reconstruction technique. CONTRAST:  37mL OMNIPAQUE IOHEXOL 300 MG/ML  SOLN COMPARISON:  11/24/2020 FINDINGS: Lower chest: No pleural  fluid. Airspace disease noted within the posterior and inferior right middle lobe and throughout the right lower lobe concerning for multifocal pneumonia. Atelectasis versus scarring identified in the posteromedial left base. Hepatobiliary: No focal liver abnormality is seen. No gallstones, gallbladder wall thickening, or biliary dilatation. Pancreas: Unremarkable. No pancreatic ductal dilatation or surrounding inflammatory changes. Spleen: Normal in size without focal abnormality. Adrenals/Urinary Tract: Normal adrenal glands. Simple cyst within upper pole of the right kidney measuring 1.8 cm. No follow-up imaging recommended. No nephrolithiasis or hydronephrosis identified bilaterally. Urinary bladder is unremarkable Stomach/Bowel: Stomach appears distended containing food debris and fluid. No signs of small bowel wall thickening, inflammation or distension. The appendix is not confidently identified separate from the right lower quadrant bowel loops. No secondary signs of acute appendicitis identified. There is a moderate stool burden throughout the colon and rectum. Vascular/Lymphatic: Aortic atherosclerosis. No aneurysm. No signs of abdominopelvic adenopathy. Reproductive: Uterus is either atrophic or surgically absent. No adnexal mass identified. Other: No free fluid or fluid collections. No signs of pneumoperitoneum. Musculoskeletal: Osteopenia. Status post right hip arthroplasty. No acute findings. IMPRESSION: 1. No acute findings within the abdomen or pelvis. 2. Airspace disease within the posterior and inferior right middle lobe and throughout the right lower lobe concerning for multifocal pneumonia. 3. Moderate stool burden throughout the colon and rectum. Correlate for any clinical signs or symptoms of constipation. 4.  Aortic Atherosclerosis (ICD10-I70.0). Electronically Signed   By: Kerby Moors M.D.   On: 01/13/2023 05:26   DG Chest Port 1 View  Result Date: 01/13/2023 CLINICAL DATA:  Weakness  and emesis EXAM: PORTABLE CHEST 1 VIEW COMPARISON:  Radiograph 03/17/2021 FINDINGS: New hazy airspace opacity in the right lower lung suspicious for pneumonia. Left basilar atelectasis/scarring. No pleural effusion or pneumothorax. Stable cardiomediastinal silhouette. Aortic atherosclerotic  calcification. IMPRESSION: New hazy airspace opacity in the right lower lung suspicious for pneumonia. Electronically Signed   By: Placido Sou M.D.   On: 01/13/2023 03:33      Time coordinating discharge: over 30 minutes  SIGNED:  Emeterio Reeve DO [facility]

## 2023-01-22 NOTE — TOC Transition Note (Signed)
Transition of Care [facility]) - CM/SW Discharge Note   Patient Details  Name: Melanie Turner MRN: [id number] Date of Birth: June 24, 1932  [facility] CM/SW Contact:  Loreta [address], [place] Phone Number: 01/22/2023, 12:53 PM   Clinical Narrative:     CSW spoke with pt's son concerning [place] recommendation by MD, pt's son declined [place] at this time, states he takes care of pt and doesn't need any assistance in the home at this time.         Patient Goals and CMS Choice      Discharge Placement                         Discharge Plan and Services Additional resources added to the After Visit Summary for                                       Social Determinants of Health (SDOH) Interventions SDOH Screenings   Food Insecurity: Patient Unable To Answer (01/13/2023)  Transportation Needs: Patient Unable To Answer (01/13/2023)  Utilities: Patient Unable To Answer (01/13/2023)  Tobacco Use: Low Risk  (01/13/2023)     Readmission Risk Interventions     No data to display

## 2023-01-22 NOTE — Inpatient Diabetes Management (Signed)
Inpatient Diabetes Program Recommendations  AACE/ADA: New Consensus Statement on Inpatient Glycemic Control (2015)  Target Ranges:  Prepandial:   less than 140 mg/dL      Peak postprandial:   less than 180 mg/dL (1-2 hours)      Critically ill patients:  140 - 180 mg/dL   Lab Results  Component Value Date   GLUCAP 220 (H) 01/22/2023   HGBA1C 10.7 (H) 01/13/2023    Review of Glycemic Control  Diabetes history: *** Outpatient Diabetes medications: *** Current orders for Inpatient glycemic control: ***  Inpatient Diabetes Program Recommendations:

## 2023-01-22 NOTE — Plan of Care (Signed)
  Problem: Activity: Goal: Ability to tolerate increased activity will improve Outcome: Not Applicable   Bedbound at baseline

## 2023-01-22 NOTE — Progress Notes (Signed)
Patient discharged home with son. All belongings sent home with son. Patient to be discharged with insulin pen, Patients son (and caregiver) was taught how to use and was able to perform return demonstrate back to this nurse. All questions answered and peripheral IV removed.

## 2023-01-24 LAB — CULTURE, BLOOD (ROUTINE X 2)
Culture: NO GROWTH
Culture: NO GROWTH
Special Requests: ADEQUATE
Special Requests: ADEQUATE

## 2023-02-23 DIAGNOSIS — E441 Mild protein-calorie malnutrition: Secondary | ICD-10-CM | POA: Diagnosis not present

## 2023-02-23 DIAGNOSIS — F0390 Unspecified dementia without behavioral disturbance: Secondary | ICD-10-CM | POA: Diagnosis not present

## 2023-02-23 DIAGNOSIS — N182 Chronic kidney disease, stage 2 (mild): Secondary | ICD-10-CM | POA: Diagnosis not present

## 2023-02-23 DIAGNOSIS — Z0000 Encounter for general adult medical examination without abnormal findings: Secondary | ICD-10-CM | POA: Diagnosis not present

## 2023-02-23 DIAGNOSIS — E1122 Type 2 diabetes mellitus with diabetic chronic kidney disease: Secondary | ICD-10-CM | POA: Diagnosis not present

## 2023-02-23 DIAGNOSIS — I129 Hypertensive chronic kidney disease with stage 1 through stage 4 chronic kidney disease, or unspecified chronic kidney disease: Secondary | ICD-10-CM | POA: Diagnosis not present

## 2023-02-23 DIAGNOSIS — Z993 Dependence on wheelchair: Secondary | ICD-10-CM | POA: Diagnosis not present

## 2023-07-02 DEATH — deceased
